# Patient Record
Sex: Male | Born: 1995 | Race: White | Hispanic: No | Marital: Single | Smoking: Current every day smoker
Health system: Southern US, Community
[De-identification: ages and names within clinical notes are randomized; demographics above are authoritative.]

## PROBLEM LIST (undated history)

## (undated) DIAGNOSIS — J329 Chronic sinusitis, unspecified: Secondary | ICD-10-CM

## (undated) DIAGNOSIS — Z8679 Personal history of other diseases of the circulatory system: Secondary | ICD-10-CM

## (undated) DIAGNOSIS — I214 Non-ST elevation (NSTEMI) myocardial infarction: Secondary | ICD-10-CM

## (undated) DIAGNOSIS — J189 Pneumonia, unspecified organism: Secondary | ICD-10-CM

## (undated) DIAGNOSIS — I2690 Septic pulmonary embolism without acute cor pulmonale: Secondary | ICD-10-CM

## (undated) DIAGNOSIS — B1910 Unspecified viral hepatitis B without hepatic coma: Secondary | ICD-10-CM

## (undated) DIAGNOSIS — F419 Anxiety disorder, unspecified: Secondary | ICD-10-CM

## (undated) DIAGNOSIS — I330 Acute and subacute infective endocarditis: Secondary | ICD-10-CM

## (undated) DIAGNOSIS — F1990 Other psychoactive substance use, unspecified, uncomplicated: Secondary | ICD-10-CM

## (undated) DIAGNOSIS — I071 Rheumatic tricuspid insufficiency: Secondary | ICD-10-CM

## (undated) DIAGNOSIS — F1920 Other psychoactive substance dependence, uncomplicated: Secondary | ICD-10-CM

## (undated) HISTORY — DX: Unspecified viral hepatitis B without hepatic coma: B19.10

## (undated) HISTORY — DX: Anxiety disorder, unspecified: F41.9

## (undated) HISTORY — DX: Other psychoactive substance dependence, uncomplicated: F19.20

---
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
## 2013-01-17 ENCOUNTER — Ambulatory Visit (HOSPITAL_COMMUNITY): Payer: Self-pay | Admitting: Psychiatry

## 2013-01-19 ENCOUNTER — Encounter (HOSPITAL_COMMUNITY): Payer: Self-pay

## 2013-04-15 ENCOUNTER — Encounter (HOSPITAL_COMMUNITY): Payer: Self-pay | Admitting: Emergency Medicine

## 2013-04-15 ENCOUNTER — Observation Stay (HOSPITAL_COMMUNITY)
Admission: EM | Admit: 2013-04-15 | Discharge: 2013-04-16 | Payer: 59 | Attending: Family Medicine | Admitting: Family Medicine

## 2013-04-15 DIAGNOSIS — T50901A Poisoning by unspecified drugs, medicaments and biological substances, accidental (unintentional), initial encounter: Secondary | ICD-10-CM | POA: Diagnosis present

## 2013-04-15 DIAGNOSIS — T50911A Poisoning by multiple unspecified drugs, medicaments and biological substances, accidental (unintentional), initial encounter: Secondary | ICD-10-CM

## 2013-04-15 DIAGNOSIS — B1910 Unspecified viral hepatitis B without hepatic coma: Secondary | ICD-10-CM | POA: Insufficient documentation

## 2013-04-15 DIAGNOSIS — F1410 Cocaine abuse, uncomplicated: Secondary | ICD-10-CM | POA: Insufficient documentation

## 2013-04-15 DIAGNOSIS — R092 Respiratory arrest: Secondary | ICD-10-CM | POA: Insufficient documentation

## 2013-04-15 DIAGNOSIS — T405X1A Poisoning by cocaine, accidental (unintentional), initial encounter: Secondary | ICD-10-CM | POA: Insufficient documentation

## 2013-04-15 DIAGNOSIS — N179 Acute kidney failure, unspecified: Secondary | ICD-10-CM | POA: Insufficient documentation

## 2013-04-15 DIAGNOSIS — T43601A Poisoning by unspecified psychostimulants, accidental (unintentional), initial encounter: Secondary | ICD-10-CM | POA: Insufficient documentation

## 2013-04-15 DIAGNOSIS — F1110 Opioid abuse, uncomplicated: Secondary | ICD-10-CM | POA: Insufficient documentation

## 2013-04-15 DIAGNOSIS — T401X4A Poisoning by heroin, undetermined, initial encounter: Principal | ICD-10-CM | POA: Insufficient documentation

## 2013-04-15 DIAGNOSIS — T401X1A Poisoning by heroin, accidental (unintentional), initial encounter: Secondary | ICD-10-CM | POA: Insufficient documentation

## 2013-04-15 DIAGNOSIS — F411 Generalized anxiety disorder: Secondary | ICD-10-CM | POA: Insufficient documentation

## 2013-04-15 LAB — POCT I-STAT, CHEM 8
BUN: 9 mg/dL (ref 6–23)
Calcium, Ion: 1.16 mmol/L (ref 1.12–1.23)
Chloride: 97 meq/L (ref 96–112)
Creatinine, Ser: 1.5 mg/dL — ABNORMAL HIGH (ref 0.50–1.35)
Glucose, Bld: 161 mg/dL — ABNORMAL HIGH (ref 70–99)
HCT: 52 % (ref 39.0–52.0)
Hemoglobin: 17.7 g/dL — ABNORMAL HIGH (ref 13.0–17.0)
Potassium: 4.5 meq/L (ref 3.7–5.3)
Sodium: 137 meq/L (ref 137–147)
TCO2: 26 mmol/L (ref 0–100)

## 2013-04-15 LAB — CBC
HCT: 45.5 % (ref 39.0–52.0)
Hemoglobin: 16.5 g/dL (ref 13.0–17.0)
MCH: 32.5 pg (ref 26.0–34.0)
MCHC: 36.3 g/dL — AB (ref 30.0–36.0)
MCV: 89.6 fL (ref 78.0–100.0)
PLATELETS: 269 10*3/uL (ref 150–400)
RBC: 5.08 MIL/uL (ref 4.22–5.81)
RDW: 12.3 % (ref 11.5–15.5)
WBC: 24.9 10*3/uL — ABNORMAL HIGH (ref 4.0–10.5)

## 2013-04-15 MED ORDER — NALOXONE HCL 0.4 MG/ML IJ SOLN: 0.4000 mg | INTRAMUSCULAR | Status: DC | PRN

## 2013-04-15 MED ORDER — SODIUM CHLORIDE 0.9 % IV SOLN
INTRAVENOUS | Status: DC
Start: 2013-04-15 — End: 2013-04-16
  Administered 2013-04-16 (×2): via INTRAVENOUS

## 2013-04-15 NOTE — ED Notes (Signed)
gpd at the bedside.  His parents are coming back to see him.  He reports that he took 2mg  xanax

## 2013-04-15 NOTE — ED Notes (Signed)
The pt arrived by gems from a hotel room where the pt had overdosed on heroine and cocaine that he quote SHOT UP.  When ems arrived the pt  Had a resp arrest.  He was given narcan iv and the pt  arroused immediately. On arrival here the pt is alert but sleepy. Answers questions.  White material around his mouth ??? ...  The pt reports that he shoots up 2 times a week. He also took a xanax po unknown  Dosage.  He FEELS HIGH.   cbg 313 on the scene

## 2013-04-15 NOTE — ED Provider Notes (Signed)
CSN: [id number]     Arrival date & time 04/15/13  2308 History   First MD Initiated Contact with Patient 04/15/13 2317     Chief Complaint  Patient presents with  . drug overdose    (Consider location/radiation/quality/duration/timing/severity/associated sxs/prior Treatment) HPI Hx per EMS - found unresponsive in hotel room, girlfriend had called 911 after he locked her out.  He was found in resp arrest, given narcan and came to. He admitted to cocaine, heroin injection, xanax and marijuana.  He vomited and now states he still feels high. He declines any SI. He declines any help with drugs. Family bedside.  Past Medical History  Diagnosis Date  . Sinusitis   . Anxiety   . Hepatitis B    No past surgical history on file. History reviewed. No pertinent family history. History  Substance Use Topics  . Smoking status: Current Every Day Smoker -- 0.50 packs/day  . Smokeless tobacco: Never Used  . Alcohol Use: No    Review of Systems  Constitutional: Negative for fever and chills.  Respiratory: Negative for shortness of breath.   Cardiovascular: Negative for chest pain.  Gastrointestinal: Negative for abdominal pain.  Genitourinary: Negative for flank pain.  Musculoskeletal: Negative for back pain.  Skin: Negative for rash.  Neurological: Negative for seizures and weakness.  Psychiatric/Behavioral: Negative for self-injury.  All other systems reviewed and are negative.    Allergies  Penicillins and Sulfa antibiotics  Home Medications   Current Outpatient Rx  Name  Route  Sig  Dispense  Refill  . triamcinolone cream (KENALOG) 0.1 %   Topical   Apply topically 2 (two) times daily.   30 g   0    BP 94/70  Pulse 100  Temp(Src) 99 F (37.2 C) (Oral)  Resp 12  SpO2 99% Physical Exam  Constitutional: He appears well-developed and well-nourished.  HENT:  Head: Normocephalic and atraumatic.  Eyes: EOM are normal. Pupils are equal, round, and reactive to light.  Neck:  Neck supple.  Cardiovascular: Normal rate, regular rhythm and intact distal pulses.   Pulmonary/Chest: Effort normal and breath sounds normal. No respiratory distress.  Abdominal: Soft. There is no tenderness.  Musculoskeletal: Normal range of motion. He exhibits no edema and no tenderness.  Neurological:  Awake, alert, answers questions appropriately, somewhat cooperative  Skin: Skin is warm and dry.    ED Course  Procedures (including critical care time) Labs Review Labs Reviewed  CBC - Abnormal; Notable for the following:    WBC 24.9 (*)    MCHC 36.3 (*)    All other components within normal limits  POCT I-STAT, CHEM 8 - Abnormal; Notable for the following:    Creatinine, Ser 1.50 (*)    Glucose, Bld 161 (*)    Hemoglobin 17.7 (*)    All other components within normal limits  URINE RAPID DRUG SCREEN (HOSP PERFORMED)    EKG Interpretation    Date/Time:  Sunday April 15 2013 23:13:03 EST Ventricular Rate:  112 PR Interval:  149 QRS Duration: 101 QT Interval:  371 QTC Calculation: 506 R Axis:   101 Text Interpretation:  Sinus tachycardia Nonspecific ST abnormality Prolonged QT interval Abnormal ECG No old tracing to compare Confirmed by Nicey Krah  MD, Shamone Winzer ([id number]) on 04/16/2013 12:04:02 AM           IV fluids. Patient remains sleepy, Narcan drip initiated. Medicine consulted - family practice resident evaluated the patient and he is now responsive to verbal stimuli, protecting his  airway. We agree that he no longer needs Narcan drip and can be admitted to step down unit.   MDM  Diagnosis: Drug overdose, polysubstance abuse, ARI  Presented as respiratory arrest reversed with Narcan Evaluated with EKG, labs are reviewed as above. UDS noted. IV fluids provided Admits to SDU   Teressa Lower, MD 04/17/13 [phone]

## 2013-04-16 ENCOUNTER — Encounter (HOSPITAL_COMMUNITY): Payer: Self-pay | Admitting: *Deleted

## 2013-04-16 DIAGNOSIS — T50901A Poisoning by unspecified drugs, medicaments and biological substances, accidental (unintentional), initial encounter: Secondary | ICD-10-CM

## 2013-04-16 LAB — COMPREHENSIVE METABOLIC PANEL
ALBUMIN: 3.7 g/dL (ref 3.5–5.2)
ALT: 62 U/L — ABNORMAL HIGH (ref 0–53)
AST: 67 U/L — ABNORMAL HIGH (ref 0–37)
Alkaline Phosphatase: 56 U/L (ref 39–117)
BILIRUBIN TOTAL: 0.2 mg/dL — AB (ref 0.3–1.2)
BUN: 8 mg/dL (ref 6–23)
CHLORIDE: 101 meq/L (ref 96–112)
CO2: 22 mEq/L (ref 19–32)
CREATININE: 1.15 mg/dL (ref 0.50–1.35)
Calcium: 8 mg/dL — ABNORMAL LOW (ref 8.4–10.5)
GFR calc non Af Amer: >90 mL/min (ref >90)
GLUCOSE: 84 mg/dL (ref 70–99)
Potassium: 3.9 mEq/L (ref 3.7–5.3)
Sodium: 137 mEq/L (ref 137–147)
Total Protein: 6.3 g/dL (ref 6.0–8.3)

## 2013-04-16 LAB — URINALYSIS, ROUTINE W REFLEX MICROSCOPIC
Bilirubin Urine: NEGATIVE
Glucose, UA: 500 mg/dL — AB
Ketones, ur: NEGATIVE mg/dL
Leukocytes, UA: NEGATIVE
NITRITE: NEGATIVE
Protein, ur: NEGATIVE mg/dL
Specific Gravity, Urine: 1.01 (ref 1.005–1.030)
Urobilinogen, UA: 0.2 mg/dL (ref 0.0–1.0)
pH: 6 (ref 5.0–8.0)

## 2013-04-16 LAB — ETHANOL: Alcohol, Ethyl (B): <11 mg/dL (ref 0–11)

## 2013-04-16 LAB — MRSA PCR SCREENING: MRSA by PCR: NEGATIVE

## 2013-04-16 LAB — RAPID URINE DRUG SCREEN, HOSP PERFORMED
AMPHETAMINES: NOT DETECTED
Barbiturates: NOT DETECTED
Benzodiazepines: POSITIVE — AB
COCAINE: POSITIVE — AB
OPIATES: NOT DETECTED
TETRAHYDROCANNABINOL: POSITIVE — AB

## 2013-04-16 LAB — URINE MICROSCOPIC-ADD ON

## 2013-04-16 LAB — BASIC METABOLIC PANEL
BUN: 7 mg/dL (ref 6–23)
CHLORIDE: 108 meq/L (ref 96–112)
CO2: 23 mEq/L (ref 19–32)
Calcium: 8.5 mg/dL (ref 8.4–10.5)
Creatinine, Ser: 1.02 mg/dL (ref 0.50–1.35)
GFR calc Af Amer: >90 mL/min (ref >90)
GFR calc non Af Amer: >90 mL/min (ref >90)
Glucose, Bld: 91 mg/dL (ref 70–99)
Potassium: 4.2 mEq/L (ref 3.7–5.3)
Sodium: 142 mEq/L (ref 137–147)

## 2013-04-16 LAB — CK: CK TOTAL: 288 U/L — AB (ref 7–232)

## 2013-04-16 LAB — ACETAMINOPHEN LEVEL

## 2013-04-16 LAB — SALICYLATE LEVEL: Salicylate Lvl: <2 mg/dL — ABNORMAL LOW (ref 2.8–20.0)

## 2013-04-16 LAB — HIV ANTIBODY (ROUTINE TESTING W REFLEX): HIV: NONREACTIVE

## 2013-04-16 MED ORDER — NALOXONE HCL 0.4 MG/ML IJ SOLN: 0.4000 mg | INTRAMUSCULAR | Status: DC | PRN

## 2013-04-16 MED ORDER — NICOTINE 21 MG/24HR TD PT24
21.0000 mg | MEDICATED_PATCH | Freq: Every day | TRANSDERMAL | Status: DC
  Administered 2013-04-16: 21 mg via TRANSDERMAL
  Filled 2013-04-16: qty 1

## 2013-04-16 MED ORDER — NALOXONE HCL 1 MG/ML IJ SOLN
0.2500 mg/h | INTRAVENOUS | Status: DC
  Administered 2013-04-16: 0.25 mg/h via INTRAVENOUS
  Filled 2013-04-16: qty 4

## 2013-04-16 MED ORDER — FOLIC ACID 1 MG PO TABS
1.0000 mg | ORAL_TABLET | Freq: Every day | ORAL | Status: DC
  Administered 2013-04-16: 1 mg via ORAL
  Filled 2013-04-16: qty 1

## 2013-04-16 MED ORDER — SODIUM CHLORIDE 0.9 % IV BOLUS (SEPSIS)
1000.0000 mL | Freq: Once | INTRAVENOUS | Status: AC
  Administered 2013-04-16: 1000 mL via INTRAVENOUS

## 2013-04-16 MED ORDER — ADULT MULTIVITAMIN W/MINERALS CH
1.0000 | ORAL_TABLET | Freq: Every day | ORAL | Status: DC
  Administered 2013-04-16: 1 via ORAL
  Filled 2013-04-16: qty 1

## 2013-04-16 MED ORDER — HEPARIN SODIUM (PORCINE) 5000 UNIT/ML IJ SOLN
5000.0000 [IU] | Freq: Three times a day (TID) | INTRAMUSCULAR | Status: DC
  Administered 2013-04-16: 5000 [IU] via SUBCUTANEOUS
  Filled 2013-04-16 (×4): qty 1

## 2013-04-16 MED ORDER — THIAMINE HCL 100 MG/ML IJ SOLN
100.0000 mg | Freq: Every day | INTRAMUSCULAR | Status: DC
  Filled 2013-04-16: qty 1

## 2013-04-16 MED ORDER — VITAMIN B-1 100 MG PO TABS
100.0000 mg | ORAL_TABLET | Freq: Every day | ORAL | Status: DC
  Administered 2013-04-16: 100 mg via ORAL
  Filled 2013-04-16: qty 1

## 2013-04-16 NOTE — H&P (Signed)
[facility] Admission History and Physical Service Pager: [phone]  Patient name: Erik [name] Medical record number: [id number] Date of birth: 03-28-1995 Age: 18 y.o. Gender: male  Primary Care Provider: Wendie Agreste, MD Consultants: None  Code Status: Full  Chief Complaint: Respiratory arrest due to drug overdose  Assessment and Plan: Erik [name] is a 18 y.o. male presenting with respiratory arrest due to drug overdose . PMH is significant for hepatitis B and anxiety. Currently the patient is somnolent but responsive to questions and alert and oriented x4. Given his reported respiratory arrest on the same which responded quickly to Narcan I feel the Narcan drip started by the ED is warranted and we will monitor him closely through the night  Drug overdose - Responsive to Narcan at the scene, multiple substances involved including xanax, cocaine, heroin, warm, and oxycodone per his history - Currently somnolent but easily arousable to just questioning and alert oriented x4 - Narcan drip ordered but not started yet, I feel he is safe for continuous pulse ox and cardiac monitoring without Narcan - Found down by EMS for an unknown amount time, will check CK - CIWA for possible alcohol abuse - Also add salicylate, Tylenol, an ethanol level  AKI - Mild, Likely acute kidney injury, however we do not have a baseline creatinine establish her him.   - Patient struggling to urinate currently, bladder scan, i/o cath if needed - Bolus 1 L NS then IV NS at 125 - UA, follow BMP in the am - CK  Leukocytosis - Likely demargination from this acute stress - Will evaluate for infectious source with UA, lung exam within normal limits - Check LFTs with concomitant hepatitis B  Hepatitis B - Last seen by GI in September 2013, they feel it's likely vertical transmission from childbirth - Check LFTs, consider drawing hepatitis labs if elevated - Add HIV   FEN/GI:  NPO for the next 4 hours then regular diet, NS at 125 mL/hr Prophylaxis: Heparin  Disposition: Will admit to step down unit for close observation.  History of Present Illness: Erik [name] is a 18 y.o. male presenting via EMS to the ER after being found in respiratory arrest tone and presumably due to drug overdose. The patient reports that he's not sure what happened is he blacked out. He reports that prior to that he snorted oxycodone/cocaine and then did heroin, after that he blacked out. Per ED notes he locked his girlfriend of their hotel room who called EMS and subsequently found patient unresponsive and in respiratory arrest. At the scene EMS administered Narcan which immediately responded to. Since arriving in the ED he's been arousable consistently. A Narcan drip was initially ordered but then considered not to be necessary due to his easily arousable state.  He states that he uses drugs approximately twice monthly and would like to quit now. He declines any offer of assistance. He denies recent fevers, chills, sweats, chest pain, dyspnea, abdominal pain, dysuria, and headaches.  Review Of Systems: Per HPI, Otherwise 12 point review of systems was performed and was unremarkable.  Patient Active Problem List   Diagnosis Date Noted  . Chronic hepatitis B 12/03/2011   Past Medical History: Past Medical History  Diagnosis Date  . Sinusitis   . Anxiety   . Hepatitis B    Past Surgical History: No past surgical history on file. Social History: History  Substance Use Topics  . Smoking status: Current Every Day Smoker -- 0.50 packs/day  .  Smokeless tobacco: Never Used  . Alcohol Use: No   Additional social history: Please also refer to relevant sections of EMR.  Family History: History reviewed. No pertinent family history. Allergies and Medications: Allergies  Allergen Reactions  . Penicillins   . Sulfa Antibiotics    No current facility-administered medications on file  prior to encounter.   No current outpatient prescriptions on file prior to encounter.    Objective: BP 105/45  Pulse 93  Temp(Src) 99 F (37.2 C) (Oral)  Resp 8  SpO2 100% Exam: Gen: NAD, somnolent, cooperative with exam, answers questions easily HEENT: NCAT, EOMI CV: RRR, good S1/S2, no murmur Resp: CTABL, no wheezes, non-labored Abd: SNTND, BS present, no guarding or organomegaly Ext: No edema, warm Neuro: Alert and oriented x4, strength 5/5 and sensation intact in bilateral lower extremities, moving and using bilateral upper extremities spontaneously, slurred and quiet but understandable speech.   Labs and Imaging: CBC BMET   Recent Labs Lab 04/15/13 2338 04/15/13 2346  WBC 24.9*  --   HGB 16.5 17.7*  HCT 45.5 52.0  PLT 269  --     Recent Labs Lab 04/15/13 2346  NA 137  K 4.5  CL 97  BUN 9  CREATININE 1.50*  GLUCOSE 161*     UDS and UA pending CMP and CK to be collected  EKG 04/15/2013: Sinus tachycardia, computer reads ST elevation diffusely indicating possible pericarditis, however I do not appreciate this.  Timmothy Euler, MD 04/16/2013, 12:44 AM PGY-2, [place] [facility] pager: [phone], text pages welcome

## 2013-04-16 NOTE — Discharge Instructions (Signed)
You were admitted for drug overdose. While in the ambulance and hospital you received Narcan, a medication that reverses opioid/narcotic drugs. We had a Education officer, museum come and see you prior to discharge to offer resources for substance abuse.  We recommend that you stay one more night for observation, however it is within your right to refuse and request to go home. Please schedule a follow up with your PCP.

## 2013-04-16 NOTE — H&P (Signed)
Seen and examined.  Chart reviewed.  Discussed with Dr. Wendi Snipes and agree with his documentation and management.  Briefly 18 yo male with unintentional OD of multiple "recreational" drugs.  Cocaine, benzo, narcotic +?  Today awake and alert.  Expresses regret and is worried that this happened.  Hopefully this will be a wake up call.  BPs are still soft - patient believes he has low blood pressures at baseline.  Father, who I know, informs me that patient will be transported to jail upon hospital discharge.  Given low blood pressures and DC to a non controled environment, I favor observe overnight.  Likely DC in am 2/10.  Social worker to offer drug treatment resources.

## 2013-04-16 NOTE — Progress Notes (Signed)
UR completed 

## 2013-04-16 NOTE — Progress Notes (Addendum)
Discharge instructions given to patient and family member.  Pt verbalized understanding.  Due to previous circumstances, pt discharged from hospital to police custody.  Father present at bedside and left with patient's belongings.   Vista Lawman, RN

## 2013-04-16 NOTE — Discharge Summary (Signed)
[facility] Discharge Summary  Patient name: Erik [name] Medical record number: [id number] Date of birth: 1995-03-17 Age: 18 y.o. Gender: male Date of Admission: 04/15/2013  Date of Discharge: 04/16/2013 Admitting Physician: Zigmund Gottron, MD  Primary Care Provider: Wendie Agreste, MD Consultants: none  Indication for Hospitalization: Respiratory arrest due to drug overdose  Discharge Diagnoses/Problem List:  Drug overdose AKI Hepatitis B Anxiety Substance abuse  Disposition: home  Discharge Condition: improved, stable  Brief Hospital Course:  Erik [name] is a 18 y.o. male that was brought in by EMS for respiratory arrest responsive to narcan. On arrival to hospital patient was somnolent but arousable, alert and oriented. Narcan drip was initially ordered but deemed not needed after evaluation. Initial workup with AKI (Cr 1.5) and UDS positive for cocaine, benzodiazepines, THC, ethanol level <11 mg/dL; patient also admitted to heroine use however UDS was negative for opiates. He was given IVF and monitored overnight without issues. In the afternoon patient felt he was recovered and requested discharge. Medical team advised him that medical opinion was to observe him again overnight, however patient continued to refuse and he was discharged in stable condition.  Issues for Follow Up:  1. Substance abuse: patient refused offers from SW for resources 2. Hepatitis B: LFTs within normal limits, last seen by GI 11/2011.  Significant Procedures: none  Significant Labs and Imaging:   Recent Labs Lab 04/15/13 2338 04/15/13 2346  WBC 24.9*  --   HGB 16.5 17.7*  HCT 45.5 52.0  PLT 269  --     Recent Labs Lab 04/15/13 2346 04/16/13 0223 04/16/13 1142  NA 137 137 142  K 4.5 3.9 4.2  CL 97 101 108  CO2  --  22 23  GLUCOSE 161* 84 91  BUN 9 8 7   CREATININE 1.50* 1.15 1.02  CALCIUM  --  8.0* 8.5  ALKPHOS  --  56  --   AST  --  67*  --    ALT  --  62*  --   ALBUMIN  --  3.7  --    HIV antibobdy: non-reactive CK: 782 Ethanol <95 Salicylate level <6.2 Acetaminophen level <15.0  Results/Tests Pending at Time of Discharge: none  Discharge Medications:    Medication List    Notice   You have not been prescribed any medications.      Discharge Instructions: Please refer to Patient Instructions section of EMR for full details.  Patient was counseled important signs and symptoms that should prompt return to medical care, changes in medications, dietary instructions, activity restrictions, and follow up appointments.   Follow-Up Appointments:     Follow-up Information   Schedule an appointment as soon as possible for a visit with Wendie Agreste, MD. (call for appointment)    Specialties:  Family Medicine, Sports Medicine   Contact information:   [address] [place] [address] [phone]       Tawanna Sat, MD 04/16/2013, 2:43 PM PGY-1, [place]

## 2013-04-16 NOTE — Progress Notes (Signed)
Clinical Social Work Department BRIEF PSYCHOSOCIAL ASSESSMENT 04/16/2013  Patient:  Erik [name], Erik [name]     Account Number:  [id number]     Admit date:  04/15/2013  Clinical Social Worker:  Megan Salon  Date/Time:  04/16/2013 01:50 PM  Referred by:  Physician  Date Referred:  04/16/2013 Referred for  Substance Abuse   Other Referral:   Interview type:  Patient Other interview type:    PSYCHOSOCIAL DATA Living Status:  FAMILY Admitted from facility:   Level of care:   Primary support name:  Verl Dicker Primary support relationship to patient:  PARENT Degree of support available:   Supportive parents    CURRENT CONCERNS Current Concerns  Substance Abuse   Other Concerns:    SOCIAL WORK ASSESSMENT / PLAN CSW received referal from MD to speak to patient about drug use. CSW went into room and introduced self to patient and explained reason for visit. Patient appeared open to speaking with Education officer, museum. Patient reports that he and his girlfriend got into an arguement, he was depressed and decided "to shoot up heroin and before he knew it woke up on the ambulance." Patient stated he does not usually do heroin often, but does weed often. CSW asked patient if he was planning on quitting and patient stated "he is going to cut back on the weed and stop heroin. CSW provided outpatient resources to patient and patient stated he does not want to go rehab "because the last rehab place screwed him up worse." CSW provided empathetic listening and emotional support to patient. CSW explained to patient that counseling would be an option, rather than rehab. Patient appeared open to the option of counseling and said he would be interested in that. CSW provided resources for counseling. Patient than asked social worker if he could leave the hospital yet. CSW explained she will notify the RN that patient and social worker met. Patient reported he is unsure if he has to go to jail, but stated "I am  scared, I've never been to jail." CSW provided support for patient at this time.   Assessment/plan status:  Information/Referral to [employer] Other assessment/ plan:   Information/referral to community resources:   Substance Use resources/counseling    PATIENT'S/FAMILY'S RESPONSE TO PLAN OF CARE: Patient reported he is planning on cutting back and is ready to leave the hospital. CSW signing off at this time. Please re consult if social work needs arise.       Jeanette Caprice, MSW, [place]

## 2013-04-16 NOTE — Progress Notes (Signed)
Spoke with patient regarding discharge home.  Advised continued observation overnight to monitor for potential delayed adverse reaction/effect. Patient declined stating that he is feeling well and is ready to go home.  Patient to be discharged today.

## 2013-04-16 NOTE — ED Notes (Signed)
Pt sleeping unless arroused.  He responds to little stimuli

## 2013-09-19 ENCOUNTER — Encounter (HOSPITAL_COMMUNITY): Payer: Self-pay | Admitting: Emergency Medicine

## 2013-09-19 ENCOUNTER — Emergency Department (INDEPENDENT_AMBULATORY_CARE_PROVIDER_SITE_OTHER): Payer: 59

## 2013-09-19 ENCOUNTER — Emergency Department (INDEPENDENT_AMBULATORY_CARE_PROVIDER_SITE_OTHER)
Admission: EM | Admit: 2013-09-19 | Discharge: 2013-09-19 | Disposition: A | Discharge status: Home / Self Care | Payer: 59 | Source: Home / Self Care | Attending: Family Medicine | Admitting: Family Medicine

## 2013-09-19 DIAGNOSIS — S81809A Unspecified open wound, unspecified lower leg, initial encounter: Secondary | ICD-10-CM

## 2013-09-19 DIAGNOSIS — S81811A Laceration without foreign body, right lower leg, initial encounter: Secondary | ICD-10-CM

## 2013-09-19 DIAGNOSIS — Y9351 Activity, roller skating (inline) and skateboarding: Secondary | ICD-10-CM

## 2013-09-19 DIAGNOSIS — W458XXA Other foreign body or object entering through skin, initial encounter: Secondary | ICD-10-CM

## 2013-09-19 DIAGNOSIS — T148XXA Other injury of unspecified body region, initial encounter: Secondary | ICD-10-CM

## 2013-09-19 MED ORDER — DICLOFENAC SODIUM 50 MG PO TBEC: 50.0000 mg | DELAYED_RELEASE_TABLET | Freq: Two times a day (BID) | ORAL | Status: DC | PRN

## 2013-09-19 MED ORDER — DOXYCYCLINE HYCLATE 100 MG PO CAPS: 100.0000 mg | ORAL_CAPSULE | Freq: Two times a day (BID) | ORAL | Status: DC

## 2013-09-19 NOTE — ED Provider Notes (Signed)
Erik [name] is a 18 y.o. male who presents to Urgent Care today for right leg abrasion laceration and foreign body.  Patient was skateboarding today when he slid onto a piece of lead. A long section splinter became embedded in his right lateral superficial thigh. He removed 3 large pieces of wood and is here today for evaluation. He notes mild pain and tenderness but feels well otherwise. No fevers or chills nausea vomiting or diarrhea. He denies any other injury   Past Medical History  Diagnosis Date  . Sinusitis   . Anxiety   . Hepatitis B    History  Substance Use Topics  . Smoking status: Current Every Day Smoker -- 0.50 packs/day  . Smokeless tobacco: Never Used  . Alcohol Use: No   ROS as above Medications: No current facility-administered medications for this encounter.   Current Outpatient Prescriptions  Medication Sig Dispense Refill  . diclofenac (VOLTAREN) 50 MG EC tablet Take 1 tablet (50 mg total) by mouth 2 (two) times daily as needed.  30 tablet  0  . doxycycline (VIBRAMYCIN) 100 MG capsule Take 1 capsule (100 mg total) by mouth 2 (two) times daily.  14 capsule  0    Exam:  BP 121/73  Pulse 81  Temp(Src) 99.1 F (37.3 C) (Oral)  Resp 20  SpO2 99% Gen: Well NAD  Right leg: Abrasion and laceration of the right lateral leg. There is area of tenderness approximately 2 cm distal into the wound. Small other superficial abrasions in the same area are present..    Attempt at removal of foreign body:  Consent obtained and timeout performed.  Area cleaned with alcohol and 1 mL of lidocaine with epinephrine was injected into the area of tenderness.  A thin mosquito forceps was used to attempt to probe the area. No wood was palpable. The area was again cleaned with alcohol and a 15 blade scalpel was used to cut down to the wound pocket. No foreign body visualized approximately total length of cut was about 1 cm in depth was about 3 mm.  The wound was copiously irrigated  with sterile saline The skin was prepped with Betadine and 2 simple interrupted sutures were used to approximate the wound edges. 4-0 Prolene was used  Patient tolerated procedure well   No results found for this or any previous visit (from the past 24 hour(s)). Dg Femur Right  09/19/2013   CLINICAL DATA:  Multiple splinters removed, wounds, question retained foreign bodies  EXAM: RIGHT FEMUR - 2 VIEW  COMPARISON:  None  FINDINGS: Osseous mineralization normal.  Joint spaces preserved.  BB placed at a wound at the distal RIGHT thigh.  No acute fracture, dislocation or bone destruction.  No definite radiopaque foreign bodies identified.  IMPRESSION: No definite radiopaque foreign bodies.   Electronically Signed   By: Lavonia Dana M.D.   On: 09/19/2013 15:57    Assessment and Plan: 18 y.o. male with laceration. No retained foreign bodies. Empiric doxycycline antibiotic (patient is penicillin and sulfa allergic)  Diclofenac for pain control.  Patient requests Fentyl patch for pain control however he has a history of opiate overdose and abuse will not provide opiates at this visit.  Discussed warning signs or symptoms. Please see discharge instructions. Patient expresses understanding.    Gregor Hams, MD 09/19/13 [phone]

## 2013-09-19 NOTE — Discharge Instructions (Signed)
Thank you for coming in today. You can use diclofenac twice daily for pain as needed Take doxycycline twice daily as antibiotics for one week Return in one week or followup with your primary care doctor to remove stitches Return sooner if worsening or pus.   Abrasion An abrasion is a cut or scrape of the skin. Abrasions do not extend through all layers of the skin and most heal within 10 days. It is important to care for your abrasion properly to prevent infection. CAUSES  Most abrasions are caused by falling on, or gliding across, the ground or other surface. When your skin rubs on something, the outer and inner layer of skin rubs off, causing an abrasion. DIAGNOSIS  Your caregiver will be able to diagnose an abrasion during a physical exam.  TREATMENT  Your treatment depends on how large and deep the abrasion is. Generally, your abrasion will be cleaned with water and a mild soap to remove any dirt or debris. An antibiotic ointment may be put over the abrasion to prevent an infection. A bandage (dressing) may be wrapped around the abrasion to keep it from getting dirty.  You may need a tetanus shot if:  You cannot remember when you had your last tetanus shot.  You have never had a tetanus shot.  The injury broke your skin. If you get a tetanus shot, your arm may swell, get red, and feel warm to the touch. This is common and not a problem. If you need a tetanus shot and you choose not to have one, there is a rare chance of getting tetanus. Sickness from tetanus can be serious.  HOME CARE INSTRUCTIONS   If a dressing was applied, change it at least once a day or as directed by your caregiver. If the bandage sticks, soak it off with warm water.   Wash the area with water and a mild soap to remove all the ointment 2 times a day. Rinse off the soap and pat the area dry with a clean towel.   Reapply any ointment as directed by your caregiver. This will help prevent infection and keep the  bandage from sticking. Use gauze over the wound and under the dressing to help keep the bandage from sticking.   Change your dressing right away if it becomes wet or dirty.   Only take over-the-counter or prescription medicines for pain, discomfort, or fever as directed by your caregiver.   Follow up with your caregiver within 24-48 hours for a wound check, or as directed. If you were not given a wound-check appointment, look closely at your abrasion for redness, swelling, or pus. These are signs of infection. SEEK IMMEDIATE MEDICAL CARE IF:   You have increasing pain in the wound.   You have redness, swelling, or tenderness around the wound.   You have pus coming from the wound.   You have a fever or persistent symptoms for more than 2-3 days.  You have a fever and your symptoms suddenly get worse.  You have a bad smell coming from the wound or dressing.  MAKE SURE YOU:   Understand these instructions.  Will watch your condition.  Will get help right away if you are not doing well or get worse. Document Released: 12/02/2004 Document Revised: 02/09/2012 Document Reviewed: 01/26/2011 [facility] Patient Information 2015 [place], [place]. This information is not intended to replace advice given to you by your health care provider. Make sure you discuss any questions you have with your health care  provider.  Wood Splinters Wood splinters need to be removed because they can cause skin irritation and infection. If they are close to the surface, splinters can usually be removed easily. Deep splinters may be hard to locate and need treatment by a surgeon. SPLINTER REMOVAL Removal of splinters by your caregiver is considered a surgical procedure.   The area is carefully cleaned. You may require a small amount of anaesthesia (medicine injected near the splinter to numb the tissue and lessen pain). After the splinter is removed, the area will be cleaned again. A bandage is applied.  If  your splinter is under a fingernail or toenail, then a small section of the nail may need to be removed. As long as the splinter did not extend to the base of the nail, the nail usually grows back normally.  A splinter that is deeper, more contaminated, or that gets near a structure such as a bone, nerve or blood vessel may need to be removed by a Psychologist, sport and exercise.  You may need special X-rays or scans if the splinter is hard to locate.  Every attempt is made to remove the entire splinter. However, small particles may remain. Tell your caregiver if you feel that a part of the splinter was left behind. HOME CARE INSTRUCTIONS   Keep the injured area high up (elevated).  Use the injured area as little as possible.  Keep the injured area clean and dry. Follow any directions from your caregiver.  Keep any follow-up or wound check appointments. You might need a tetanus shot now if:  You have no idea when you had the last one.  You have never had a tetanus shot before.  The injured area had dirt in it. Even if you have already removed the splinter, call your caregiver to get a tetanus shot if you need one.  If you need a tetanus shot, and you decide not to get one, there is a rare chance of getting tetanus. Sickness from tetanus can be serious. If you did get a tetanus shot, your arm may swell, get red and warm to the touch at the shot site. This is common and not a problem. SEEK MEDICAL CARE IF:   A splinter has been removed, but you are not better in a day or two.  You develop a temperature.  Signs of infection develop such as:  Redness, swelling or pus around the wound.  Red streaks spreading back from your wound towards your body. Document Released: 04/01/2004 Document Revised: 05/17/2011 Document Reviewed: 03/04/2008 [facility] Patient Information 2015 [place], [place]. This information is not intended to replace advice given to you by your health care provider. Make sure you discuss any  questions you have with your health care provider.

## 2013-09-19 NOTE — ED Notes (Signed)
C/o right leg injury due to falling off skateboard States a tree branch got stuck in his leg Patient states he pulled out three pieces of the branch but feels as if there is more in there.

## 2013-11-16 ENCOUNTER — Inpatient Hospital Stay (HOSPITAL_COMMUNITY)
Admission: AD | Admit: 2013-11-16 | Discharge: 2013-11-20 | DRG: 897 | Disposition: A | Discharge status: Home / Self Care | Payer: 59 | Source: Intra-hospital | Attending: Psychiatry | Admitting: Psychiatry

## 2013-11-16 ENCOUNTER — Encounter (HOSPITAL_COMMUNITY): Payer: Self-pay | Admitting: Emergency Medicine

## 2013-11-16 ENCOUNTER — Ambulatory Visit (HOSPITAL_COMMUNITY)
Admission: RE | Admit: 2013-11-16 | Discharge: 2013-11-16 | Disposition: A | Discharge status: Home / Self Care | Payer: 59 | Source: Home / Self Care | Attending: Psychiatry | Admitting: Psychiatry

## 2013-11-16 ENCOUNTER — Emergency Department (HOSPITAL_COMMUNITY)
Admission: EM | Admit: 2013-11-16 | Discharge: 2013-11-16 | Disposition: A | Discharge status: Other Acute Inpatient Hospital | Payer: 59 | Attending: Emergency Medicine | Admitting: Emergency Medicine

## 2013-11-16 DIAGNOSIS — F1210 Cannabis abuse, uncomplicated: Secondary | ICD-10-CM | POA: Diagnosis present

## 2013-11-16 DIAGNOSIS — Z8709 Personal history of other diseases of the respiratory system: Secondary | ICD-10-CM | POA: Diagnosis not present

## 2013-11-16 DIAGNOSIS — F39 Unspecified mood [affective] disorder: Secondary | ICD-10-CM

## 2013-11-16 DIAGNOSIS — F1410 Cocaine abuse, uncomplicated: Secondary | ICD-10-CM | POA: Diagnosis present

## 2013-11-16 DIAGNOSIS — F1520 Other stimulant dependence, uncomplicated: Secondary | ICD-10-CM | POA: Diagnosis not present

## 2013-11-16 DIAGNOSIS — F1420 Cocaine dependence, uncomplicated: Secondary | ICD-10-CM | POA: Insufficient documentation

## 2013-11-16 DIAGNOSIS — F332 Major depressive disorder, recurrent severe without psychotic features: Secondary | ICD-10-CM | POA: Diagnosis present

## 2013-11-16 DIAGNOSIS — Z8619 Personal history of other infectious and parasitic diseases: Secondary | ICD-10-CM | POA: Diagnosis not present

## 2013-11-16 DIAGNOSIS — F1994 Other psychoactive substance use, unspecified with psychoactive substance-induced mood disorder: Secondary | ICD-10-CM | POA: Diagnosis present

## 2013-11-16 DIAGNOSIS — Z598 Other problems related to housing and economic circumstances: Secondary | ICD-10-CM

## 2013-11-16 DIAGNOSIS — Z5987 Material hardship due to limited financial resources, not elsewhere classified: Secondary | ICD-10-CM

## 2013-11-16 DIAGNOSIS — F1310 Sedative, hypnotic or anxiolytic abuse, uncomplicated: Secondary | ICD-10-CM | POA: Diagnosis present

## 2013-11-16 DIAGNOSIS — F1320 Sedative, hypnotic or anxiolytic dependence, uncomplicated: Secondary | ICD-10-CM | POA: Diagnosis present

## 2013-11-16 DIAGNOSIS — Z880 Allergy status to penicillin: Secondary | ICD-10-CM | POA: Diagnosis not present

## 2013-11-16 DIAGNOSIS — Z559 Problems related to education and literacy, unspecified: Secondary | ICD-10-CM | POA: Diagnosis not present

## 2013-11-16 DIAGNOSIS — F17200 Nicotine dependence, unspecified, uncomplicated: Secondary | ICD-10-CM | POA: Insufficient documentation

## 2013-11-16 DIAGNOSIS — F1220 Cannabis dependence, uncomplicated: Secondary | ICD-10-CM | POA: Insufficient documentation

## 2013-11-16 DIAGNOSIS — Z609 Problem related to social environment, unspecified: Secondary | ICD-10-CM | POA: Diagnosis not present

## 2013-11-16 DIAGNOSIS — F411 Generalized anxiety disorder: Secondary | ICD-10-CM | POA: Diagnosis present

## 2013-11-16 DIAGNOSIS — R45851 Suicidal ideations: Secondary | ICD-10-CM | POA: Diagnosis not present

## 2013-11-16 DIAGNOSIS — F1920 Other psychoactive substance dependence, uncomplicated: Secondary | ICD-10-CM | POA: Diagnosis present

## 2013-11-16 DIAGNOSIS — G4700 Insomnia, unspecified: Secondary | ICD-10-CM | POA: Diagnosis present

## 2013-11-16 DIAGNOSIS — Z5989 Other problems related to housing and economic circumstances: Secondary | ICD-10-CM | POA: Diagnosis not present

## 2013-11-16 LAB — CBC WITH DIFFERENTIAL/PLATELET
BASOS PCT: 0 % (ref 0–1)
Basophils Absolute: 0 10*3/uL (ref 0.0–0.1)
Eosinophils Absolute: 0.1 10*3/uL (ref 0.0–0.7)
Eosinophils Relative: 1 % (ref 0–5)
HCT: 52.8 % — ABNORMAL HIGH (ref 39.0–52.0)
HEMOGLOBIN: 18.1 g/dL — AB (ref 13.0–17.0)
LYMPHS PCT: 27 % (ref 12–46)
Lymphs Abs: 1.6 10*3/uL (ref 0.7–4.0)
MCH: 30.8 pg (ref 26.0–34.0)
MCHC: 34.3 g/dL (ref 30.0–36.0)
MCV: 89.9 fL (ref 78.0–100.0)
MONOS PCT: 11 % (ref 3–12)
Monocytes Absolute: 0.7 10*3/uL (ref 0.1–1.0)
NEUTROS PCT: 61 % (ref 43–77)
Neutro Abs: 3.8 10*3/uL (ref 1.7–7.7)
Platelets: 246 10*3/uL (ref 150–400)
RBC: 5.87 MIL/uL — AB (ref 4.22–5.81)
RDW: 12.2 % (ref 11.5–15.5)
WBC: 6.2 10*3/uL (ref 4.0–10.5)

## 2013-11-16 LAB — BASIC METABOLIC PANEL
ANION GAP: 15 (ref 5–15)
BUN: 13 mg/dL (ref 6–23)
CHLORIDE: 100 meq/L (ref 96–112)
CO2: 24 mEq/L (ref 19–32)
Calcium: 10.5 mg/dL (ref 8.4–10.5)
Creatinine, Ser: 0.83 mg/dL (ref 0.50–1.35)
GFR calc non Af Amer: >90 mL/min (ref >90)
Glucose, Bld: 120 mg/dL — ABNORMAL HIGH (ref 70–99)
POTASSIUM: 4.3 meq/L (ref 3.7–5.3)
Sodium: 139 mEq/L (ref 137–147)

## 2013-11-16 LAB — RAPID URINE DRUG SCREEN, HOSP PERFORMED
Amphetamines: POSITIVE — AB
BENZODIAZEPINES: POSITIVE — AB
Barbiturates: NOT DETECTED
Cocaine: POSITIVE — AB
Opiates: NOT DETECTED
Tetrahydrocannabinol: POSITIVE — AB

## 2013-11-16 LAB — ETHANOL

## 2013-11-16 LAB — SALICYLATE LEVEL: Salicylate Lvl: <2 mg/dL — ABNORMAL LOW (ref 2.8–20.0)

## 2013-11-16 LAB — ACETAMINOPHEN LEVEL: Acetaminophen (Tylenol), Serum: <15 ug/mL (ref 10–30)

## 2013-11-16 MED ORDER — ONDANSETRON 4 MG PO TBDP: 4.0000 mg | ORAL_TABLET | Freq: Four times a day (QID) | ORAL | Status: DC | PRN

## 2013-11-16 MED ORDER — LORAZEPAM 1 MG PO TABS
1.0000 mg | ORAL_TABLET | Freq: Three times a day (TID) | ORAL | Status: DC | PRN
  Administered 2013-11-17 – 2013-11-19 (×4): 1 mg via ORAL
  Filled 2013-11-16 (×4): qty 1

## 2013-11-16 MED ORDER — HYDROXYZINE HCL 25 MG PO TABS: 25.0000 mg | ORAL_TABLET | Freq: Four times a day (QID) | ORAL | Status: DC | PRN

## 2013-11-16 MED ORDER — NICOTINE 21 MG/24HR TD PT24
21.0000 mg | MEDICATED_PATCH | Freq: Every day | TRANSDERMAL | Status: DC
  Administered 2013-11-16: 21 mg via TRANSDERMAL
  Filled 2013-11-16: qty 1

## 2013-11-16 MED ORDER — CHLORDIAZEPOXIDE HCL 25 MG PO CAPS
25.0000 mg | ORAL_CAPSULE | Freq: Three times a day (TID) | ORAL | Status: AC
  Administered 2013-11-18 (×2): 25 mg via ORAL
  Filled 2013-11-16 (×2): qty 1

## 2013-11-16 MED ORDER — CHLORDIAZEPOXIDE HCL 25 MG PO CAPS: 25.0000 mg | ORAL_CAPSULE | ORAL | Status: DC

## 2013-11-16 MED ORDER — LOPERAMIDE HCL 2 MG PO CAPS: 2.0000 mg | ORAL_CAPSULE | ORAL | Status: DC | PRN

## 2013-11-16 MED ORDER — CLONIDINE HCL 0.1 MG PO TABS: 0.1000 mg | ORAL_TABLET | Freq: Every day | ORAL | Status: DC

## 2013-11-16 MED ORDER — CLONIDINE HCL 0.1 MG PO TABS
0.1000 mg | ORAL_TABLET | Freq: Four times a day (QID) | ORAL | Status: AC
  Administered 2013-11-17 – 2013-11-18 (×5): 0.1 mg via ORAL
  Filled 2013-11-16 (×10): qty 1

## 2013-11-16 MED ORDER — ALUM & MAG HYDROXIDE-SIMETH 200-200-20 MG/5ML PO SUSP: 30.0000 mL | ORAL | Status: DC | PRN

## 2013-11-16 MED ORDER — NICOTINE 21 MG/24HR TD PT24
21.0000 mg | MEDICATED_PATCH | Freq: Every day | TRANSDERMAL | Status: DC
  Administered 2013-11-17 – 2013-11-19 (×3): 21 mg via TRANSDERMAL
  Filled 2013-11-16 (×7): qty 1

## 2013-11-16 MED ORDER — CHLORDIAZEPOXIDE HCL 25 MG PO CAPS: 25.0000 mg | ORAL_CAPSULE | Freq: Three times a day (TID) | ORAL | Status: DC

## 2013-11-16 MED ORDER — DICYCLOMINE HCL 20 MG PO TABS: 20.0000 mg | ORAL_TABLET | Freq: Four times a day (QID) | ORAL | Status: DC | PRN

## 2013-11-16 MED ORDER — MAGNESIUM HYDROXIDE 400 MG/5ML PO SUSP: 30.0000 mL | Freq: Every day | ORAL | Status: DC | PRN

## 2013-11-16 MED ORDER — ADULT MULTIVITAMIN W/MINERALS CH
1.0000 | ORAL_TABLET | Freq: Every day | ORAL | Status: DC
  Administered 2013-11-16: 1 via ORAL
  Filled 2013-11-16: qty 1

## 2013-11-16 MED ORDER — ACETAMINOPHEN 325 MG PO TABS
650.0000 mg | ORAL_TABLET | Freq: Four times a day (QID) | ORAL | Status: DC | PRN
  Administered 2013-11-17: 650 mg via ORAL
  Filled 2013-11-16: qty 2

## 2013-11-16 MED ORDER — LORAZEPAM 1 MG PO TABS: 1.0000 mg | ORAL_TABLET | Freq: Three times a day (TID) | ORAL | Status: DC | PRN

## 2013-11-16 MED ORDER — ADULT MULTIVITAMIN W/MINERALS CH
1.0000 | ORAL_TABLET | Freq: Every day | ORAL | Status: DC
  Administered 2013-11-17 – 2013-11-18 (×2): 1 via ORAL
  Filled 2013-11-16 (×6): qty 1

## 2013-11-16 MED ORDER — THIAMINE HCL 100 MG/ML IJ SOLN: 100.0000 mg | Freq: Once | INTRAMUSCULAR | Status: DC

## 2013-11-16 MED ORDER — HYDROXYZINE HCL 25 MG PO TABS
25.0000 mg | ORAL_TABLET | Freq: Four times a day (QID) | ORAL | Status: DC | PRN
  Administered 2013-11-17: 25 mg via ORAL
  Filled 2013-11-16 (×2): qty 1

## 2013-11-16 MED ORDER — LOPERAMIDE HCL 2 MG PO CAPS
2.0000 mg | ORAL_CAPSULE | ORAL | Status: DC | PRN
Start: 2013-11-16 — End: 2013-11-16

## 2013-11-16 MED ORDER — NAPROXEN 500 MG PO TABS: 500.0000 mg | ORAL_TABLET | Freq: Two times a day (BID) | ORAL | Status: DC | PRN

## 2013-11-16 MED ORDER — CLONIDINE HCL 0.1 MG PO TABS
0.1000 mg | ORAL_TABLET | Freq: Four times a day (QID) | ORAL | Status: DC
  Administered 2013-11-16 (×2): 0.1 mg via ORAL
  Filled 2013-11-16 (×2): qty 1

## 2013-11-16 MED ORDER — CHLORDIAZEPOXIDE HCL 25 MG PO CAPS: 25.0000 mg | ORAL_CAPSULE | Freq: Every day | ORAL | Status: DC

## 2013-11-16 MED ORDER — CHLORDIAZEPOXIDE HCL 25 MG PO CAPS
25.0000 mg | ORAL_CAPSULE | Freq: Four times a day (QID) | ORAL | Status: DC
  Administered 2013-11-16: 25 mg via ORAL
  Filled 2013-11-16: qty 1

## 2013-11-16 MED ORDER — VITAMIN B-1 100 MG PO TABS: 100.0000 mg | ORAL_TABLET | Freq: Every day | ORAL | Status: DC

## 2013-11-16 MED ORDER — CHLORDIAZEPOXIDE HCL 25 MG PO CAPS
25.0000 mg | ORAL_CAPSULE | Freq: Four times a day (QID) | ORAL | Status: AC | PRN
  Administered 2013-11-18: 25 mg via ORAL
  Filled 2013-11-16: qty 1

## 2013-11-16 MED ORDER — METHOCARBAMOL 500 MG PO TABS
500.0000 mg | ORAL_TABLET | Freq: Three times a day (TID) | ORAL | Status: DC | PRN
  Administered 2013-11-17 – 2013-11-18 (×3): 500 mg via ORAL
  Filled 2013-11-16 (×3): qty 1

## 2013-11-16 MED ORDER — CHLORDIAZEPOXIDE HCL 25 MG PO CAPS: 25.0000 mg | ORAL_CAPSULE | Freq: Four times a day (QID) | ORAL | Status: DC | PRN

## 2013-11-16 MED ORDER — CHLORDIAZEPOXIDE HCL 25 MG PO CAPS
25.0000 mg | ORAL_CAPSULE | Freq: Four times a day (QID) | ORAL | Status: AC
  Administered 2013-11-17 – 2013-11-18 (×4): 25 mg via ORAL
  Filled 2013-11-16 (×4): qty 1

## 2013-11-16 MED ORDER — DICYCLOMINE HCL 20 MG PO TABS
20.0000 mg | ORAL_TABLET | Freq: Four times a day (QID) | ORAL | Status: DC | PRN
  Administered 2013-11-17 – 2013-11-18 (×2): 20 mg via ORAL
  Filled 2013-11-16 (×2): qty 1

## 2013-11-16 MED ORDER — CLONIDINE HCL 0.1 MG PO TABS: 0.1000 mg | ORAL_TABLET | ORAL | Status: DC

## 2013-11-16 MED ORDER — CLONIDINE HCL 0.1 MG PO TABS
0.1000 mg | ORAL_TABLET | ORAL | Status: DC
  Administered 2013-11-19: 0.1 mg via ORAL
  Filled 2013-11-16 (×5): qty 1

## 2013-11-16 MED ORDER — CLONIDINE HCL 0.1 MG PO TABS
0.1000 mg | ORAL_TABLET | Freq: Every day | ORAL | Status: DC
  Filled 2013-11-16: qty 1

## 2013-11-16 MED ORDER — METHOCARBAMOL 500 MG PO TABS: 500.0000 mg | ORAL_TABLET | Freq: Three times a day (TID) | ORAL | Status: DC | PRN

## 2013-11-16 MED ORDER — NAPROXEN 500 MG PO TABS
500.0000 mg | ORAL_TABLET | Freq: Two times a day (BID) | ORAL | Status: DC | PRN
  Administered 2013-11-16: 500 mg via ORAL
  Filled 2013-11-16: qty 1

## 2013-11-16 NOTE — ED Notes (Signed)
Patient is desiring detox and subsequent treatment for polysubstance abuse.  He is denying SI currently and is cooperative.

## 2013-11-16 NOTE — ED Provider Notes (Signed)
CSN: [id number]     Arrival date & time 11/16/13  1412 History  This chart was scribed for Montine Circle, PA, working with Pamella Pert, MD found by Starleen Arms, ED Scribe. This patient was seen in room WTR5/WTR5 and the patient's care was started at 3:05 PM.'   Chief Complaint  Patient presents with  . detox    The history is provided by the patient. No language interpreter was used.    HPI Comments: Erik [name] is a 18 y.o. male who presents to the Emergency Department in need of medical clearance.  Patient was seen at [facility] with the intention of receiving addiction treatment and was told to go to the ED to receive medical clearance.  Patient reports he has been using marijuana, xanax, cocaine, heroin, MDMA intermittently for several years.  He reports his last use was yesterday when he did cocaine.  Patient reports that he has the "shakes" and "foggyheadedness" currently.  Patient has had thoughts of committing suicide and has vague notions of a plan that includes making his friends think that he has moved away to [place] when in reality he has "blown his brains out."  Patient denies alcohol use.  Past Medical History  Diagnosis Date  . Sinusitis   . Anxiety   . Hepatitis B    History reviewed. No pertinent past surgical history. No family history on file. History  Substance Use Topics  . Smoking status: Current Every Day Smoker -- 0.50 packs/day    Types: Cigarettes  . Smokeless tobacco: Never Used  . Alcohol Use: Yes    Review of Systems  Constitutional: Negative for fever and chills.  Respiratory: Negative for shortness of breath.   Cardiovascular: Negative for chest pain.  Gastrointestinal: Negative for nausea, vomiting, diarrhea and constipation.  Genitourinary: Negative for dysuria.  Psychiatric/Behavioral: Positive for suicidal ideas.  All other systems reviewed and are negative.     Allergies  Penicillins and Sulfa antibiotics  Home  Medications   Prior to Admission medications   Not on File   BP 154/96  Pulse 76  Temp(Src) 98.6 F (37 C) (Oral)  Resp 20  SpO2 100% Physical Exam  Nursing note and vitals reviewed. Constitutional: He is oriented to person, place, and time. He appears well-developed and well-nourished. No distress.  HENT:  Head: Normocephalic and atraumatic.  Eyes: Conjunctivae and EOM are normal. Pupils are equal, round, and reactive to light. Right eye exhibits no discharge. Left eye exhibits no discharge. No scleral icterus.  Neck: Normal range of motion. Neck supple. No JVD present. No tracheal deviation present.  Cardiovascular: Normal rate, regular rhythm and normal heart sounds.  Exam reveals no gallop and no friction rub.   No murmur heard. Pulmonary/Chest: Effort normal and breath sounds normal. No respiratory distress. He has no wheezes. He has no rales. He exhibits no tenderness.  Abdominal: Soft. He exhibits no distension and no mass. There is no tenderness. There is no rebound and no guarding.  Musculoskeletal: Normal range of motion. He exhibits no edema and no tenderness.  Neurological: He is alert and oriented to person, place, and time.  Skin: Skin is warm and dry.  Psychiatric: He has a normal mood and affect. His behavior is normal. Judgment and thought content normal.    ED Course  Procedures (including critical care time)  DIAGNOSTIC STUDIES: Oxygen Saturation is 100% on RA, normal by my interpretation.    COORDINATION OF CARE:  3:15 PM Discussed treatment plan with  patient at bedside.  Patient acknowledges and agrees with plan.   Results for orders placed during the hospital encounter of 11/16/13  CBC WITH DIFFERENTIAL      Result Value Ref Range   WBC 6.2  4.0 - 10.5 K/uL   RBC 5.87 (*) 4.22 - 5.81 MIL/uL   Hemoglobin 18.1 (*) 13.0 - 17.0 g/dL   HCT 52.8 (*) 39.0 - 52.0 %   MCV 89.9  78.0 - 100.0 fL   MCH 30.8  26.0 - 34.0 pg   MCHC 34.3  30.0 - 36.0 g/dL   RDW  12.2  11.5 - 15.5 %   Platelets 246  150 - 400 K/uL   Neutrophils Relative % 61  43 - 77 %   Neutro Abs 3.8  1.7 - 7.7 K/uL   Lymphocytes Relative 27  12 - 46 %   Lymphs Abs 1.6  0.7 - 4.0 K/uL   Monocytes Relative 11  3 - 12 %   Monocytes Absolute 0.7  0.1 - 1.0 K/uL   Eosinophils Relative 1  0 - 5 %   Eosinophils Absolute 0.1  0.0 - 0.7 K/uL   Basophils Relative 0  0 - 1 %   Basophils Absolute 0.0  0.0 - 0.1 K/uL  BASIC METABOLIC PANEL      Result Value Ref Range   Sodium 139  137 - 147 mEq/L   Potassium 4.3  3.7 - 5.3 mEq/L   Chloride 100  96 - 112 mEq/L   CO2 24  19 - 32 mEq/L   Glucose, Bld 120 (*) 70 - 99 mg/dL   BUN 13  6 - 23 mg/dL   Creatinine, Ser 0.83  0.50 - 1.35 mg/dL   Calcium 10.5  8.4 - 10.5 mg/dL   GFR calc non Af Amer >90  >90 mL/min   GFR calc Af Amer >90  >90 mL/min   Anion gap 15  5 - 15  ACETAMINOPHEN LEVEL      Result Value Ref Range   Acetaminophen (Tylenol), Serum <15.0  10 - 30 ug/mL  SALICYLATE LEVEL      Result Value Ref Range   Salicylate Lvl <0.9 (*) 2.8 - 20.0 mg/dL  ETHANOL      Result Value Ref Range   Alcohol, Ethyl (B) <11  0 - 11 mg/dL  URINE RAPID DRUG SCREEN (HOSP PERFORMED)      Result Value Ref Range   Opiates NONE DETECTED  NONE DETECTED   Cocaine POSITIVE (*) NONE DETECTED   Benzodiazepines POSITIVE (*) NONE DETECTED   Amphetamines POSITIVE (*) NONE DETECTED   Tetrahydrocannabinol POSITIVE (*) NONE DETECTED   Barbiturates NONE DETECTED  NONE DETECTED   No results found.   Imaging Review No results found.   EKG Interpretation None      MDM   Final diagnoses:  Polysubstance dependence  Benzodiazepine dependence    Patient requesting detox form benzos and other street drugs.  Not in any apparent distress.    TTS consult pending.  Medically clear.  Dispo per TTS.  I personally performed the services described in this documentation, which was scribed in my presence. The recorded information has been reviewed and is  accurate.     Montine Circle, PA-C 11/16/13 [phone]

## 2013-11-16 NOTE — ED Notes (Signed)
PA- student at bedside. Pt cooperative

## 2013-11-16 NOTE — BH Assessment (Signed)
Tele Assessment Note   Erik [name] is an 18 y.o. male who came to [facility] requesting detox from benzodiazepines.  Pt's dad works for [employer] as a VP--head of IT.  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  He wants OP treatment at [facility].  He says he uses between 20-100 mg of xanax per week. 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  Pt says that he also uses multiple other drugs, including heroin, cocaine, Mollys, Sassafras,  Marijuana, "whatever I can get my hands on".  Pt has had treatment two times before--once in 2011 at [facility] and one in January at [facility] in 2015.   Pt admits to depression and passive SI--he says that "if no one cared, that he would disappear and blow his brains out".  He has attempted to kill himself several times by strangling, overdose and cutting, but says he has not attempted recently.  Pt says he was arrested this week at [facility] for possession of a weapon and terrorist charges, but he says the weapon was an airsoft gun that he "won playing a game with a skater down there". He denies thoughts of harming others, HI, A/V hallucinations.  Pt says he has a supportive family of both parents, sister, multiple other family members.  Dr. Parke Poisson recommends IP treatment for detox and depression.  Pt to be transferred to [facility] for medical clearance and placement.  Axis I: Mood Disorder NOS and Substance Abuse Axis II: Deferred Axis III:  Past Medical History  Diagnosis Date  . Sinusitis   . Anxiety   . Hepatitis B    Axis IV: educational problems, other psychosocial or environmental problems and problems related to legal system/crime Axis V: 31-40 impairment in reality testing  Past Medical History:  Past Medical History  Diagnosis Date  . Sinusitis   . Anxiety   . Hepatitis B     No past surgical history on file.  Family History: No family history on file.  Social History:  reports that he  has been smoking.  He has never used smokeless tobacco. He reports that he uses illicit drugs (Marijuana and Cocaine). He reports that he does not drink alcohol.  Additional Social History:  Alcohol / Drug Use Pain Medications: uses multiple substances off and on Prescriptions: uses multiple substances off and on Over the Counter: uses multiple substances off and on History of alcohol / drug use?: Yes Longest period of sobriety (when/how long): 11 1/2 months Negative Consequences of Use: Financial;Legal;Personal relationships;Work / Youth worker Withdrawal Symptoms: Tremors;Agitation Substance #1 Name of Substance 1: xanax 1 - Age of First Use: 46 1 - Amount (size/oz): between 20-100 mg/week 1 - Frequency: daily 1 - Duration: a year and a half 1 - Last Use / Amount: "either yesterday or 2 days ago"  CIWA:   COWS:    PATIENT STRENGTHS: (choose at least two) Ability for insight Average or above average intelligence Capable of independent living Occupational psychologist fund of knowledge Motivation for treatment/growth Supportive family/friends Work skills  Allergies:  Allergies  Allergen Reactions  . Penicillins   . Sulfa Antibiotics     Home Medications:  (Not in a hospital admission)  OB/GYN Status:  No LMP for male patient.  General Assessment Data Location of Assessment: [facility] ED Is this a Tele or Face-to-Face Assessment?: Face-to-Face Is this an Initial Assessment or a Re-assessment for this encounter?:  Initial Assessment Living Arrangements: Parent Can pt return to current living arrangement?: Yes Admission Status: Voluntary Is patient capable of signing voluntary admission?: Yes Transfer from: Home Referral Source: Self/Family/Friend  Medical Screening Exam ([place]) Medical Exam completed: Yes  [place] Living Arrangements: Parent Name of Psychiatrist: none Name of Therapist: pt cannot remember  Education Status Is  patient currently in school?: Yes Current Grade: college Highest grade of school patient has completed: 24 Name of school: [employer] to self with the past 6 months Suicidal Ideation: Yes-Currently Present Suicidal Intent: No Is patient at risk for suicide?: Yes Suicidal Plan?: No Access to Means: Yes Specify Access to Suicidal Means:  (over the counter meds, belts, etc) What has been your use of drugs/alcohol within the last 12 months?: see SA section Previous Attempts/Gestures: Yes How many times?:  ("multiple") Other Self Harm Risks:  (SA) Triggers for Past Attempts: Unpredictable Intentional Self Injurious Behavior: None Family Suicide History: Unknown (pt is adopted ) Recent stressful life event(s): Legal Issues (SA) Persecutory voices/beliefs?: No Depression: Yes Depression Symptoms: Despondent;Insomnia;Tearfulness;Isolating;Fatigue;Guilt;Loss of interest in usual pleasures;Feeling worthless/self pity;Feeling angry/irritable Substance abuse history and/or treatment for substance abuse?: Yes Suicide prevention information given to non-admitted patients: Not applicable  Risk to Others within the past 6 months Homicidal Ideation: No Thoughts of Harm to Others: No Current Homicidal Intent: No Current Homicidal Plan: No Access to Homicidal Means: No History of harm to others?: No Assessment of Violence: None Noted Violent Behavior Description:  (see note) Does patient have access to weapons?: No Criminal Charges Pending?: Yes Describe Pending Criminal Charges:  (see note) Does patient have a court date: Yes Court Date:  (October)  Psychosis Hallucinations: None noted Delusions: None noted  Mental Status Report Appear/Hygiene: Unremarkable Eye Contact: Good Motor Activity: Restlessness Speech: Logical/coherent;Rapid Level of Consciousness: Alert Mood: Anxious;Guilty;Depressed Affect: Anxious;Apprehensive;Depressed;Sad Anxiety Level: Panic Attacks Panic attack  frequency:  (monthly) Most recent panic attack: 2 weeks ago Thought Processes: Coherent;Relevant Judgement: Impaired Orientation: Person;Place;Time;Situation Obsessive Compulsive Thoughts/Behaviors: None  Cognitive Functioning Concentration: Decreased Memory: Recent Impaired;Remote Intact IQ: Above Average Insight: Poor Impulse Control: Poor Appetite: Poor Weight Loss:  ("a lot") Weight Gain: 0 Sleep: Decreased Total Hours of Sleep:  (2) Vegetative Symptoms: None  ADLScreening [facility] Assessment Services) Patient's cognitive ability adequate to safely complete daily activities?: Yes Patient able to express need for assistance with ADLs?: Yes Independently performs ADLs?: Yes (appropriate for developmental age)  Prior Inpatient Therapy Prior Inpatient Therapy: Yes Prior Therapy Dates:  (2011,  Jan 2015) Prior Therapy Facilty/Provider(s):  ([facility]) Reason for Treatment:  (SA)  Prior Outpatient Therapy Prior Outpatient Therapy: Yes Prior Therapy Dates:  (unknown) Prior Therapy Facilty/Provider(s): unknown Reason for Treatment: SA  ADL Screening (condition at time of admission) Patient's cognitive ability adequate to safely complete daily activities?: Yes Is the patient deaf or have difficulty hearing?: No Does the patient have difficulty seeing, even when wearing glasses/contacts?: No Does the patient have difficulty concentrating, remembering, or making decisions?: No Patient able to express need for assistance with ADLs?: Yes Does the patient have difficulty dressing or bathing?: No Independently performs ADLs?: Yes (appropriate for developmental age) Does the patient have difficulty walking or climbing stairs?: No  Home Assistive Devices/Equipment Home Assistive Devices/Equipment: None    Abuse/Neglect Assessment (Assessment to be complete while patient is alone) Physical Abuse: Denies Verbal Abuse: Denies Sexual Abuse: Denies Exploitation of  patient/patient's resources: Denies Self-Neglect: Denies  Additional Information 1:1 In Past 12 Months?: No CIRT Risk: No Elopement Risk: Yes Does patient have medical clearance?: No     Disposition:  Disposition Initial Assessment Completed for this Encounter: Yes Disposition of Patient: Inpatient treatment program  [facility] 11/16/2013 3:12 PM

## 2013-11-16 NOTE — ED Notes (Signed)
Pt has one personal belongings bag

## 2013-11-16 NOTE — ED Notes (Signed)
Pt states that he uses a lot of xanax, cocaine, heroin.  Pt states that he likes to abuse benzos bc opioids make him sick on his stomach.  Pt states that he wants help because the addiction can control and ruin your life.   Pt states that he has fantasy thoughts that he creates in his head.  Like he is more self destructive person and pollute his mind like tell people that he went back to [place] where he used to live.  Pt states then he would run away and blow his brains out.

## 2013-11-16 NOTE — BH Assessment (Signed)
Voluntary Paperwork completed, faxed to [facility].   Lear Ng, [facility] Triage Specialist 11/16/2013 7:47 PM

## 2013-11-17 ENCOUNTER — Encounter (HOSPITAL_COMMUNITY): Payer: Self-pay

## 2013-11-17 DIAGNOSIS — R45851 Suicidal ideations: Secondary | ICD-10-CM

## 2013-11-17 DIAGNOSIS — F1320 Sedative, hypnotic or anxiolytic dependence, uncomplicated: Principal | ICD-10-CM

## 2013-11-17 DIAGNOSIS — F1920 Other psychoactive substance dependence, uncomplicated: Secondary | ICD-10-CM

## 2013-11-17 NOTE — Progress Notes (Signed)
Adult Psychoeducational Group Note  Date:  11/17/2013 Time:  10:09 PM  Group Topic/Focus:  Wrap-Up Group:   The focus of this group is to help patients review their daily goal of treatment and discuss progress on daily workbooks.  Participation Level:  Active  Participation Quality:  Appropriate  Affect:  Appropriate  Cognitive:  Appropriate  Insight: Appropriate  Engagement in Group:  Engaged  Modes of Intervention:  Discussion  Additional Comments: The patient expressed that he been asleep all day.The patient also said that overall his day has been hopeful.  Nash Shearer 11/17/2013, 10:09 PM

## 2013-11-17 NOTE — BHH Suicide Risk Assessment (Signed)
Suicide Risk Assessment  Admission Assessment     Nursing information obtained from:    Demographic factors:    Current Mental Status:    Loss Factors:    Historical Factors:    Risk Reduction Factors:    Total Time spent with patient: 1 hour  CLINICAL FACTORS:   Depression:   Anhedonia Hopelessness Impulsivity Dysthymia Alcohol/Substance Abuse/Dependencies More than one psychiatric diagnosis Previous Psychiatric Diagnoses and Treatments  Psychiatric Specialty Exam:     Blood pressure 97/45, pulse 46, temperature 97 F (36.1 C), temperature source Oral, resp. rate 20, height 5\' 4"  (1.626 m), weight 120 lb (54.432 kg).Body mass index is 20.59 kg/(m^2).  General Appearance: Casual  Eye Contact::  Fair  Speech:  Slow  Volume:  Decreased  Mood:  Anxious and Dysphoric  Affect:  Congruent  Thought Process:  Coherent  Orientation:  Full (Time, Place, and Person)  Thought Content:  Rumination  Suicidal Thoughts:  Yes.  without intent/plan when he was under influence of substances. Not having those toughts now.   Homicidal Thoughts:  No  Memory:  Immediate;   Fair Recent;   Fair  Judgement:  Poor  Insight:  Shallow  Psychomotor Activity:  Decreased  Concentration:  Fair  Recall:  [place]: Fair  Akathisia:  Negative  Handed:  Right  AIMS (if indicated):     Assets:  Desire for Improvement Physical Health Social Support  Sleep:  Number of Hours: 5   Musculoskeletal: Strength & Muscle Tone: within normal limits Gait & Station: normal Patient leans: N/A  COGNITIVE FEATURES THAT CONTRIBUTE TO RISK:  Closed-mindedness Polarized thinking    SUICIDE RISK:   Moderate:  Frequent suicidal ideation with limited intensity, and duration, some specificity in terms of plans, no associated intent, good self-control, limited dysphoria/symptomatology, some risk factors present, and identifiable protective factors, including available and accessible  social support.  PLAN OF CARE:  I certify that inpatient services furnished can reasonably be expected to improve the patient's condition.  Tammatha Cobb 11/17/2013, 9:52 AM

## 2013-11-17 NOTE — Progress Notes (Signed)
D) Pt has been out in the milieu socializing, but not attending the groups. Sleeping through them. States that he is feeling tired and really doesn't want to join in the groups today. Rates his depression at a 3 his hopelessness at a 3 and his anxiety at a 5. Denies SI and HI. A) Encouraged to go the groups and to spend time out in the milieu. Provided with a brief 1:1 R) Pt denies SI

## 2013-11-17 NOTE — H&P (Signed)
Psychiatric Admission Assessment Adult  Patient Identification:  Erik [name] Date of Evaluation:  11/17/2013 Chief Complaint:  "My life spiraled out of control because of my drug use."  History of Present Illness::  Erik [name] is an 18 y.o. male who came to [facility] requesting detox from benzodiazepines. He reported using multiple drugs including heroin, cocaine, molllys, benzos, and marijuana. Patient admits to depression and suicidal thoughts. The patient states "I started using needles. Then my life started spiraling. That made me depressed. Then when I was high I was caught with an air-soft gun on campus. It was not even mine but I was holding it. They labeled me as a terrorist. My drug use is starting to have bad consequences. I have not been thinking clearly." The patient's UDS is noted to be positive for amphetamines, benzodiazepines, cocaine, and marijuana. [name] is minimally cooperative with his assessment today stating "I have not slept in three days. I am very tired." The patient was prompted to open his eyes and interact with Provider. He reports withdrawal symptoms of fatigue, body aches, tremors, and chills. Prior to admission the patient was experiencing passive SI with thoughts to shoot himself.   Elements:  Location:  Adult unit for polysubstance abuse. Quality:  Decreased ability to function, erratic behaviors . Severity:  Severe . Timing:  Over the last week getting worse . Duration:  Acute . Context:  Drug abuse, school stress, functional impairment . Associated Signs/Synptoms: Depression Symptoms:  depressed mood, anhedonia, feelings of worthlessness/guilt, difficulty concentrating, hopelessness, recurrent thoughts of death, suicidal thoughts with specific plan, anxiety, loss of energy/fatigue, disturbed sleep, (Hypo) Manic Symptoms:  Impulsivity, Anxiety Symptoms:  Denies Psychotic Symptoms:  Denies PTSD Symptoms: NA Total Time spent with patient: 1  hour  Psychiatric Specialty Exam: Physical Exam  Review of Systems  Constitutional: Positive for chills, malaise/fatigue and diaphoresis.  HENT: Negative.   Eyes: Negative.   Respiratory: Negative.   Cardiovascular: Negative.   Gastrointestinal: Negative.   Genitourinary: Negative.   Musculoskeletal: Positive for myalgias.  Skin: Negative.   Neurological: Positive for tremors.  Endo/Heme/Allergies: Negative.   Psychiatric/Behavioral: Positive for depression, suicidal ideas and substance abuse. The patient has insomnia.     Blood pressure 97/45, pulse 46, temperature 97 F (36.1 C), temperature source Oral, resp. rate 20, height 5' 4"  (1.626 m), weight 54.432 kg (120 lb).Body mass index is 20.59 kg/(m^2).  General Appearance: Casual  Eye Contact::  Fair  Speech:  Slow  Volume:  Decreased  Mood:  Anxious and Dysphoric  Affect:  Congruent  Thought Process:  Coherent  Orientation:  Full (Time, Place, and Person)  Thought Content:  Rumination  Suicidal Thoughts:  Yes.  without intent/plan, when under the influence of substances.   Homicidal Thoughts:  No  Memory:  Immediate;   Fair Recent;   Fair Remote;   Fair  Judgement:  Poor  Insight:  Shallow  Psychomotor Activity:  Decreased  Concentration:  Fair  Recall:  Tyler: Fair  Akathisia:  Negative  Handed:  Right  AIMS (if indicated):     Assets:  Desire for Improvement Physical Health Social Support  Sleep:  Number of Hours: 5   Musculoskeletal: Strength & Muscle Tone: within normal limits Gait & Station: normal Patient leans: N/A  Past Psychiatric History: Diagnosis:ADHD as a child   Hospitalizations: [facility]  Substance Abuse Care:  Self-Mutilation:Denies  Suicidal Attempts:Attempts to hang self in the past  Violent  Behaviors:Denies   Past Medical History:   Past Medical History  Diagnosis Date  . Sinusitis   . Anxiety   . Hepatitis B     None. Allergies:   Allergies  Allergen Reactions  . Penicillins Anaphylaxis    All "-cillins."   . Sulfa Antibiotics     Unknown.    PTA Medications: No prescriptions prior to admission    Previous Psychotropic Medications:  Medication/Dose  Adderall from ages [age].                Substance Abuse History in the last 12 months:  Yes.   Patient's UDS is positive for amphetamines, benzodiazepines, cocaine, and marijuana.   Consequences of Substance Abuse: Withdrawal Symptoms:   Diaphoresis Tremors chills, body aches   Social History:  reports that he has been smoking Cigarettes.  He has been smoking about 0.50 packs per day. He has never used smokeless tobacco. He reports that he drinks alcohol. He reports that he uses illicit drugs (Marijuana and Cocaine). 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 Legal History: Hobbies/Interests:  Family History:  History reviewed. No pertinent family history.  Results for orders placed during the hospital encounter of 11/16/13 (from the past 72 hour(s))  URINE RAPID DRUG SCREEN (HOSP PERFORMED)     Status: Abnormal   Collection Time    11/16/13  3:26 PM      Result Value Ref Range   Opiates NONE DETECTED  NONE DETECTED   Cocaine POSITIVE (*) NONE DETECTED   Benzodiazepines POSITIVE (*) NONE DETECTED   Amphetamines POSITIVE (*) NONE DETECTED   Tetrahydrocannabinol POSITIVE (*) NONE DETECTED   Barbiturates NONE DETECTED  NONE DETECTED   Comment:            DRUG SCREEN FOR MEDICAL PURPOSES     ONLY.  IF CONFIRMATION IS NEEDED     FOR ANY PURPOSE, NOTIFY LAB     WITHIN  5 DAYS.                LOWEST DETECTABLE LIMITS     FOR URINE DRUG SCREEN     Drug Class       Cutoff (ng/mL)     Amphetamine      1000     Barbiturate      200     Benzodiazepine   354     Tricyclics       656     Opiates          300     Cocaine          300     THC              50  CBC WITH DIFFERENTIAL     Status: Abnormal   Collection Time    11/16/13  3:43 PM      Result Value Ref Range   WBC 6.2  4.0 - 10.5 K/uL   RBC 5.87 (*) 4.22 - 5.81 MIL/uL   Hemoglobin 18.1 (*) 13.0 - 17.0 g/dL   HCT 52.8 (*) 39.0 - 52.0 %   MCV 89.9  78.0 - 100.0 fL   MCH 30.8  26.0 - 34.0 pg   MCHC 34.3  30.0 - 36.0 g/dL   RDW 12.2  11.5 - 15.5 %   Platelets 246  150 - 400 K/uL   Neutrophils Relative % 61  43 - 77 %   Neutro Abs 3.8  1.7 - 7.7 K/uL   Lymphocytes Relative 27  12 - 46 %   Lymphs Abs 1.6  0.7 - 4.0 K/uL   Monocytes Relative 11  3 - 12 %   Monocytes Absolute 0.7  0.1 - 1.0 K/uL   Eosinophils Relative 1  0 - 5 %   Eosinophils Absolute 0.1  0.0 - 0.7 K/uL   Basophils Relative 0  0 - 1 %   Basophils Absolute 0.0  0.0 - 0.1 K/uL  BASIC METABOLIC PANEL     Status: Abnormal   Collection Time    11/16/13  3:43 PM      Result Value Ref Range   Sodium 139  137 - 147 mEq/L   Potassium 4.3  3.7 - 5.3 mEq/L   Chloride 100  96 - 112 mEq/L   CO2 24  19 - 32 mEq/L   Glucose, Bld 120 (*) 70 - 99 mg/dL   BUN 13  6 - 23 mg/dL   Creatinine, Ser 0.83  0.50 - 1.35 mg/dL   Calcium 10.5  8.4 - 10.5 mg/dL   GFR calc non Af Amer >90  >90 mL/min   GFR calc Af Amer >90  >90 mL/min   Comment: (NOTE)     The eGFR has been calculated using the CKD EPI equation.     This calculation has not been validated in all clinical situations.     eGFR's persistently <90 mL/min signify possible Chronic Kidney     Disease.   Anion gap 15  5 - 15  ACETAMINOPHEN LEVEL     Status: None   Collection Time    11/16/13  3:43 PM      Result Value Ref Range   Acetaminophen (Tylenol), Serum <15.0  10 - 30 ug/mL    Comment:            THERAPEUTIC CONCENTRATIONS VARY     SIGNIFICANTLY. A RANGE OF 10-30     ug/mL MAY BE AN EFFECTIVE     CONCENTRATION FOR MANY PATIENTS.     HOWEVER, SOME ARE BEST TREATED     AT CONCENTRATIONS OUTSIDE THIS     RANGE.     ACETAMINOPHEN CONCENTRATIONS     >150 ug/mL AT 4 HOURS AFTER     INGESTION AND >50 ug/mL AT 12     HOURS AFTER INGESTION ARE     OFTEN ASSOCIATED WITH TOXIC     REACTIONS.  SALICYLATE LEVEL     Status: Abnormal   Collection Time    11/16/13  3:43 PM      Result Value Ref Range   Salicylate Lvl <2.9 (*) 2.8 - 20.0 mg/dL  ETHANOL     Status: None   Collection Time    11/16/13  3:43 PM      Result Value Ref Range   Alcohol, Ethyl (B) <11  0 - 11 mg/dL   Comment:            LOWEST DETECTABLE LIMIT FOR     SERUM ALCOHOL IS 11 mg/dL     FOR MEDICAL PURPOSES ONLY   Psychological Evaluations:  Assessment:   DSM5:  AXIS I:  Polysubstance dependence              Benzodiazepine dependence  AXIS II:  Deferred AXIS III:   Past Medical History  Diagnosis Date  . Sinusitis   . Anxiety   . Hepatitis B    AXIS IV:  economic problems, educational problems, other psychosocial or environmental problems and problems related to social environment AXIS V:  41-50 serious symptoms  Treatment Plan/Recommendations:   1. Admit for crisis management and stabilization. Estimated length of stay 5-7 days. 2. Medication management to reduce current symptoms to base line and improve the patient's level of functioning.  3. Develop treatment plan to decrease risk of relapse upon discharge of depressive symptoms and the need for readmission. 5. Group therapy to facilitate development of healthy coping skills to use for depression and anxiety. 6. Health care follow up as needed for medical problems.  7. Discharge plan to include therapy to help patient cope with stressors.  8. Call for Consult with Hospitalist for additional specialty patient services as needed.    Treatment Plan Summary: Daily contact with patient to assess and evaluate symptoms and progress in treatment Medication management Current Medications:  Current Facility-Administered Medications  Medication Dose Route Frequency Provider Last Rate Last Dose  . acetaminophen (TYLENOL) tablet 650 mg  650 mg Oral Q6H PRN Waylan Boga, NP      . alum & mag hydroxide-simeth (MAALOX/MYLANTA) 200-200-20 MG/5ML suspension 30 mL  30 mL Oral Q4H PRN Waylan Boga, NP      . chlordiazePOXIDE (LIBRIUM) capsule 25 mg  25 mg Oral Q6H PRN Waylan Boga, NP      . chlordiazePOXIDE (LIBRIUM) capsule 25 mg  25 mg Oral QID Waylan Boga, NP       Followed by  . [START ON 11/18/2013] chlordiazePOXIDE (LIBRIUM) capsule 25 mg  25 mg Oral TID Waylan Boga, NP       Followed by  . [START ON 11/19/2013] chlordiazePOXIDE (LIBRIUM) capsule 25 mg  25 mg Oral BH-qamhs Waylan Boga, NP       Followed by  . [START ON 11/21/2013] chlordiazePOXIDE (LIBRIUM) capsule 25 mg  25 mg Oral Daily Waylan Boga, NP      . cloNIDine (CATAPRES) tablet 0.1 mg  0.1 mg Oral QID Waylan Boga, NP       Followed by  . [START ON 11/19/2013] cloNIDine (CATAPRES) tablet 0.1 mg  0.1 mg Oral BH-qamhs Waylan Boga, NP       Followed by  . [START ON 11/22/2013] cloNIDine (CATAPRES) tablet 0.1 mg  0.1 mg Oral QAC breakfast Waylan Boga, NP      . dicyclomine (BENTYL) tablet 20 mg  20 mg Oral Q6H PRN Waylan Boga, NP      . hydrOXYzine (ATARAX/VISTARIL) tablet 25 mg  25 mg Oral Q6H PRN Waylan Boga, NP      . loperamide (IMODIUM) capsule 2-4 mg  2-4 mg Oral PRN Waylan Boga, NP      . LORazepam (ATIVAN) tablet 1 mg  1 mg Oral Q8H PRN Waylan Boga, NP   1 mg at 11/17/13 0024  . magnesium hydroxide (MILK OF MAGNESIA) suspension 30 mL  30 mL Oral Daily PRN Waylan Boga, NP      . methocarbamol (ROBAXIN) tablet 500 mg  500 mg Oral Q8H PRN Waylan Boga, NP   500 mg at 11/17/13 0024  . multivitamin with minerals tablet 1 tablet  1 tablet Oral Daily Waylan Boga, NP      .  naproxen (NAPROSYN) tablet 500 mg  500 mg Oral BID PRN Waylan Boga, NP      . nicotine (NICODERM CQ - dosed in mg/24 hours) patch 21 mg  21 mg Transdermal Daily Waylan Boga, NP      . ondansetron (ZOFRAN-ODT) disintegrating tablet 4 mg  4 mg Oral Q6H PRN Waylan Boga, NP      . thiamine (B-1) injection 100 mg  100 mg Intramuscular Once Waylan Boga, NP        Observation Level/Precautions:  15 minute checks  Laboratory:  CBC Chemistry Profile UDS  Psychotherapy:  Individual and Group Therapy  Medications:  Clonidine and Librium detox protocols  Consultations:  As needed  Discharge Concerns: Continued drug use  Estimated LOS: 3-5 days  Other:  Increase collateral information    I certify that inpatient services furnished can reasonably be expected to improve the patient's condition.   Elmarie Shiley NP-C 9/12/201511:01 AM  I have examined the patient and agreed with the findings of H&P and treatment plan. I have also done suicide assessment on this patient.

## 2013-11-17 NOTE — Progress Notes (Signed)
.  Psychoeducational Group Note    Date: 11/17/2013 Time:  0930   Goal Setting Purpose of Group: To be able to set a goal that is measurable and that can be accomplished in one day Participation Level:  Did not attend  Paulino Rily

## 2013-11-17 NOTE — Progress Notes (Signed)
Patient ID: Erik [name], male   DOB: 04-26-1995, 18 y.o.   MRN: [id number] Admission note: D:Patient is a voluntary admission requesting detox from many years of substance abuse (benzo, amphetamines, cocaine, THC), depression and anxiety. Pt report he tired to detox on his own but friends advised him to seek medical help. Pt reports he used everything he can get his hand on. Pt has track marks all over his arms and legs. Pt was arrested on 11/16/2013 for possession of a weapon and terrorist charges. Pt stated the weapon was an airsoft gun he won from a Pension scheme manager. Pt is a sophomore at [employer]. Pt stated his family is very supportive. Pt endorses passive suicidal ideation without a plan. Pt denies HI/AVH.  A: Pt admitted to unit per protocol, skin assessment and belonging search done. No skin issues noted. Consent signed by pt. Pt educated on therapeutic milieu rules. Pt was introduced to milieu by nursing staff. Fall risk safety plan explained to the patient. 15 minutes checks started for safety.  R: Pt was receptive to education. Writer offered support.

## 2013-11-17 NOTE — Progress Notes (Signed)
D.  Pt pleasant on approach, denies complaints at this time.  Reports some minor s/s of withdrawal, no acute distress.  Positive for evening wrap up group, interacting appropriately with peers on the unit.  A.  Support and encouragement offered, medication given as ordered for withdrawal.  R.  Pt remains safe on unit, will continue to monitor.

## 2013-11-17 NOTE — ED Provider Notes (Signed)
Medical screening examination/treatment/procedure(s) were performed by non-physician practitioner and as supervising physician I was immediately available for consultation/collaboration.   EKG Interpretation None        Pamella Pert, MD 11/17/13 [phone]

## 2013-11-17 NOTE — Tx Team (Signed)
Initial Interdisciplinary Treatment Plan   PATIENT STRESSORS: Legal issue Substance abuse   PROBLEM LIST: Problem List/Patient Goals Date to be addressed Date deferred Reason deferred Estimated date of resolution  Substance dependence 11/16/2013     anxiety 11/16/2013     depression 11/16/2013                                          DISCHARGE CRITERIA:  Improved stabilization in mood, thinking, and/or behavior Motivation to continue treatment in a less acute level of care Reduction of life-threatening or endangering symptoms to within safe limits Withdrawal symptoms are absent or subacute and managed without 24-hour nursing intervention  PRELIMINARY DISCHARGE PLAN:   PATIENT/FAMIILY INVOLVEMENT: This treatment plan has been presented to and reviewed with the patient, Cleotis Sparr, and/or family member,.  The patient and family have been given the opportunity to ask questions and make suggestions.  JEHU-APPIAH, Jasmond River K 11/17/2013, 2:33 AM

## 2013-11-17 NOTE — BHH Group Notes (Signed)
[facility] Group Notes: (Clinical Social Work)   11/17/2013      Type of Therapy:  Group Therapy   Participation Level:  Did Not Attend    Selmer Dominion, LCSW 11/17/2013, 12:38 PM

## 2013-11-18 DIAGNOSIS — F1910 Other psychoactive substance abuse, uncomplicated: Secondary | ICD-10-CM

## 2013-11-18 DIAGNOSIS — F332 Major depressive disorder, recurrent severe without psychotic features: Secondary | ICD-10-CM

## 2013-11-18 DIAGNOSIS — F1994 Other psychoactive substance use, unspecified with psychoactive substance-induced mood disorder: Secondary | ICD-10-CM

## 2013-11-18 NOTE — Progress Notes (Signed)
D) Pt has been up and in the milieu in the afternoon. Affect and mood are appropriate. Pt denies SI and HI. Rates his depression and hopelessness both as a 1 and his anxiety as a 1. Has been interactive with his peers. Affect is bright. States that he is feeling a bit sluggish especially in the mornings. A) Given support, reassurance and praise. Encouragement given R) Denies SI and HI

## 2013-11-18 NOTE — BHH Group Notes (Signed)
[facility] Group Notes: (Clinical Social Work)   11/18/2013      Type of Therapy:  Group Therapy   Participation Level:  Did Not Attend    Selmer Dominion, LCSW 11/18/2013, 12:42 PM

## 2013-11-18 NOTE — Progress Notes (Signed)
Patient ID: Erik [name], male   DOB: Apr 10, 1995, 18 y.o.   MRN: [id number]   Type of Therapy:  Psychoeducational Skills- Self Inventory Group  Participation Level:  Did Not Attend

## 2013-11-18 NOTE — BHH Counselor (Signed)
Over yesterday and today have attempted multiple times daily to meet with patient to perform PSA unsuccessfully.  Selmer Dominion, LCSW 11/18/2013, 4:52 PM

## 2013-11-18 NOTE — Plan of Care (Signed)
Problem: Alteration in mood Goal: LTG-Patient reports reduction in suicidal thoughts (Patient reports reduction in suicidal thoughts and is able to verbalize a safety plan for whenever patient is feeling suicidal)  Outcome: Progressing Rating depression, hopelessness, and anxiety at a 1.  Denies suicidal ideation.

## 2013-11-18 NOTE — Progress Notes (Signed)
Patient did attend the evening speaker AA meeting.  

## 2013-11-18 NOTE — Progress Notes (Signed)
[facility] MD Progress Note  11/18/2013 9:41 AM Erik [name]  MRN:  [id number] Subjective:  Erik [name] is minimally cooperative with his assessment today stating he does not wish to be bothered at this time and yelled at the [facility]. The patient did not open his eyes and interact with Provider. Attempted to speak with patient this afternoon went well. Pt was more attentive and states he is feeling better.    He has been compliant with his medication regimen and actively participating on the unit activities and states that he is learning coping skills. She has no reported adverse effects of medications. She denies craving for drugs and continue to have symptoms of depression.Patient does not have any symptoms of mania or hallucinations, delusions or paranoia. Patient agreed with the current medication management and tolerating well. Denies SI/HI, AVH at this time. Currently rates his depression at 1/10, and anxiety 2-3/10.   Diagnosis:   DSM5: Schizophrenia Disorders:   Obsessive-Compulsive Disorders:   Trauma-Stressor Disorders:   Substance/Addictive Disorders:  Cannabis Use Disorder - Moderate 9304.30), Inhalant Use Disorder - Moderate (304.60) and Opioid Disorder - Severe (304.00) Depressive Disorders:  Major Depressive Disorder - Severe (296.23) Total Time spent with patient: 15 minutes  Axis I: Major Depression, Recurrent severe, Substance Abuse and Substance Induced Mood Disorder Axis II: Deferred Axis IV: economic problems, educational problems, housing problems, other psychosocial or environmental problems, problems related to social environment, problems with access to health care services and problems with primary support group Axis V: 31-40 impairment in reality testing  ADL's:  Intact  Sleep: Good  Appetite:  Fair  Suicidal Ideation:  Plan:  Denies Intent:  Denies Means:  Denies Homicidal Ideation:  Plan:  Denies Intent:  Denies Means:  Denies AEB (as evidenced by):  Psychiatric  Specialty Exam: Physical Exam  ROS  Blood pressure 101/54, pulse 51, temperature 97.8 F (36.6 C), temperature source Oral, resp. rate 18, height 5' 4"  (1.626 m), weight 54.432 kg (120 lb).Body mass index is 20.59 kg/(m^2).  General Appearance: Fairly Groomed  Engineer, water::  Good  Speech:  Clear and Coherent and Normal Rate  Volume:  Normal  Mood:  Depressed  Affect:  Depressed and Flat  Thought Process:  Goal Directed and Intact  Orientation:  Full (Time, Place, and Person)  Thought Content:  WDL  Suicidal Thoughts:  No  Homicidal Thoughts:  No  Memory:  Immediate;   Fair Recent;   Fair Remote;   Fair  Judgement:  Fair  Insight:  Fair  Psychomotor Activity:  Normal  Concentration:  Good  Recall:  Good  Fund of Knowledge:Fair  Language: Good  Akathisia:  No  Handed:  Right  AIMS (if indicated):     Assets:  Communication Skills Desire for Improvement Financial Resources/Insurance Leisure Time Resilience Vocational/Educational  Sleep:  Number of Hours: 5.75   Musculoskeletal: Strength & Muscle Tone: within normal limits Gait & Station: normal Patient leans: N/A  Current Medications: Current Facility-Administered Medications  Medication Dose Route Frequency Provider Last Rate Last Dose  . acetaminophen (TYLENOL) tablet 650 mg  650 mg Oral Q6H PRN Waylan Boga, NP   650 mg at 11/17/13 1722  . alum & mag hydroxide-simeth (MAALOX/MYLANTA) 200-200-20 MG/5ML suspension 30 mL  30 mL Oral Q4H PRN Waylan Boga, NP      . chlordiazePOXIDE (LIBRIUM) capsule 25 mg  25 mg Oral Q6H PRN Waylan Boga, NP      . chlordiazePOXIDE (LIBRIUM) capsule 25 mg  25 mg Oral QID  Waylan Boga, NP   25 mg at 11/17/13 2225   Followed by  . chlordiazePOXIDE (LIBRIUM) capsule 25 mg  25 mg Oral TID Waylan Boga, NP       Followed by  . [START ON 11/19/2013] chlordiazePOXIDE (LIBRIUM) capsule 25 mg  25 mg Oral BH-qamhs Waylan Boga, NP       Followed by  . [START ON 11/21/2013] chlordiazePOXIDE  (LIBRIUM) capsule 25 mg  25 mg Oral Daily Waylan Boga, NP      . cloNIDine (CATAPRES) tablet 0.1 mg  0.1 mg Oral QID Waylan Boga, NP   0.1 mg at 11/17/13 2225   Followed by  . [START ON 11/19/2013] cloNIDine (CATAPRES) tablet 0.1 mg  0.1 mg Oral BH-qamhs Waylan Boga, NP       Followed by  . [START ON 11/22/2013] cloNIDine (CATAPRES) tablet 0.1 mg  0.1 mg Oral QAC breakfast Waylan Boga, NP      . dicyclomine (BENTYL) tablet 20 mg  20 mg Oral Q6H PRN Waylan Boga, NP   20 mg at 11/17/13 2225  . hydrOXYzine (ATARAX/VISTARIL) tablet 25 mg  25 mg Oral Q6H PRN Waylan Boga, NP   25 mg at 11/17/13 2254  . loperamide (IMODIUM) capsule 2-4 mg  2-4 mg Oral PRN Waylan Boga, NP      . LORazepam (ATIVAN) tablet 1 mg  1 mg Oral Q8H PRN Waylan Boga, NP   1 mg at 11/17/13 2225  . magnesium hydroxide (MILK OF MAGNESIA) suspension 30 mL  30 mL Oral Daily PRN Waylan Boga, NP      . methocarbamol (ROBAXIN) tablet 500 mg  500 mg Oral Q8H PRN Waylan Boga, NP   500 mg at 11/17/13 2225  . multivitamin with minerals tablet 1 tablet  1 tablet Oral Daily Waylan Boga, NP   1 tablet at 11/17/13 1136  . naproxen (NAPROSYN) tablet 500 mg  500 mg Oral BID PRN Waylan Boga, NP      . nicotine (NICODERM CQ - dosed in mg/24 hours) patch 21 mg  21 mg Transdermal Daily Waylan Boga, NP   21 mg at 11/17/13 1139  . ondansetron (ZOFRAN-ODT) disintegrating tablet 4 mg  4 mg Oral Q6H PRN Waylan Boga, NP      . thiamine (B-1) injection 100 mg  100 mg Intramuscular Once Waylan Boga, NP        Lab Results:  Results for orders placed during the hospital encounter of 11/16/13 (from the past 48 hour(s))  URINE RAPID DRUG SCREEN (HOSP PERFORMED)     Status: Abnormal   Collection Time    11/16/13  3:26 PM      Result Value Ref Range   Opiates NONE DETECTED  NONE DETECTED   Cocaine POSITIVE (*) NONE DETECTED   Benzodiazepines POSITIVE (*) NONE DETECTED   Amphetamines POSITIVE (*) NONE DETECTED   Tetrahydrocannabinol POSITIVE (*)  NONE DETECTED   Barbiturates NONE DETECTED  NONE DETECTED   Comment:            DRUG SCREEN FOR MEDICAL PURPOSES     ONLY.  IF CONFIRMATION IS NEEDED     FOR ANY PURPOSE, NOTIFY LAB     WITHIN 5 DAYS.                LOWEST DETECTABLE LIMITS     FOR URINE DRUG SCREEN     Drug Class       Cutoff (ng/mL)     Amphetamine  1000     Barbiturate      200     Benzodiazepine   403     Tricyclics       474     Opiates          300     Cocaine          300     THC              50  CBC WITH DIFFERENTIAL     Status: Abnormal   Collection Time    11/16/13  3:43 PM      Result Value Ref Range   WBC 6.2  4.0 - 10.5 K/uL   RBC 5.87 (*) 4.22 - 5.81 MIL/uL   Hemoglobin 18.1 (*) 13.0 - 17.0 g/dL   HCT 52.8 (*) 39.0 - 52.0 %   MCV 89.9  78.0 - 100.0 fL   MCH 30.8  26.0 - 34.0 pg   MCHC 34.3  30.0 - 36.0 g/dL   RDW 12.2  11.5 - 15.5 %   Platelets 246  150 - 400 K/uL   Neutrophils Relative % 61  43 - 77 %   Neutro Abs 3.8  1.7 - 7.7 K/uL   Lymphocytes Relative 27  12 - 46 %   Lymphs Abs 1.6  0.7 - 4.0 K/uL   Monocytes Relative 11  3 - 12 %   Monocytes Absolute 0.7  0.1 - 1.0 K/uL   Eosinophils Relative 1  0 - 5 %   Eosinophils Absolute 0.1  0.0 - 0.7 K/uL   Basophils Relative 0  0 - 1 %   Basophils Absolute 0.0  0.0 - 0.1 K/uL  BASIC METABOLIC PANEL     Status: Abnormal   Collection Time    11/16/13  3:43 PM      Result Value Ref Range   Sodium 139  137 - 147 mEq/L   Potassium 4.3  3.7 - 5.3 mEq/L   Chloride 100  96 - 112 mEq/L   CO2 24  19 - 32 mEq/L   Glucose, Bld 120 (*) 70 - 99 mg/dL   BUN 13  6 - 23 mg/dL   Creatinine, Ser 0.83  0.50 - 1.35 mg/dL   Calcium 10.5  8.4 - 10.5 mg/dL   GFR calc non Af Amer >90  >90 mL/min   GFR calc Af Amer >90  >90 mL/min   Comment: (NOTE)     The eGFR has been calculated using the CKD EPI equation.     This calculation has not been validated in all clinical situations.     eGFR's persistently <90 mL/min signify possible Chronic Kidney      Disease.   Anion gap 15  5 - 15  ACETAMINOPHEN LEVEL     Status: None   Collection Time    11/16/13  3:43 PM      Result Value Ref Range   Acetaminophen (Tylenol), Serum <15.0  10 - 30 ug/mL   Comment:            THERAPEUTIC CONCENTRATIONS VARY     SIGNIFICANTLY. A RANGE OF 10-30     ug/mL MAY BE AN EFFECTIVE     CONCENTRATION FOR MANY PATIENTS.     HOWEVER, SOME ARE BEST TREATED     AT CONCENTRATIONS OUTSIDE THIS     RANGE.     ACETAMINOPHEN CONCENTRATIONS     >150 ug/mL AT 4 HOURS AFTER  INGESTION AND >50 ug/mL AT 12     HOURS AFTER INGESTION ARE     OFTEN ASSOCIATED WITH TOXIC     REACTIONS.  SALICYLATE LEVEL     Status: Abnormal   Collection Time    11/16/13  3:43 PM      Result Value Ref Range   Salicylate Lvl <0.9 (*) 2.8 - 20.0 mg/dL  ETHANOL     Status: None   Collection Time    11/16/13  3:43 PM      Result Value Ref Range   Alcohol, Ethyl (B) <11  0 - 11 mg/dL   Comment:            LOWEST DETECTABLE LIMIT FOR     SERUM ALCOHOL IS 11 mg/dL     FOR MEDICAL PURPOSES ONLY    Physical Findings: AIMS: Facial and Oral Movements Muscles of Facial Expression: None, normal Lips and Perioral Area: None, normal Jaw: None, normal Tongue: None, normal,Extremity Movements Upper (arms, wrists, hands, fingers): None, normal Lower (legs, knees, ankles, toes): None, normal, Trunk Movements Neck, shoulders, hips: None, normal, Overall Severity Severity of abnormal movements (highest score from questions above): None, normal Incapacitation due to abnormal movements: None, normal Patient's awareness of abnormal movements (rate only patient's report): No Awareness, Dental Status Current problems with teeth and/or dentures?: No Does patient usually wear dentures?: No  CIWA:  CIWA-Ar Total: 3 COWS:  COWS Total Score: 7  Treatment Plan Summary: Daily contact with patient to assess and evaluate symptoms and progress in treatment Medication management  Plan: 1. Admit for  crisis management and stabilization. Estimated length of stay 5-7 days.  2. Medication management to reduce current symptoms to base line and improve the patient's level of functioning.  3. Develop treatment plan to decrease risk of relapse upon discharge of depressive symptoms and the need for readmission.  5. Group therapy to facilitate development of healthy coping skills to use for depression and anxiety.  6. Health care follow up as needed for medical problems.  7. Discharge plan to include therapy to help patient cope with stressors.  8. Call for Consult with Hospitalist for additional specialty patient services as needed.   Medical Decision Making Problem Points:  Established problem, worsening (2), Review of last therapy session (1) and Review of psycho-social stressors (1) Data Points:  Review or order clinical lab tests (1) Review or order medicine tests (1) Review and summation of old records (2) Review of medication regiment & side effects (2)  I certify that inpatient services furnished can reasonably be expected to improve the patient's condition.   Priscille Loveless S FNP-BC 11/18/2013, 9:41 AM I agreed with findings and treatment plan of this patient

## 2013-11-18 NOTE — Progress Notes (Signed)
D.  Pt pleasant on approach, denies complaints at this time.  Denies SI/HI/hallucinations at this time.  Positive for evening AA group, interacting appropriately with peers on the unit.  No acute withdrawal at this time.  Pt states that he takes Ativan only at HS because he is afraid of possible benzo withdrawal seizures.  A.  Support and encouragement offered, medication given as ordered   R.  Pt remains safe on unit, will continue to monitor.

## 2013-11-19 NOTE — Clinical Social Work Note (Signed)
CSW faxed referral to [facility] per patient request. Awaiting call back for follow up appointment.  Tilden Fossa, MSW, [place] [facility]ract Worker [facility] [phone]

## 2013-11-19 NOTE — Progress Notes (Signed)
[facility] MD Progress Note  11/19/2013 4:15 PM Erik [name]  MRN:  [id number] Subjective:  Erik [name] is able to look back at his journey being asian adopted by a caucasian family. He is insightful as the possible family history that could have led to being adopted (mental illness? Cultural issues?  Addictions?). He admits he has dealt with self esteem issues and  Identity issues, and how all these factors might have placed a role in his addiction. He is wanting to get his life back together. He had been in [facility] for the 30 days program but when he got out he did not pursue follow up. He made maybe one meeting. He now knows that he needed a Recovery structure as well as individual therapy  to help with his struggles.  Diagnosis:   DSM5: Substance/Addictive Disorders:  Alcohol Related Disorder - Severe (303.90), Cannabis Use Disorder - Moderate 9304.30) and Opioid Disorder - Moderate (304.00) Cocaine Use Disorder, Benzodiazepine Use Disorder Depressive Disorders:  Major Depressive Disorder - Mild (296.21) Total Time spent with patient: 30 minutes  Axis I: Substance Induced Mood Disorder  ADL's:  Intact  Sleep: Fair  Appetite:  Fair  Psychiatric Specialty Exam: Physical Exam  Review of Systems  Constitutional: Negative.   HENT: Negative.   Eyes: Negative.   Respiratory: Negative.   Cardiovascular: Negative.   Gastrointestinal: Negative.   Genitourinary: Negative.   Musculoskeletal: Negative.   Skin: Negative.   Neurological: Negative.   Endo/Heme/Allergies: Negative.   Psychiatric/Behavioral: Positive for substance abuse. The patient is nervous/anxious.     Blood pressure 124/63, pulse 65, temperature 99.5 F (37.5 C), temperature source Oral, resp. rate 16, height 5\' 4"  (1.626 m), weight 54.432 kg (120 lb).Body mass index is 20.59 kg/(m^2).  General Appearance: Fairly Groomed  Engineer, water::  Fair  Speech:  Clear and Coherent  Volume:  Normal  Mood:  Anxious and worried  Affect:   anxious  Thought Process:  Coherent and Goal Directed  Orientation:  Full (Time, Place, and Person)  Thought Content:  symptoms worries concerns  Suicidal Thoughts:  No  Homicidal Thoughts:  No  Memory:  Immediate;   Fair Recent;   Fair Remote;   Fair  Judgement:  Fair  Insight:  Present  Psychomotor Activity:  Restlessness  Concentration:  Fair  Recall:  [employer] of Knowledge:NA  Language: Fair  Akathisia:  No  Handed:    AIMS (if indicated):     Assets:  Desire for Improvement Housing Social Support Vocational/Educational  Sleep:  Number of Hours: 5.25   Musculoskeletal: Strength & Muscle Tone: within normal limits Gait & Station: normal Patient leans: N/A  Current Medications: Current Facility-Administered Medications  Medication Dose Route Frequency Provider Last Rate Last Dose  . acetaminophen (TYLENOL) tablet 650 mg  650 mg Oral Q6H PRN Waylan Boga, NP   650 mg at 11/17/13 1722  . alum & mag hydroxide-simeth (MAALOX/MYLANTA) 200-200-20 MG/5ML suspension 30 mL  30 mL Oral Q4H PRN Waylan Boga, NP      . chlordiazePOXIDE (LIBRIUM) capsule 25 mg  25 mg Oral Q6H PRN Waylan Boga, NP   25 mg at 11/18/13 2223  . chlordiazePOXIDE (LIBRIUM) capsule 25 mg  25 mg Oral BH-qamhs Waylan Boga, NP       Followed by  . [START ON 11/21/2013] chlordiazePOXIDE (LIBRIUM) capsule 25 mg  25 mg Oral Daily Waylan Boga, NP      . cloNIDine (CATAPRES) tablet 0.1 mg  0.1 mg Oral BH-qamhs  Waylan Boga, NP       Followed by  . [START ON 11/22/2013] cloNIDine (CATAPRES) tablet 0.1 mg  0.1 mg Oral QAC breakfast Waylan Boga, NP      . dicyclomine (BENTYL) tablet 20 mg  20 mg Oral Q6H PRN Waylan Boga, NP   20 mg at 11/18/13 2223  . hydrOXYzine (ATARAX/VISTARIL) tablet 25 mg  25 mg Oral Q6H PRN Waylan Boga, NP   25 mg at 11/17/13 2254  . loperamide (IMODIUM) capsule 2-4 mg  2-4 mg Oral PRN Waylan Boga, NP      . LORazepam (ATIVAN) tablet 1 mg  1 mg Oral Q8H PRN Waylan Boga, NP   1 mg at  11/18/13 2223  . magnesium hydroxide (MILK OF MAGNESIA) suspension 30 mL  30 mL Oral Daily PRN Waylan Boga, NP      . methocarbamol (ROBAXIN) tablet 500 mg  500 mg Oral Q8H PRN Waylan Boga, NP   500 mg at 11/18/13 2223  . multivitamin with minerals tablet 1 tablet  1 tablet Oral Daily Waylan Boga, NP   1 tablet at 11/18/13 1202  . naproxen (NAPROSYN) tablet 500 mg  500 mg Oral BID PRN Waylan Boga, NP      . nicotine (NICODERM CQ - dosed in mg/24 hours) patch 21 mg  21 mg Transdermal Daily Waylan Boga, NP   21 mg at 11/19/13 1252  . ondansetron (ZOFRAN-ODT) disintegrating tablet 4 mg  4 mg Oral Q6H PRN Waylan Boga, NP      . thiamine (B-1) injection 100 mg  100 mg Intramuscular Once Waylan Boga, NP        Lab Results: No results found for this or any previous visit (from the past 48 hour(s)).  Physical Findings: AIMS: Facial and Oral Movements Muscles of Facial Expression: None, normal Lips and Perioral Area: None, normal Jaw: None, normal Tongue: None, normal,Extremity Movements Upper (arms, wrists, hands, fingers): None, normal Lower (legs, knees, ankles, toes): None, normal, Trunk Movements Neck, shoulders, hips: None, normal, Overall Severity Severity of abnormal movements (highest score from questions above): None, normal Incapacitation due to abnormal movements: None, normal Patient's awareness of abnormal movements (rate only patient's report): No Awareness, Dental Status Current problems with teeth and/or dentures?: No Does patient usually wear dentures?: No  CIWA:  CIWA-Ar Total: 0 COWS:  COWS Total Score: 0  Treatment Plan Summary: Daily contact with patient to assess and evaluate symptoms and progress in treatment Medication management  Plan: Supportive approach/coping skills/relapse prevention           Pursue the detox further           Address the co morbidities  Medical Decision Making Problem Points:  Review of psycho-social stressors (1) Data Points:   Review of medication regiment & side effects (2)  I certify that inpatient services furnished can reasonably be expected to improve the patient's condition.   Erik [name] Crescenzo A 11/19/2013, 4:15 PM

## 2013-11-19 NOTE — Progress Notes (Signed)
The focus of this group is to help patients review their daily goal of treatment and discuss progress on daily workbooks. Pt stated his goal for the day was to plan for his discharge which is occurring tomorrow. Pt reported that he is somewhat scared to leave the hospital, but he is simultaneously ready to stop running from his problems. Pt stated he set up outpatient treatment before admitting inpatient, which he is excited to pursue. Pt acknowledged a need for structure and accountability in his life. Pt was engaged throughout group.
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
## 2013-11-19 NOTE — Progress Notes (Signed)
Nutrition Brief Note  Attempted to meet with pt as pt identified on malnutrition screening tool however pt in room with MD. Will attempt back later in the week as schedule allows.   Carlis Stable MS, [place], LDN [phone] Pager [phone] Weekend/After Hours Pager

## 2013-11-19 NOTE — Progress Notes (Signed)
D: Pt's manual BP was 90/40.  His clonidine was held at this time.  Pt. Reports that he didn't feel that he needed his librium.  He states that he is feeling "Awesome"  And would like to try to go the rest of the day without any medication. Pt. Went outside with peers.  He is present on the unit, interacting.  A: Support/encouragement given. R: Pt. Receptive, remains safe. Denies SI/HI.

## 2013-11-19 NOTE — Clinical Social Work Note (Signed)
CSW attempted to provide SPE to patient's mother Masin Shatto but no answer at this time. CSW will attempt to contact patient's mother at a later time.  Tilden Fossa, MSW, [place] Worker [facility] [phone]

## 2013-11-19 NOTE — BHH Group Notes (Signed)
[place] LCSW Group Therapy 11/19/2013  1:15 pm  Type of Therapy: Group Therapy Participation Level: Active  Participation Quality: Attentive, Sharing and Supportive  Affect: Appropriate  Cognitive: Alert and Oriented  Insight: Developing/Improving and Engaged  Engagement in Therapy: Developing/Improving and Engaged  Modes of Intervention: Clarification, Confrontation, Discussion, Education, Exploration,  Limit-setting, Orientation, Problem-solving, Rapport Building, Art therapist, Socialization and Support  Summary of Progress/Problems: Pt identified obstacles faced currently and processed barriers involved in overcoming these obstacles. Pt identified steps necessary for overcoming these obstacles and explored motivation (internal and external) for facing these difficulties head on. Pt further identified one area of concern in their lives and chose a goal to focus on for today. Patient reports that his primary obstacle is relapsing. He discussed experiencing feelings of fear and hopefulness and reports that he is ready for change.  Tilden Fossa, MSW, [place] Worker [facility] [phone]

## 2013-11-19 NOTE — Progress Notes (Signed)
D   Pt is appropriate and pleasant    His blood pressure was running a little high so pt was encouraged to take his clonidine which he had been refusing    He also refused the librium but then requested the ativan for sleep   He denies any symptoms of withdrawal at present A   Verbal support given   Medications administered and effectiveness monitored   Q15 min checks R   Pt safe at present and was receptive

## 2013-11-19 NOTE — BHH Group Notes (Signed)
[facility] LCSW Aftercare Discharge Planning Group Note  11/19/2013  8:45 AM  Participation Quality: Did Not Attend.  Tilden Fossa, MSW, [place] Worker [facility] [phone]

## 2013-11-20 LAB — HIV ANTIBODY (ROUTINE TESTING W REFLEX): HIV: NONREACTIVE

## 2013-11-20 LAB — HEPATITIS PANEL, ACUTE
HCV AB: NEGATIVE
HEP B S AG: POSITIVE — AB
Hep A IgM: NONREACTIVE
Hep B C IgM: NONREACTIVE

## 2013-11-20 LAB — HEPATITIS B SURF AG CONFIRMATION: HEPATITIS B SURFACE ANTIGEN CONFIRMATION: POSITIVE — AB

## 2013-11-20 LAB — RPR

## 2013-11-20 MED ORDER — LORAZEPAM 1 MG PO TABS: 1.0000 mg | ORAL_TABLET | Freq: Three times a day (TID) | ORAL | Status: DC | PRN

## 2013-11-20 NOTE — Clinical Social Work Note (Signed)
[facility] contacted and CSW informed that MD has not reviewed referral at this time.  CSW awaiting for MD to review referral before upcoming appointment can be scheduled.   Tilden Fossa, MSW, [place] Worker [facility] [phone]

## 2013-11-20 NOTE — BHH Suicide Risk Assessment (Signed)
Suicide Risk Assessment  Discharge Assessment     Demographic Factors:  Male and Adolescent or young adult  Total Time spent with patient: 30 minutes  Psychiatric Specialty Exam:     Blood pressure 107/63, pulse 55, temperature 98.2 F (36.8 C), temperature source Oral, resp. rate 16, height 5\' 4"  (1.626 m), weight 54.432 kg (120 lb).Body mass index is 20.59 kg/(m^2).  General Appearance: Fairly Groomed  Engineer, water::  Fair  Speech:  Clear and Coherent  Volume:  Normal  Mood:  Euthymic  Affect:  Appropriate  Thought Process:  Coherent and Goal Directed  Orientation:  Full (Time, Place, and Person)  Thought Content:  plans as he moves on, relapse prevention plan  Suicidal Thoughts:  No  Homicidal Thoughts:  No  Memory:  Immediate;   Fair Recent;   Fair Remote;   Fair  Judgement:  Fair  Insight:  Present  Psychomotor Activity:  Normal  Concentration:  Fair  Recall:  [employer] of Knowledge:NA  Language: Fair  Akathisia:  No  Handed:    AIMS (if indicated):     Assets:  Desire for Improvement Housing Social Support  Sleep:  Number of Hours: 5.5    Musculoskeletal: Strength & Muscle Tone: within normal limits Gait & Station: normal Patient leans: N/A   Mental Status Per Nursing Assessment::   On Admission:     Current Mental Status by Physician: In full contact with reality. There are no active S/S of withdrawal. No active SI plans or intent. He is willing and motivated to pursue outpatient treatment   Loss Factors: NA  Historical Factors: NA  Risk Reduction Factors:   Sense of responsibility to family, Living with another person, especially a relative and Positive social support  Continued Clinical Symptoms:  Alcohol/Substance Abuse/Dependencies  Cognitive Features That Contribute To Risk: None identified   Suicide Risk:  Minimal: No identifiable suicidal ideation.  Patients presenting with no risk factors but with morbid ruminations; may be  classified as minimal risk based on the severity of the depressive symptoms  Discharge Diagnoses:   AXIS I:  Polysubstance Dependence AXIS II:  No diagnosis AXIS III:   Past Medical History  Diagnosis Date  . Sinusitis   . Anxiety   . Hepatitis B    AXIS IV:  other psychosocial or environmental problems AXIS V:  61-70 mild symptoms  Plan Of Care/Follow-up recommendations:  Activity:  as tolerated Diet:  regular Follow up outpatient ([place]) Is patient on multiple antipsychotic therapies at discharge:  No   Has Patient had three or more failed trials of antipsychotic monotherapy by history:  No  Recommended Plan for Multiple Antipsychotic Therapies: NA    Natalyn Szymanowski A 11/20/2013, 11:37 AM

## 2013-11-20 NOTE — BHH Group Notes (Signed)
[place] LCSW Group Therapy  11/20/2013   1:15 PM   Type of Therapy:  Group Therapy  Participation Level:  Active  Participation Quality:  Attentive, Sharing and Supportive  Affect:  Depressed and Flat  Cognitive:  Alert and Oriented  Insight:  Developing/Improving and Engaged  Engagement in Therapy:  Developing/Improving and Engaged  Modes of Intervention:  Clarification, Confrontation, Discussion, Education, Exploration, Limit-setting, Orientation, Problem-solving, Rapport Building, Art therapist, Socialization and Support  Summary of Progress/Problems: The topic for group therapy was feelings about diagnosis.  Pt actively participated in group discussion on their past and current diagnosis and how they feel towards this.  Pt also identified how society and family members judge them, based on their diagnosis as well as stereotypes and stigmas.  Patient shared that he experiences feelings of hopelessness in that he will live with addiction for the rest of his life, but also hopeful that there is such a strong support network and treatment options for dealing with addiction. Patient reports that he has a stronger motivation to sustain his recovery than he has in the past and a strong support network. CSW's and other group members provided patient with emotional support and encouragement.  Tilden Fossa, MSW, [place] Worker [facility] [phone]

## 2013-11-20 NOTE — Tx Team (Signed)
Interdisciplinary Treatment Plan Update (Adult) Date: 11/20/2013   Time Reviewed: 9:30 AM  Progress in Treatment: Attending groups: CSW continuing to assess. Participating in groups: Yes Taking medication as prescribed: Yes Tolerating medication: Yes Family/Significant other contact made: CSW attempting to make contact with patient's mother. Patient understands diagnosis: Yes Discussing patient identified problems/goals with staff: Yes Medical problems stabilized or resolved: Yes Denies suicidal/homicidal ideation: Yes Issues/concerns per patient self-inventory: Yes Other:  New problem(s) identified: N/A  Discharge Plan or Barriers: Anticipated D/C today 9/15. Patient plans to follow up with [facility] and reports that he will be discharging home with his parents. He denies SI/HI and can contract for safety.  Reason for Continuation of Hospitalization:  Depression Anxiety Medication Stabilization   Comments: N/A  Estimated length of stay: Anticipated D/C today 9/15  For review of initial/current patient goals, please see plan of care.  Attendees: Patient:    Family:    Physician: Dr. Parke Poisson; Dr. Sabra Heck 11/20/2013 9:30 AM  Nursing: Luz Brazen, Grayland Ormond , RN 11/20/2013 9:30 AM  Clinical Social Worker: Tilden Fossa,  [place] 11/20/2013 9:30 AM  Other: Joette Catching, LCSW 11/20/2013 9:30 AM  Other: Lucinda Dell, [place] Liaison 11/20/2013 9:30 AM  Other: Lars Pinks, Case Manager 11/20/2013 9:30 AM  Other: Agustina Caroli, NP 11/20/2013 9:30 AM  Other: Edwyna Shell 11/20/2013 9:30 AM  Other:    Other:    Other:    Other:       Scribe for Treatment Team:  Tilden Fossa, MSW, [employer] [phone]

## 2013-11-20 NOTE — Progress Notes (Signed)
[facility] Adult Case Management Discharge Plan :  Will you be returning to the same living situation after discharge: No, patient will be returning home with his parents. At discharge, do you have transportation home?:Yes,  patient reports access to transportation. Do you have the ability to pay for your medications:Yes,  patient verbalizes his ability to obtain medications.  Release of information consent forms completed and in the chart;  Patient's signature needed at discharge.  Patient to Follow up at: Follow-up Information   Follow up with [facility]. (Please contact [facility] regarding referral sent on 11/20/13 and to schedule follow up appointment for therapy and medication management.)    Contact information:   [address] [place], [place] [address] Phone: [phone] Fax: [phone]      Patient denies SI/HI:   Yes,  denies    Safety Planning and Suicide Prevention discussed:  Yes,  with patient and mother.  Mads Borgmeyer, Casimiro Needle 11/20/2013, 12:09 PM

## 2013-11-20 NOTE — Discharge Summary (Signed)
Physician Discharge Summary Note  Patient:  Erik [name] is an 18 y.o., male MRN:  [id number] DOB:  1995/11/25 Patient phone:  [phone] (home)  Patient address:   [place] [id number],  Total Time spent with patient: Greater than 30 minutes  Date of Admission:  11/16/2013 Date of Discharge: 11/20/13  Reason for Admission:  Drug detox  Discharge Diagnoses: Active Problems:   Polysubstance dependence   Psychiatric Specialty Exam: Physical Exam  Psychiatric: His speech is normal. Judgment normal. His mood appears not anxious. His affect is not angry, not blunt, not labile and not inappropriate. He is not agitated, not aggressive, not hyperactive, not slowed, not withdrawn, not actively hallucinating and not combative. Thought content is not paranoid and not delusional. Cognition and memory are normal. He does not exhibit a depressed mood. He expresses no homicidal and no suicidal ideation. He expresses no suicidal plans and no homicidal plans. He is attentive.    Review of Systems  Constitutional: Negative.   HENT: Negative.   Eyes: Negative.   Respiratory: Negative.   Cardiovascular: Negative.   Gastrointestinal: Negative.   Genitourinary: Negative.   Musculoskeletal: Negative.   Skin: Negative.   Neurological: Negative.   Endo/Heme/Allergies: Negative.   Psychiatric/Behavioral: Positive for substance abuse (Polysubstance dependence). Negative for depression, suicidal ideas, hallucinations and memory loss. The patient is not nervous/anxious and does not have insomnia.     Blood pressure 120/66, pulse 71, temperature 98.2 F (36.8 C), temperature source Oral, resp. rate 16, height 5\' 4"  (1.626 m), weight 54.432 kg (120 lb).Body mass index is 20.59 kg/(m^2).   General Appearance: Fairly Groomed   Engineer, water:: Fair   Speech: Clear and Coherent   Volume: Normal   Mood: Euthymic   Affect: Appropriate   Thought Process: Coherent and Goal Directed    Orientation: Full (Time, Place, and Person)   Thought Content: plans as he moves on, relapse prevention plan   Suicidal Thoughts: No   Homicidal Thoughts: No   Memory: Immediate; Fair  Recent; Fair  Remote; Fair   Judgement: Fair   Insight: Present   Psychomotor Activity: Normal   Concentration: Fair   Recall: Erik [name]:NA   Language: Fair   Akathisia: No   Handed:   AIMS (if indicated):   Assets: Desire for Improvement  Housing  Social Support   Sleep: Number of Hours: 5.5    Past Psychiatric History: Diagnosis: Polysubstance dependence  Hospitalizations: [facility] adult unit  Outpatient Care: [facility]  Substance Abuse Care: [facility], [facility].  Self-Mutilation: NA  Suicidal Attempts: NA  Violent Behaviors:NA   Musculoskeletal: Strength & Muscle Tone: within normal limits Gait & Station: normal Patient leans: N/A  DSM5: Schizophrenia Disorders:  NA Obsessive-Compulsive Disorders:  NA Trauma-Stressor Disorders:  NA Substance/Addictive Disorders:  Polysubstance dependence Depressive Disorders:  Polysubstance dependence  Axis Diagnosis:  AXIS I:  Polysubstance dependence AXIS II:  Deferred AXIS III:   Past Medical History  Diagnosis Date  . Sinusitis   . Anxiety   . Hepatitis B    AXIS IV:  other psychosocial or environmental problems and Polysubstance dependence AXIS V:  64  Level of Care:  OP  Hospital Course: Erik [name] is an 18 y.o. male who came to [facility] requesting detox from benzodiazepines. He reported using multiple drugs including heroin, cocaine, molllys, benzos, and marijuana. Patient admits to depression and suicidal thoughts. The patient states "I started using needles. Then my life started spiraling. That  made me depressed. Then when I was high I was caught with an air-soft gun on campus. It was not even mine but I was holding it. They labeled me as a terrorist. My drug use is starting to have bad consequences.  I have not been thinking clearly."  Otho was admitted to the hospital with his UDS test reports showing positive Benzodiazepine, Amphetamine, cocaine and THC. He did admitted abusing multiple substances including Heroin & Mollys. Jovoni also reported depressed mood as well as suicidal ideations. He was in need of drug detoxification treatments. He received Librium detoxification treatment protocols and a brief Clonidine detox regimen to combat the withdrawal symptoms of both the Benzodiazepine and opioid. He also received Ativan 1 mg on a prn basis for the associated anxiety symptoms. Chaney was enrolled in the group counseling sessions and AA/NA meetings being offered and held on this unit. He learned coping skills. He presented no other serious medical issues that required treatments. He tolerated his treatment regimen without any adverse effects reported.  Deron has completed detox treatment and his mood is stable. This is evidenced by his reports of improved mood and absence of substance withdrawal symptoms. He is also denying any suicidal ideations. He is currently being discharged to continue substance abuse treatment at the [facility]. He is provided with all the necessary information required to make this appointment without problems. Wells is not on any routine medications.   Upon discharge, he adamantly denies any SIHI, AVH, delusional thoughts, paranoia and or withdrawal symptoms. He left [facility] with all personal belongings in no apparent distress. Transportation per patient's arrangement.  Consults:  psychiatry  Significant Diagnostic Studies:  labs: BCBC with diff, CMP, UDS, toxicology tests, U/A  Discharge Vitals:   Blood pressure 120/66, pulse 71, temperature 98.2 F (36.8 C), temperature source Oral, resp. rate 16, height 5\' 4"  (1.626 m), weight 54.432 kg (120 lb). Body mass index is 20.59 kg/(m^2). Lab Results:   Results for orders placed during the hospital encounter of  11/16/13 (from the past 72 hour(s))  HEPATITIS PANEL, ACUTE     Status: Abnormal   Collection Time    11/19/13  7:29 PM      Result Value Ref Range   Hepatitis B Surface Ag POSITIVE (*) NEGATIVE   Comment: Confirmation results to follow.   HCV Ab NEGATIVE  NEGATIVE   Hep A IgM NON REACTIVE  NON REACTIVE   Hep B C IgM NON REACTIVE  NON REACTIVE   Comment: (NOTE)     High levels of Hepatitis B Core IgM antibody are detectable     during the acute stage of Hepatitis B. This antibody is used     to differentiate current from past HBV infection.     Performed at [facility]  HIV ANTIBODY (ROUTINE TESTING)     Status: None   Collection Time    11/19/13  7:29 PM      Result Value Ref Range   HIV 1&2 Ab, 4th Generation NONREACTIVE  NONREACTIVE   Comment: (NOTE)     A NONREACTIVE HIV Ag/Ab result does not exclude HIV infection since     the time frame for seroconversion is variable. If acute HIV infection     is suspected, a HIV-1 RNA Qualitative TMA test is recommended.     HIV-1/2 Antibody Diff         Not indicated.     HIV-1 RNA, Qual TMA  Not indicated.     PLEASE NOTE: This information has been disclosed to you from records     whose confidentiality may be protected by state law. If your state     requires such protection, then the state law prohibits you from making     any further disclosure of the information without the specific written     consent of the person to whom it pertains, or as otherwise permitted     by law. A general authorization for the release of medical or other     information is NOT sufficient for this purpose.     The performance of this assay has not been clinically validated in     patients less than 33 years old.     Performed at [facility]  RPR     Status: None   Collection Time    11/19/13  7:29 PM      Result Value Ref Range   RPR NON REAC  NON REAC   Comment: Performed at [facility]     Status: Abnormal   Collection Time    11/19/13  7:29 PM      Result Value Ref Range   Hepatitis B Surf Ag Confirmation POSITIVE (*) NEGATIVE   Comment: Confirmed by neutralization.     Performed at [facility]     Status: None   Collection Time    11/19/13  8:13 PM      Result Value Ref Range   CT Probe RNA NEGATIVE  NEGATIVE   GC Probe RNA NEGATIVE  NEGATIVE   Comment: (NOTE)                                                                                               **Normal Reference Range: Negative**          Assay performed using the Gen-Probe APTIMA COMBO2 (R) Assay.     Acceptable specimen types for this assay include APTIMA Swabs (Unisex,     endocervical, urethral, or vaginal), first void urine, and ThinPrep     liquid based cytology samples.     Performed at [facility]    Physical Findings: AIMS: Facial and Oral Movements Muscles of Facial Expression: None, normal Lips and Perioral Area: None, normal Jaw: None, normal Tongue: None, normal,Extremity Movements Upper (arms, wrists, hands, fingers): None, normal Lower (legs, knees, ankles, toes): None, normal, Trunk Movements Neck, shoulders, hips: None, normal, Overall Severity Severity of abnormal movements (highest score from questions above): None, normal Incapacitation due to abnormal movements: None, normal Patient's awareness of abnormal movements (rate only patient's report): No Awareness, Dental Status Current problems with teeth and/or dentures?: No Does patient usually wear dentures?: No  CIWA:  CIWA-Ar Total: 1 COWS:  COWS Total Score: 0  Psychiatric Specialty Exam: See Psychiatric Specialty Exam and Suicide Risk Assessment completed by Attending Physician prior to discharge.  Discharge destination:  Home  Is patient on multiple antipsychotic therapies at discharge:  No   Has Patient had three or  more failed trials of antipsychotic monotherapy  by history:  No  Recommended Plan for Multiple Antipsychotic Therapies: NA    Medication List    Notice   You have not been prescribed any medications.     Follow-up Information   Follow up with [facility]. (Please contact [facility] regarding referral sent on 11/20/13 and to schedule follow up appointment for therapy and medication management.)    Contact information:   [address] [place],  [address] Phone: [phone] Fax: [phone]    Follow-up recommendations:  Activity:  As tolerated Diet: As recommended by your primary care doctor. Keep all scheduled follow-up appointments as recommended.  Comments: Take all your medications as prescribed by your mental healthcare provider. Report any adverse effects and or reactions from your medicines to your outpatient provider promptly. Patient is instructed and cautioned to not engage in alcohol and or illegal drug use while on prescription medicines. In the event of worsening symptoms, patient is instructed to call the crisis hotline, 911 and or go to the nearest ED for appropriate evaluation and treatment of symptoms. Follow-up with your primary care provider for your other medical issues, concerns and or health care needs.    Total Discharge Time:  Greater than 30 minutes.  Signed: Encarnacion Slates, PMHNP-BC 11/21/2013, 9:10 AM I personally assessed the patient and formulated the plan Geralyn Flash A. Sabra Heck, M.D.

## 2013-11-20 NOTE — Progress Notes (Signed)
Pt alert and oriented x4. Pt calm and cooperative. However, Pt did not take any meds this morning. Pt did attend treatment groups. Pt has been energetic and excited about discharge. Pt plans to follow up with outpatient services and sounds hopeful. Pt denies any SI/HI/AH/VH. Pt denies having any concerns or questions. Pt has discharged, picked up by friends.

## 2013-11-20 NOTE — BHH Suicide Risk Assessment (Signed)
[place] INPATIENT:  Family/Significant Other Suicide Prevention Education  Suicide Prevention Education:  Education Completed; mother Cedrick Partain [phone],  (name of family member/significant other) has been identified by the patient as the family member/significant other with whom the patient will be residing, and identified as the person(s) who will aid the patient in the event of a mental health crisis (suicidal ideations/suicide attempt).  With written consent from the patient, the family member/significant other has been provided the following suicide prevention education, prior to the and/or following the discharge of the patient.  The suicide prevention education provided includes the following:  Suicide risk factors  Suicide prevention and interventions  National Suicide Hotline telephone number  [facility] assessment telephone number  [facility] Emergency Assistance [place] and/or Residential Mobile Crisis Unit telephone number  Request made of family/significant other to:  Remove weapons (e.g., guns, rifles, knives), all items previously/currently identified as safety concern.    Remove drugs/medications (over-the-counter, prescriptions, illicit drugs), all items previously/currently identified as a safety concern.  The family member/significant other verbalizes understanding of the suicide prevention education information provided.  The family member/significant other agrees to remove the items of safety concern listed above.  Quinten Allerton, Casimiro Needle 11/20/2013, 10:52 AM

## 2013-11-21 LAB — GC/CHLAMYDIA PROBE AMP
CT Probe RNA: NEGATIVE
GC Probe RNA: NEGATIVE

## 2013-11-22 NOTE — Progress Notes (Signed)
Patient Discharge Instructions:  No documentation was faxed for [facility]IPS.  Per the sw documentation was already sent to [facility].  Patsey Berthold, 11/22/2013, 3:21 PM

## 2014-02-27 ENCOUNTER — Observation Stay: Payer: Self-pay | Admitting: Internal Medicine

## 2014-02-27 LAB — URINALYSIS, COMPLETE
BACTERIA: NONE SEEN
BLOOD: NEGATIVE
Bilirubin,UR: NEGATIVE
GLUCOSE, UR: NEGATIVE mg/dL (ref 0–75)
KETONE: NEGATIVE
Leukocyte Esterase: NEGATIVE
NITRITE: NEGATIVE
PH: 6 (ref 4.5–8.0)
Protein: NEGATIVE
RBC,UR: NONE SEEN /HPF (ref 0–5)
SPECIFIC GRAVITY: 1.025 (ref 1.003–1.030)
Squamous Epithelial: <1

## 2014-02-27 LAB — COMPREHENSIVE METABOLIC PANEL
ALBUMIN: 4 g/dL (ref 3.4–5.0)
ALK PHOS: 75 U/L
ALT: 24 U/L
ANION GAP: 7 (ref 7–16)
BILIRUBIN TOTAL: 0.6 mg/dL (ref 0.2–1.0)
BUN: 16 mg/dL (ref 7–18)
CREATININE: 0.94 mg/dL (ref 0.60–1.30)
Calcium, Total: 8.8 mg/dL (ref 8.5–10.1)
Chloride: 106 mmol/L (ref 98–107)
Co2: 26 mmol/L (ref 21–32)
EGFR (Non-African Amer.): >60
Glucose: 86 mg/dL (ref 65–99)
OSMOLALITY: 278 (ref 275–301)
Potassium: 3.6 mmol/L (ref 3.5–5.1)
SGOT(AST): 30 U/L (ref 15–37)
SODIUM: 139 mmol/L (ref 136–145)
Total Protein: 7.8 g/dL (ref 6.4–8.2)

## 2014-02-27 LAB — CBC
HCT: 48.6 % (ref 40.0–52.0)
HGB: 16 g/dL (ref 13.0–18.0)
MCH: 30.3 pg (ref 26.0–34.0)
MCHC: 33 g/dL (ref 32.0–36.0)
MCV: 92 fL (ref 80–100)
Platelet: 244 10*3/uL (ref 150–440)
RBC: 5.28 10*6/uL (ref 4.40–5.90)
RDW: 13.8 % (ref 11.5–14.5)
WBC: 9.6 10*3/uL (ref 3.8–10.6)

## 2014-02-27 LAB — DRUG SCREEN, URINE
Amphetamines, Ur Screen: NEGATIVE (ref ?–1000)
BENZODIAZEPINE, UR SCRN: POSITIVE (ref ?–200)
Barbiturates, Ur Screen: NEGATIVE (ref ?–200)
CANNABINOID 50 NG, UR ~~LOC~~: POSITIVE (ref ?–50)
COCAINE METABOLITE, UR ~~LOC~~: NEGATIVE (ref ?–300)
MDMA (Ecstasy)Ur Screen: NEGATIVE (ref ?–500)
METHADONE, UR SCREEN: NEGATIVE (ref ?–300)
Opiate, Ur Screen: NEGATIVE (ref ?–300)
Phencyclidine (PCP) Ur S: NEGATIVE (ref ?–25)
TRICYCLIC, UR SCREEN: NEGATIVE (ref ?–1000)

## 2014-02-27 LAB — ACETAMINOPHEN LEVEL

## 2014-02-27 LAB — CK-MB
CK-MB: 1.2 ng/mL (ref 0.5–3.6)
CK-MB: 1.4 ng/mL (ref 0.5–3.6)

## 2014-02-27 LAB — TROPONIN I
Troponin-I: <0.02 ng/mL
Troponin-I: <0.02 ng/mL

## 2014-02-27 LAB — ETHANOL: Ethanol: <3 mg/dL

## 2014-02-27 LAB — SALICYLATE LEVEL
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
## 2014-07-05 ENCOUNTER — Encounter (HOSPITAL_COMMUNITY): Payer: Self-pay | Admitting: Physical Medicine and Rehabilitation

## 2014-07-05 ENCOUNTER — Emergency Department (HOSPITAL_COMMUNITY)
Admission: EM | Admit: 2014-07-05 | Discharge: 2014-07-05 | Disposition: A | Discharge status: Home / Self Care | Payer: 59 | Attending: Emergency Medicine | Admitting: Emergency Medicine

## 2014-07-05 DIAGNOSIS — Y9289 Other specified places as the place of occurrence of the external cause: Secondary | ICD-10-CM | POA: Insufficient documentation

## 2014-07-05 DIAGNOSIS — Y939 Activity, unspecified: Secondary | ICD-10-CM | POA: Insufficient documentation

## 2014-07-05 DIAGNOSIS — R4182 Altered mental status, unspecified: Secondary | ICD-10-CM | POA: Diagnosis present

## 2014-07-05 DIAGNOSIS — F1910 Other psychoactive substance abuse, uncomplicated: Secondary | ICD-10-CM | POA: Insufficient documentation

## 2014-07-05 DIAGNOSIS — Z720 Tobacco use: Secondary | ICD-10-CM | POA: Diagnosis not present

## 2014-07-05 DIAGNOSIS — Z880 Allergy status to penicillin: Secondary | ICD-10-CM | POA: Diagnosis not present

## 2014-07-05 DIAGNOSIS — T50901A Poisoning by unspecified drugs, medicaments and biological substances, accidental (unintentional), initial encounter: Secondary | ICD-10-CM | POA: Diagnosis not present

## 2014-07-05 DIAGNOSIS — Z8709 Personal history of other diseases of the respiratory system: Secondary | ICD-10-CM | POA: Insufficient documentation

## 2014-07-05 DIAGNOSIS — W19XXXA Unspecified fall, initial encounter: Secondary | ICD-10-CM | POA: Diagnosis not present

## 2014-07-05 DIAGNOSIS — Z8619 Personal history of other infectious and parasitic diseases: Secondary | ICD-10-CM | POA: Diagnosis not present

## 2014-07-05 LAB — CBC WITH DIFFERENTIAL/PLATELET
BASOS PCT: 0 % (ref 0–1)
Basophils Absolute: 0 10*3/uL (ref 0.0–0.1)
Eosinophils Absolute: 0 10*3/uL (ref 0.0–0.7)
Eosinophils Relative: 0 % (ref 0–5)
HCT: 41.3 % (ref 39.0–52.0)
Hemoglobin: 14.4 g/dL (ref 13.0–17.0)
LYMPHS ABS: 0.5 10*3/uL — AB (ref 0.7–4.0)
Lymphocytes Relative: 3 % — ABNORMAL LOW (ref 12–46)
MCH: 30.9 pg (ref 26.0–34.0)
MCHC: 34.9 g/dL (ref 30.0–36.0)
MCV: 88.6 fL (ref 78.0–100.0)
Monocytes Absolute: 0.2 10*3/uL (ref 0.1–1.0)
Monocytes Relative: 1 % — ABNORMAL LOW (ref 3–12)
NEUTROS ABS: 19.4 10*3/uL — AB (ref 1.7–7.7)
NEUTROS PCT: 96 % — AB (ref 43–77)
PLATELETS: 220 10*3/uL (ref 150–400)
RBC: 4.66 MIL/uL (ref 4.22–5.81)
RDW: 12.4 % (ref 11.5–15.5)
WBC: 20.1 10*3/uL — ABNORMAL HIGH (ref 4.0–10.5)

## 2014-07-05 LAB — I-STAT CHEM 8, ED
BUN: 10 mg/dL (ref 6–23)
CHLORIDE: 98 mmol/L (ref 96–112)
CREATININE: 1 mg/dL (ref 0.50–1.35)
Calcium, Ion: 1.01 mmol/L — ABNORMAL LOW (ref 1.12–1.23)
Glucose, Bld: 178 mg/dL — ABNORMAL HIGH (ref 70–99)
HCT: 46 % (ref 39.0–52.0)
HEMOGLOBIN: 15.6 g/dL (ref 13.0–17.0)
POTASSIUM: 4.3 mmol/L (ref 3.5–5.1)
SODIUM: 134 mmol/L — AB (ref 135–145)
TCO2: 22 mmol/L (ref 0–100)

## 2014-07-05 LAB — I-STAT CG4 LACTIC ACID, ED: Lactic Acid, Venous: 2.8 mmol/L (ref 0.5–2.0)

## 2014-07-05 NOTE — ED Notes (Signed)
Pt sleeping but  Woke up when his name was called.  His sats low 91 from sleeping family at the bedside.

## 2014-07-05 NOTE — Discharge Instructions (Signed)
Drug Abuse and Addiction in Sports °There are many types of drugs that one may become addicted to including illegal drugs (marijuana, cocaine, amphetamines, hallucinogens, and narcotics), prescription drugs (hydrocodone, codeine, and alprazolam), and other chemicals such as alcohol or nicotine. °Two types of addiction exist: physical and emotional. Physical addiction usually occurs after prolonged use of a drug. However, some drugs may only take a couple uses before addiction can occur. Physical addiction is marked by withdrawal symptoms in which the person experiences negative symptoms such as sweat, anxiety, tremors, hallucinations, or cravings in the absence of using the drug. Emotional dependence is the psychological desire for the "high" that the drugs produce when taken. °SYMPTOMS  °· Inattentiveness. °· Negligence. °· Forgetfulness. °· Insomnia. °· Mood swings. °RISK INCREASES WITH:  °· Family history of addiction. °· Personal history of addictive personality. °Studies have shown that risk takers, which many athletes are, have a higher risk of addiction. °PREVENTION °The only adequate prevention of drug abuse is abstinence from drugs. °TREATMENT  °The first step in quitting substance abuse is recognizing the problem and realizing that one has the power to change. Quitting requires a plan and support from others. It is often necessary to seek medical assistance. Caregivers are available to offer counseling, and for certain cases, medicine to diminish the physical symptoms of withdrawal. Many organizations exist such as Alcoholics Anonymous, Narcotics Anonymous, or the National Council on Alcoholism that offer support for individuals who have chosen to quit their habits. °Document Released: 02/22/2005 Document Revised: 07/09/2013 Document Reviewed: 06/06/2008 °ExitCare® Patient Information ©2015 [employer]. This information is not intended to replace advice given to you by your health care provider. Make  sure you discuss any questions you have with your health care provider. ° °
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
## 2014-07-05 NOTE — ED Notes (Signed)
Pt asking repetative questions at the time. He is oriented to self, but does not remember exact events. Admits to using marijuana today. History of drug use and Hep B. Pt denies pain.

## 2014-07-05 NOTE — Progress Notes (Signed)
Pt on RA. SPO2 97%. RN aware. RT will continue to monitor.
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
## 2014-09-27 ENCOUNTER — Encounter (HOSPITAL_COMMUNITY): Payer: Self-pay | Admitting: Emergency Medicine

## 2014-09-27 ENCOUNTER — Emergency Department (HOSPITAL_COMMUNITY)
Admission: EM | Admit: 2014-09-27 | Discharge: 2014-09-27 | Disposition: A | Discharge status: Home / Self Care | Payer: 59 | Attending: Emergency Medicine | Admitting: Emergency Medicine

## 2014-09-27 DIAGNOSIS — Y9389 Activity, other specified: Secondary | ICD-10-CM | POA: Insufficient documentation

## 2014-09-27 DIAGNOSIS — Z8659 Personal history of other mental and behavioral disorders: Secondary | ICD-10-CM | POA: Insufficient documentation

## 2014-09-27 DIAGNOSIS — T2010XA Burn of first degree of head, face, and neck, unspecified site, initial encounter: Secondary | ICD-10-CM | POA: Diagnosis not present

## 2014-09-27 DIAGNOSIS — Y998 Other external cause status: Secondary | ICD-10-CM | POA: Diagnosis not present

## 2014-09-27 DIAGNOSIS — Z880 Allergy status to penicillin: Secondary | ICD-10-CM | POA: Diagnosis not present

## 2014-09-27 DIAGNOSIS — Z720 Tobacco use: Secondary | ICD-10-CM | POA: Insufficient documentation

## 2014-09-27 DIAGNOSIS — Y9289 Other specified places as the place of occurrence of the external cause: Secondary | ICD-10-CM | POA: Insufficient documentation

## 2014-09-27 DIAGNOSIS — X158XXA Contact with other hot household appliances, initial encounter: Secondary | ICD-10-CM | POA: Insufficient documentation

## 2014-09-27 DIAGNOSIS — T2000XA Burn of unspecified degree of head, face, and neck, unspecified site, initial encounter: Secondary | ICD-10-CM | POA: Diagnosis present

## 2014-09-27 DIAGNOSIS — Z8619 Personal history of other infectious and parasitic diseases: Secondary | ICD-10-CM | POA: Diagnosis not present

## 2014-09-27 MED ORDER — CLINDAMYCIN HCL 150 MG PO CAPS: 300.0000 mg | ORAL_CAPSULE | Freq: Three times a day (TID) | ORAL | Status: DC

## 2014-09-27 MED ORDER — HYDROCODONE-ACETAMINOPHEN 5-325 MG PO TABS
1.0000 | ORAL_TABLET | Freq: Once | ORAL | Status: AC
Start: 2014-09-27 — End: 2014-09-27
  Administered 2014-09-27: 1 via ORAL
  Filled 2014-09-27: qty 1

## 2014-09-27 MED ORDER — HYDROCODONE-ACETAMINOPHEN 5-325 MG PO TABS: 1.0000 | ORAL_TABLET | ORAL | Status: DC | PRN

## 2014-09-27 MED ORDER — IBUPROFEN 800 MG PO TABS: 800.0000 mg | ORAL_TABLET | Freq: Three times a day (TID) | ORAL | Status: DC

## 2014-09-27 MED ORDER — IBUPROFEN 800 MG PO TABS
800.0000 mg | ORAL_TABLET | Freq: Once | ORAL | Status: AC
  Administered 2014-09-27: 800 mg via ORAL
  Filled 2014-09-27: qty 1
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
## 2014-09-27 NOTE — Discharge Instructions (Signed)
Burn Care Your skin is a natural barrier to infection. It is the largest organ of your body. Burns damage this natural protection. To help prevent infection, it is very important to follow your caregiver's instructions in the care of your burn. Burns are classified as:  First degree. There is only redness of the skin (erythema). No scarring is expected.  Second degree. There is blistering of the skin. Scarring may occur with deeper burns.  Third degree. All layers of the skin are injured, and scarring is expected. HOME CARE INSTRUCTIONS   Wash your hands well before changing your bandage.  Change your bandage as often as directed by your caregiver.  Remove the old bandage. If the bandage sticks, you may soak it off with cool, clean water.  Cleanse the burn thoroughly but gently with mild soap and water.  Pat the area dry with a clean, dry cloth.  Apply a thin layer of antibacterial cream to the burn.  Apply a clean bandage as instructed by your caregiver.  Keep the bandage as clean and dry as possible.  Elevate the affected area for the first 24 hours, then as instructed by your caregiver.  Only take over-the-counter or prescription medicines for pain, discomfort, or fever as directed by your caregiver. SEEK IMMEDIATE MEDICAL CARE IF:   You develop excessive pain.  You develop redness, tenderness, swelling, or red streaks near the burn.  The burned area develops yellowish-white fluid (pus) or a bad smell.  You have a fever. MAKE SURE YOU:   Understand these instructions.  Will watch your condition.  Will get help right away if you are not doing well or get worse. Document Released: 02/22/2005 Document Revised: 05/17/2011 Document Reviewed: 07/15/2010 ExitCare Patient Information 2015 [employer]. This information is not intended to replace advice given to you by your health care provider. Make sure you discuss any questions you have with your health care  provider.  
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
## 2014-09-30 ENCOUNTER — Emergency Department (HOSPITAL_COMMUNITY): Admission: EM | Admit: 2014-09-30 | Discharge: 2014-09-30 | Discharge status: Absent without leave | Payer: 59

## 2014-09-30 ENCOUNTER — Encounter (HOSPITAL_COMMUNITY): Payer: Self-pay | Admitting: *Deleted

## 2014-09-30 NOTE — ED Notes (Signed)
Pt here for drug test-pt's friend states they live in a recovery house and is required to have a drug test.

## 2014-10-22 NOTE — ED Provider Notes (Signed)
Please see my addendum on the April notes.  Varney Biles, MD 12/27/14 1627

## 2014-10-22 NOTE — ED Provider Notes (Signed)
I recall seeing this patient in April. I remember EMS reports, pt's condition at presentation, speaking with patient's father - who i am sure works for our hospital institution, giving update to the patient as he became more coherent and patient's parents at the time of discharge.  The diagnosis for that encounter is as below:    ICD-9-CM ICD-10-CM   1. Substance abuse 305.90 F19.10   2. Drug overdose, accidental or unintentional, initial encounter 977.9 T50.901A    E858.9       Varney Biles, MD 10/22/14 2352

## 2014-11-08 ENCOUNTER — Encounter (HOSPITAL_COMMUNITY): Payer: Self-pay | Admitting: *Deleted

## 2014-11-08 ENCOUNTER — Inpatient Hospital Stay (HOSPITAL_COMMUNITY)
Admission: EM | Admit: 2014-11-08 | Discharge: 2014-11-16 | DRG: 871 | Disposition: A | Discharge status: Home / Self Care | Payer: 59 | Attending: Internal Medicine | Admitting: Internal Medicine

## 2014-11-08 ENCOUNTER — Emergency Department (HOSPITAL_COMMUNITY): Payer: 59

## 2014-11-08 ENCOUNTER — Inpatient Hospital Stay (HOSPITAL_COMMUNITY): Payer: 59

## 2014-11-08 DIAGNOSIS — F17200 Nicotine dependence, unspecified, uncomplicated: Secondary | ICD-10-CM | POA: Diagnosis present

## 2014-11-08 DIAGNOSIS — M79605 Pain in left leg: Secondary | ICD-10-CM | POA: Diagnosis not present

## 2014-11-08 DIAGNOSIS — R7989 Other specified abnormal findings of blood chemistry: Secondary | ICD-10-CM

## 2014-11-08 DIAGNOSIS — R079 Chest pain, unspecified: Secondary | ICD-10-CM

## 2014-11-08 DIAGNOSIS — B181 Chronic viral hepatitis B without delta-agent: Secondary | ICD-10-CM

## 2014-11-08 DIAGNOSIS — J9601 Acute respiratory failure with hypoxia: Secondary | ICD-10-CM | POA: Diagnosis present

## 2014-11-08 DIAGNOSIS — I319 Disease of pericardium, unspecified: Secondary | ICD-10-CM | POA: Diagnosis present

## 2014-11-08 DIAGNOSIS — M79609 Pain in unspecified limb: Secondary | ICD-10-CM

## 2014-11-08 DIAGNOSIS — N179 Acute kidney failure, unspecified: Secondary | ICD-10-CM

## 2014-11-08 DIAGNOSIS — F17210 Nicotine dependence, cigarettes, uncomplicated: Secondary | ICD-10-CM | POA: Diagnosis present

## 2014-11-08 DIAGNOSIS — D6959 Other secondary thrombocytopenia: Secondary | ICD-10-CM | POA: Diagnosis present

## 2014-11-08 DIAGNOSIS — R6521 Severe sepsis with septic shock: Secondary | ICD-10-CM | POA: Diagnosis not present

## 2014-11-08 DIAGNOSIS — F1990 Other psychoactive substance use, unspecified, uncomplicated: Secondary | ICD-10-CM

## 2014-11-08 DIAGNOSIS — I519 Heart disease, unspecified: Secondary | ICD-10-CM | POA: Diagnosis not present

## 2014-11-08 DIAGNOSIS — E875 Hyperkalemia: Secondary | ICD-10-CM | POA: Diagnosis present

## 2014-11-08 DIAGNOSIS — T401X1A Poisoning by heroin, accidental (unintentional), initial encounter: Secondary | ICD-10-CM | POA: Diagnosis present

## 2014-11-08 DIAGNOSIS — E872 Acidosis: Secondary | ICD-10-CM | POA: Diagnosis present

## 2014-11-08 DIAGNOSIS — E8770 Fluid overload, unspecified: Secondary | ICD-10-CM | POA: Diagnosis not present

## 2014-11-08 DIAGNOSIS — E876 Hypokalemia: Secondary | ICD-10-CM | POA: Diagnosis not present

## 2014-11-08 DIAGNOSIS — G9340 Encephalopathy, unspecified: Secondary | ICD-10-CM | POA: Diagnosis present

## 2014-11-08 DIAGNOSIS — Z452 Encounter for adjustment and management of vascular access device: Secondary | ICD-10-CM | POA: Insufficient documentation

## 2014-11-08 DIAGNOSIS — I509 Heart failure, unspecified: Secondary | ICD-10-CM | POA: Diagnosis not present

## 2014-11-08 DIAGNOSIS — K7200 Acute and subacute hepatic failure without coma: Secondary | ICD-10-CM | POA: Diagnosis present

## 2014-11-08 DIAGNOSIS — I4510 Unspecified right bundle-branch block: Secondary | ICD-10-CM | POA: Diagnosis present

## 2014-11-08 DIAGNOSIS — I5181 Takotsubo syndrome: Secondary | ICD-10-CM | POA: Diagnosis present

## 2014-11-08 DIAGNOSIS — N170 Acute kidney failure with tubular necrosis: Secondary | ICD-10-CM | POA: Diagnosis present

## 2014-11-08 DIAGNOSIS — T796XXD Traumatic ischemia of muscle, subsequent encounter: Secondary | ICD-10-CM | POA: Diagnosis not present

## 2014-11-08 DIAGNOSIS — A419 Sepsis, unspecified organism: Principal | ICD-10-CM | POA: Diagnosis present

## 2014-11-08 DIAGNOSIS — E871 Hypo-osmolality and hyponatremia: Secondary | ICD-10-CM | POA: Diagnosis present

## 2014-11-08 DIAGNOSIS — T796XXA Traumatic ischemia of muscle, initial encounter: Secondary | ICD-10-CM | POA: Diagnosis not present

## 2014-11-08 DIAGNOSIS — R778 Other specified abnormalities of plasma proteins: Secondary | ICD-10-CM

## 2014-11-08 DIAGNOSIS — X58XXXA Exposure to other specified factors, initial encounter: Secondary | ICD-10-CM | POA: Diagnosis present

## 2014-11-08 DIAGNOSIS — T50901A Poisoning by unspecified drugs, medicaments and biological substances, accidental (unintentional), initial encounter: Secondary | ICD-10-CM | POA: Diagnosis not present

## 2014-11-08 DIAGNOSIS — Z8619 Personal history of other infectious and parasitic diseases: Secondary | ICD-10-CM | POA: Diagnosis not present

## 2014-11-08 DIAGNOSIS — D696 Thrombocytopenia, unspecified: Secondary | ICD-10-CM | POA: Diagnosis present

## 2014-11-08 DIAGNOSIS — I5021 Acute systolic (congestive) heart failure: Secondary | ICD-10-CM | POA: Diagnosis not present

## 2014-11-08 LAB — BLOOD GAS, ARTERIAL
ACID-BASE DEFICIT: 4.6 mmol/L — AB (ref 0.0–2.0)
Acid-base deficit: 10.5 mmol/L — ABNORMAL HIGH (ref 0.0–2.0)
BICARBONATE: 15.3 meq/L — AB (ref 20.0–24.0)
Bicarbonate: 19.1 mEq/L — ABNORMAL LOW (ref 20.0–24.0)
DRAWN BY: 257701
FIO2: 0.21
O2 CONTENT: 2 L/min
O2 SAT: 94.2 %
O2 Saturation: 98.3 %
PCO2 ART: 34.6 mmHg — AB (ref 35.0–45.0)
PH ART: 7.265 — AB (ref 7.350–7.450)
PO2 ART: 132 mmHg — AB (ref 80.0–100.0)
PO2 ART: 80.4 mmHg (ref 80.0–100.0)
Patient temperature: 98.6
Patient temperature: 100.4
TCO2: 13.9 mmol/L (ref 0–100)
TCO2: 17 mmol/L (ref 0–100)
pCO2 arterial: 35 mmHg (ref 35.0–45.0)
pH, Arterial: 7.367 (ref 7.350–7.450)

## 2014-11-08 LAB — CBC WITH DIFFERENTIAL/PLATELET
BASOS PCT: 0 % (ref 0–1)
Basophils Absolute: 0 10*3/uL (ref 0.0–0.1)
EOS ABS: 0 10*3/uL (ref 0.0–0.7)
EOS PCT: 0 % (ref 0–5)
HCT: 47 % (ref 39.0–52.0)
Hemoglobin: 16 g/dL (ref 13.0–17.0)
Lymphocytes Relative: 6 % — ABNORMAL LOW (ref 12–46)
Lymphs Abs: 1.9 10*3/uL (ref 0.7–4.0)
MCH: 30.5 pg (ref 26.0–34.0)
MCHC: 34 g/dL (ref 30.0–36.0)
MCV: 89.5 fL (ref 78.0–100.0)
MONO ABS: 2.8 10*3/uL — AB (ref 0.1–1.0)
Monocytes Relative: 9 % (ref 3–12)
NEUTROS ABS: 26.7 10*3/uL — AB (ref 1.7–7.7)
Neutrophils Relative %: 85 % — ABNORMAL HIGH (ref 43–77)
PLATELETS: ADEQUATE 10*3/uL (ref 150–400)
RBC: 5.25 MIL/uL (ref 4.22–5.81)
RDW: 12.2 % (ref 11.5–15.5)
WBC: 31.4 10*3/uL — ABNORMAL HIGH (ref 4.0–10.5)

## 2014-11-08 LAB — TYPE AND SCREEN
ABO/RH(D): A POS
ANTIBODY SCREEN: NEGATIVE

## 2014-11-08 LAB — LACTIC ACID, PLASMA
LACTIC ACID, VENOUS: 4.4 mmol/L — AB (ref 0.5–2.0)
LACTIC ACID, VENOUS: 4.7 mmol/L — AB (ref 0.5–2.0)
LACTIC ACID, VENOUS: 1.8 mmol/L (ref 0.5–2.0)
Lactic Acid, Venous: 1.8 mmol/L (ref 0.5–2.0)

## 2014-11-08 LAB — URINALYSIS, ROUTINE W REFLEX MICROSCOPIC
Bilirubin Urine: NEGATIVE
GLUCOSE, UA: NEGATIVE mg/dL
KETONES UR: NEGATIVE mg/dL
Leukocytes, UA: NEGATIVE
Nitrite: NEGATIVE
PH: 5 (ref 5.0–8.0)
Specific Gravity, Urine: 1.019 (ref 1.005–1.030)
Urobilinogen, UA: 0.2 mg/dL (ref 0.0–1.0)

## 2014-11-08 LAB — BASIC METABOLIC PANEL
ANION GAP: 10 (ref 5–15)
ANION GAP: 10 (ref 5–15)
BUN: 35 mg/dL — AB (ref 6–20)
BUN: 37 mg/dL — ABNORMAL HIGH (ref 6–20)
CALCIUM: 6.9 mg/dL — AB (ref 8.9–10.3)
CO2: 19 mmol/L — AB (ref 22–32)
CO2: 26 mmol/L (ref 22–32)
CREATININE: 2.94 mg/dL — AB (ref 0.61–1.24)
Calcium: 6.7 mg/dL — ABNORMAL LOW (ref 8.9–10.3)
Chloride: 108 mmol/L (ref 101–111)
Chloride: 101 mmol/L (ref 101–111)
Creatinine, Ser: 2.84 mg/dL — ABNORMAL HIGH (ref 0.61–1.24)
GFR calc Af Amer: 35 mL/min — ABNORMAL LOW (ref >60)
GFR calc non Af Amer: 30 mL/min — ABNORMAL LOW (ref >60)
GFR, EST AFRICAN AMERICAN: 33 mL/min — AB (ref >60)
GFR, EST NON AFRICAN AMERICAN: 29 mL/min — AB (ref >60)
GLUCOSE: 296 mg/dL — AB (ref 65–99)
Glucose, Bld: 104 mg/dL — ABNORMAL HIGH (ref 65–99)
POTASSIUM: 4.2 mmol/L (ref 3.5–5.1)
Potassium: 3.3 mmol/L — ABNORMAL LOW (ref 3.5–5.1)
SODIUM: 137 mmol/L (ref 135–145)
Sodium: 137 mmol/L (ref 135–145)

## 2014-11-08 LAB — COMPREHENSIVE METABOLIC PANEL
ALBUMIN: 4.8 g/dL (ref 3.5–5.0)
ALBUMIN: 3.6 g/dL (ref 3.5–5.0)
ALK PHOS: 145 U/L — AB (ref 38–126)
ALT: 141 U/L — AB (ref 17–63)
ALT: 121 U/L — ABNORMAL HIGH (ref 17–63)
ANION GAP: 12 (ref 5–15)
AST: 226 U/L — AB (ref 15–41)
AST: 234 U/L — ABNORMAL HIGH (ref 15–41)
Alkaline Phosphatase: 70 U/L (ref 38–126)
Anion gap: 19 — ABNORMAL HIGH (ref 5–15)
BUN: 25 mg/dL — AB (ref 6–20)
BUN: 30 mg/dL — ABNORMAL HIGH (ref 6–20)
CALCIUM: 8.4 mg/dL — AB (ref 8.9–10.3)
CALCIUM: 6.4 mg/dL — AB (ref 8.9–10.3)
CO2: 20 mmol/L — AB (ref 22–32)
CO2: 19 mmol/L — AB (ref 22–32)
CREATININE: 2.54 mg/dL — AB (ref 0.61–1.24)
Chloride: 101 mmol/L (ref 101–111)
Chloride: 109 mmol/L (ref 101–111)
Creatinine, Ser: 2.5 mg/dL — ABNORMAL HIGH (ref 0.61–1.24)
GFR calc non Af Amer: 34 mL/min — ABNORMAL LOW (ref >60)
GFR calc non Af Amer: 35 mL/min — ABNORMAL LOW (ref >60)
GFR, EST AFRICAN AMERICAN: 39 mL/min — AB (ref >60)
GFR, EST AFRICAN AMERICAN: 41 mL/min — AB (ref >60)
GLUCOSE: 100 mg/dL — AB (ref 65–99)
GLUCOSE: 101 mg/dL — AB (ref 65–99)
Potassium: 6.5 mmol/L (ref 3.5–5.1)
SODIUM: 140 mmol/L (ref 135–145)
SODIUM: 140 mmol/L (ref 135–145)
Total Bilirubin: 0.4 mg/dL (ref 0.3–1.2)
Total Bilirubin: 0.3 mg/dL (ref 0.3–1.2)
Total Protein: 8 g/dL (ref 6.5–8.1)
Total Protein: 6.1 g/dL — ABNORMAL LOW (ref 6.5–8.1)

## 2014-11-08 LAB — CORTISOL: CORTISOL PLASMA: 35.9 ug/dL

## 2014-11-08 LAB — URINE MICROSCOPIC-ADD ON

## 2014-11-08 LAB — CK TOTAL AND CKMB (NOT AT ARMC)
CK TOTAL: 32370 U/L — AB (ref 49–397)
CK, MB: >260 ng/mL — ABNORMAL HIGH (ref 0.5–5.0)

## 2014-11-08 LAB — RAPID URINE DRUG SCREEN, HOSP PERFORMED
AMPHETAMINES: NOT DETECTED
BENZODIAZEPINES: NOT DETECTED
Barbiturates: NOT DETECTED
Cocaine: NOT DETECTED
OPIATES: POSITIVE — AB
TETRAHYDROCANNABINOL: NOT DETECTED

## 2014-11-08 LAB — I-STAT CG4 LACTIC ACID, ED
LACTIC ACID, VENOUS: 2.85 mmol/L — AB (ref 0.5–2.0)
Lactic Acid, Venous: 3.18 mmol/L (ref 0.5–2.0)

## 2014-11-08 LAB — TROPONIN I: TROPONIN I: 2.34 ng/mL — AB (ref <0.031)

## 2014-11-08 LAB — CARBOXYHEMOGLOBIN
CARBOXYHEMOGLOBIN: 0.8 % (ref 0.5–1.5)
METHEMOGLOBIN: 1 % (ref 0.0–1.5)
O2 SAT: 74 %
Total hemoglobin: 13.8 g/dL (ref 13.5–18.0)

## 2014-11-08 LAB — CK: Total CK: 21752 U/L — ABNORMAL HIGH (ref 49–397)

## 2014-11-08 LAB — CBC
HCT: 42.8 % (ref 39.0–52.0)
Hemoglobin: 14.4 g/dL (ref 13.0–17.0)
MCH: 30.1 pg (ref 26.0–34.0)
MCHC: 33.6 g/dL (ref 30.0–36.0)
MCV: 89.5 fL (ref 78.0–100.0)
PLATELETS: 239 10*3/uL (ref 150–400)
RBC: 4.78 MIL/uL (ref 4.22–5.81)
RDW: 12.1 % (ref 11.5–15.5)
WBC: 24.8 10*3/uL — ABNORMAL HIGH (ref 4.0–10.5)

## 2014-11-08 LAB — FIBRINOGEN: Fibrinogen: 162 mg/dL — ABNORMAL LOW (ref 204–475)

## 2014-11-08 LAB — ABO/RH: ABO/RH(D): A POS

## 2014-11-08 LAB — I-STAT TROPONIN, ED: Troponin i, poc: 1.37 ng/mL (ref 0.00–0.08)

## 2014-11-08 LAB — APTT: APTT: 28 s (ref 24–37)

## 2014-11-08 LAB — PROTIME-INR
INR: 1.41 (ref 0.00–1.49)
Prothrombin Time: 17.3 seconds — ABNORMAL HIGH (ref 11.6–15.2)

## 2014-11-08 LAB — MRSA PCR SCREENING: MRSA by PCR: NEGATIVE

## 2014-11-08 MED ORDER — DEXTROSE 10 % IV SOLN
Freq: Once | INTRAVENOUS | Status: AC
  Administered 2014-11-08: 250 mL/h via INTRAVENOUS

## 2014-11-08 MED ORDER — ALBUTEROL SULFATE (2.5 MG/3ML) 0.083% IN NEBU
INHALATION_SOLUTION | RESPIRATORY_TRACT | Status: AC
  Filled 2014-11-08: qty 3

## 2014-11-08 MED ORDER — SODIUM CHLORIDE 0.9 % IV BOLUS (SEPSIS)
1000.0000 mL | INTRAVENOUS | Status: AC
  Administered 2014-11-08 (×2): 1000 mL via INTRAVENOUS

## 2014-11-08 MED ORDER — FENTANYL CITRATE (PF) 100 MCG/2ML IJ SOLN
25.0000 ug | INTRAMUSCULAR | Status: DC | PRN
Start: 2014-11-08 — End: 2014-11-09
  Administered 2014-11-08 – 2014-11-09 (×6): 50 ug via INTRAVENOUS
  Filled 2014-11-08 (×6): qty 2

## 2014-11-08 MED ORDER — PIPERACILLIN-TAZOBACTAM 3.375 G IVPB: 3.3750 g | Freq: Three times a day (TID) | INTRAVENOUS | Status: DC

## 2014-11-08 MED ORDER — LEVOFLOXACIN IN D5W 750 MG/150ML IV SOLN
750.0000 mg | INTRAVENOUS | Status: DC
  Administered 2014-11-08 – 2014-11-10 (×2): 750 mg via INTRAVENOUS
  Filled 2014-11-08 (×2): qty 150

## 2014-11-08 MED ORDER — SODIUM BICARBONATE 8.4 % IV SOLN
50.0000 meq | Freq: Once | INTRAVENOUS | Status: AC
  Administered 2014-11-08: 50 meq via INTRAVENOUS
  Filled 2014-11-08: qty 50

## 2014-11-08 MED ORDER — VANCOMYCIN HCL IN DEXTROSE 1-5 GM/200ML-% IV SOLN
1000.0000 mg | INTRAVENOUS | Status: DC
  Administered 2014-11-09 – 2014-11-10 (×2): 1000 mg via INTRAVENOUS
  Filled 2014-11-08 (×2): qty 200

## 2014-11-08 MED ORDER — VANCOMYCIN HCL IN DEXTROSE 1-5 GM/200ML-% IV SOLN
1000.0000 mg | Freq: Once | INTRAVENOUS | Status: AC
  Administered 2014-11-08: 1000 mg via INTRAVENOUS

## 2014-11-08 MED ORDER — NALOXONE HCL 0.4 MG/ML IJ SOLN
0.4000 mg | Freq: Once | INTRAMUSCULAR | Status: AC
  Administered 2014-11-08: 0.4 mg via INTRAVENOUS
  Filled 2014-11-08: qty 1

## 2014-11-08 MED ORDER — SODIUM CHLORIDE 0.9 % IV SOLN: INTRAVENOUS | Status: DC

## 2014-11-08 MED ORDER — NOREPINEPHRINE BITARTRATE 1 MG/ML IV SOLN
2.0000 ug/min | INTRAVENOUS | Status: DC
  Administered 2014-11-08: 2 ug/min via INTRAVENOUS
  Filled 2014-11-08: qty 4

## 2014-11-08 MED ORDER — SODIUM CHLORIDE 0.9 % IV SOLN
INTRAVENOUS | Status: DC
  Administered 2014-11-08: 10:00:00 via INTRAVENOUS

## 2014-11-08 MED ORDER — INSULIN ASPART 100 UNIT/ML IV SOLN
10.0000 [IU] | Freq: Once | INTRAVENOUS | Status: AC
  Administered 2014-11-08: 10 [IU] via INTRAVENOUS
  Filled 2014-11-08: qty 0.1

## 2014-11-08 MED ORDER — NALOXONE HCL 0.4 MG/ML IJ SOLN
0.4000 mg | Freq: Once | INTRAMUSCULAR | Status: AC
  Administered 2014-11-08: 0.4 mg via INTRAVENOUS

## 2014-11-08 MED ORDER — SODIUM CHLORIDE 0.9 % IV BOLUS (SEPSIS)
1000.0000 mL | INTRAVENOUS | Status: AC
Start: 2014-11-08 — End: 2014-11-08
  Administered 2014-11-08 (×2): 1000 mL via INTRAVENOUS

## 2014-11-08 MED ORDER — ONDANSETRON HCL 4 MG/2ML IJ SOLN
4.0000 mg | Freq: Four times a day (QID) | INTRAMUSCULAR | Status: DC | PRN
  Administered 2014-11-08 – 2014-11-09 (×3): 4 mg via INTRAVENOUS
  Filled 2014-11-08 (×3): qty 2

## 2014-11-08 MED ORDER — SODIUM CHLORIDE 0.9 % IV BOLUS (SEPSIS)
2000.0000 mL | Freq: Once | INTRAVENOUS | Status: AC
  Administered 2014-11-08: 2000 mL via INTRAVENOUS

## 2014-11-08 MED ORDER — NALOXONE HCL 0.4 MG/ML IJ SOLN
INTRAMUSCULAR | Status: AC
  Filled 2014-11-08: qty 1

## 2014-11-08 MED ORDER — NALOXONE HCL 1 MG/ML IJ SOLN
1.0000 mg/h | INTRAVENOUS | Status: DC
  Administered 2014-11-08: 0.5 mg/h via INTRAVENOUS
  Administered 2014-11-08: 1 mg/h via INTRAVENOUS
  Filled 2014-11-08 (×2): qty 4

## 2014-11-08 MED ORDER — PIPERACILLIN-TAZOBACTAM 3.375 G IVPB 30 MIN
3.3750 g | Freq: Once | INTRAVENOUS | Status: AC
  Administered 2014-11-08: 3.375 g via INTRAVENOUS

## 2014-11-08 MED ORDER — VASOPRESSIN 20 UNIT/ML IV SOLN
0.0300 [IU]/min | INTRAVENOUS | Status: DC
  Administered 2014-11-08: 0.03 [IU]/min via INTRAVENOUS
  Filled 2014-11-08: qty 2

## 2014-11-08 MED ORDER — LIDOCAINE 5 % EX PTCH
1.0000 | MEDICATED_PATCH | CUTANEOUS | Status: DC
  Administered 2014-11-08 – 2014-11-14 (×6): 1 via TRANSDERMAL
  Filled 2014-11-08 (×9): qty 1

## 2014-11-08 MED ORDER — SODIUM CHLORIDE 0.9 % IV SOLN: 250.0000 mL | INTRAVENOUS | Status: DC | PRN

## 2014-11-08 MED ORDER — AZTREONAM 1 G IJ SOLR
1.0000 g | Freq: Three times a day (TID) | INTRAMUSCULAR | Status: DC
  Administered 2014-11-08 – 2014-11-09 (×3): 1 g via INTRAVENOUS
  Filled 2014-11-08 (×4): qty 1

## 2014-11-08 MED ORDER — SODIUM BICARBONATE 8.4 % IV SOLN
INTRAVENOUS | Status: DC
  Administered 2014-11-08 – 2014-11-09 (×3): via INTRAVENOUS
  Filled 2014-11-08 (×6): qty 150

## 2014-11-08 MED ORDER — SODIUM POLYSTYRENE SULFONATE 15 GM/60ML PO SUSP
30.0000 g | Freq: Once | ORAL | Status: AC
  Administered 2014-11-08: 30 g via ORAL
  Filled 2014-11-08: qty 120

## 2014-11-08 MED ORDER — NOREPINEPHRINE BITARTRATE 1 MG/ML IV SOLN
5.0000 ug/min | INTRAVENOUS | Status: DC
  Administered 2014-11-08: 20 ug/min via INTRAVENOUS
  Administered 2014-11-08: 30 ug/min via INTRAVENOUS
  Filled 2014-11-08 (×2): qty 4

## 2014-11-08 MED ORDER — HEPARIN SODIUM (PORCINE) 5000 UNIT/ML IJ SOLN
5000.0000 [IU] | Freq: Three times a day (TID) | INTRAMUSCULAR | Status: DC
  Administered 2014-11-08 – 2014-11-16 (×24): 5000 [IU] via SUBCUTANEOUS
  Filled 2014-11-08 (×25): qty 1

## 2014-11-08 MED ORDER — SODIUM CHLORIDE 0.9 % IV SOLN
1.0000 g | Freq: Once | INTRAVENOUS | Status: AC
  Administered 2014-11-08: 1 g via INTRAVENOUS
  Filled 2014-11-08: qty 10

## 2014-11-08 MED ORDER — ALBUTEROL SULFATE (2.5 MG/3ML) 0.083% IN NEBU
10.0000 mg | INHALATION_SOLUTION | Freq: Once | RESPIRATORY_TRACT | Status: AC
Start: 2014-11-08 — End: 2014-11-08
  Administered 2014-11-08: 10 mg via RESPIRATORY_TRACT
  Filled 2014-11-08: qty 12

## 2014-11-08 NOTE — ED Provider Notes (Signed)
Medical screening examination/treatment/procedure(s) were conducted as a shared visit with non-physician practitioner(s) and myself.  I personally evaluated the patient during the encounter.   EKG Interpretation   Date/Time:  Friday November 08 2014 06:54:44 EDT Ventricular Rate:  104 PR Interval:  151 QRS Duration: 108 QT Interval:  337 QTC Calculation: 443 R Axis:   147 Text Interpretation:  Sinus tachycardia RSR' in V1 or V2, probably normal  variant ST elevation suggests acute pericarditis no reciprocal changes No  old tracing to compare Confirmed by [place] ([id number]) on 11/08/2014  6:57:23 AM       See the written copy of this report in the patient's paper medical record.  These results did not interface directly into the electronic medical record and are summarized here.  19 yo M with a chief complaint of narcotic overdose. Patient found to be significantly hypotensive as well as an acute renal failure with elevated potassium as it is 31.4 positive troponin.   On exam patient with some small red nontender nodules to the left hand. Patient with one area of possible necrosis to the right third digit. No noted abdominal tenderness. Patient with decreased dorsiflexion to the left foot. Otherwise, pulse motor and sensation intact.  Concern for possible endocarditis with septic emboli. Started on Vanc and Zosyn. Given 3 L fluid without improvement of BP. Narcan drip without relief.  Reassessment multiple times with mild improvement of blood pressure.  ICU admit.   Evaluated by ICU team blood pressure improved with norepinephrine. Patient had a repeat EKG performed with new flipped T waves in leads V3 as well as possible elevation of lead 1 and aVL. Patient with some chest pain that feeling pain all over. Discussed findings with ICU attending will check troponins. CRITICAL CARE Performed by: Cecilio Asper   Total critical care time: 94min   Critical care time was  exclusive of separately billable procedures and treating other patients.  Critical care was necessary to treat or prevent imminent or life-threatening deterioration.  Critical care was time spent personally by me on the following activities: development of treatment plan with patient and/or surrogate as well as nursing, discussions with consultants, evaluation of patient's response to treatment, examination of patient, obtaining history from patient or surrogate, ordering and performing treatments and interventions, ordering and review of laboratory studies, ordering and review of radiographic studies, pulse oximetry and re-evaluation of patient's condition.   Deno Etienne, DO 11/08/14 1432

## 2014-11-08 NOTE — Progress Notes (Signed)
eLink Physician-Brief Progress Note Patient Name: Daaron Dimarco DOB: [birth year] MRN: [id number]   Date of Service  11/08/2014  HPI/Events of Note  Multiple issues: Rising lactic acid again, yet shock resolved, making good urine; no abdominal pain, complains of left leg pain Troponin elevated  eICU Interventions  Repeat 12 lead, lactic acid now Stat CK Stat LE doppler lidoderm patch to leg, holding sedative with elevated LFTs     Intervention Category Intermediate Interventions: Pain - evaluation and management;Diagnostic test evaluation  MCQUAID, DOUGLAS 11/08/2014, 4:32 PM

## 2014-11-08 NOTE — ED Notes (Signed)
Pt. On monitor. 

## 2014-11-08 NOTE — ED Notes (Signed)
Bed: GY56 Expected date: 11/08/14 Expected time: 5:33 AM Means of arrival: Ambulance Comments: Heroin overdose

## 2014-11-08 NOTE — ED Notes (Signed)
Pt c/o actively dry heaving and spitting up clear sputum.

## 2014-11-08 NOTE — Progress Notes (Signed)
ANTIBIOTIC CONSULT NOTE - INITIAL  Pharmacy Consult for vancomycin / aztreonam / levaquin Indication: rule out sepsis  Allergies  Allergen Reactions  . Penicillins Swelling    Lips swell.    . Sulfa Antibiotics Swelling    Patient Measurements: Height: 5\' 6"  (167.6 cm) Weight: 130 lb (58.968 kg) IBW/kg (Calculated) : 63.8 Adjusted Body Weight:   Vital Signs: Temp: 100.6 F (38.1 C) (09/02 1135) Temp Source: Oral (09/02 0552) BP: 124/60 mmHg (09/02 1135) Pulse Rate: 88 (09/02 1135) Intake/Output from previous day:   Intake/Output from this shift: Total I/O In: -  Out: 100 [Urine:100]  Labs:  Recent Labs  11/08/14 0652 11/08/14 0927  WBC 31.4* 24.8*  HGB 16.0 14.4  PLT PLATELET CLUMPS NOTED ON SMEAR, COUNT APPEARS ADEQUATE 239  CREATININE 2.54* 2.50*   Estimated Creatinine Clearance: 39 mL/min (by C-G formula based on Cr of 2.5). No results for input(s): VANCOTROUGH, VANCOPEAK, VANCORANDOM, GENTTROUGH, GENTPEAK, GENTRANDOM, TOBRATROUGH, TOBRAPEAK, TOBRARND, AMIKACINPEAK, AMIKACINTROU, AMIKACIN in the last 72 hours.   Microbiology: No results found for this or any previous visit (from the past 720 hour(s)).  Medical History: Past Medical History  Diagnosis Date  . 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 EDP starting antibiotics for sepsis. Lactic acid elevated in ED.  New allergies to pencillin and sulfa noted with reaction of lip swelling.  Pt has received one dose of zosyn so far, no reaction yet.  RN to monitor closely.  CCM has adjusted antibiotics.     9/2 >> vancomycin  >> 9/2 >> pip/tazo  >>  9/2 9/2 >> aztreonam >> 9/2 >> levaquin >>  9/2 bloodx2: IP 9/2 urine: IP  Renal: AKI noted, SCr at admission = 2.54, CrCl ~39 ml/min (N48) WBC WNL Afebrile  Goal of Therapy:  Vancomycin trough level 15-20 mcg/ml  Plan:   Vancomycin 1gm IV x 1 then 1gm IV q24h  Follow renal function and adjust dose as indicated  Check  trough as indicated  Stop zosyn. Monitor closely for signs / symptoms of allergic reaction.    Aztreonam 1g IV q8h  Levaquin 750mg  IV q48h  Monitor QTc closely

## 2014-11-08 NOTE — H&P (Signed)
PULMONARY / CRITICAL CARE MEDICINE   Name: Erik [name] MRN: [id number] DOB: 02-15-1996    ADMISSION DATE:  11/08/2014 CONSULTATION DATE:  9/2  REFERRING MD :  Zenia Resides   CHIEF COMPLAINT:  Septic shock and overdose   INITIAL PRESENTATION:  19 year old male found down in apartment. Initially sats 84% room air and rr 4, hypotensive. Placed on narcan gtt. Later admitted to IV injection of heroin and NorFentanyl. Admitted w/ working dx of narcotic overdose, septic shock and MODS, r/o endocarditis.   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 Residing at drug rehab house. Found lying in floor by house mate w/ syringes and drugs at side. EMS called. Narcan given. Initially O2 sats 84% on room air w/ depressed resp rate 4/min. After narcan was more awake but remained hypotensive. Initial LA 3.18. Later admitted to injecting Norfentanyl and heroin. Further eval demonstrated multiple organ system failure including: AKI, and liver failure. Resuscitated w/ 30 ml/kg IVFs,  - Repeat Assessment on CCM arrival.  Performed at:    0900  Vitals     BP 79/35 mmHg  Pulse 104  Temp(Src) 97.6 F (36.4 C) (Oral)  Resp 18  Ht 5\' 6"  (1.676 m)  Wt 58.968 kg (130 lb)  BMI 20.99 kg/m2  SpO2 100%  Heart:     Regular rate and rhythm  Lungs:    CTA  Capillary Refill:   <2 sec  Peripheral Pulse:   Dorsalis pedis pulse  palpable  Skin:     Flushed.  He was then placed on levophed gtt, IVF continued, and central line placed. Admitted to the ICU w/ working dx of narcotic IVD overdose and possible endocarditis w/ resultant sepsis.   PAST MEDICAL HISTORY :   has a past medical history of Hep B w/o coma.  has no past surgical history on file. Prior to Admission medications   Not on File   No Known Allergies  FAMILY HISTORY:  has no family status information on file.  SOCIAL HISTORY:  reports that he has been smoking.  He does  not have any smokeless tobacco history on file. He reports that he drinks alcohol. He reports that he uses illicit drugs (IV). REVIEW OF SYSTEMS:   Generalized weakness. Can't feel Left foot.   SUBJECTIVE:  Intermittent nausea  VITAL SIGNS: Temp:  [97.6 F (36.4 C)] 97.6 F (36.4 C) (09/02 0552) Pulse Rate:  [101-113] 105 (09/02 0900) Resp:  [12-32] 18 (09/02 0900) BP: (54-101)/(14-72) 64/14 mmHg (09/02 0900) SpO2:  [88 %-100 %] 100 % (09/02 0900) Weight:  [58.968 kg (130 lb)] 58.968 kg (130 lb) (09/02 0652) HEMODYNAMICS:   VENTILATOR SETTINGS:   INTAKE / OUTPUT: No intake or output data in the 24 hours ending 11/08/14 0934  PHYSICAL EXAMINATION: General:  19 year old male, acutely ill. Awake, not in distress but c/o nausea and inability to feel left leg Neuro:  Awake, oriented, no focal strength def. But c/o inability to feel left leg.  HEENT:  Bruising left lateral face. Otherwise NCAT Cardiovascular:  rrr Lungs:  Clear  Abdomen:  Soft, non-tender + bowel sounds  Musculoskeletal:  Good tone, equal strength Skin:  Warm, flushed, bounding pulses.   LABS:  CBC  Recent Labs Lab 11/08/14 0652  WBC 31.4*  HGB 16.0  HCT 47.0  PLT PLATELET CLUMPS NOTED ON SMEAR, COUNT APPEARS ADEQUATE   Coag's  No results for input(s): APTT, INR in the last 168 hours. BMET  Recent Labs Lab 11/08/14 0652  NA 140  K 6.5*  CL 101  CO2 20*  BUN 25*  CREATININE 2.54*  GLUCOSE 100*   Electrolytes  Recent Labs Lab 11/08/14 0652  CALCIUM 8.4*   Sepsis Markers  Recent Labs Lab 11/08/14 0735 11/08/14 0929  LATICACIDVEN 3.18* 2.85*   ABG No results for input(s): PHART, PCO2ART, PO2ART in the last 168 hours. Liver Enzymes  Recent Labs Lab 11/08/14 0652  AST 226*  ALT 141*  ALKPHOS 145*  BILITOT 0.4  ALBUMIN 4.8   Cardiac Enzymes No results for input(s): TROPONINI, PROBNP in the last 168 hours. Glucose No results for input(s): GLUCAP in the last 168  hours.  Imaging Dg Chest Port 1 View  11/08/2014   CLINICAL DATA:  Found on floor with drugs.  Hypotensive.  Hypoxic.  EXAM: PORTABLE CHEST - 1 VIEW  COMPARISON:  None.  FINDINGS: A single AP portable view of the chest demonstrates no focal airspace consolidation or alveolar edema. The lungs are grossly clear. There is no large effusion or pneumothorax. Cardiac and mediastinal contours appear unremarkable.  IMPRESSION: No active disease.   Electronically Signed   By: Erik [name] M.D.   On: 11/08/2014 06:53     ASSESSMENT / PLAN:  PULMONARY OETT  A:  Acute hypoxic respiratory failure in setting of narcotic induced respiratory failure  P:   Supplemental Oxygen  Narcan gtt  Pulse ox Aspiration precautions  CARDIOVASCULAR CVL left [place] CVL 9/2>>> A:  Septic shock/MODS P:  Volume resuscitation  cvp goal 8-12  MAP goal > 65 See ID section   RENAL A:   AKI Lactic acidosis -->improving Component of NAG acidosis  Hyperkalemia -->treated in ER P:   Bicarb gtt Repeat K on arrival to ICU Repeat serial chemistry   GASTROINTESTINAL A:   Shock liver in setting of MODS and IVDA P:   Ice chips only LFTs  HEMATOLOGIC A:   Leukocytosis  Probable thrombocytopenia  P:  Trend cbc [place] heparin   INFECTIOUS A:   Septic shock. Presume that this is bacteremia and presumptive endocarditis  P:   BCx2  9/2>>> UC 9/2>>> vanc 9/2>>> Zosyn 9/2>>>  ENDOCRINE A:   No acute  P:   Trend glucose   NEUROLOGIC A:  Acute encephalopathy in setting of narcotic OD.Marland Kitchen Better on narcan infusion  P:   RASS goal: 0 Titrate narcan to effect    FAMILY  - Updates:   - Inter-disciplinary family meet or Palliative Care meeting due by: 9/9    TODAY'S SUMMARY:  Narcotic overdose and septic shock, likely endocarditis w/ MODS. Has responded to EGDT w/ improved MS, decreased tachycardia, and clearing lactic acid. ECHO ordered. On levo/vasopressin gtts. Broad spec abx administered.  Acidosis and hyperkalemia likely to be biggest issue this afternoon.    [name] ACNP-BC [place] Pager # [phone] OR # [phone] if no answer   11/08/2014, 9:34 AM

## 2014-11-08 NOTE — Progress Notes (Signed)
eLink Physician-Brief Progress Note Patient Name: Erik [name] DOB: Sep 12, 1995 MRN: [id number]   Date of Service  11/08/2014  HPI/Events of Note  MRI results show edema in leg muscle. No description of gas or necrosis  Foley out due to MRI   Recent Labs Lab 11/08/14 0652 11/08/14 0927 11/08/14 1440 11/08/14 2120  CREATININE 2.54* 2.50* 2.84* 2.94*      eICU Interventions  2L saline bolus Continue bic at 150cc/h Repeat CK in AM RN to inform family about MRI result Replace foley     Intervention Category Intermediate Interventions: Diagnostic test evaluation  Timm Bonenberger 11/08/2014, 10:38 PM

## 2014-11-08 NOTE — Progress Notes (Signed)
VASCULAR LAB PRELIMINARY  PRELIMINARY  PRELIMINARY  PRELIMINARY  Left lower extremity venous duplex completed.    Preliminary report:  Left:  No evidence of DVT, superficial thrombosis, or Baker's cyst.  Jalyssa Fleisher, RVT 11/08/2014, 6:33 PM

## 2014-11-08 NOTE — Progress Notes (Signed)
eLink Physician-Brief Progress Note Patient Name: Erik [name] DOB: [birth year] MRN: [id number]   Date of Service  11/08/2014  HPI/Events of Note  Persistent leg pain LE doppler Discussed with ortho > ddx includes abscess or muscle infarct, compartment syndrome less likely without swelling  eICU Interventions  CT leg now, without contrast     Intervention Category Minor Interventions: Routine modifications to care plan (e.g. PRN medications for pain, fever)  Lasya Vetter 11/08/2014, 6:23 PM

## 2014-11-08 NOTE — ED Provider Notes (Signed)
CSN: [id number]     Arrival date & time 11/08/14  0549 History   First MD Initiated Contact with Patient 11/08/14 [id number]     Chief Complaint  Patient presents with  . Drug Overdose     (Consider location/radiation/quality/duration/timing/severity/associated sxs/prior Treatment) Patient is a 19 y.o. male presenting with Overdose. The history is provided by the patient, the EMS personnel and medical records. No language interpreter was used.  Drug Overdose     Erik [name] is a 19 y.o. male  with a hx of Hep B, heroin abuse presents to the Emergency Department after being found down at a drug rehab house.  Pt reports he did 0.1g of heroin in 1 hit sometime prior to arrival.  He is unable to tell me when he used.  He reports total body pain, chest pain, SOB.  Pt with hypoxia on arrival to 84%, tachycardia and hypotension.  Pt is in and out of consciousness with intermittent apnea.  Pt also with bruising to the left cheek.  No aggravating or alleviating factors.  Pt denies fever, chills, nausea vomiting.    LEVEL 5 CAVEAT FOR AMS   Past Medical History  Diagnosis Date  . 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 No pertinent past surgical history. No family history on file. Social History  Substance Use Topics  . Smoking status: Current Some Day Smoker  . Smokeless tobacco: None  . Alcohol Use: Yes    Review of Systems  Unable to perform ROS: Mental status change      Allergies  Review of patient's allergies indicates no known allergies.  Home Medications   Prior to Admission medications   Not on File   BP 64/41 mmHg  Pulse 108  Temp(Src) 97.6 F (36.4 C) (Oral)  Resp 17  Ht 5\' 6"  (1.676 m)  Wt 130 lb (58.968 kg)  BMI 20.99 kg/m2  SpO2 100% Physical Exam  Constitutional: He appears well-developed and well-nourished. He appears distressed.  Pt is in and out of consciousness Sleepy, arousable with pain or loud voice  HENT:  Head: Normocephalic.  Mouth/Throat: No  oropharyngeal exudate.  Contusion to the left cheek  Eyes: Conjunctivae are normal. No scleral icterus.  Neck: Normal range of motion. Neck supple.  Cardiovascular: Regular rhythm.  Tachycardia present.   No murmur heard. Pulses:      Radial pulses are 1+ on the right side, and 1+ on the left side.       Dorsalis pedis pulses are 0 on the right side, and 0 on the left side.  Thready radial pulse No audible murmur  Pulmonary/Chest: Effort normal and breath sounds normal. No respiratory distress. He has no wheezes.  Equal chest expansion Diminished breath sounds Intermittent periods of apnea when not being stimulated  Abdominal: Soft. Bowel sounds are normal. He exhibits no distension and no mass. There is no tenderness. There is no rebound and no guarding.  Soft and nontender  Musculoskeletal: Normal range of motion. He exhibits no edema.  No noted joint swelling  Neurological:  Lethargic, arousable to loud noise and painful stimuli with intermittent periods of unconsciousness Moves extremities without ataxia  Skin: Skin is dry. He is not diaphoretic.  Psychiatric: He is not actively hallucinating. He expresses no homicidal and no suicidal ideation. He expresses no suicidal plans and no homicidal plans.  Nursing note and vitals reviewed.   ED Course  Procedures (including critical care time) Labs Review Labs Reviewed  COMPREHENSIVE METABOLIC PANEL -  Abnormal; Notable for the following:    Potassium 6.5 (*)    CO2 20 (*)    Glucose, Bld 100 (*)    BUN 25 (*)    Creatinine, Ser 2.54 (*)    Calcium 8.4 (*)    AST 226 (*)    ALT 141 (*)    Alkaline Phosphatase 145 (*)    GFR calc non Af Amer 34 (*)    GFR calc Af Amer 39 (*)    Anion gap 19 (*)    All other components within normal limits  CBC WITH DIFFERENTIAL/PLATELET - Abnormal; Notable for the following:    WBC 31.4 (*)    Neutrophils Relative % 85 (*)    Lymphocytes Relative 6 (*)    Neutro Abs 26.7 (*)    Monocytes  Absolute 2.8 (*)    All other components within normal limits  I-STAT CG4 LACTIC ACID, ED - Abnormal; Notable for the following:    Lactic Acid, Venous 3.18 (*)    All other components within normal limits  I-STAT TROPOININ, ED - Abnormal; Notable for the following:    Troponin i, poc 1.37 (*)    All other components within normal limits  CULTURE, BLOOD (ROUTINE X 2)  CULTURE, BLOOD (ROUTINE X 2)  URINE CULTURE  CULTURE, BLOOD (ROUTINE X 2)  CULTURE, BLOOD (ROUTINE X 2)  URINE CULTURE  URINALYSIS, ROUTINE W REFLEX MICROSCOPIC (NOT AT [facility])  URINE RAPID DRUG SCREEN, HOSP PERFORMED  HEPATITIS PANEL, ACUTE  CBC  COMPREHENSIVE METABOLIC PANEL  LACTIC ACID, PLASMA  LACTIC ACID, PLASMA  URINALYSIS, ROUTINE W REFLEX MICROSCOPIC (NOT AT [facility])  CORTISOL  TROPONIN I  PROTIME-INR  APTT  FIBRINOGEN  I-STAT CG4 LACTIC ACID, ED  TYPE AND SCREEN    Imaging Review Dg Chest Port 1 View  11/08/2014   CLINICAL DATA:  Found on floor with drugs.  Hypotensive.  Hypoxic.  EXAM: PORTABLE CHEST - 1 VIEW  COMPARISON:  None.  FINDINGS: A single AP portable view of the chest demonstrates no focal airspace consolidation or alveolar edema. The lungs are grossly clear. There is no large effusion or pneumothorax. Cardiac and mediastinal contours appear unremarkable.  IMPRESSION: No active disease.   Electronically Signed   By: Andreas Newport M.D.   On: 11/08/2014 06:53   I have personally reviewed and evaluated these images and lab results as part of my medical decision-making.   EKG Interpretation   Date/Time:  Friday November 08 2014 06:54:44 EDT Ventricular Rate:  104 PR Interval:  151 QRS Duration: 108 QT Interval:  337 QTC Calculation: 443 R Axis:   147 Text Interpretation:  Sinus tachycardia RSR' in V1 or V2, probably normal  variant ST elevation suggests acute pericarditis no reciprocal changes No  old tracing to compare Confirmed by GOLDSTON  MD, SCOTT ([id number]) on 11/08/2014  6:57:23 AM        CRITICAL CARE Performed by: Abigail Butts Total critical care time: 65min Critical care time was exclusive of separately billable procedures and treating other patients. Critical care was necessary to treat or prevent imminent or life-threatening deterioration. Critical care was time spent personally by me on the following activities: development of treatment plan with patient and/or surrogate as well as nursing, discussions with consultants, evaluation of patient's response to treatment, examination of patient, obtaining history from patient or surrogate, ordering and performing treatments and interventions, ordering and review of laboratory studies, ordering and review of radiographic studies, pulse oximetry and re-evaluation of  patient's condition.   MDM   Final diagnoses:  Septic shock  IVDU (intravenous drug user)  Chest pain, unspecified chest pain type  Elevated troponin   Altamese Crumpler presents with heroin overdose.  Tachycardia, hypoxia, hypotension. Patient with chest pain and shortness of breath. Workup pending.  Concern for sepsis and endocarditis versus PE versus acute heroin overdose. We'll assess for pulmonary edema though no rales noted on exam.   7:50 AM  Patient with elevated troponin and lactic acid. Antibiotics ordered.  Vanc and Zosyn for likely septic endocarditis.  Cardiology consulted.    8:20AM Patient with acute renal failure, elevated potassium and white blood cell count of 31.4.   8:22 AM Pt with persistent hypotension.  Pt admits to using heroin mixed with carfentanyl.   Will begin naloxone and levophed drips.  Critical care consulted.  Pt now complaining of left lower leg numbness with subjective decreased sensation from the knee to the toe.  Weakness with dorsiflexion.  Question possible epidural or spinal abscess.    9:08 AM Discussed with critical care and cardiology. PCCM to admit and Cards to consult.    BP 64/41 mmHg  Pulse 108   Temp(Src) 97.6 F (36.4 C) (Oral)  Resp 17  Ht 5\' 6"  (1.676 m)  Wt 130 lb (58.968 kg)  BMI 20.99 kg/m2  SpO2 100%  The patient was discussed with and seen by Dr. Regenia Skeeter and Dr. Tyrone Nine who agrees with the treatment plan.  10:53 AM Repeat lab results with potassium now greater than 7.5. Troponin is elevated to 2.3 for now. Will begin hyperkalemia protocol including albuterol, insulin, dextrose, sodium bicarbonate, calcium.    Jarrett Soho Ruble Buttler, PA-C 11/08/14 [place], DO 11/08/14 1432

## 2014-11-08 NOTE — ED Notes (Signed)
EKG given to EDP,Goldston,.MD., for review. 

## 2014-11-08 NOTE — ED Notes (Signed)
Abnormal labs reported to Kennedy MD and Jarrett Soho PA.   -Potassium> 7.5 -Calcium 6.4 -Troponin 2.34

## 2014-11-08 NOTE — Consult Note (Signed)
CARDIOLOGY CONSULT NOTE   Patient ID: Eytan Carrigan MRN: [id number], DOB/AGE: 10-11-1995   Admit date: 11/08/2014 Date of Consult: 11/08/2014   Primary Physician: Wendie Agreste, MD Primary Cardiologist: new   Pt. Profile  19 year old male with history of hepatitis, heroin abuse who presented to [facility] after found down at a drug rehabilitation house. Admitted for drug overdose +/- septic SBE. Found to have hypotension, AKI, tachycardia. Cardiology consulted for trop 1.37  Problem List  Past Medical History  Diagnosis Date  . 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 No past surgeries       Allergies  No Known Allergies  HPI   Patient is a 19 year old male with history of hepatitis, heroin abuse who presented to [facility] after found down at a drug rehabilitation house. Patient is currently conscious during interview. He reports he did heroine prior to arrival. He has total body pain and chest pain with shortness of breath. He was hypoxic on arrival. Currently his blood pressure is in the 70s and a tachycardic with heart rate of 110. He does not have family history since he was adopted. He is currently being admitted by pulmonary critical care service. Significant laboratory finding on arrival include potassium 6.5, creatinine 2.54, AST 226, ALT 141, anion gap 19, white blood cell count 31.4. Lactic acid 3.18. First troponin 1.37. Blood culture and urine culture currently pending. Initial EKG was interpreted as sinus tachycardia with diffuse ST elevation consistent with pericarditis, the ST elevation and PR depression is not very obvious on my review. 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   Inpatient Medications  . albuterol      . heparin  5,000 Units Subcutaneous 3 times per day  . insulin aspart  10 Units Intravenous Once  . sodium bicarbonate   50 mEq Intravenous Once  . sodium polystyrene  30 g Oral Once    Family History Family History  Problem Relation Age of Onset  . Adopted: Yes     Social History Social History   Social History  . Marital Status: Single    Spouse Name: N/A  . Number of Children: N/A  . Years of Education: N/A   Occupational History  . Not on file.   Social History Main Topics  . Smoking status: Current Some Day Smoker  . Smokeless tobacco: Not on file  . Alcohol Use: Yes  . Drug Use: Yes    Special: IV     Comment: [place]  . Sexual Activity: Not on file   Other Topics Concern  . Not on file   Social History Narrative  . No narrative on file     Review of Systems  General:  No chills, fever Cardiovascular:  No edema, orthopnea, palpitations, paroxysmal nocturnal dyspnea. +pain everywhere, but also has cp Respiratory: No cough +dyspnea Urologic: No hematuria, dysuria Abdominal:   No nausea, vomiting, diarrhea, bright red blood per rectum, melena, or hematemesis Neurologic:  No visual changes +weakness All other systems reviewed and are otherwise negative except as noted above.  Physical Exam  Blood pressure 109/45, pulse 104, temperature 100.2 F (37.9 C), temperature source Oral, resp. rate 27, height 5\' 6"  (1.676 m), weight 130 lb (58.968 kg), SpO2 99 %.  General: NAD, but fairly unstable with hypotension Psych: unable to assess, patient only opens eye to question, getting resuscitation  Neuro: Alert and oriented X 3. Moves all extremities spontaneously. HEENT: Normal  Neck: Supple without bruits or JVD. Lungs:   On [place]. Upper resp rhonchi on anterior exam Heart: tachycardic. no s3, s4, or murmurs. Abdomen: Soft, non-tender, non-distended, BS + x 4.  Extremities: No clubbing, cyanosis or edema. DP/PT/Radials 2+ and equal bilaterally. Extremities warm to touch  Labs   Recent Labs  11/08/14 0927  TROPONINI 2.34*   Lab Results  Component Value Date   WBC 24.8*  11/08/2014   HGB 14.4 11/08/2014   HCT 42.8 11/08/2014   MCV 89.5 11/08/2014   PLT 239 11/08/2014     Recent Labs Lab 11/08/14 0927  NA 140  K >7.5*  CL 109  CO2 19*  BUN 30*  CREATININE 2.50*  CALCIUM 6.4*  PROT 6.1*  BILITOT 0.3  ALKPHOS 70  ALT 121*  AST 234*  GLUCOSE 101*   No results found for: CHOL, HDL, LDLCALC, TRIG No results found for: DDIMER  Radiology/Studies  Dg Chest Port 1 View  11/08/2014   CLINICAL DATA:  Central catheter placement  EXAM: PORTABLE CHEST - 1 VIEW  COMPARISON:  Study obtained earlier in the day  FINDINGS: Central catheter tip is in the superior vena cava just above the cavoatrial junction. No pneumothorax. Lungs clear. Heart is upper normal in size with pulmonary vascularity within normal limits. No adenopathy. No bone lesions.  IMPRESSION: Central catheter tip in superior vena cava just above cavoatrial junction. No pneumothorax. Lungs clear.   Electronically Signed   By: Lowella Grip III M.D.   On: 11/08/2014 09:41   Dg Chest Port 1 View  11/08/2014   CLINICAL DATA:  Found on floor with drugs.  Hypotensive.  Hypoxic.  EXAM: PORTABLE CHEST - 1 VIEW  COMPARISON:  None.  FINDINGS: A single AP portable view of the chest demonstrates no focal airspace consolidation or alveolar edema. The lungs are grossly clear. There is no large effusion or pneumothorax. Cardiac and mediastinal contours appear unremarkable.  IMPRESSION: No active disease.   Electronically Signed   By: Andreas Newport M.D.   On: 11/08/2014 06:53    ECG  Sinus tachycardia with no obvious ST elevation or PR depression was suggested by machine EKG reading  ASSESSMENT AND PLAN  1. Elevated trop in the setting of IV drug abuse, likely sepsis +/- SBE, hypotension, tachycardia  - trend trop x3 for now. EKG from ED interpreted as acute pericarditis, i did not appreciated significant ST elevation or PR depression, tachycardia make interpretation difficult  2. Likely endocarditis:  surprisingly first temp 97.6. He is tachycardic with WBC 31.4. Placing on IV abx.   - low threshold for TEE, however will obtain a TTE for now. He is too unstable for TEE anyway at this time. Resuscitation effort by Pulm critical care  - lactic acid 3.1  3. Heroin overdose: per critical care  4. AKI: receiving IVF.   Signed, Almyra Deforest PA-C 11/08/2014, 11:06 AM  Patient is seen in the ER. He is awake, states "Just let me die". Denies chest pain.  Heart reveals a quiet precordium without murmur gallop rub or click.  His electrocardiogram of 06 54 today shows sinus tachycardia and benign early repolarization but no acute changes of cardiac injury or pericarditis or ischemia.  Subsequent EKG done at 10:11 AM shows normal sinus rhythm and right axis deviation with incomplete right bundle branch block and no ischemic changes.  Initial chemistries show changes of acute renal injury  as well as abnormal liver function studies.  There is a marked elevation of white count suggestive of sepsis.  His elevated troponin level is consistent with his heroin overdose and his hypotension and sepsis.  Doubt primary cardiac event.  Once he is stabilized, he will benefit from a transthoracic echocardiogram.  A  TEE can be done at a later date if necessary.

## 2014-11-08 NOTE — Progress Notes (Signed)
[facility] Progress Note Patient Name: Erik [name] DOB: Jul 20, 1995 MRN: [id number]   Date of Service  11/08/2014  HPI/Events of Note  Case discussed with radiology, they recommend MRI rather than non-con CT leg  eICU Interventions  MRI ordered, without contrast        [name] 11/08/2014, 6:46 PM

## 2014-11-08 NOTE — Progress Notes (Signed)
ANTIBIOTIC CONSULT NOTE - INITIAL  Pharmacy Consult for vancomycin/zosyn Indication: rule out sepsis  No Known Allergies  Patient Measurements: Height: 5\' 6"  (167.6 cm) Weight: 130 lb (58.968 kg) IBW/kg (Calculated) : 63.8 Adjusted Body Weight:   Vital Signs: Temp: 97.6 F (36.4 C) (09/02 0552) Temp Source: Oral (09/02 0552) BP: 72/53 mmHg (09/02 0745) Pulse Rate: 106 (09/02 0745) Intake/Output from previous day:   Intake/Output from this shift:    Labs:  Recent Labs  11/08/14 0652  WBC 31.4*  HGB 16.0  PLT PLATELET CLUMPS NOTED ON SMEAR, COUNT APPEARS ADEQUATE   CrCl cannot be calculated (Patient has no serum creatinine result on file.). No results for input(s): VANCOTROUGH, VANCOPEAK, VANCORANDOM, GENTTROUGH, GENTPEAK, GENTRANDOM, TOBRATROUGH, TOBRAPEAK, TOBRARND, AMIKACINPEAK, AMIKACINTROU, AMIKACIN in the last 72 hours.   Microbiology: No results found for this or any previous visit (from the past 720 hour(s)).  Medical History: Past Medical History  Diagnosis Date  . 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 EDP starting antibiotics for sepsis. Lactic acid elevated in ED.  9/2 >> vancomycin  >> 9/2 >> pip/tazo  >>    9/2 blood:  9/2 urine:   Renal: AKI noted, SCr at admission = 2.54  WBC WNL afebrile  Dose changes/levels:  Goal of Therapy:  Vancomycin trough level 15-20 mcg/ml  Plan:   Vancomycin 1gm IV x 1 then 1gm IV q24h  Follow renal function and adjust dose as indicated  Check trough as indicated  Zosyn 3.375gm IV q8h over 4h infusion.  De-escalate antibiotics as appropriate  Doreene Eland, PharmD, BCPS.   Pager: [phone] 11/08/2014,8:16 AM

## 2014-11-08 NOTE — ED Notes (Signed)
No other lab draw, pt enroute to floor

## 2014-11-08 NOTE — Progress Notes (Signed)
eLink Physician-Brief Progress Note Patient Name: Erik [name] DOB: Mar 14, 1995 MRN: [id number]   Date of Service  11/08/2014  HPI/Events of Note  Lactic acid elevated again Still complaining of left leg pain, no abdominal pain Per RN> sensation intact in legs, pulses intact, calf muscle slightly firm per RN, not hard  Picture worrisome for muscle infarct (rhabdo? Embolic phenomena?) vs compartment syndrome (though currently does not have findings consistent with that)  eICU Interventions  Continue IVF Repeat lactic acid later today  Plan orthopedics consult, may need CT vs MRI of leg  F/U LE doppler     Intervention Category Intermediate Interventions: Diagnostic test evaluation  Abbigayle Toole 11/08/2014, 6:06 PM

## 2014-11-08 NOTE — Progress Notes (Signed)
  Echocardiogram 2D Echocardiogram has been performed.  Dion Parrow 11/08/2014, 1:09 PM

## 2014-11-08 NOTE — Procedures (Signed)
Central Venous Catheter Insertion Procedure Note Myson Levi [id number] May 25, 1995  Procedure: Insertion of Central Venous Catheter Indications: Assessment of intravascular volume, Drug and/or fluid administration and Frequent blood sampling  Procedure Details Consent: Risks of procedure as well as the alternatives and risks of each were explained to the (patient/caregiver).  Consent for procedure obtained. Time Out: Verified patient identification, verified procedure, site/side was marked, verified correct patient position, special equipment/implants available, medications/allergies/relevent history reviewed, required imaging and test results available.  Performed  Maximum sterile technique was used including antiseptics, cap, gloves, gown, hand hygiene, mask and sheet. Skin prep: Chlorhexidine; local anesthetic administered A antimicrobial bonded/coated triple lumen catheter was placed in the left subclavian vein using the Seldinger technique.  Evaluation Blood flow good Complications: No apparent complications Patient did tolerate procedure well. Chest X-ray ordered to verify placement.  CXR: normal.  Robbyn Hodkinson,PETE 11/08/2014, 11:44 AM

## 2014-11-08 NOTE — ED Notes (Signed)
Pt arrives to the ER via EMS after pt was found laying in floor by members of the drug rehab house he was saying in; pt was found with syringes and drugs at his side; pt is suspected to have fallen after ingestion of heroin; pt with bruising to left cheek; pt c/o generalized pain; pt states that he is having a hard time breathing; pt has periods of dec LOC and needs to be aroused; during these periods of dec LOC pt resp rate drops to 4 / min; pt O2 sat initially 88% on room air upon arrival and drops to 84%; pt placed on 4lpm via [place]; pt hypotensive and tachycardic and cool to the touch to extremities

## 2014-11-09 DIAGNOSIS — R7989 Other specified abnormal findings of blood chemistry: Secondary | ICD-10-CM

## 2014-11-09 DIAGNOSIS — I519 Heart disease, unspecified: Secondary | ICD-10-CM

## 2014-11-09 DIAGNOSIS — Z452 Encounter for adjustment and management of vascular access device: Secondary | ICD-10-CM

## 2014-11-09 DIAGNOSIS — R778 Other specified abnormalities of plasma proteins: Secondary | ICD-10-CM | POA: Insufficient documentation

## 2014-11-09 DIAGNOSIS — R079 Chest pain, unspecified: Secondary | ICD-10-CM

## 2014-11-09 DIAGNOSIS — J9601 Acute respiratory failure with hypoxia: Secondary | ICD-10-CM | POA: Insufficient documentation

## 2014-11-09 DIAGNOSIS — F1990 Other psychoactive substance use, unspecified, uncomplicated: Secondary | ICD-10-CM

## 2014-11-09 LAB — CK TOTAL AND CKMB (NOT AT ARMC)
CK TOTAL: 36294 U/L — AB (ref 49–397)
CK, MB: >260 ng/mL — ABNORMAL HIGH (ref 0.5–5.0)
Total CK: 34238 U/L — ABNORMAL HIGH (ref 49–397)

## 2014-11-09 LAB — BASIC METABOLIC PANEL
ANION GAP: 8 (ref 5–15)
Anion gap: 9 (ref 5–15)
Anion gap: 6 (ref 5–15)
BUN: 35 mg/dL — AB (ref 6–20)
BUN: 37 mg/dL — ABNORMAL HIGH (ref 6–20)
BUN: 36 mg/dL — ABNORMAL HIGH (ref 6–20)
CHLORIDE: 99 mmol/L — AB (ref 101–111)
CHLORIDE: 100 mmol/L — AB (ref 101–111)
CHLORIDE: 100 mmol/L — AB (ref 101–111)
CO2: 26 mmol/L (ref 22–32)
CO2: 28 mmol/L (ref 22–32)
CO2: 29 mmol/L (ref 22–32)
Calcium: 6.3 mg/dL — CL (ref 8.9–10.3)
Calcium: 7.1 mg/dL — ABNORMAL LOW (ref 8.9–10.3)
Calcium: 7.7 mg/dL — ABNORMAL LOW (ref 8.9–10.3)
Creatinine, Ser: 2.99 mg/dL — ABNORMAL HIGH (ref 0.61–1.24)
Creatinine, Ser: 3.47 mg/dL — ABNORMAL HIGH (ref 0.61–1.24)
Creatinine, Ser: 3.59 mg/dL — ABNORMAL HIGH (ref 0.61–1.24)
GFR calc Af Amer: 33 mL/min — ABNORMAL LOW (ref >60)
GFR calc non Af Amer: 28 mL/min — ABNORMAL LOW (ref >60)
GFR, EST AFRICAN AMERICAN: 27 mL/min — AB (ref >60)
GFR, EST AFRICAN AMERICAN: 26 mL/min — AB (ref >60)
GFR, EST NON AFRICAN AMERICAN: 24 mL/min — AB (ref >60)
GFR, EST NON AFRICAN AMERICAN: 23 mL/min — AB (ref >60)
GLUCOSE: 131 mg/dL — AB (ref 65–99)
Glucose, Bld: 135 mg/dL — ABNORMAL HIGH (ref 65–99)
Glucose, Bld: 122 mg/dL — ABNORMAL HIGH (ref 65–99)
POTASSIUM: 3.3 mmol/L — AB (ref 3.5–5.1)
POTASSIUM: 3.3 mmol/L — AB (ref 3.5–5.1)
POTASSIUM: 3.5 mmol/L (ref 3.5–5.1)
SODIUM: 136 mmol/L (ref 135–145)
SODIUM: 135 mmol/L (ref 135–145)
Sodium: 134 mmol/L — ABNORMAL LOW (ref 135–145)

## 2014-11-09 LAB — CBC WITH DIFFERENTIAL/PLATELET
BASOS PCT: 0 % (ref 0–1)
Basophils Absolute: 0 10*3/uL (ref 0.0–0.1)
EOS ABS: 0 10*3/uL (ref 0.0–0.7)
EOS PCT: 0 % (ref 0–5)
HCT: 36.4 % — ABNORMAL LOW (ref 39.0–52.0)
HEMOGLOBIN: 12.8 g/dL — AB (ref 13.0–17.0)
Lymphocytes Relative: 15 % (ref 12–46)
Lymphs Abs: 2.1 10*3/uL (ref 0.7–4.0)
MCH: 30.3 pg (ref 26.0–34.0)
MCHC: 35.2 g/dL (ref 30.0–36.0)
MCV: 86.1 fL (ref 78.0–100.0)
MONOS PCT: 8 % (ref 3–12)
Monocytes Absolute: 1.2 10*3/uL — ABNORMAL HIGH (ref 0.1–1.0)
NEUTROS PCT: 77 % (ref 43–77)
Neutro Abs: 11 10*3/uL — ABNORMAL HIGH (ref 1.7–7.7)
PLATELETS: 131 10*3/uL — AB (ref 150–400)
RBC: 4.23 MIL/uL (ref 4.22–5.81)
RDW: 12.1 % (ref 11.5–15.5)
WBC: 14.3 10*3/uL — AB (ref 4.0–10.5)

## 2014-11-09 LAB — BLOOD GAS, ARTERIAL
ACID-BASE EXCESS: 1.8 mmol/L (ref 0.0–2.0)
BICARBONATE: 26.2 meq/L — AB (ref 20.0–24.0)
Drawn by: 235321
O2 CONTENT: 2 L/min
O2 Saturation: 93.3 %
PATIENT TEMPERATURE: 98.8
PCO2 ART: 42.7 mmHg (ref 35.0–45.0)
TCO2: 23.4 mmol/L (ref 0–100)
pH, Arterial: 7.405 (ref 7.350–7.450)
pO2, Arterial: 71.2 mmHg — ABNORMAL LOW (ref 80.0–100.0)

## 2014-11-09 LAB — URINE CULTURE: CULTURE: NO GROWTH

## 2014-11-09 LAB — CBC
HCT: 36.5 % — ABNORMAL LOW (ref 39.0–52.0)
HEMOGLOBIN: 12.5 g/dL — AB (ref 13.0–17.0)
MCH: 29.4 pg (ref 26.0–34.0)
MCHC: 34.2 g/dL (ref 30.0–36.0)
MCV: 85.9 fL (ref 78.0–100.0)
Platelets: 142 10*3/uL — ABNORMAL LOW (ref 150–400)
RBC: 4.25 MIL/uL (ref 4.22–5.81)
RDW: 12 % (ref 11.5–15.5)
WBC: 15.9 10*3/uL — ABNORMAL HIGH (ref 4.0–10.5)

## 2014-11-09 LAB — CK: Total CK: 36795 U/L — ABNORMAL HIGH (ref 49–397)

## 2014-11-09 LAB — MAGNESIUM: Magnesium: 1.1 mg/dL — ABNORMAL LOW (ref 1.7–2.4)

## 2014-11-09 LAB — PHOSPHORUS: PHOSPHORUS: 3.6 mg/dL (ref 2.5–4.6)

## 2014-11-09 MED ORDER — SODIUM CHLORIDE 0.9 % IV SOLN
250.0000 mL | INTRAVENOUS | Status: DC | PRN
  Administered 2014-11-10: 250 mL via INTRAVENOUS

## 2014-11-09 MED ORDER — NICOTINE 7 MG/24HR TD PT24
7.0000 mg | MEDICATED_PATCH | Freq: Every day | TRANSDERMAL | Status: DC
  Administered 2014-11-09 – 2014-11-16 (×8): 7 mg via TRANSDERMAL
  Filled 2014-11-09 (×8): qty 1

## 2014-11-09 MED ORDER — MAGNESIUM SULFATE 4 GM/100ML IV SOLN
4.0000 g | Freq: Once | INTRAVENOUS | Status: AC
  Administered 2014-11-09: 4 g via INTRAVENOUS
  Filled 2014-11-09: qty 100

## 2014-11-09 MED ORDER — POTASSIUM CHLORIDE CRYS ER 20 MEQ PO TBCR
40.0000 meq | EXTENDED_RELEASE_TABLET | Freq: Once | ORAL | Status: AC
  Administered 2014-11-09: 40 meq via ORAL
  Filled 2014-11-09: qty 2

## 2014-11-09 MED ORDER — FENTANYL CITRATE (PF) 100 MCG/2ML IJ SOLN
50.0000 ug | INTRAMUSCULAR | Status: DC | PRN
  Administered 2014-11-09 (×2): 100 ug via INTRAVENOUS
  Administered 2014-11-09 (×5): 75 ug via INTRAVENOUS
  Administered 2014-11-10 – 2014-11-12 (×25): 100 ug via INTRAVENOUS
  Filled 2014-11-09 (×34): qty 2

## 2014-11-09 MED ORDER — MAGNESIUM SULFATE 2 GM/50ML IV SOLN
2.0000 g | Freq: Once | INTRAVENOUS | Status: AC
  Administered 2014-11-09: 2 g via INTRAVENOUS
  Filled 2014-11-09: qty 50

## 2014-11-09 NOTE — Progress Notes (Signed)
[facility] Progress Note Patient Name: Erik [name] DOB: 11/21/1995 MRN: [id number]  [facility] Physician Progress Note and Electrolyte Replacement  Patient Name: Erik [name] DOB: 02-18-1996 MRN: [id number]  Date of Service  11/09/2014   HPI/Events of Note    Recent Labs Lab 11/08/14 0652 11/08/14 0927 11/08/14 1440 11/08/14 2120 11/09/14 0450  NA 140 140 137 137 134*  K 6.5* >7.5* 4.2 3.3* 3.3*  CL 101 109 108 101 99*  CO2 20* 19* 19* 26 26  GLUCOSE 100* 101* 296* 104* 131*  BUN 25* 30* 35* 37* 35*  CREATININE 2.54* 2.50* 2.84* 2.94* 2.99*  CALCIUM 8.4* 6.4* 6.7* 6.9* 6.3*  MG  --   --   --   --  1.1*  PHOS  --   --   --   --  3.6    Estimated Creatinine Clearance: 35.3 mL/min (by C-G formula based on Cr of 2.99).  Intake/Output      09/02 0701 - 09/03 0700   P.O. 300   I.V. (mL/kg) 4141.7 (58)   IV Piggyback 2500   Total Intake(mL/kg) 6941.7 (97.2)   Urine (mL/kg/hr) 1700 (1)   Stool 1 (0)   Total Output 1701   Net +5240.7       Stool Occurrence 2 x    - I/O DETAILED x 24h    Total I/O In: 4100 [I.V.:1800; IV Piggyback:2300] Out: 1115 [Urine:1115] - I/O THIS SHIFT    ASSESSMENT Worsening ATN Rhabdo  Low Mag  Low K  eICU Interventions  2gm mag sulfate    ASSESSMENT: MAJOR ELECTROLYTE      Dr. Brand Males, M.D., [facility].C.P Pulmonary and Critical Care Medicine Staff Physician [facility] Pager: [phone], If no answer or between  15:00h - 7:00h: call [phone]  11/09/2014 7:00 AM       Intervention Category Major Interventions: Electrolyte abnormality - evaluation and management  Sena Hoopingarner 11/09/2014, 7:00 AM

## 2014-11-09 NOTE — Progress Notes (Signed)
eLink Physician-Brief Progress Note Patient Name: Erik [name] DOB: 1996-02-20 MRN: [id number]   Date of Service  11/09/2014  HPI/Events of Note  continuied LE pain - fent 36mcg prn is not enogh  eICU Interventions  Increase fent dose range     Intervention Category Intermediate Interventions: Pain - evaluation and management  Svetlana Bagby 11/09/2014, 5:05 AM

## 2014-11-09 NOTE — Progress Notes (Signed)
Patient Name: Erik [name] Date of Encounter: 11/09/2014  Active Problems:   Septic shock   Length of Stay: 1  SUBJECTIVE  Substantially improved. No pressors, decent urine output. Creatinine markedly abnormal, but essentially unchanged. Echo showed moderately depressed LVEF, but with a pattern of diffuse involvement. Minor increase in troponin, peaking around 2. Peroneal myositis/rhabdomyolysis is the major substrate for his CK release.  CURRENT MEDS . heparin  5,000 Units Subcutaneous 3 times per day  . levofloxacin (LEVAQUIN) IV  750 mg Intravenous Q48H  . lidocaine  1 patch Transdermal Q24H  . magnesium sulfate 1 - 4 g bolus IVPB  2 g Intravenous Once  . magnesium sulfate 1 - 4 g bolus IVPB  4 g Intravenous Once  . nicotine  7 mg Transdermal Daily  . potassium chloride  40 mEq Oral Once  . vancomycin  1,000 mg Intravenous Q24H     OBJECTIVE   Intake/Output Summary (Last 24 hours) at 11/09/14 0809 Last data filed at 11/09/14 0622  Gross per 24 hour  Intake 6941.73 ml  Output   1701 ml  Net 5240.73 ml   Filed Weights   11/08/14 0652 11/08/14 1206 11/09/14 0451  Weight: 130 lb (58.968 kg) 148 lb 2.4 oz (67.2 kg) 157 lb 6.5 oz (71.4 kg)    PHYSICAL EXAM Filed Vitals:   11/09/14 0334 11/09/14 0400 11/09/14 0451 11/09/14 0600  BP:  130/80  138/75  Pulse:  85  76  Temp: 98.8 F (37.1 C)     TempSrc: Oral     Resp:  12  17  Height:      Weight:   157 lb 6.5 oz (71.4 kg)   SpO2:  90%  89%   General: Alert, oriented x3, no distress Head: no evidence of trauma, PERRL, EOMI, no exophtalmos or lid lag, no myxedema, no xanthelasma; normal ears, nose and oropharynx Neck: normal jugular venous pulsations and no hepatojugular reflux; brisk carotid pulses without delay and no carotid bruits Chest: clear to auscultation, no signs of consolidation by percussion or palpation, normal fremitus, symmetrical and full respiratory excursions Cardiovascular: normal position and  quality of the apical impulse, regular rhythm, normal first and second heart sounds, no rubs or gallops, no murmur Abdomen: no tenderness or distention, no masses by palpation, no abnormal pulsatility or arterial bruits, normal bowel sounds, no hepatosplenomegaly Extremities: no clubbing, cyanosis or edema; 2+ radial, ulnar and brachial pulses bilaterally; 2+ right femoral, posterior tibial and dorsalis pedis pulses; 2+ left femoral, posterior tibial and dorsalis pedis pulses; no subclavian or femoral bruits Neurological: grossly nonfocal  LABS  CBC  Recent Labs  11/08/14 0652 11/08/14 0927 11/09/14 0450  WBC 31.4* 24.8* 15.9*  NEUTROABS 26.7*  --   --   HGB 16.0 14.4 12.5*  HCT 47.0 42.8 36.5*  MCV 89.5 89.5 85.9  PLT PLATELET CLUMPS NOTED ON SMEAR, COUNT APPEARS ADEQUATE 239 505*   Basic Metabolic Panel  Recent Labs  11/08/14 2120 11/09/14 0450  NA 137 134*  K 3.3* 3.3*  CL 101 99*  CO2 26 26  GLUCOSE 104* 131*  BUN 37* 35*  CREATININE 2.94* 2.99*  CALCIUM 6.9* 6.3*  MG  --  1.1*  PHOS  --  3.6   Liver Function Tests  Recent Labs  11/08/14 0652 11/08/14 0927  AST 226* 234*  ALT 141* 121*  ALKPHOS 145* 70  BILITOT 0.4 0.3  PROT 8.0 6.1*  ALBUMIN 4.8 3.6   No results for input(s): LIPASE,  AMYLASE in the last 72 hours. Cardiac Enzymes  Recent Labs  11/08/14 0927 11/08/14 1440 11/08/14 1620  CKTOTAL  --  17408* 32370*  CKMB  --   --  >260.0*  TROPONINI 2.34*  --   --     Radiology Studies Imaging results have been reviewed and Mr Tibia Fibula Left Wo Contrast  11/08/2014   CLINICAL DATA:  Acute left leg pain.  EXAM: MRI OF LOWER LEFT EXTREMITY WITHOUT CONTRAST  TECHNIQUE: Multiplanar, multisequence MR imaging of the left lower leg was performed. No intravenous contrast was administered.  COMPARISON:  None.  FINDINGS: There is no marrow signal abnormality. There is no fracture or dislocation. There is no periosteal reaction.  There is no fluid collection  or hematoma. There is nonspecific edema within the left peroneus brevis and peroneus longus muscles. There is a small amount of perifascial fluid. The remainder of the musculature of the left lower leg and the right lower leg are normal.  IMPRESSION: 1. Nonspecific edema within the left peroneus brevis and peroneus longus muscles. This appearance can be seen with myositis secondary to an infectious or inflammatory process versus infarction versus muscle strain. Infarct versus myositis would be difficult to delineate in the absence of intravenous contrast.   Electronically Signed   By: Kathreen Devoid   On: 11/08/2014 21:11   Dg Chest Port 1 View  11/08/2014   CLINICAL DATA:  Acute respiratory failure with hypoxemia - sepsis - rising lactic acid level  EXAM: PORTABLE CHEST - 1 VIEW  COMPARISON:  11/08/2014  FINDINGS: Left subclavian central line tip overlies the level of superior vena cava and appears unchanged. Heart is normal in size. Mild perihilar atelectasis.  IMPRESSION: Minimal perihilar atelectasis.   Electronically Signed   By: Nolon Nations M.D.   On: 11/08/2014 19:25   Dg Chest Port 1 View  11/08/2014   CLINICAL DATA:  Central catheter placement  EXAM: PORTABLE CHEST - 1 VIEW  COMPARISON:  Study obtained earlier in the day  FINDINGS: Central catheter tip is in the superior vena cava just above the cavoatrial junction. No pneumothorax. Lungs clear. Heart is upper normal in size with pulmonary vascularity within normal limits. No adenopathy. No bone lesions.  IMPRESSION: Central catheter tip in superior vena cava just above cavoatrial junction. No pneumothorax. Lungs clear.   Electronically Signed   By: Lowella Grip III M.D.   On: 11/08/2014 09:41   Dg Chest Port 1 View  11/08/2014   CLINICAL DATA:  Found on floor with drugs.  Hypotensive.  Hypoxic.  EXAM: PORTABLE CHEST - 1 VIEW  COMPARISON:  None.  FINDINGS: A single AP portable view of the chest demonstrates no focal airspace consolidation or  alveolar edema. The lungs are grossly clear. There is no large effusion or pneumothorax. Cardiac and mediastinal contours appear unremarkable.  IMPRESSION: No active disease.   Electronically Signed   By: Andreas Newport M.D.   On: 11/08/2014 06:53    TELE Sinus tachycardia  ECG normal  ASSESSMENT AND PLAN  Suspect myocardial dysfunction is a transient event related to hypotension and severe metabolic abnormalities in the setting of heroin overdose and sepsis. The troponin release has a similar mechanism. So far no evidence of bacteremia, no vegetations on transthoracic echo. Expect to see recovery of LV function in several days, one week.  If blood cultures are positive or he has a course of protracted infectious syndrome, TEE is appropriate. Otherwise, would repeat echo in a week  to confirm expected resolution of myocardial stunning.   Sanda Klein, MD, [facility] [phone] office [phone] pager 11/09/2014 8:09 AM

## 2014-11-09 NOTE — ED Provider Notes (Signed)
Medical screening examination/treatment/procedure(s) were conducted as a shared visit with non-physician practitioner(s) and myself.  I personally evaluated the patient during the encounter.   EKG Interpretation   Date/Time:  Friday November 08 2014 06:54:44 EDT Ventricular Rate:  104 PR Interval:  151 QRS Duration: 108 QT Interval:  337 QTC Calculation: 443 R Axis:   147 Text Interpretation:  Sinus tachycardia RSR' in V1 or V2, probably normal  variant ST elevation suggests acute pericarditis no reciprocal changes No  old tracing to compare Confirmed by Carbon ([id number]) on 11/08/2014  6:57:23 AM       I evaluated this patient until 0730 on 9/2. Patient comes in after heroin overdose, given small doses of narcan with good response. BP remains low and responsive to fluids. C/o CP and dyspnea. No murmur but concern for possible endocarditis given drug abuse, dyspnea, CP and low BP. Continue IV fluids, follow labs, CXR and dispo accordingly. If remains hypoxic will need CTA to r/o PE as cause of dyspnea with low BP.  Sherwood Gambler, MD 11/09/14 [phone]

## 2014-11-09 NOTE — H&P (Addendum)
PULMONARY / CRITICAL CARE MEDICINE   Name: Josue Falconi MRN: [id number] DOB: [birth year]    ADMISSION DATE:  11/08/2014 CONSULTATION DATE:  9/2  REFERRING MD :  Zenia Resides   CHIEF COMPLAINT:  Septic shock and overdose   INITIAL PRESENTATION:  19 year old male found down in apartment. Initially sats 84% room air and rr 4, hypotensive. Placed on narcan gtt. Later admitted to IV injection of heroin and NorFentanyl. Admitted w/ working dx of narcotic overdose, septic shock and MODS, r/o endocarditis.   STUDIES:  ECHO 9/2>>>35% EF, no veg mentioned 9/2 doppler leg>>>neg 9/2 MRI leg>>>. Nonspecific edema within the left peroneus brevis and peroneus longus muscles. This appearance can be seen with myositis   SIGNIFICANT EVENTS: 9/2-off pressors, leg pain, MRI done leg  SUBJECTIVE:  Leg pain  VITAL SIGNS: Temp:  [98.3 F (36.8 C)-100.6 F (38.1 C)] 98.8 F (37.1 C) (09/03 0334) Pulse Rate:  [76-113] 76 (09/03 0600) Resp:  [12-32] 17 (09/03 0600) BP: (54-156)/(14-116) 138/75 mmHg (09/03 0600) SpO2:  [89 %-100 %] 89 % (09/03 0600) Weight:  [67.132 kg (148 lb)-71.4 kg (157 lb 6.5 oz)] 71.4 kg (157 lb 6.5 oz) (09/03 0451) HEMODYNAMICS: CVP:  [6 mmHg-16 mmHg] 6 mmHg VENTILATOR SETTINGS:   INTAKE / OUTPUT:  Intake/Output Summary (Last 24 hours) at 11/09/14 0747 Last data filed at 11/09/14 7846  Gross per 24 hour  Intake 6941.73 ml  Output   1701 ml  Net 5240.73 ml    PHYSICAL EXAMINATION: General:  19 year old male, acutely ill. Awake, not in distress, reports pain Neuro:  Awake, oriented, no focal strength but weak leg HEENT:  Bruising left lateral face. Otherwise NCAT Cardiovascular:  rrr s1 s 2 Lungs:  Clear, slight coarse after 5 liters up Abdomen:  Soft, non-tender + bowel sounds  Musculoskeletal:  Good tone, equal strength, not red or warm, no fluctuance Skin:  Warm, flushed, bounding pulses.   LABS:  CBC  Recent Labs Lab 11/08/14 0652 11/08/14 0927 11/09/14 0450   WBC 31.4* 24.8* 15.9*  HGB 16.0 14.4 12.5*  HCT 47.0 42.8 36.5*  PLT PLATELET CLUMPS NOTED ON SMEAR, COUNT APPEARS ADEQUATE 239 142*   Coag's  Recent Labs Lab 11/08/14 0927  APTT 28  INR 1.41   BMET  Recent Labs Lab 11/08/14 1440 11/08/14 2120 11/09/14 0450  NA 137 137 134*  K 4.2 3.3* 3.3*  CL 108 101 99*  CO2 19* 26 26  BUN 35* 37* 35*  CREATININE 2.84* 2.94* 2.99*  GLUCOSE 296* 104* 131*   Electrolytes  Recent Labs Lab 11/08/14 1440 11/08/14 2120 11/09/14 0450  CALCIUM 6.7* 6.9* 6.3*  MG  --   --  1.1*  PHOS  --   --  3.6   Sepsis Markers  Recent Labs Lab 11/08/14 1400 11/08/14 1700 11/08/14 2120  LATICACIDVEN 4.4* 4.7* 1.8   ABG  Recent Labs Lab 11/08/14 1140 11/08/14 1857 11/09/14 0450  PHART 7.265* 7.367 7.405  PCO2ART 35.0 34.6* 42.7  PO2ART 132* 80.4 71.2*   Liver Enzymes  Recent Labs Lab 11/08/14 0652 11/08/14 0927  AST 226* 234*  ALT 141* 121*  ALKPHOS 145* 70  BILITOT 0.4 0.3  ALBUMIN 4.8 3.6   Cardiac Enzymes  Recent Labs Lab 11/08/14 0927  TROPONINI 2.34*   Glucose No results for input(s): GLUCAP in the last 168 hours.  Imaging Mr Tibia Fibula Left Wo Contrast  11/08/2014   CLINICAL DATA:  Acute left leg pain.  EXAM: MRI OF  LOWER LEFT EXTREMITY WITHOUT CONTRAST  TECHNIQUE: Multiplanar, multisequence MR imaging of the left lower leg was performed. No intravenous contrast was administered.  COMPARISON:  None.  FINDINGS: There is no marrow signal abnormality. There is no fracture or dislocation. There is no periosteal reaction.  There is no fluid collection or hematoma. There is nonspecific edema within the left peroneus brevis and peroneus longus muscles. There is a small amount of perifascial fluid. The remainder of the musculature of the left lower leg and the right lower leg are normal.  IMPRESSION: 1. Nonspecific edema within the left peroneus brevis and peroneus longus muscles. This appearance can be seen with  myositis secondary to an infectious or inflammatory process versus infarction versus muscle strain. Infarct versus myositis would be difficult to delineate in the absence of intravenous contrast.   Electronically Signed   By: Kathreen Devoid   On: 11/08/2014 21:11   Dg Chest Port 1 View  11/08/2014   CLINICAL DATA:  Acute respiratory failure with hypoxemia - sepsis - rising lactic acid level  EXAM: PORTABLE CHEST - 1 VIEW  COMPARISON:  11/08/2014  FINDINGS: Left subclavian central line tip overlies the level of superior vena cava and appears unchanged. Heart is normal in size. Mild perihilar atelectasis.  IMPRESSION: Minimal perihilar atelectasis.   Electronically Signed   By: Nolon Nations M.D.   On: 11/08/2014 19:25   Dg Chest Port 1 View  11/08/2014   CLINICAL DATA:  Central catheter placement  EXAM: PORTABLE CHEST - 1 VIEW  COMPARISON:  Study obtained earlier in the day  FINDINGS: Central catheter tip is in the superior vena cava just above the cavoatrial junction. No pneumothorax. Lungs clear. Heart is upper normal in size with pulmonary vascularity within normal limits. No adenopathy. No bone lesions.  IMPRESSION: Central catheter tip in superior vena cava just above cavoatrial junction. No pneumothorax. Lungs clear.   Electronically Signed   By: Lowella Grip III M.D.   On: 11/08/2014 09:41     ASSESSMENT / PLAN:  PULMONARY OETT  A:  Acute hypoxic respiratory failure in setting of narcotic induced respiratory failure At risk edema P:   Supplemental Oxygen needed, add, may need to  Narcan gtt off, would not use anyway with short acting heroin Pulse ox needed, keep sats 92% IS  CARDIOVASCULAR CVL left [place] CVL 9/2>>> A:  Septic shock/MODS - resolved shock state Rhabdo Exam unimpressive for compartment syndrome but at risk P:  Volume resuscitation completed, need to limit to not increase leg edema / pulm as BP now off pressors  cvp were not done, would like to order MAP goal >  60 Keep line for now Frequent pulse checks left leg  RENAL A:   AKI Lactic acidosis -->improving Component of NAG acidosis- resolved hypomag hypoK P:   Bicarb gtt dc, does not change outcomes in rhabdo and K is corrected Repeat chem q8h Keep foley for today May need lasix plus volume - for rhabdo treatment Mag supp k supp  GASTROINTESTINAL A:   Shock liver in setting of MODS and IVDA P:   Ice chips only- advance diet LFTs in am  ppi likely can dc on full diet  HEMATOLOGIC A:   Leukocytosis  Probable thrombocytopenia  P:  Trend cbc [place] heparin   INFECTIOUS A:   Septic shock unclear source - TTE neg veg thus far May be presentation of myositis P:   BCx2  9/2>>> UC 9/2>>> vanc 9/2>>> Aztreonam 9/2>>>9/3 Levofloxacin 9/2>>>  Dc aztreonam, if BC pos then TEE  ENDOCRINE A:   No acute  P:   Trend glucose in am   NEUROLOGIC A:  Acute encephalopathy improved, some nerve injury from muscle imjury likely P:   RASS goal: 0 No role narcan fentanyl   FAMILY  - Updates: pt  - Inter-disciplinary family meet or Palliative Care meeting due by: 9/9  Ccm time 30 min   Lavon Paganini. Titus Mould, MD, [place] Pgr: [facility]

## 2014-11-10 DIAGNOSIS — N179 Acute kidney failure, unspecified: Secondary | ICD-10-CM

## 2014-11-10 DIAGNOSIS — T796XXA Traumatic ischemia of muscle, initial encounter: Secondary | ICD-10-CM

## 2014-11-10 DIAGNOSIS — M79605 Pain in left leg: Secondary | ICD-10-CM | POA: Insufficient documentation

## 2014-11-10 DIAGNOSIS — B181 Chronic viral hepatitis B without delta-agent: Secondary | ICD-10-CM

## 2014-11-10 LAB — COMPREHENSIVE METABOLIC PANEL
ALK PHOS: 50 U/L (ref 38–126)
ALT: 229 U/L — AB (ref 17–63)
AST: 545 U/L — AB (ref 15–41)
Albumin: 3 g/dL — ABNORMAL LOW (ref 3.5–5.0)
Anion gap: 7 (ref 5–15)
BILIRUBIN TOTAL: 0.7 mg/dL (ref 0.3–1.2)
BUN: 36 mg/dL — AB (ref 6–20)
CHLORIDE: 98 mmol/L — AB (ref 101–111)
CO2: 30 mmol/L (ref 22–32)
CREATININE: 3.67 mg/dL — AB (ref 0.61–1.24)
Calcium: 7.8 mg/dL — ABNORMAL LOW (ref 8.9–10.3)
GFR calc Af Amer: 26 mL/min — ABNORMAL LOW (ref >60)
GFR, EST NON AFRICAN AMERICAN: 22 mL/min — AB (ref >60)
Glucose, Bld: 119 mg/dL — ABNORMAL HIGH (ref 65–99)
Potassium: 3.4 mmol/L — ABNORMAL LOW (ref 3.5–5.1)
Sodium: 135 mmol/L (ref 135–145)
Total Protein: 5.2 g/dL — ABNORMAL LOW (ref 6.5–8.1)

## 2014-11-10 NOTE — Progress Notes (Signed)
ANTIBIOTIC CONSULT NOTE - FOLLOW UP  Pharmacy Consult for Vancomycin / Levaquin Indication: Sepsis  Allergies  Allergen Reactions  . Penicillins Swelling    Lips swell.    . Sulfa Antibiotics Swelling    Patient Measurements: Height: 5\' 6"  (167.6 cm) Weight: 148 lb 13 oz (67.5 kg) IBW/kg (Calculated) : 63.8 Adjusted Body Weight:   Vital Signs: Temp: 98.6 F (37 C) (09/04 0612) Temp Source: Oral (09/04 0612) BP: 156/90 mmHg (09/04 0612) Pulse Rate: 79 (09/04 0612) Intake/Output from previous day: 09/03 0701 - 09/04 0700 In: 990 [P.O.:600; I.V.:40; IV Piggyback:350] Out: 2600 [Urine:2600] Intake/Output from this shift: Total I/O In: 600 [P.O.:600] Out: -   Labs:  Recent Labs  11/08/14 0927  11/09/14 0450 11/09/14 0830 11/09/14 1434 11/09/14 2220 11/10/14 0556  WBC 24.8*  --  15.9* 14.3*  --   --   --   HGB 14.4  --  12.5* 12.8*  --   --   --   PLT 239  --  142* 131*  --   --   --   CREATININE 2.50*  < > 2.99*  --  3.47* 3.59* 3.67*  < > = values in this interval not displayed. Estimated Creatinine Clearance: 28.7 mL/min (by C-G formula based on Cr of 3.67). No results for input(s): VANCOTROUGH, VANCOPEAK, VANCORANDOM, GENTTROUGH, GENTPEAK, GENTRANDOM, TOBRATROUGH, TOBRAPEAK, TOBRARND, AMIKACINPEAK, AMIKACINTROU, AMIKACIN in the last 72 hours.   Microbiology: Recent Results (from the past 720 hour(s))  Blood Culture (routine x 2)     Status: None (Preliminary result)   Collection Time: 11/08/14  7:00 AM  Result Value Ref Range Status   Specimen Description BLOOD LEFT HAND  Final   Special Requests IN PEDIATRIC BOTTLE 3CC  Final   Culture   Final    NO GROWTH 2 DAYS Performed at [facility]    Report Status PENDING  Incomplete  Blood Culture (routine x 2)     Status: None (Preliminary result)   Collection Time: 11/08/14  7:15 AM  Result Value Ref Range Status   Specimen Description BLOOD RIGHT HAND  Final   Special Requests IN PEDIATRIC BOTTLE  4CC  Final   Culture   Final    NO GROWTH 2 DAYS Performed at [facility]    Report Status PENDING  Incomplete  Urine culture     Status: None   Collection Time: 11/08/14  9:14 AM  Result Value Ref Range Status   Specimen Description URINE, CATHETERIZED  Final   Special Requests NONE  Final   Culture   Final    NO GROWTH 1 DAY Performed at [facility]    Report Status 11/09/2014 FINAL  Final  MRSA PCR Screening     Status: None   Collection Time: 11/08/14 12:12 PM  Result Value Ref Range Status   MRSA by PCR NEGATIVE NEGATIVE Final    Comment:        The GeneXpert MRSA Assay (FDA approved for NASAL specimens only), is one component of a comprehensive MRSA colonization surveillance program. It is not intended to diagnose MRSA infection nor to guide or monitor treatment for MRSA infections.     Anti-infectives    Start     Dose/Rate Route Frequency Ordered Stop   11/09/14 0600  vancomycin (VANCOCIN) IVPB 1000 mg/200 mL premix  Status:  Discontinued     1,000 mg 200 mL/hr over 60 Minutes Intravenous Every 24 hours 11/08/14 0929 11/10/14 1350   11/08/14  1600  levofloxacin (LEVAQUIN) IVPB 750 mg     750 mg 100 mL/hr over 90 Minutes Intravenous Every 48 hours 11/08/14 1220     11/08/14 1400  piperacillin-tazobactam (ZOSYN) IVPB 3.375 g  Status:  Discontinued     3.375 g 12.5 mL/hr over 240 Minutes Intravenous 3 times per day 11/08/14 0929 11/08/14 1219   11/08/14 1400  aztreonam (AZACTAM) 1 g in dextrose 5 % 50 mL IVPB  Status:  Discontinued     1 g 100 mL/hr over 30 Minutes Intravenous 3 times per day 11/08/14 1220 11/09/14 0806   11/08/14 0815  piperacillin-tazobactam (ZOSYN) IVPB 3.375 g     3.375 g 100 mL/hr over 30 Minutes Intravenous  Once 11/08/14 0809 11/08/14 0923   11/08/14 0815  vancomycin (VANCOCIN) IVPB 1000 mg/200 mL premix     1,000 mg 200 mL/hr over 60 Minutes Intravenous  Once 11/08/14 0809 11/08/14 0958      Assessment: 58 YOM  presents with heroin overdose and rhabdomyolysis and started on IV antibiotics for sepsis. Allergies reported to pencillin and sulfa with reaction of lip swelling and zosyn changed to aztreonam/levaquin.  Anti-infectives 9/2 >> vancomycin  >> 9/2 >> pip/tazo  >>  9/2 stopped due to updated allergy to PCN (lip swelling) 9/2 >> aztreonam >> 9/3 9/2 >> levaquin >>  Vitals/Labs Lactic acid up to 4.7, now WNL WBC high, trending down Tm24h: 99.2 Renal: SCr continues rising though good UOP, CrCl now ~29 ml/mn (N32)  Cultures 9/2 blood: NGTD 9/2 urine: NGF 9/2 MRSA PCR negative  Dose changes/levels: 9/5: 0500 VT = ______ on Vanc 1g q24h  Goal of Therapy:  Vancomycin trough level 15-20 mcg/ml  Eradication of infection  Plan:  Day 3 antibiotics Continue levaquin 750mg  IV q48h  Borderline to adjust dose, f/u up BMET in AM Continue vancomycin 1g IV q24h  Check vanc trough in AM  Discontinued active order, will need to re-enter as appropriate after trough assessed  Ralene Bathe, PharmD, BCPS 11/10/2014, 2:05 PM  Pager: [phone]
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
## 2014-11-11 DIAGNOSIS — R7989 Other specified abnormal findings of blood chemistry: Secondary | ICD-10-CM

## 2014-11-11 LAB — COMPREHENSIVE METABOLIC PANEL
ALK PHOS: 53 U/L (ref 38–126)
ALT: 195 U/L — AB (ref 17–63)
AST: 343 U/L — AB (ref 15–41)
Albumin: 3 g/dL — ABNORMAL LOW (ref 3.5–5.0)
Anion gap: 6 (ref 5–15)
BILIRUBIN TOTAL: 0.9 mg/dL (ref 0.3–1.2)
BUN: 36 mg/dL — AB (ref 6–20)
CALCIUM: 8.1 mg/dL — AB (ref 8.9–10.3)
CHLORIDE: 99 mmol/L — AB (ref 101–111)
CO2: 29 mmol/L (ref 22–32)
CREATININE: 3.74 mg/dL — AB (ref 0.61–1.24)
GFR, EST AFRICAN AMERICAN: 25 mL/min — AB (ref >60)
GFR, EST NON AFRICAN AMERICAN: 22 mL/min — AB (ref >60)
Glucose, Bld: 113 mg/dL — ABNORMAL HIGH (ref 65–99)
Potassium: 3.3 mmol/L — ABNORMAL LOW (ref 3.5–5.1)
Sodium: 134 mmol/L — ABNORMAL LOW (ref 135–145)
TOTAL PROTEIN: 5.7 g/dL — AB (ref 6.5–8.1)

## 2014-11-11 LAB — CBC WITH DIFFERENTIAL/PLATELET
BASOS PCT: 0 % (ref 0–1)
Basophils Absolute: 0 10*3/uL (ref 0.0–0.1)
EOS ABS: 0 10*3/uL (ref 0.0–0.7)
EOS PCT: 0 % (ref 0–5)
HCT: 36.1 % — ABNORMAL LOW (ref 39.0–52.0)
Hemoglobin: 12.8 g/dL — ABNORMAL LOW (ref 13.0–17.0)
LYMPHS ABS: 1.6 10*3/uL (ref 0.7–4.0)
Lymphocytes Relative: 16 % (ref 12–46)
MCH: 29.8 pg (ref 26.0–34.0)
MCHC: 35.5 g/dL (ref 30.0–36.0)
MCV: 84 fL (ref 78.0–100.0)
Monocytes Absolute: 1 10*3/uL (ref 0.1–1.0)
Monocytes Relative: 9 % (ref 3–12)
NEUTROS PCT: 75 % (ref 43–77)
Neutro Abs: 7.8 10*3/uL — ABNORMAL HIGH (ref 1.7–7.7)
PLATELETS: 121 10*3/uL — AB (ref 150–400)
RBC: 4.3 MIL/uL (ref 4.22–5.81)
RDW: 11.6 % (ref 11.5–15.5)
WBC: 10.4 10*3/uL (ref 4.0–10.5)

## 2014-11-11 LAB — VANCOMYCIN, TROUGH: Vancomycin Tr: 14 ug/mL (ref 10.0–20.0)

## 2014-11-11 MED ORDER — DIPHENHYDRAMINE HCL 25 MG PO CAPS
25.0000 mg | ORAL_CAPSULE | Freq: Every evening | ORAL | Status: DC | PRN
Start: 2014-11-11 — End: 2014-11-16
  Administered 2014-11-11 – 2014-11-15 (×7): 25 mg via ORAL
  Filled 2014-11-11 (×7): qty 1

## 2014-11-11 MED ORDER — VANCOMYCIN HCL IN DEXTROSE 1-5 GM/200ML-% IV SOLN
1000.0000 mg | INTRAVENOUS | Status: DC
  Administered 2014-11-11 – 2014-11-12 (×2): 1000 mg via INTRAVENOUS
  Filled 2014-11-11 (×2): qty 200

## 2014-11-11 NOTE — Progress Notes (Signed)
Patient Name: Erik [name] Date of Encounter: 11/11/2014     Active Problems:   Septic shock   Acute respiratory failure with hypoxemia   Pain in the chest   Elevated troponin   Encounter for central line care   IVDU (intravenous drug user)   Traumatic rhabdomyolysis   Acute kidney injury   Viral hepatitis B chronic   Left leg pain    SUBJECTIVE  No chest pain or dyspnea. Cultures negative so far. Telemetry shows NSR  CURRENT MEDS . heparin  5,000 Units Subcutaneous 3 times per day  . levofloxacin (LEVAQUIN) IV  750 mg Intravenous Q48H  . lidocaine  1 patch Transdermal Q24H  . nicotine  7 mg Transdermal Daily  . vancomycin  1,000 mg Intravenous Q24H    OBJECTIVE  Filed Vitals:   11/10/14 0612 11/10/14 1500 11/10/14 2218 11/11/14 0440  BP: 156/90 151/88 172/96 159/79  Pulse: 79 84 80 77  Temp: 98.6 F (37 C) 98.6 F (37 C) 99.1 F (37.3 C) 99.1 F (37.3 C)  TempSrc: Oral Oral Oral Oral  Resp: 18 20 20 20   Height:      Weight: 148 lb 13 oz (67.5 kg)   149 lb 7.6 oz (67.8 kg)  SpO2: 90% 94% 90% 92%    Intake/Output Summary (Last 24 hours) at 11/11/14 0849 Last data filed at 11/11/14 0700  Gross per 24 hour  Intake   2140 ml  Output   1350 ml  Net    790 ml   Filed Weights   11/09/14 0451 11/10/14 0612 11/11/14 0440  Weight: 157 lb 6.5 oz (71.4 kg) 148 lb 13 oz (67.5 kg) 149 lb 7.6 oz (67.8 kg)    PHYSICAL EXAM  General: Pleasant, NAD. Neuro: Alert and oriented X 3. Moves all extremities spontaneously. Psych: Normal affect. HEENT:  Normal  Neck: Supple without bruits or JVD. Lungs:  Resp regular and unlabored, CTA. Heart: RRR no s3, s4, or murmurs. Abdomen: Soft, non-tender, non-distended, BS + x 4.  Extremities: No clubbing, cyanosis or edema. DP/PT/Radials 2+ and equal bilaterally.  Accessory Clinical Findings  CBC  Recent Labs  11/09/14 0830 11/11/14 0431  WBC 14.3* 10.4  NEUTROABS 11.0* 7.8*  HGB 12.8* 12.8*  HCT 36.4* 36.1*    MCV 86.1 84.0  PLT 131* 676*   Basic Metabolic Panel  Recent Labs  11/09/14 0450  11/10/14 0556 11/11/14 0431  NA 134*  < > 135 134*  K 3.3*  < > 3.4* 3.3*  CL 99*  < > 98* 99*  CO2 26  < > 30 29  GLUCOSE 131*  < > 119* 113*  BUN 35*  < > 36* 36*  CREATININE 2.99*  < > 3.67* 3.74*  CALCIUM 6.3*  < > 7.8* 8.1*  MG 1.1*  --   --   --   PHOS 3.6  --   --   --   < > = values in this interval not displayed. Liver Function Tests  Recent Labs  11/10/14 0556 11/11/14 0431  AST 545* 343*  ALT 229* 195*  ALKPHOS 50 53  BILITOT 0.7 0.9  PROT 5.2* 5.7*  ALBUMIN 3.0* 3.0*   No results for input(s): LIPASE, AMYLASE in the last 72 hours. Cardiac Enzymes  Recent Labs  11/08/14 0927  11/08/14 1620 11/09/14 0450 11/09/14 0830  CKTOTAL  --   < > 32370* 34238* 36294*  72094*  CKMB  --   --  >260.0* >260.0* >260.0*  TROPONINI 2.34*  --   --   --   --   < > = values in this interval not displayed. BNP Invalid input(s): POCBNP D-Dimer No results for input(s): DDIMER in the last 72 hours. Hemoglobin A1C No results for input(s): HGBA1C in the last 72 hours. Fasting Lipid Panel No results for input(s): CHOL, HDL, LDLCALC, TRIG, CHOLHDL, LDLDIRECT in the last 72 hours. Thyroid Function Tests No results for input(s): TSH, T4TOTAL, T3FREE, THYROIDAB in the last 72 hours.  Invalid input(s): FREET3  TELE  NSR  ECG    Radiology/Studies  Mr Tibia Fibula Left Wo Contrast  11/08/2014   CLINICAL DATA:  Acute left leg pain.  EXAM: MRI OF LOWER LEFT EXTREMITY WITHOUT CONTRAST  TECHNIQUE: Multiplanar, multisequence MR imaging of the left lower leg was performed. No intravenous contrast was administered.  COMPARISON:  None.  FINDINGS: There is no marrow signal abnormality. There is no fracture or dislocation. There is no periosteal reaction.  There is no fluid collection or hematoma. There is nonspecific edema within the left peroneus brevis and peroneus longus muscles. There is a  small amount of perifascial fluid. The remainder of the musculature of the left lower leg and the right lower leg are normal.  IMPRESSION: 1. Nonspecific edema within the left peroneus brevis and peroneus longus muscles. This appearance can be seen with myositis secondary to an infectious or inflammatory process versus infarction versus muscle strain. Infarct versus myositis would be difficult to delineate in the absence of intravenous contrast.   Electronically Signed   By: Kathreen Devoid   On: 11/08/2014 21:11   Dg Chest Port 1 View  11/08/2014   CLINICAL DATA:  Acute respiratory failure with hypoxemia - sepsis - rising lactic acid level  EXAM: PORTABLE CHEST - 1 VIEW  COMPARISON:  11/08/2014  FINDINGS: Left subclavian central line tip overlies the level of superior vena cava and appears unchanged. Heart is normal in size. Mild perihilar atelectasis.  IMPRESSION: Minimal perihilar atelectasis.   Electronically Signed   By: Nolon Nations M.D.   On: 11/08/2014 19:25   Dg Chest Port 1 View  11/08/2014   CLINICAL DATA:  Central catheter placement  EXAM: PORTABLE CHEST - 1 VIEW  COMPARISON:  Study obtained earlier in the day  FINDINGS: Central catheter tip is in the superior vena cava just above the cavoatrial junction. No pneumothorax. Lungs clear. Heart is upper normal in size with pulmonary vascularity within normal limits. No adenopathy. No bone lesions.  IMPRESSION: Central catheter tip in superior vena cava just above cavoatrial junction. No pneumothorax. Lungs clear.   Electronically Signed   By: Lowella Grip III M.D.   On: 11/08/2014 09:41   Dg Chest Port 1 View  11/08/2014   CLINICAL DATA:  Found on floor with drugs.  Hypotensive.  Hypoxic.  EXAM: PORTABLE CHEST - 1 VIEW  COMPARISON:  None.  FINDINGS: A single AP portable view of the chest demonstrates no focal airspace consolidation or alveolar edema. The lungs are grossly clear. There is no large effusion or pneumothorax. Cardiac and mediastinal  contours appear unremarkable.  IMPRESSION: No active disease.   Electronically Signed   By: Andreas Newport M.D.   On: 11/08/2014 06:53    ASSESSMENT AND PLAN Suspect myocardial dysfunction is a transient event related to hypotension and severe metabolic abnormalities in the setting of heroin overdose and sepsis. The troponin release has a similar mechanism. So far no evidence of bacteremia, no vegetations on transthoracic  echo. Expect to see recovery of LV function in several days, one week.  If blood cultures are positive or he has a course of protracted infectious syndrome, TEE is appropriate. Otherwise, would repeat echo in a week to confirm expected resolution of myocardial stunning.   Signed, Darlin Coco MD

## 2014-11-11 NOTE — Progress Notes (Signed)
ANTIBIOTIC CONSULT NOTE - FOLLOW UP  Pharmacy Consult for Vancomycin / Levaquin Indication: Sepsis  Allergies  Allergen Reactions  . Penicillins Swelling    Lips swell.    . Sulfa Antibiotics Swelling    Patient Measurements: Height: 5\' 6"  (167.6 cm) Weight: 149 lb 7.6 oz (67.8 kg) IBW/kg (Calculated) : 63.8 Adjusted Body Weight:   Vital Signs: Temp: 99.1 F (37.3 C) (09/05 0440) Temp Source: Oral (09/05 0440) BP: 159/79 mmHg (09/05 0440) Pulse Rate: 77 (09/05 0440) Intake/Output from previous day: 09/04 0701 - 09/05 0700 In: 2140 [P.O.:840; I.V.:1150; IV Piggyback:150] Out: 1350 [Urine:1350] Intake/Output from this shift:    Labs:  Recent Labs  11/09/14 0450 11/09/14 0830  11/09/14 2220 11/10/14 0556 11/11/14 0431  WBC 15.9* 14.3*  --   --   --  10.4  HGB 12.5* 12.8*  --   --   --  12.8*  PLT 142* 131*  --   --   --  121*  CREATININE 2.99*  --   < > 3.59* 3.67* 3.74*  < > = values in this interval not displayed. Estimated Creatinine Clearance: 28.2 mL/min (by C-G formula based on Cr of 3.74).  Recent Labs  11/11/14 0431  [facility] 14     Microbiology: Recent Results (from the past 720 hour(s))  Blood Culture (routine x 2)     Status: None (Preliminary result)   Collection Time: 11/08/14  7:00 AM  Result Value Ref Range Status   Specimen Description BLOOD LEFT HAND  Final   Special Requests IN PEDIATRIC BOTTLE 3CC  Final   Culture   Final    NO GROWTH 2 DAYS Performed at [facility]    Report Status PENDING  Incomplete  Blood Culture (routine x 2)     Status: None (Preliminary result)   Collection Time: 11/08/14  7:15 AM  Result Value Ref Range Status   Specimen Description BLOOD RIGHT HAND  Final   Special Requests IN PEDIATRIC BOTTLE 4CC  Final   Culture   Final    NO GROWTH 2 DAYS Performed at [facility]    Report Status PENDING  Incomplete  Urine culture     Status: None   Collection Time: 11/08/14  9:14 AM  Result  Value Ref Range Status   Specimen Description URINE, CATHETERIZED  Final   Special Requests NONE  Final   Culture   Final    NO GROWTH 1 DAY Performed at [facility]    Report Status 11/09/2014 FINAL  Final  MRSA PCR Screening     Status: None   Collection Time: 11/08/14 12:12 PM  Result Value Ref Range Status   MRSA by PCR NEGATIVE NEGATIVE Final    Comment:        The GeneXpert MRSA Assay (FDA approved for NASAL specimens only), is one component of a comprehensive MRSA colonization surveillance program. It is not intended to diagnose MRSA infection nor to guide or monitor treatment for MRSA infections.     Anti-infectives    Start     Dose/Rate Route Frequency Ordered Stop   11/09/14 0600  vancomycin (VANCOCIN) IVPB 1000 mg/200 mL premix  Status:  Discontinued     1,000 mg 200 mL/hr over 60 Minutes Intravenous Every 24 hours 11/08/14 0929 11/10/14 1350   11/08/14 1600  levofloxacin (LEVAQUIN) IVPB 750 mg     750 mg 100 mL/hr over 90 Minutes Intravenous Every 48 hours 11/08/14 1220  11/08/14 1400  piperacillin-tazobactam (ZOSYN) IVPB 3.375 g  Status:  Discontinued     3.375 g 12.5 mL/hr over 240 Minutes Intravenous 3 times per day 11/08/14 0929 11/08/14 1219   11/08/14 1400  aztreonam (AZACTAM) 1 g in dextrose 5 % 50 mL IVPB  Status:  Discontinued     1 g 100 mL/hr over 30 Minutes Intravenous 3 times per day 11/08/14 1220 11/09/14 0806   11/08/14 0815  piperacillin-tazobactam (ZOSYN) IVPB 3.375 g     3.375 g 100 mL/hr over 30 Minutes Intravenous  Once 11/08/14 0809 11/08/14 0923   11/08/14 0815  vancomycin (VANCOCIN) IVPB 1000 mg/200 mL premix     1,000 mg 200 mL/hr over 60 Minutes Intravenous  Once 11/08/14 0809 11/08/14 0958      Assessment: 72 YOM presents with heroin overdose and rhabdomyolysis and started on IV antibiotics for sepsis. Allergies reported to pencillin and sulfa with reaction of lip swelling and zosyn changed to  aztreonam/levaquin.  Anti-infectives 9/2 >> vancomycin  >> 9/2 >> pip/tazo  >>  9/2 stopped due to updated allergy to PCN (lip swelling) 9/2 >> aztreonam >> 9/3 9/2 >> levaquin >>  Vitals/Labs Lactic acid up to 4.7, now WNL WBC WNL Tm24h: Afebrile Renal: SCr continues rising though good UOP, CrCl now ~28 ml/mn (N32)  Cultures 9/2 blood: NGTD 9/2 urine: NGF 9/2 MRSA PCR negative  Dose changes/levels: 9/5: 0500 VT = 14 on Vanc 1g q24h  Trough is just slightly low, will continue same dose of vancomycin for now as accumulation will occur and pt still with AKI  Goal of Therapy:  Vancomycin trough level 15-20 mcg/ml  Eradication of infection  Plan:  Day 4 antibiotics Continue levaquin 750mg  IV q48h Continue vancomycin 1g IV q24h Follow renal function closely to adjust Consider rechecking vancomycin trough again with changes in renal function  Ralene Bathe, PharmD, BCPS 11/11/2014, 7:47 AM  Pager: [phone]
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
## 2014-11-11 NOTE — Evaluation (Signed)
Occupational Therapy Evaluation Patient Details Name: Erik [name] MRN: [id number] DOB: May 12, 1995 Today's Date: 11/11/2014    History of Present Illness 19 y.o. male found down in his apartment, admitted to Heroin use. Sats 84% on RA on admission.  Narcan administered.  Dx of septic shock, LLE edema (myositis?), rhabdo, respiratory failure, AKI.    Clinical Impression   Pt admitted with shock. Pt currently with functional limitations due to the deficits listed below (see OT Problem List). Pt will benefit from skilled OT to increase their safety and independence with ADL and functional mobility for ADL to facilitate discharge to venue listed below.     Follow Up Recommendations  No OT follow up    Equipment Recommendations  None recommended by OT       Precautions / Restrictions Precautions Precautions: Fall Precaution Comments: LLE numb      Mobility Bed Mobility Overal bed mobility: Modified Independent             General bed mobility comments: HOB up 30*, used rail  Transfers Overall transfer level: Needs assistance Equipment used: Rolling walker (2 wheeled)   Sit to Stand: Min guard         General transfer comment: verbal cues for hand placement, min/guard for balance    Balance     Sitting balance-Leahy Scale: Good       Standing balance-Leahy Scale: Poor                              ADL Overall ADL's : Needs assistance/impaired     Grooming: Sitting;Set up   Upper Body Bathing: Set up;Sitting   Lower Body Bathing: Minimal assistance;Sit to/from stand   Upper Body Dressing : Set up;Sitting   Lower Body Dressing: Moderate assistance;Sit to/from Health and safety inspector Details (indicate cue type and reason): unable to don L sock                           Pertinent Vitals/Pain Pain Score: 5  Pain Location: LLE Pain Descriptors / Indicators: Sore Pain Intervention(s): Monitored during session;Repositioned      Hand Dominance     Extremity/Trunk Assessment Upper Extremity Assessment LUE Deficits / Details: can elevate shoulder approx 90*, limited by pain, elbow and hand strength WNL, reports numbness L shoulder   Lower Extremity Assessment LLE Deficits / Details: ankle AROM decr 75% limited by pain, knee extension 3/5, decr sensation to light touch L medial foot and lateral thigh LLE Sensation: decreased light touch   Cervical / Trunk Assessment Cervical / Trunk Assessment: Normal   Communication Communication Communication: No difficulties   Cognition Arousal/Alertness: Awake/alert Behavior During Therapy: WFL for tasks assessed/performed Overall Cognitive Status: Within Functional Limits for tasks assessed                                Home Living Family/patient expects to be discharged to:: Private residence Living Arrangements: Parent Available Help at Discharge: Family;Available PRN/intermittently (parents work) Type of Home: House Home Access: Stairs to enter [employer] of Steps: 3   Home Layout: Two level;Able to live on main level with bedroom/bathroom     Bathroom Shower/Tub: Tub/shower unit   Bathroom Toilet: Standard     Home Equipment: None          Prior Functioning/Environment Level of  Independence: Independent        Comments: works at a bar    OT Diagnosis: Generalized weakness;Acute pain   OT Problem List: Decreased strength;Decreased activity tolerance;Decreased safety awareness   OT Treatment/Interventions: Self-care/ADL training;DME and/or AE instruction;Patient/family education    OT Goals(Current goals can be found in the care plan section) Acute Rehab OT Goals Patient Stated Goal: to get strength back in leg OT Goal Formulation: With patient Time For Goal Achievement: 11/25/14  OT Frequency: Min 2X/week    End of Session Equipment Utilized During Treatment: Surveyor, mining Communication: Mobility  status  Activity Tolerance: Patient tolerated treatment well Patient left: in chair;with call bell/phone within reach   Time: 1325-1350 OT Time Calculation (min): 25 min Charges:  OT General Charges $OT Visit: 1 Procedure OT Evaluation $Initial OT Evaluation Tier I: 1 Procedure OT Treatments $Self Care/Home Management : 8-22 mins G-Codes:    Payton Mccallum D 11-28-14, 2:34 PM
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
## 2014-11-11 NOTE — Evaluation (Addendum)
Physical Therapy Evaluation Patient Details Name: Erik [name] MRN: [id number] DOB: [birth year] Today's Date: 11/11/2014   History of Present Illness  19 y.o. male found down in his apartment, admitted to Heroin use. Sats 84% on RA on admission.  Narcan administered.  Dx of septic shock, LLE edema (myositis?), rhabdo, respiratory failure, AKI.   Clinical Impression  Pt admitted with above diagnosis. Pt currently with functional limitations due to the deficits listed below (see PT Problem List). Pt ambulated 22' with RW, distance limited by LLE pain. Pt has decreased sensation to light touch in L foot and thigh as well as limited L ankle strength/ROM. Instructed pt in independent exercises. Encouraged pt to ambulate in halls with assist from nursing or family. Pt will benefit from skilled PT to increase their independence and safety with mobility to allow discharge to the venue listed below.       Follow Up Recommendations Supervision for mobility/OOB;Outpatient PT    Equipment Recommendations  Rolling walker with 5" wheels    Recommendations for Other Services OT consult     Precautions / Restrictions Precautions Precautions: Fall Precaution Comments: LLE numb Restrictions Weight Bearing Restrictions: No      Mobility  Bed Mobility Overal bed mobility: Modified Independent             General bed mobility comments: HOB up 30*, used rail  Transfers Overall transfer level: Needs assistance Equipment used: Rolling walker (2 wheeled) Transfers: Sit to/from Stand Sit to Stand: Min guard         General transfer comment: verbal cues for hand placement, min/guard for balance  Ambulation/Gait Ambulation/Gait assistance: Min guard Ambulation Distance (Feet): 22 Feet Assistive device: Rolling walker (2 wheeled) Gait Pattern/deviations: Step-to pattern;Decreased step length - left;Decreased stance time - right;Antalgic;Decreased weight shift to left   Gait velocity  interpretation: Below normal speed for age/gender General Gait Details: pain with WB LLE, heavy use of BUEs, distance limited by pain  Stairs            Wheelchair Mobility    Modified Rankin (Stroke Patients Only)       Balance Overall balance assessment: Needs assistance   Sitting balance-Leahy Scale: Good     Standing balance support: Bilateral upper extremity supported Standing balance-Leahy Scale: Poor Standing balance comment: requires BUE support due to pain with WB LLE                             Pertinent Vitals/Pain Pain Assessment: 0-10 Pain Score: 7  Pain Location: LLE with activity Pain Descriptors / Indicators: Sore;Tightness Pain Intervention(s): Limited activity within patient's tolerance;Monitored during session;Premedicated before session;Repositioned    Home Living                        Prior Function                 Hand Dominance        Extremity/Trunk Assessment   Upper Extremity Assessment: LUE deficits/detail       LUE Deficits / Details: can elevate shoulder approx 90*, limited by pain, elbow and hand strength WNL, reports numbness L shoulder   Lower Extremity Assessment: LLE deficits/detail   LLE Deficits / Details: ankle AROM decr 75% limited by pain, knee extension 3/5, decr sensation to light touch L medial foot and lateral thigh  Cervical / Trunk Assessment: Normal  Communication      Cognition  Arousal/Alertness: Awake/alert Behavior During Therapy: WFL for tasks assessed/performed Overall Cognitive Status: Within Functional Limits for tasks assessed                      General Comments      Exercises General Exercises - Lower Extremity Ankle Circles/Pumps: AAROM;Left;10 reps;Supine (with self assist from sheet) Long Arc Quad: AROM;Left;10 reps;Seated Heel Slides: AROM;Left;5 reps;Supine Hip ABduction/ADduction: AROM;Left;5 reps;Supine      Assessment/Plan    PT  Assessment Patient needs continued PT services  PT Diagnosis Difficulty walking;Acute pain   PT Problem List Decreased strength;Decreased range of motion;Decreased activity tolerance;Decreased balance;Pain;Decreased knowledge of use of DME;Decreased mobility  PT Treatment Interventions DME instruction;Gait training;Stair training;Therapeutic activities;Patient/family education;Therapeutic exercise   PT Goals (Current goals can be found in the Care Plan section) Acute Rehab PT Goals Patient Stated Goal: to get strength back in leg PT Goal Formulation: With patient Time For Goal Achievement: 11/25/14 Potential to Achieve Goals: Good    Frequency Min 3X/week   Barriers to discharge        Co-evaluation               End of Session Equipment Utilized During Treatment: Gait belt Activity Tolerance: Patient limited by pain Patient left: in chair;with call bell/phone within reach Nurse Communication: Mobility status         Time: 0820-0846 PT Time Calculation (min) (ACUTE ONLY): 26 min   Charges:   PT Evaluation $Initial PT Evaluation Tier I: 1 Procedure PT Treatments $Gait Training: 8-22 mins   PT G Codes:        Philomena Doheny 11/11/2014, 9:23 AM [phone]

## 2014-11-12 LAB — RENAL FUNCTION PANEL
Albumin: 2.9 g/dL — ABNORMAL LOW (ref 3.5–5.0)
Anion gap: 8 (ref 5–15)
BUN: 40 mg/dL — AB (ref 6–20)
CHLORIDE: 95 mmol/L — AB (ref 101–111)
CO2: 26 mmol/L (ref 22–32)
CREATININE: 4.55 mg/dL — AB (ref 0.61–1.24)
Calcium: 7.8 mg/dL — ABNORMAL LOW (ref 8.9–10.3)
GFR calc non Af Amer: 17 mL/min — ABNORMAL LOW (ref >60)
GFR, EST AFRICAN AMERICAN: 20 mL/min — AB (ref >60)
Glucose, Bld: 111 mg/dL — ABNORMAL HIGH (ref 65–99)
POTASSIUM: 3.3 mmol/L — AB (ref 3.5–5.1)
Phosphorus: 3.8 mg/dL (ref 2.5–4.6)
Sodium: 129 mmol/L — ABNORMAL LOW (ref 135–145)

## 2014-11-12 LAB — URINALYSIS, ROUTINE W REFLEX MICROSCOPIC
Bilirubin Urine: NEGATIVE
GLUCOSE, UA: NEGATIVE mg/dL
Ketones, ur: NEGATIVE mg/dL
LEUKOCYTES UA: NEGATIVE
NITRITE: NEGATIVE
PROTEIN: NEGATIVE mg/dL
Specific Gravity, Urine: 1.006 (ref 1.005–1.030)
Urobilinogen, UA: 0.2 mg/dL (ref 0.0–1.0)
pH: 7.5 (ref 5.0–8.0)

## 2014-11-12 LAB — URINE MICROSCOPIC-ADD ON

## 2014-11-12 LAB — CK: Total CK: 5147 U/L — ABNORMAL HIGH (ref 49–397)

## 2014-11-12 MED ORDER — FUROSEMIDE 10 MG/ML IJ SOLN
80.0000 mg | Freq: Three times a day (TID) | INTRAMUSCULAR | Status: DC
  Administered 2014-11-12 – 2014-11-13 (×3): 80 mg via INTRAVENOUS
  Filled 2014-11-12 (×4): qty 8

## 2014-11-12 MED ORDER — FUROSEMIDE 10 MG/ML IJ SOLN
40.0000 mg | Freq: Every day | INTRAMUSCULAR | Status: DC
  Administered 2014-11-12: 40 mg via INTRAVENOUS
  Filled 2014-11-12: qty 4

## 2014-11-12 MED ORDER — POTASSIUM CHLORIDE CRYS ER 20 MEQ PO TBCR
30.0000 meq | EXTENDED_RELEASE_TABLET | Freq: Once | ORAL | Status: AC
  Administered 2014-11-12: 30 meq via ORAL
  Filled 2014-11-12 (×2): qty 1

## 2014-11-12 MED ORDER — HYDROMORPHONE HCL 1 MG/ML IJ SOLN
1.0000 mg | Freq: Two times a day (BID) | INTRAMUSCULAR | Status: DC | PRN
  Administered 2014-11-14 – 2014-11-16 (×4): 1 mg via INTRAVENOUS
  Filled 2014-11-12 (×4): qty 1

## 2014-11-12 MED ORDER — HYDROMORPHONE HCL 1 MG/ML IJ SOLN: 1.0000 mg | INTRAMUSCULAR | Status: DC | PRN

## 2014-11-12 MED ORDER — LEVOFLOXACIN IN D5W 500 MG/100ML IV SOLN
500.0000 mg | INTRAVENOUS | Status: DC
  Administered 2014-11-12: 500 mg via INTRAVENOUS
  Filled 2014-11-12: qty 100

## 2014-11-12 MED ORDER — OXYCODONE-ACETAMINOPHEN 5-325 MG PO TABS
1.0000 | ORAL_TABLET | ORAL | Status: DC | PRN
  Administered 2014-11-12 – 2014-11-13 (×8): 2 via ORAL
  Filled 2014-11-12 (×8): qty 2

## 2014-11-12 MED ORDER — POTASSIUM CHLORIDE CRYS ER 20 MEQ PO TBCR
40.0000 meq | EXTENDED_RELEASE_TABLET | Freq: Once | ORAL | Status: AC
  Administered 2014-11-12: 40 meq via ORAL
  Filled 2014-11-12: qty 2

## 2014-11-12 MED ORDER — SODIUM CHLORIDE 0.9 % IV SOLN
INTRAVENOUS | Status: DC
  Administered 2014-11-12: 09:00:00 via INTRAVENOUS

## 2014-11-12 MED ORDER — SODIUM CHLORIDE 0.9 % IV SOLN
INTRAVENOUS | Status: DC
  Administered 2014-11-12 – 2014-11-15 (×3): via INTRAVENOUS

## 2014-11-12 NOTE — Progress Notes (Signed)
  [facility] Progress Note   Subjective: UOP down to 500 cc  Filed Vitals:   11/11/14 2109 11/12/14 0100 11/12/14 0521 11/12/14 1419  BP: 172/79 142/73 144/81 150/68  Pulse: 63 64 62 61  Temp: 99.9 F (37.7 C) 99 F (37.2 C) 99 F (37.2 C) 98 F (36.7 C)  TempSrc: Oral Oral Oral Oral  Resp: 18 18 18 18   Height:      Weight:   72.031 kg (158 lb 12.8 oz)   SpO2: 97% 98% 99% 98%   Exam: Alert, no distress +JVD Chest is clear bilat no rales or wheezes RRR no MRG Abd soft, slightly tense, nontender, +bs, no ascites GU normal male LLE 2+ edema to thigh RLE 1+ edema Mild nonpitting UE edema bilat Neuro is alert , nf O x 3  UA > 300 prot, 11-20 rbc, 0-2 wbc, large Hb, turbid, +bact, amber CXR 9/2 > minimal atx ECHO LV 35-40%   Assessment: 1 Acute kidney injury - nonoliguric ATN from rhabdo/ shock; CK down to 5000,creat up 4.5 2 IVDA 3 Shock, resolved 4 Acute rhabdomyolysis, from pressure 5 Vol excess up 13kg   Plan - starting IV lasix 80 every 8 hrs, and NS at 40/ hr, try to increase urine volume and get some excess volume off    Kelly Splinter MD  pager [phone]    cell [phone]  11/12/2014, 4:53 PM     Recent Labs Lab 11/09/14 0450  11/10/14 0556 11/11/14 0431 11/12/14 0440  NA 134*  < > 135 134* 129*  K 3.3*  < > 3.4* 3.3* 3.3*  CL 99*  < > 98* 99* 95*  CO2 26  < > 30 29 26   GLUCOSE 131*  < > 119* 113* 111*  BUN 35*  < > 36* 36* 40*  CREATININE 2.99*  < > 3.67* 3.74* 4.55*  CALCIUM 6.3*  < > 7.8* 8.1* 7.8*  PHOS 3.6  --   --   --  3.8  < > = values in this interval not displayed.  Recent Labs Lab 11/08/14 0927 11/10/14 0556 11/11/14 0431 11/12/14 0440  AST 234* 545* 343*  --   ALT 121* 229* 195*  --   ALKPHOS 70 50 53  --   BILITOT 0.3 0.7 0.9  --   PROT 6.1* 5.2* 5.7*  --   ALBUMIN 3.6 3.0* 3.0* 2.9*    Recent Labs Lab 11/08/14 0652  11/09/14 0450 11/09/14 0830 11/11/14 0431  WBC 31.4*  < > 15.9* 14.3* 10.4   NEUTROABS 26.7*  --   --  11.0* 7.8*  HGB 16.0  < > 12.5* 12.8* 12.8*  HCT 47.0  < > 36.5* 36.4* 36.1*  MCV 89.5  < > 85.9 86.1 84.0  PLT PLATELET CLUMPS NOTED ON SMEAR, COUNT APPEARS ADEQUATE  < > 142* 131* 121*  < > = values in this interval not displayed. . heparin  5,000 Units Subcutaneous 3 times per day  . levofloxacin (LEVAQUIN) IV  500 mg Intravenous Q48H  . lidocaine  1 patch Transdermal Q24H  . nicotine  7 mg Transdermal Daily     diphenhydrAMINE, HYDROmorphone (DILAUDID) injection, ondansetron (ZOFRAN) IV, oxyCODONE-acetaminophen

## 2014-11-12 NOTE — Progress Notes (Addendum)
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   Discussed with Dr. Sallyanne Kuster, will obtain echo on Thursday and see on Friday, expect improvement in EF. Currently patient is struggling with acute kidney injury with nonoliguric ATN from rhabdo and shock. Shock resolved. Cardiology will followup from a distance.   Hilbert Corrigan PA Pager: [phone]
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
## 2014-11-12 NOTE — Progress Notes (Signed)
ANTIBIOTIC CONSULT NOTE - FOLLOW UP  Pharmacy Consult for Vancomycin / Levaquin Indication: Sepsis  Allergies  Allergen Reactions  . Penicillins Swelling    Lips swell.    . Sulfa Antibiotics Swelling    Patient Measurements: Height: 5\' 6"  (167.6 cm) Weight: 158 lb 12.8 oz (72.031 kg) IBW/kg (Calculated) : 63.8 Adjusted Body Weight:   Vital Signs: Temp: 99 F (37.2 C) (09/06 0521) Temp Source: Oral (09/06 0521) BP: 144/81 mmHg (09/06 0521) Pulse Rate: 62 (09/06 0521) Intake/Output from previous day: 09/05 0701 - 09/06 0700 In: 9629 [P.O.:720; I.V.:250; IV Piggyback:200] Out: 850 [Urine:850] Intake/Output from this shift:    Labs:  Recent Labs  11/10/14 0556 11/11/14 0431 11/12/14 0440  WBC  --  10.4  --   HGB  --  12.8*  --   PLT  --  121*  --   CREATININE 3.67* 3.74* 4.55*   Estimated Creatinine Clearance: 23.2 mL/min (by C-G formula based on Cr of 4.55).  Recent Labs  11/11/14 0431  [facility]     Microbiology: Recent Results (from the past 720 hour(s))  Blood Culture (routine x 2)     Status: None (Preliminary result)   Collection Time: 11/08/14  7:00 AM  Result Value Ref Range Status   Specimen Description BLOOD LEFT HAND  Final   Special Requests IN PEDIATRIC BOTTLE 3CC  Final   Culture   Final    NO GROWTH 3 DAYS Performed at [facility]    Report Status PENDING  Incomplete  Blood Culture (routine x 2)     Status: None (Preliminary result)   Collection Time: 11/08/14  7:15 AM  Result Value Ref Range Status   Specimen Description BLOOD RIGHT HAND  Final   Special Requests IN PEDIATRIC BOTTLE 4CC  Final   Culture   Final    NO GROWTH 3 DAYS Performed at [facility]    Report Status PENDING  Incomplete  Urine culture     Status: None   Collection Time: 11/08/14  9:14 AM  Result Value Ref Range Status   Specimen Description URINE, CATHETERIZED  Final   Special Requests NONE  Final   Culture   Final    NO GROWTH 1  DAY Performed at [facility]    Report Status 11/09/2014 FINAL  Final  MRSA PCR Screening     Status: None   Collection Time: 11/08/14 12:12 PM  Result Value Ref Range Status   MRSA by PCR NEGATIVE NEGATIVE Final    Comment:        The GeneXpert MRSA Assay (FDA approved for NASAL specimens only), is one component of a comprehensive MRSA colonization surveillance program. It is not intended to diagnose MRSA infection nor to guide or monitor treatment for MRSA infections.     Anti-infectives    Start     Dose/Rate Route Frequency Ordered Stop   11/11/14 0800  vancomycin (VANCOCIN) IVPB 1000 mg/200 mL premix  Status:  Discontinued     1,000 mg 200 mL/hr over 60 Minutes Intravenous Every 24 hours 11/11/14 0750 11/12/14 1328   11/09/14 0600  vancomycin (VANCOCIN) IVPB 1000 mg/200 mL premix  Status:  Discontinued     1,000 mg 200 mL/hr over 60 Minutes Intravenous Every 24 hours 11/08/14 0929 11/10/14 1350   11/08/14 1600  levofloxacin (LEVAQUIN) IVPB 750 mg  Status:  Discontinued     750 mg 100 mL/hr over 90 Minutes Intravenous Every 48 hours 11/08/14 1220  11/12/14 1328   11/08/14 1400  piperacillin-tazobactam (ZOSYN) IVPB 3.375 g  Status:  Discontinued     3.375 g 12.5 mL/hr over 240 Minutes Intravenous 3 times per day 11/08/14 0929 11/08/14 1219   11/08/14 1400  aztreonam (AZACTAM) 1 g in dextrose 5 % 50 mL IVPB  Status:  Discontinued     1 g 100 mL/hr over 30 Minutes Intravenous 3 times per day 11/08/14 1220 11/09/14 0806   11/08/14 0815  piperacillin-tazobactam (ZOSYN) IVPB 3.375 g     3.375 g 100 mL/hr over 30 Minutes Intravenous  Once 11/08/14 0809 11/08/14 0923   11/08/14 0815  vancomycin (VANCOCIN) IVPB 1000 mg/200 mL premix     1,000 mg 200 mL/hr over 60 Minutes Intravenous  Once 11/08/14 0809 11/08/14 0958      Assessment: 44 YOM presents with heroin overdose and rhabdomyolysis and started on IV antibiotics for sepsis. Allergies reported to pencillin and  sulfa with reaction of lip swelling and zosyn changed to aztreonam/levaquin.  Anti-infectives 9/2 >> vancomycin  >> 9/2 >> pip/tazo  >>  9/2 stopped due to updated allergy to PCN (lip swelling) 9/2 >> aztreonam >> 9/3 9/2 >> levaquin >>  Vitals/Labs Lactic acid up to 4.7, now WNL WBC WNL Tm24h: Afebrile Renal: SCr continues rising and declining UOP  Cultures 9/2 blood: NGTD 9/2 urine: NGF 9/2 MRSA PCR negative  Dose changes/levels: 9/5: 0500 VT = 14 on Vanc 1g q24h - no change 9/6: LVQ 750mg  q48h --> 500mg  IV q48h for declining renal fxn 9/7: 0800 VT = ___ on Vanc 1g q24h  Goal of Therapy:  Vancomycin trough level 15-20 mcg/ml  Eradication of infection  Plan:  Day 5 antibiotics Check vancomycin trough 9/7 due to continued decline in renal fxn.  Reduce to Levaquin 500mg  IV q48h  F/u renal fxn, cultures, clinical course  Ralene Bathe, PharmD, BCPS 11/12/2014, 1:44 PM  Pager: [phone]

## 2014-11-13 DIAGNOSIS — D696 Thrombocytopenia, unspecified: Secondary | ICD-10-CM

## 2014-11-13 DIAGNOSIS — N179 Acute kidney failure, unspecified: Secondary | ICD-10-CM

## 2014-11-13 DIAGNOSIS — T796XXD Traumatic ischemia of muscle, subsequent encounter: Secondary | ICD-10-CM

## 2014-11-13 LAB — HIV ANTIBODY (ROUTINE TESTING W REFLEX): HIV Screen 4th Generation wRfx: NONREACTIVE

## 2014-11-13 LAB — CBC
HCT: 33.2 % — ABNORMAL LOW (ref 39.0–52.0)
Hemoglobin: 12.2 g/dL — ABNORMAL LOW (ref 13.0–17.0)
MCH: 30.7 pg (ref 26.0–34.0)
MCHC: 36.7 g/dL — AB (ref 30.0–36.0)
MCV: 83.4 fL (ref 78.0–100.0)
PLATELETS: 139 10*3/uL — AB (ref 150–400)
RBC: 3.98 MIL/uL — ABNORMAL LOW (ref 4.22–5.81)
RDW: 11.5 % (ref 11.5–15.5)
WBC: 12.2 10*3/uL — ABNORMAL HIGH (ref 4.0–10.5)

## 2014-11-13 LAB — CULTURE, BLOOD (ROUTINE X 2)
CULTURE: NO GROWTH
CULTURE: NO GROWTH

## 2014-11-13 LAB — CK TOTAL AND CKMB (NOT AT ARMC)
CK TOTAL: 2667 U/L — AB (ref 49–397)
CK, MB: 5.5 ng/mL — ABNORMAL HIGH (ref 0.5–5.0)
Relative Index: 0.2 (ref 0.0–2.5)

## 2014-11-13 LAB — BASIC METABOLIC PANEL
ANION GAP: 10 (ref 5–15)
ANION GAP: 10 (ref 5–15)
BUN: 47 mg/dL — ABNORMAL HIGH (ref 6–20)
BUN: 50 mg/dL — ABNORMAL HIGH (ref 6–20)
CHLORIDE: 95 mmol/L — AB (ref 101–111)
CO2: 25 mmol/L (ref 22–32)
CO2: 29 mmol/L (ref 22–32)
Calcium: 8.3 mg/dL — ABNORMAL LOW (ref 8.9–10.3)
Calcium: 8.5 mg/dL — ABNORMAL LOW (ref 8.9–10.3)
Chloride: 93 mmol/L — ABNORMAL LOW (ref 101–111)
Creatinine, Ser: 5.06 mg/dL — ABNORMAL HIGH (ref 0.61–1.24)
Creatinine, Ser: 5.24 mg/dL — ABNORMAL HIGH (ref 0.61–1.24)
GFR calc Af Amer: 17 mL/min — ABNORMAL LOW (ref >60)
GFR calc Af Amer: 17 mL/min — ABNORMAL LOW (ref >60)
GFR, EST NON AFRICAN AMERICAN: 15 mL/min — AB (ref >60)
GFR, EST NON AFRICAN AMERICAN: 14 mL/min — AB (ref >60)
GLUCOSE: 120 mg/dL — AB (ref 65–99)
GLUCOSE: 114 mg/dL — AB (ref 65–99)
POTASSIUM: 3.7 mmol/L (ref 3.5–5.1)
POTASSIUM: 4.3 mmol/L (ref 3.5–5.1)
Sodium: 130 mmol/L — ABNORMAL LOW (ref 135–145)
Sodium: 132 mmol/L — ABNORMAL LOW (ref 135–145)

## 2014-11-13 LAB — VANCOMYCIN, TROUGH: Vancomycin Tr: 32 ug/mL (ref 10.0–20.0)

## 2014-11-13 LAB — MAGNESIUM: MAGNESIUM: 2.3 mg/dL (ref 1.7–2.4)

## 2014-11-13 MED ORDER — OXYCODONE-ACETAMINOPHEN 5-325 MG PO TABS
1.0000 | ORAL_TABLET | Freq: Four times a day (QID) | ORAL | Status: DC | PRN
  Administered 2014-11-13: 1 via ORAL
  Administered 2014-11-14 – 2014-11-16 (×10): 2 via ORAL
  Filled 2014-11-13 (×11): qty 2

## 2014-11-13 NOTE — Progress Notes (Signed)
Pt reporting intermittent sharp CP rated 6 out of 10 and occurs more often with exertion. EKG completed and shows NSR. Will page cardiology who is following. Will continue to monitor.

## 2014-11-13 NOTE — Progress Notes (Signed)
Addendum  BMP this pm shows creat 5.24. Discussed with Dr. Jonnie Finner and DC Lasix for now.  Vernell Leep, MD, FACP, FHM. [facility] Pager [phone]  If 7PM-7AM, please contact night-coverage [email or web address] Password [email or web address] 11/13/2014, 6:16 PM

## 2014-11-13 NOTE — Progress Notes (Signed)
Paged Dr. Jonnie Finner with the the results of last BMET- the pts last creatinine had gone up to 5.09 and increase from last draw and also to clarify whether or not to give the next dose of scheduled Lasix 80mg  IV q8h. MD returned the page was made aware of lab values and gave the verbal to hold next dose of Lasix. Will continue to monitor the pt.

## 2014-11-13 NOTE — Progress Notes (Signed)
ANTIBIOTIC CONSULT NOTE - FOLLOW UP  Pharmacy Consult for Vancomycin, Levofloxacin Indication: rule out sepsis  Allergies  Allergen Reactions  . Penicillins Swelling    Lips swell.    . Sulfa Antibiotics Swelling    Patient Measurements: Height: 5\' 6"  (167.6 cm) Weight: 160 lb 9.6 oz (72.848 kg) IBW/kg (Calculated) : 63.8  Vital Signs: Temp: 98.3 F (36.8 C) (09/07 1251) Temp Source: Oral (09/07 1251) BP: 155/92 mmHg (09/07 1251) Pulse Rate: 68 (09/07 1251) Intake/Output from previous day: 09/06 0701 - 09/07 0700 In: 1323.3 [P.O.:720; I.V.:503.3; IV Piggyback:100] Out: 2550 [Urine:2550] Intake/Output from this shift: Total I/O In: 480 [P.O.:480] Out: 500 [Urine:500]  Labs:  Recent Labs  11/11/14 0431 11/12/14 0440 11/13/14 0830  WBC 10.4  --  12.2*  HGB 12.8*  --  12.2*  PLT 121*  --  139*  CREATININE 3.74* 4.55* 5.06*   Estimated Creatinine Clearance: 20.8 mL/min (by C-G formula based on Cr of 5.06).  Recent Labs  11/11/14 0431 11/13/14 0830  VANCOTROUGH 14 32*     Microbiology: Recent Results (from the past 720 hour(s))  Blood Culture (routine x 2)     Status: None (Preliminary result)   Collection Time: 11/08/14  7:00 AM  Result Value Ref Range Status   Specimen Description BLOOD LEFT HAND  Final   Special Requests IN PEDIATRIC BOTTLE 3CC  Final   Culture   Final    NO GROWTH 4 DAYS Performed at [facility]    Report Status PENDING  Incomplete  Blood Culture (routine x 2)     Status: None (Preliminary result)   Collection Time: 11/08/14  7:15 AM  Result Value Ref Range Status   Specimen Description BLOOD RIGHT HAND  Final   Special Requests IN PEDIATRIC BOTTLE 4CC  Final   Culture   Final    NO GROWTH 4 DAYS Performed at [facility]    Report Status PENDING  Incomplete  Urine culture     Status: None   Collection Time: 11/08/14  9:14 AM  Result Value Ref Range Status   Specimen Description URINE, CATHETERIZED  Final   Special Requests NONE  Final   Culture   Final    NO GROWTH 1 DAY Performed at [facility]    Report Status 11/09/2014 FINAL  Final  MRSA PCR Screening     Status: None   Collection Time: 11/08/14 12:12 PM  Result Value Ref Range Status   MRSA by PCR NEGATIVE NEGATIVE Final    Comment:        The GeneXpert MRSA Assay (FDA approved for NASAL specimens only), is one component of a comprehensive MRSA colonization surveillance program. It is not intended to diagnose MRSA infection nor to guide or monitor treatment for MRSA infections.     Anti-infectives    Start     Dose/Rate Route Frequency Ordered Stop   11/12/14 1600  levofloxacin (LEVAQUIN) IVPB 500 mg     500 mg 100 mL/hr over 60 Minutes Intravenous Every 48 hours 11/12/14 1349     11/11/14 0800  vancomycin (VANCOCIN) IVPB 1000 mg/200 mL premix  Status:  Discontinued     1,000 mg 200 mL/hr over 60 Minutes Intravenous Every 24 hours 11/11/14 0750 11/12/14 1328   11/09/14 0600  vancomycin (VANCOCIN) IVPB 1000 mg/200 mL premix  Status:  Discontinued     1,000 mg 200 mL/hr over 60 Minutes Intravenous Every 24 hours 11/08/14 0929 11/10/14 1350   11/08/14  1600  levofloxacin (LEVAQUIN) IVPB 750 mg  Status:  Discontinued     750 mg 100 mL/hr over 90 Minutes Intravenous Every 48 hours 11/08/14 1220 11/12/14 1328   11/08/14 1400  piperacillin-tazobactam (ZOSYN) IVPB 3.375 g  Status:  Discontinued     3.375 g 12.5 mL/hr over 240 Minutes Intravenous 3 times per day 11/08/14 0929 11/08/14 1219   11/08/14 1400  aztreonam (AZACTAM) 1 g in dextrose 5 % 50 mL IVPB  Status:  Discontinued     1 g 100 mL/hr over 30 Minutes Intravenous 3 times per day 11/08/14 1220 11/09/14 0806   11/08/14 0815  piperacillin-tazobactam (ZOSYN) IVPB 3.375 g     3.375 g 100 mL/hr over 30 Minutes Intravenous  Once 11/08/14 0809 11/08/14 0923   11/08/14 0815  vancomycin (VANCOCIN) IVPB 1000 mg/200 mL premix     1,000 mg 200 mL/hr over 60 Minutes  Intravenous  Once 11/08/14 0809 11/08/14 5465      Assessment: Pt admitted following narcotic overdose with rhabdomylitis, AKI and possible sepsis (concern with IVDU).  Patient started on levofloxacin and Vancomycin.  Renal function continues to decline (SCr 5.06 on 9/7).  Patient afebrile in last 24h, HR 60-68, but WBC rose to 12.2.  Anti-Infectives 9/2 >> pip/tazo >> 9/2 (stopped due to updated PCN allergy) 9/2 >> aztreonam >> 9/3 9/2 >> levofloxacin >> dose reduced on 9/6 9/2 >> vancomycin >> hold dose to recheck levels  Levels 9/5 >> VT = 14 on Vanc 1g q24h 9/7 >> VT = 32 on Vanc 1g q24h 9/8 >> VR = _______  Cultures 9/2 blood: ngtd 9/2 urine: ngtd  Goal of Therapy:  Vancomycin trough level 15-20 mcg/ml  Eradication of infection  Plan: Day 6 Antibiotics --Hold Vancomycin dose on 9/7 due to high trough level --Check Random Vanc level on 9/8 @0500 , reassess dose --F/U Renal function, cultures, clinical course --Continue Levofloxacin 500 mg q48h  Viann Fish 11/13/2014,1:12 PM

## 2014-11-13 NOTE — Progress Notes (Signed)
CRITICAL VALUE ALERT  Critical value received:  Vancomycin leveo  Date of notification:  11/13/14  Time of notification:  8485  Critical value read back:Yes.    Nurse who received alert:  Janett Billow RN  MD notified (1st page):    Time of first page:    MD notified (2nd page):  Time of second page:  Responding MD:    Time MD responded:
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
## 2014-11-14 ENCOUNTER — Inpatient Hospital Stay (HOSPITAL_COMMUNITY): Payer: 59

## 2014-11-14 ENCOUNTER — Other Ambulatory Visit (HOSPITAL_COMMUNITY): Payer: 59

## 2014-11-14 DIAGNOSIS — I509 Heart failure, unspecified: Secondary | ICD-10-CM

## 2014-11-14 DIAGNOSIS — I5021 Acute systolic (congestive) heart failure: Secondary | ICD-10-CM

## 2014-11-14 LAB — RENAL FUNCTION PANEL
ANION GAP: 10 (ref 5–15)
Albumin: 2.8 g/dL — ABNORMAL LOW (ref 3.5–5.0)
BUN: 52 mg/dL — AB (ref 6–20)
CHLORIDE: 97 mmol/L — AB (ref 101–111)
CO2: 26 mmol/L (ref 22–32)
Calcium: 8.3 mg/dL — ABNORMAL LOW (ref 8.9–10.3)
Creatinine, Ser: 5.12 mg/dL — ABNORMAL HIGH (ref 0.61–1.24)
GFR calc Af Amer: 17 mL/min — ABNORMAL LOW (ref >60)
GFR, EST NON AFRICAN AMERICAN: 15 mL/min — AB (ref >60)
GLUCOSE: 104 mg/dL — AB (ref 65–99)
PHOSPHORUS: 5.8 mg/dL — AB (ref 2.5–4.6)
POTASSIUM: 3.8 mmol/L (ref 3.5–5.1)
Sodium: 133 mmol/L — ABNORMAL LOW (ref 135–145)

## 2014-11-14 LAB — HEPATIC FUNCTION PANEL
ALK PHOS: 45 U/L (ref 38–126)
ALT: 101 U/L — AB (ref 17–63)
AST: 87 U/L — AB (ref 15–41)
Albumin: 2.8 g/dL — ABNORMAL LOW (ref 3.5–5.0)
BILIRUBIN INDIRECT: 0.8 mg/dL (ref 0.3–0.9)
Bilirubin, Direct: 0.1 mg/dL (ref 0.1–0.5)
TOTAL PROTEIN: 5.6 g/dL — AB (ref 6.5–8.1)
Total Bilirubin: 0.9 mg/dL (ref 0.3–1.2)

## 2014-11-14 LAB — CBC
HEMATOCRIT: 33.4 % — AB (ref 39.0–52.0)
HEMOGLOBIN: 12 g/dL — AB (ref 13.0–17.0)
MCH: 29.9 pg (ref 26.0–34.0)
MCHC: 35.9 g/dL (ref 30.0–36.0)
MCV: 83.1 fL (ref 78.0–100.0)
Platelets: 153 10*3/uL (ref 150–400)
RBC: 4.02 MIL/uL — ABNORMAL LOW (ref 4.22–5.81)
RDW: 11.6 % (ref 11.5–15.5)
WBC: 11.3 10*3/uL — ABNORMAL HIGH (ref 4.0–10.5)

## 2014-11-14 LAB — CK: Total CK: 1244 U/L — ABNORMAL HIGH (ref 49–397)

## 2014-11-14 NOTE — Progress Notes (Signed)
  Echocardiogram 2D Echocardiogram limited has been performed.  Tresa Res 11/14/2014, 9:43 AM

## 2014-11-14 NOTE — Progress Notes (Signed)
OT Cancellation Note  Patient Details Name: Erik [name] MRN: [id number] DOB: [birth year]   Cancelled Treatment:    Reason Eval/Treat Not Completed: Fatigue/lethargy limiting ability to participate Pt refused OT this day due to fatigue.  Betsy Pries 11/14/2014, 11:51 AM
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
## 2014-11-14 NOTE — Progress Notes (Signed)
PT Cancellation Note  Patient Details Name: Erik [name] MRN: [id number] DOB: 1996/01/27   Cancelled Treatment:    Reason Eval/Treat Not Completed: Patient declined, no reason specified "I don't feel like walking right now." Will check back another day. Thanks.    Weston Anna, MPT Pager: [phone]
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
## 2014-11-14 NOTE — Progress Notes (Signed)
  [facility] Progress Note   Subjective: UOP better, creat down today 5.1  Filed Vitals:   11/13/14 2059 11/14/14 0500 11/14/14 0606 11/14/14 1327  BP: 151/95  158/91 152/90  Pulse: 70  68 66  Temp: 98.4 F (36.9 C)  98.8 F (37.1 C) 97.9 F (36.6 C)  TempSrc: Oral  Oral Oral  Resp: 16  16 18   Height:      Weight:  72.5 kg (159 lb 13.3 oz)    SpO2: 98%  96% 98%   Exam: Alert, no distress +JVD Chest is clear bilat no rales or wheezes RRR no MRG Abd soft, slightly tense, nontender, +bs, no ascites GU normal male LLE 2+ edema to thigh RLE 1+ edema Mild nonpitting UE edema bilat Neuro is alert , nf O x 3  UA > 300 prot, 11-20 rbc, 0-2 wbc, large Hb, turbid, +bact, amber CXR 9/2 > minimal atx ECHO LV 35-40%   Assessment: 1 Acute kidney injury - nonoliguric ATN from rhabdo/ shock. STarting to recover, creat down today first time. Continue no diuretics, let diurese spontaneously.  2 IVDA 3 Shock, resolved 4 Acute rhabdomyolysis, from pressure 5 Vol excess up [place] - check creat am    Kelly Splinter MD  pager [phone]    cell [phone]  11/14/2014, 2:47 PM     Recent Labs Lab 11/09/14 0450  11/12/14 0440 11/13/14 0830 11/13/14 1600 11/14/14 0446  NA 134*  < > 129* 130* 132* 133*  K 3.3*  < > 3.3* 3.7 4.3 3.8  CL 99*  < > 95* 95* 93* 97*  CO2 26  < > 26 25 29 26   GLUCOSE 131*  < > 111* 120* 114* 104*  BUN 35*  < > 40* 47* 50* 52*  CREATININE 2.99*  < > 4.55* 5.06* 5.24* 5.12*  CALCIUM 6.3*  < > 7.8* 8.3* 8.5* 8.3*  PHOS 3.6  --  3.8  --   --  5.8*  < > = values in this interval not displayed.  Recent Labs Lab 11/10/14 0556 11/11/14 0431 11/12/14 0440 11/14/14 0446  AST 545* 343*  --  87*  ALT 229* 195*  --  101*  ALKPHOS 50 53  --  45  BILITOT 0.7 0.9  --  0.9  PROT 5.2* 5.7*  --  5.6*  ALBUMIN 3.0* 3.0* 2.9* 2.8*  2.8*    Recent Labs Lab 11/08/14 0652  11/09/14 0830 11/11/14 0431 11/13/14 0830 11/14/14 0446  WBC  31.4*  < > 14.3* 10.4 12.2* 11.3*  NEUTROABS 26.7*  --  11.0* 7.8*  --   --   HGB 16.0  < > 12.8* 12.8* 12.2* 12.0*  HCT 47.0  < > 36.4* 36.1* 33.2* 33.4*  MCV 89.5  < > 86.1 84.0 83.4 83.1  PLT PLATELET CLUMPS NOTED ON SMEAR, COUNT APPEARS ADEQUATE  < > 131* 121* 139* 153  < > = values in this interval not displayed. . heparin  5,000 Units Subcutaneous 3 times per day  . lidocaine  1 patch Transdermal Q24H  . nicotine  7 mg Transdermal Daily   . sodium chloride 40 mL/hr at 11/14/14 1235   diphenhydrAMINE, HYDROmorphone (DILAUDID) injection, ondansetron (ZOFRAN) IV, oxyCODONE-acetaminophen

## 2014-11-14 NOTE — Progress Notes (Signed)
Echo shows complete normalization of LV systolic function and still no signs suspicious for endocarditis. Transient LV dysfunction due to hypotension/shock, resolved. No further cardiac workup is recommended.  Sanda Klein, MD, [facility] [phone] office [phone] pager

## 2014-11-14 NOTE — Progress Notes (Signed)
  [facility] Progress Note   Subjective: UOP good  Filed Vitals:   11/13/14 2059 11/14/14 0500 11/14/14 0606 11/14/14 1327  BP: 151/95  158/91 152/90  Pulse: 70  68 66  Temp: 98.4 F (36.9 C)  98.8 F (37.1 C) 97.9 F (36.6 C)  TempSrc: Oral  Oral Oral  Resp: 16  16 18   Height:      Weight:  72.5 kg (159 lb 13.3 oz)    SpO2: 98%  96% 98%   Exam: Alert, no distress +JVD Chest is clear bilat no rales or wheezes RRR no MRG Abd soft, slightly tense, nontender, +bs, no ascites GU normal male LLE 2+ edema to thigh RLE 1+ edema Mild nonpitting UE edema bilat Neuro is alert , nf O x 3  UA > 300 prot, 11-20 rbc, 0-2 wbc, large Hb, turbid, +bact, amber CXR 9/2 > minimal atx ECHO LV 35-40%   Assessment: 1 Acute kidney injury - nonoliguric ATN from rhabdo/ shock; creat up to 5.0 today, less of a climb, may be leveling off 2 IVDA 3 Shock, resolved 4 Acute rhabdomyolysis, from pressure 5 Vol excess up 15kg   Plan - stop lasix, check labs tonight and in am    Kelly Splinter MD  pager [phone]    cell [phone]  11/14/2014, 2:46 PM     Recent Labs Lab 11/09/14 0450  11/12/14 0440 11/13/14 0830 11/13/14 1600 11/14/14 0446  NA 134*  < > 129* 130* 132* 133*  K 3.3*  < > 3.3* 3.7 4.3 3.8  CL 99*  < > 95* 95* 93* 97*  CO2 26  < > 26 25 29 26   GLUCOSE 131*  < > 111* 120* 114* 104*  BUN 35*  < > 40* 47* 50* 52*  CREATININE 2.99*  < > 4.55* 5.06* 5.24* 5.12*  CALCIUM 6.3*  < > 7.8* 8.3* 8.5* 8.3*  PHOS 3.6  --  3.8  --   --  5.8*  < > = values in this interval not displayed.  Recent Labs Lab 11/10/14 0556 11/11/14 0431 11/12/14 0440 11/14/14 0446  AST 545* 343*  --  87*  ALT 229* 195*  --  101*  ALKPHOS 50 53  --  45  BILITOT 0.7 0.9  --  0.9  PROT 5.2* 5.7*  --  5.6*  ALBUMIN 3.0* 3.0* 2.9* 2.8*  2.8*    Recent Labs Lab 11/08/14 0652  11/09/14 0830 11/11/14 0431 11/13/14 0830 11/14/14 0446  WBC 31.4*  < > 14.3* 10.4 12.2* 11.3*   NEUTROABS 26.7*  --  11.0* 7.8*  --   --   HGB 16.0  < > 12.8* 12.8* 12.2* 12.0*  HCT 47.0  < > 36.4* 36.1* 33.2* 33.4*  MCV 89.5  < > 86.1 84.0 83.4 83.1  PLT PLATELET CLUMPS NOTED ON SMEAR, COUNT APPEARS ADEQUATE  < > 131* 121* 139* 153  < > = values in this interval not displayed. . heparin  5,000 Units Subcutaneous 3 times per day  . lidocaine  1 patch Transdermal Q24H  . nicotine  7 mg Transdermal Daily   . sodium chloride 40 mL/hr at 11/14/14 1235   diphenhydrAMINE, HYDROmorphone (DILAUDID) injection, ondansetron (ZOFRAN) IV, oxyCODONE-acetaminophen

## 2014-11-14 NOTE — Progress Notes (Signed)
    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   Discussed with Dr. Sallyanne Kuster, will obtain echo on Thursday and see on Friday, expect improvement in EF. Currently patient is struggling with acute kidney injury with nonoliguric ATN from rhabdo and shock. Shock resolved. Cardiology will followup from a distance.   ECHO has been obtained. Awaiting formal read.     Angelena Form PA-C  [facility]

## 2014-11-15 LAB — RENAL FUNCTION PANEL
ALBUMIN: 2.7 g/dL — AB (ref 3.5–5.0)
ANION GAP: 12 (ref 5–15)
BUN: 53 mg/dL — ABNORMAL HIGH (ref 6–20)
CALCIUM: 8.5 mg/dL — AB (ref 8.9–10.3)
CO2: 24 mmol/L (ref 22–32)
Chloride: 102 mmol/L (ref 101–111)
Creatinine, Ser: 4.41 mg/dL — ABNORMAL HIGH (ref 0.61–1.24)
GFR calc non Af Amer: 18 mL/min — ABNORMAL LOW (ref >60)
GFR, EST AFRICAN AMERICAN: 21 mL/min — AB (ref >60)
Glucose, Bld: 106 mg/dL — ABNORMAL HIGH (ref 65–99)
PHOSPHORUS: 6.3 mg/dL — AB (ref 2.5–4.6)
POTASSIUM: 4.1 mmol/L (ref 3.5–5.1)
SODIUM: 138 mmol/L (ref 135–145)

## 2014-11-15 NOTE — Progress Notes (Signed)
  [facility] Progress Note   Subjective: UOP better, creat down to 4.4 today, in better spirits  Filed Vitals:   11/14/14 1327 11/14/14 2110 11/15/14 0435 11/15/14 1300  BP: 152/90 138/83 148/93 155/99  Pulse: 66 64 58 64  Temp: 97.9 F (36.6 C) 98.7 F (37.1 C) 98.9 F (37.2 C) 98.7 F (37.1 C)  TempSrc: Oral Oral Oral Oral  Resp: 18 16 20 18   Height:      Weight:   71.079 kg (156 lb 11.2 oz)   SpO2: 98% 98% 100% 100%   Exam: Alert, no distress +JVD Chest is clear bilat no rales or wheezes RRR no MRG Abd soft, slightly tense, nontender, +bs, no ascites GU normal male Neuro is alert , nf O x 3  UA > 300 prot, 11-20 rbc, 0-2 wbc, large Hb, turbid, +bact, amber CXR 9/2 > minimal atx ECHO LV 35-40%   Assessment/Plan: 1 Acute kidney injury - nonoliguric ATN from rhabdo/ shock. Continuing to improve, have d/w primary MD need for close f/u after discharge w PCP. No other suggestions, avoid nsaid's for 1-2 months. Will sign off.  2 IVDA 3 Shock, resolved 4 Acute rhabdomyolysis, from pressure 5 Vol excess up 15kg     Kelly Splinter MD  pager [phone]    cell [phone]  11/15/2014, 7:17 PM     Recent Labs Lab 11/12/14 0440  11/13/14 1600 11/14/14 0446 11/15/14 0429  NA 129*  < > 132* 133* 138  K 3.3*  < > 4.3 3.8 4.1  CL 95*  < > 93* 97* 102  CO2 26  < > 29 26 24   GLUCOSE 111*  < > 114* 104* 106*  BUN 40*  < > 50* 52* 53*  CREATININE 4.55*  < > 5.24* 5.12* 4.41*  CALCIUM 7.8*  < > 8.5* 8.3* 8.5*  PHOS 3.8  --   --  5.8* 6.3*  < > = values in this interval not displayed.  Recent Labs Lab 11/10/14 0556 11/11/14 0431 11/12/14 0440 11/14/14 0446 11/15/14 0429  AST 545* 343*  --  87*  --   ALT 229* 195*  --  101*  --   ALKPHOS 50 53  --  45  --   BILITOT 0.7 0.9  --  0.9  --   PROT 5.2* 5.7*  --  5.6*  --   ALBUMIN 3.0* 3.0* 2.9* 2.8*  2.8* 2.7*    Recent Labs Lab 11/09/14 0830 11/11/14 0431 11/13/14 0830 11/14/14 0446  WBC 14.3*  10.4 12.2* 11.3*  NEUTROABS 11.0* 7.8*  --   --   HGB 12.8* 12.8* 12.2* 12.0*  HCT 36.4* 36.1* 33.2* 33.4*  MCV 86.1 84.0 83.4 83.1  PLT 131* 121* 139* 153   . heparin  5,000 Units Subcutaneous 3 times per day  . lidocaine  1 patch Transdermal Q24H  . nicotine  7 mg Transdermal Daily   . sodium chloride 40 mL/hr at 11/15/14 1015   diphenhydrAMINE, HYDROmorphone (DILAUDID) injection, ondansetron (ZOFRAN) IV, oxyCODONE-acetaminophen

## 2014-11-15 NOTE — Progress Notes (Signed)
Physical Therapy Treatment Patient Details Name: Erik [name] MRN: [id number] DOB: Feb 07, 1996 Today's Date: 11/15/2014    History of Present Illness 19 y.o. male found down in his apartment, admitted to Heroin use. Sats 84% on RA on admission.  Narcan administered.  Dx of septic shock, LLE edema (myositis?), rhabdo, respiratory failure, AKI.     PT Comments    Pt looking better and less swollen.  Pt stated he has been amb in hallway with family but still needs the walker.  Stated he tried to amb without and he felt too unsteady.  Assisted OOB to amb a great distance with RW.  Tolerated well.  Mild c/o L thigh pain but "its getting better".   Will update LPT as pt has reached maximal mobility level and getting self around his room without assist.   Next session address stairs then poss sign off.  Follow Up Recommendations  No PT follow up     Equipment Recommendations       Recommendations for Other Services       Precautions / Restrictions Precautions Precautions: Fall Restrictions Weight Bearing Restrictions: No    Mobility  Bed Mobility Overal bed mobility: Independent Bed Mobility: Supine to Sit;Sit to Supine     Supine to sit: Independent Sit to supine: Independent      Transfers Overall transfer level: Modified independent Equipment used: Rolling walker (2 wheeled) Transfers: Sit to/from Stand Sit to Stand: Modified independent (Device/Increase time)            Ambulation/Gait Ambulation/Gait assistance: Supervision Ambulation Distance (Feet): 750 Feet Assistive device: Rolling walker (2 wheeled)       General Gait Details: pt stated he tried amb without walker and he felt to unsteady.  can amb in his room without but holds to bed/doorway to steady self.  Pt stated he is also amb with family in hallway. Will update LPT as pt has met maximal mobility level for this setting.    Stairs            Wheelchair Mobility    Modified Rankin (Stroke  Patients Only)       Balance                                    Cognition Arousal/Alertness: Awake/alert Behavior During Therapy: WFL for tasks assessed/performed Overall Cognitive Status: Within Functional Limits for tasks assessed                      Exercises      General Comments        Pertinent Vitals/Pain Pain Assessment: 0-10 Pain Score: 3  Pain Location: L thigh "tender"   "tight" Pain Descriptors / Indicators: Tender;Tightness Pain Intervention(s): Monitored during session;Repositioned    Home Living                      Prior Function            PT Goals (current goals can now be found in the care plan section) Progress towards PT goals: Progressing toward goals    Frequency  Min 3X/week    PT Plan      Co-evaluation             End of Session Equipment Utilized During Treatment: Gait belt Activity Tolerance: Patient tolerated treatment well Patient left: in bed;with call bell/phone within reach;with family/visitor present  Time: 1355-1410 PT Time Calculation (min) (ACUTE ONLY): 15 min  Charges:  $Gait Training: 8-22 mins                    G Codes:      Erik [name]  PTA [facility]  Acute  Rehab Pager      [phone]

## 2014-11-15 NOTE — Progress Notes (Signed)
Occupational Therapy Treatment and Discharge Patient Details Name: Erik [name] MRN: [id number] DOB: [birth year] Today's Date: 11/15/2014    History of present illness 19 y.o. male found down in his apartment, admitted to Heroin use. Sats 84% on RA on admission.  Narcan administered.  Dx of septic shock, LLE edema (myositis?), rhabdo, respiratory failure, AKI.    OT comments  This 19 yo male admitted with above presents to acute OT at a S level overall with being very close to a Mod I level and should reach this without further OT intervention. Acute OT will sign off.  Follow Up Recommendations  No OT follow up    Equipment Recommendations  None recommended by OT       Precautions / Restrictions Precautions Precautions: Fall Restrictions Weight Bearing Restrictions: No       Mobility Bed Mobility Overal bed mobility: Independent Bed Mobility: Supine to Sit;Sit to Supine     Supine to sit: Independent Sit to supine: Independent      Transfers Overall transfer level: Needs assistance Equipment used: Rolling walker (2 wheeled) Transfers: Sit to/from Stand Sit to Stand: Modified independent (Device/Increase time)                  ADL Overall ADL's : Needs assistance/impaired                                       General ADL Comments: overall pt is at a S/Mod I level due to weakness in his Bil LES (left more so than right). We problem solved an easier way for him to get his left sock on, we talked about it being safer if he sits on seat that he has in his walk in shower at home. He feels maybe his back to about 50% of normal for mobillity and was asking how much longer he would need to use the RW (he agreed to walk around the unit with me and the last 20 feet I had him walk without RW and he saw how much more pronounced his weakness is in his RLE without it (his limp greatly increased as he completed the 20 feet). He asked about being able to play sports  (specifically tennis) and I told him that there is always next session that he is just learning to walk all over again.                 Cognition   Behavior During Therapy: WFL for tasks assessed/performed Overall Cognitive Status: Within Functional Limits for tasks assessed                                    Pertinent Vitals/ Pain       Pain Assessment: 0-10 Pain Score: 2  Pain Location: Bil LE tightness (left worse than right) Pain Descriptors / Indicators: Tightness Pain Intervention(s): Monitored during session         Frequency Min 2X/week     Progress Toward Goals  OT Goals(current goals can now be found in the care plan section)  Progress towards OT goals:  (Pt very close to being Mod I )     Plan Discharge plan remains appropriate       End of Session Equipment Utilized During Treatment: Rolling walker   Activity Tolerance Patient tolerated treatment well  Patient Left in bed;with call bell/phone within reach;with nursing/sitter in room   Nurse Communication          Time: 4827-0786 OT Time Calculation (min): 24 min  Charges: OT General Charges $OT Visit: 1 Procedure OT Treatments $Self Care/Home Management : 23-37 mins  Almon Register  [phone]  11/15/2014, 2:56 PM
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
## 2014-11-16 DIAGNOSIS — J9601 Acute respiratory failure with hypoxia: Secondary | ICD-10-CM

## 2014-11-16 LAB — COMPREHENSIVE METABOLIC PANEL
ALT: 70 U/L — ABNORMAL HIGH (ref 17–63)
AST: 45 U/L — AB (ref 15–41)
Albumin: 2.8 g/dL — ABNORMAL LOW (ref 3.5–5.0)
Alkaline Phosphatase: 41 U/L (ref 38–126)
Anion gap: 7 (ref 5–15)
BILIRUBIN TOTAL: 0.6 mg/dL (ref 0.3–1.2)
BUN: 43 mg/dL — AB (ref 6–20)
CHLORIDE: 104 mmol/L (ref 101–111)
CO2: 29 mmol/L (ref 22–32)
CREATININE: 3.4 mg/dL — AB (ref 0.61–1.24)
Calcium: 8.6 mg/dL — ABNORMAL LOW (ref 8.9–10.3)
GFR, EST AFRICAN AMERICAN: 28 mL/min — AB (ref >60)
GFR, EST NON AFRICAN AMERICAN: 24 mL/min — AB (ref >60)
Glucose, Bld: 105 mg/dL — ABNORMAL HIGH (ref 65–99)
POTASSIUM: 4.6 mmol/L (ref 3.5–5.1)
Sodium: 140 mmol/L (ref 135–145)
TOTAL PROTEIN: 5.7 g/dL — AB (ref 6.5–8.1)

## 2014-11-16 LAB — CK: CK TOTAL: 398 U/L — AB (ref 49–397)

## 2014-11-16 MED ORDER — NICOTINE 7 MG/24HR TD PT24: 7.0000 mg | MEDICATED_PATCH | Freq: Every day | TRANSDERMAL | Status: DC

## 2014-11-16 MED ORDER — OXYCODONE-ACETAMINOPHEN 5-325 MG PO TABS: 1.0000 | ORAL_TABLET | Freq: Four times a day (QID) | ORAL | Status: DC | PRN
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
## 2014-11-16 NOTE — Care Management Note (Signed)
Case Management Note  Patient Details  Name: Derril Franek MRN: [id number] Date of Birth: 1995/11/14  Subjective/Objective:          Respiratory failure          Action/Plan: Home  Expected Discharge Date:  11/16/2014               Expected Discharge Plan:  Home/Self Care  In-House Referral:     Discharge planning Services  CM Consult  Post Acute Care Choice:    Choice offered to:     DME Arranged:  [facility] rolling DME Agency:  [place].   Status of Service:  Completed, signed off  Medicare Important Message Given:    Date Medicare IM Given:    Medicare IM give by:    Date Additional Medicare IM Given:    Additional Medicare Important Message give by:     If discussed at [place] of Stay Meetings, dates discussed:    Additional Comments: Contacted [facility] for RW for home.  Erenest Rasher, RN 11/16/2014, 2:13 PM

## 2014-11-18 ENCOUNTER — Encounter (HOSPITAL_COMMUNITY): Payer: Self-pay | Admitting: *Deleted

## 2014-11-19 ENCOUNTER — Encounter: Payer: Self-pay | Admitting: Internal Medicine

## 2014-11-19 ENCOUNTER — Other Ambulatory Visit (INDEPENDENT_AMBULATORY_CARE_PROVIDER_SITE_OTHER): Payer: 59

## 2014-11-19 ENCOUNTER — Ambulatory Visit (INDEPENDENT_AMBULATORY_CARE_PROVIDER_SITE_OTHER): Payer: 59 | Admitting: Internal Medicine

## 2014-11-19 VITALS — BP 128/70 | HR 59 | Temp 98.5°F | Ht 66.0 in | Wt 142.5 lb

## 2014-11-19 DIAGNOSIS — M79605 Pain in left leg: Secondary | ICD-10-CM

## 2014-11-19 DIAGNOSIS — T796XXD Traumatic ischemia of muscle, subsequent encounter: Secondary | ICD-10-CM | POA: Diagnosis not present

## 2014-11-19 DIAGNOSIS — B181 Chronic viral hepatitis B without delta-agent: Secondary | ICD-10-CM

## 2014-11-19 DIAGNOSIS — F1990 Other psychoactive substance use, unspecified, uncomplicated: Secondary | ICD-10-CM | POA: Diagnosis not present

## 2014-11-19 LAB — BASIC METABOLIC PANEL
BUN: 20 mg/dL (ref 6–23)
CO2: 27 mEq/L (ref 19–32)
Calcium: 9.4 mg/dL (ref 8.4–10.5)
Chloride: 105 mEq/L (ref 96–112)
Creatinine, Ser: 1.32 mg/dL (ref 0.40–1.50)
GFR: 72.71 mL/min (ref >60.00)
GLUCOSE: 76 mg/dL (ref 70–99)
POTASSIUM: 4.7 meq/L (ref 3.5–5.1)
SODIUM: 141 meq/L (ref 135–145)

## 2014-11-19 LAB — CBC WITH DIFFERENTIAL/PLATELET
BASOS ABS: 0 10*3/uL (ref 0.0–0.1)
Basophils Relative: 0.4 % (ref 0.0–3.0)
EOS PCT: 1.5 % (ref 0.0–5.0)
Eosinophils Absolute: 0.2 10*3/uL (ref 0.0–0.7)
HCT: 37.9 % — ABNORMAL LOW (ref 39.0–52.0)
HEMOGLOBIN: 12.8 g/dL — AB (ref 13.0–17.0)
LYMPHS PCT: 18.2 % (ref 12.0–46.0)
Lymphs Abs: 2.4 10*3/uL (ref 0.7–4.0)
MCHC: 33.8 g/dL (ref 30.0–36.0)
MCV: 87.4 fl (ref 78.0–100.0)
Monocytes Absolute: 0.9 10*3/uL (ref 0.1–1.0)
Monocytes Relative: 6.5 % (ref 3.0–12.0)
Neutro Abs: 9.7 10*3/uL — ABNORMAL HIGH (ref 1.4–7.7)
Neutrophils Relative %: 73.4 % (ref 43.0–77.0)
Platelets: 362 10*3/uL (ref 150.0–400.0)
RBC: 4.33 Mil/uL (ref 4.22–5.81)
RDW: 12.1 % (ref 11.5–15.5)
WBC: 13.2 10*3/uL — ABNORMAL HIGH (ref 4.0–10.5)

## 2014-11-19 LAB — HEPATIC FUNCTION PANEL
ALBUMIN: 3.9 g/dL (ref 3.5–5.2)
ALK PHOS: 54 U/L (ref 39–117)
ALT: 64 U/L — AB (ref 0–53)
AST: 37 U/L (ref 0–37)
Bilirubin, Direct: 0.1 mg/dL (ref 0.0–0.3)
Total Bilirubin: 0.3 mg/dL (ref 0.2–1.2)
Total Protein: 7.5 g/dL (ref 6.0–8.3)

## 2014-11-19 NOTE — Assessment & Plan Note (Signed)
Hospitalized September 2016 with acute respiratory failure and acute renal failure related to rhabdomyolysis secondary to trauma in the setting of drug overdose. Patient has been auto diuresing in the past 72 hours since discharge home. Recheck creatinine today and continue hydration. We'll monitor weekly until renal function normalized

## 2014-11-19 NOTE — Progress Notes (Signed)
Subjective:    Patient ID: Erik [name], male    DOB: [birth year], 19 y.o.   MRN: [id number]  HPI  Patient here for hospital follow up - [facility] with acute respiratory failure and acute renal failure related to IV drug overdose. Renal function improved and mobilization improved prior to discharge. Reports since home has been auto diuresing with frequent urination. Continued left leg pain in posterior thigh and anterior shin related to prior injury, but swelling gradually improved over past 72 hours.  Past Medical History  Diagnosis Date  . Sinusitis   . Anxiety   . Hepatitis B   . Drug abuse and dependence     admit with OD 11/2014: rhabdo and AKI, ARF -    Family History  Problem Relation Age of Onset  . Adopted: Yes   Social History  Substance Use Topics  . Smoking status: Current Some Day Smoker  . Smokeless tobacco: None  . Alcohol Use: Yes    Review of Systems  Constitutional: Negative for fever, activity change, appetite change, fatigue and unexpected weight change.  Respiratory: Negative for cough, chest tightness, shortness of breath and wheezing.   Cardiovascular: Positive for leg swelling (LLE, slowly improving). Negative for chest pain and palpitations.  Gastrointestinal: Negative for nausea, vomiting, abdominal pain and constipation.  Endocrine: Positive for polyuria (x 72 h).  Musculoskeletal: Positive for arthralgias and gait problem (pain in L thigh and anterior shin from prior injury).  Neurological: Negative for dizziness, weakness and headaches.  Psychiatric/Behavioral: Negative for dysphoric mood. The patient is not nervous/anxious.   All other systems reviewed and are negative.      Objective:    Physical Exam  Constitutional: He is oriented to person, place, and time. He appears well-developed and well-nourished. No distress.  Cardiovascular: Normal rate, regular rhythm and normal heart sounds.   No murmur  heard. Pulmonary/Chest: Effort normal and breath sounds normal. No respiratory distress.  Musculoskeletal: Normal range of motion. He exhibits edema and tenderness.  Soft tissue swelling of left hamstring. Also left anterior compartment with swelling but no evidence of compartment syndrome. Full range of motion ankle and feet including toes. Normal cap refill and good sensation distally left leg  Neurological: He is alert and oriented to person, place, and time. Coordination (due to left lower extremity pain causing atalgic gait) abnormal.  Skin: No rash noted. No erythema. No pallor.  Psychiatric: He has a normal mood and affect. His behavior is normal. Judgment and thought content normal.    BP 128/70 mmHg  Pulse 59  Temp(Src) 98.5 F (36.9 C) (Oral)  Ht 5\' 6"  (1.676 m)  Wt 142 lb 8 oz (64.638 kg)  BMI 23.01 kg/m2  SpO2 98% Wt Readings from Last 3 Encounters:  11/19/14 142 lb 8 oz (64.638 kg)  11/16/14 156 lb 12 oz (71.1 kg)  11/08/14 148 lb (67.132 kg)    Lab Results  Component Value Date   WBC 11.3* 11/14/2014   HGB 12.0* 11/14/2014   HCT 33.4* 11/14/2014   PLT 153 11/14/2014   GLUCOSE 105* 11/16/2014   ALT 70* 11/16/2014   AST 45* 11/16/2014   NA 140 11/16/2014   K 4.6 11/16/2014   CL 104 11/16/2014   CREATININE 3.40* 11/16/2014   BUN 43* 11/16/2014   CO2 29 11/16/2014   INR 1.41 11/08/2014    Mr Tibia Fibula Left Wo Contrast  11/08/2014   CLINICAL DATA:  Acute left leg pain.  EXAM: MRI OF LOWER LEFT EXTREMITY WITHOUT CONTRAST  TECHNIQUE: Multiplanar, multisequence MR imaging of the left lower leg was performed. No intravenous contrast was administered.  COMPARISON:  None.  FINDINGS: There is no marrow signal abnormality. There is no fracture or dislocation. There is no periosteal reaction.  There is no fluid collection or hematoma. There is nonspecific edema within the left peroneus brevis and peroneus longus muscles. There is a small amount of perifascial fluid. The  remainder of the musculature of the left lower leg and the right lower leg are normal.  IMPRESSION: 1. Nonspecific edema within the left peroneus brevis and peroneus longus muscles. This appearance can be seen with myositis secondary to an infectious or inflammatory process versus infarction versus muscle strain. Infarct versus myositis would be difficult to delineate in the absence of intravenous contrast.   Electronically Signed   By: Kathreen Devoid   On: 11/08/2014 21:11   Dg Chest Port 1 View  11/08/2014   CLINICAL DATA:  Acute respiratory failure with hypoxemia - sepsis - rising lactic acid level  EXAM: PORTABLE CHEST - 1 VIEW  COMPARISON:  11/08/2014  FINDINGS: Left subclavian central line tip overlies the level of superior vena cava and appears unchanged. Heart is normal in size. Mild perihilar atelectasis.  IMPRESSION: Minimal perihilar atelectasis.   Electronically Signed   By: Nolon Nations M.D.   On: 11/08/2014 19:25   Dg Chest Port 1 View  11/08/2014   CLINICAL DATA:  Central catheter placement  EXAM: PORTABLE CHEST - 1 VIEW  COMPARISON:  Study obtained earlier in the day  FINDINGS: Central catheter tip is in the superior vena cava just above the cavoatrial junction. No pneumothorax. Lungs clear. Heart is upper normal in size with pulmonary vascularity within normal limits. No adenopathy. No bone lesions.  IMPRESSION: Central catheter tip in superior vena cava just above cavoatrial junction. No pneumothorax. Lungs clear.   Electronically Signed   By: Lowella Grip III M.D.   On: 11/08/2014 09:41   Dg Chest Port 1 View  11/08/2014   CLINICAL DATA:  Found on floor with drugs.  Hypotensive.  Hypoxic.  EXAM: PORTABLE CHEST - 1 VIEW  COMPARISON:  None.  FINDINGS: A single AP portable view of the chest demonstrates no focal airspace consolidation or alveolar edema. The lungs are grossly clear. There is no large effusion or pneumothorax. Cardiac and mediastinal contours appear unremarkable.   IMPRESSION: No active disease.   Electronically Signed   By: Andreas Newport M.D.   On: 11/08/2014 06:53       Assessment & Plan:   Problem List Items Addressed This Visit    IVDU (intravenous drug user)    Patient aware and engage with ongoing rehabilitation needs. Patient will reach out if other medical help desired for this problem      Left leg pain    Significant rhabdomyolysis from damage related to left hamstrings value acute injury in the setting of drug overdose 11/07/2014. Tightness of left hamstring is slowly improving as is left anterior shin. Encouraged continued mobilization, hydration and activity as tolerated. Tylenol as needed for pain okay      Relevant Orders   Basic metabolic panel   CBC with Differential/Platelet   Hepatic function panel   Traumatic rhabdomyolysis - Primary    Hospitalized September 2016 with acute respiratory failure and acute renal failure related to rhabdomyolysis secondary to trauma in the setting of drug overdose. Patient has been auto diuresing in the  past 72 hours since discharge home. Recheck creatinine today and continue hydration. We'll monitor weekly until renal function normalized      Relevant Orders   Basic metabolic panel   CBC with Differential/Platelet   Hepatic function panel   Viral hepatitis B chronic    Chronic disease, asymptomatic until acute liver injury in the setting of traumatic rhabdomyolysis. Recheck LFTs today. See prior records for baseline value related to chronic viral disease      Relevant Orders   Basic metabolic panel   CBC with Differential/Platelet   Hepatic function panel     Work note provided today to return to full activity without restriction on Sunday, September 25. If patient encounters problems with this return date, or feels ready to return prior to this time, he will contact this office to readjust date as needed.  Time spent with pt today 40 minutes, greater than 50% time spent counseling  patient on hospitalization for acute renal injury from rhabdomyolysis, trauma related to IV drug overdose and hospitalization with same and medication review. Also review of prior hospital records including discharge summary   Gwendolyn Grant, MD

## 2014-11-19 NOTE — Assessment & Plan Note (Signed)
Significant rhabdomyolysis from damage related to left hamstrings value acute injury in the setting of drug overdose 11/07/2014. Tightness of left hamstring is slowly improving as is left anterior shin. Encouraged continued mobilization, hydration and activity as tolerated. Tylenol as needed for pain okay

## 2014-11-19 NOTE — Progress Notes (Signed)
Pre visit review using our clinic review tool, if applicable. No additional management support is needed unless otherwise documented below in the visit note. 

## 2014-11-19 NOTE — Patient Instructions (Signed)
It was good to see you today.  We have reviewed your prior records including labs and tests today  Test(s) ordered today. Your results will be released to [facility] (or called to you) after review, usually within 72hours after test completion. If any changes need to be made, you will be notified at that same time.  Medications reviewed and updated, no changes recommended at this time. Only Tylenol as needed at this time  We will plan to recheck your labs once every week until normalized, or until new baseline has been proven   Please schedule followup in 6-12 months, call sooner if problems.

## 2014-11-19 NOTE — Assessment & Plan Note (Signed)
Chronic disease, asymptomatic until acute liver injury in the setting of traumatic rhabdomyolysis. Recheck LFTs today. See prior records for baseline value related to chronic viral disease

## 2014-11-19 NOTE — Assessment & Plan Note (Signed)
Patient aware and engage with ongoing rehabilitation needs. Patient will reach out if other medical help desired for this problem

## 2014-11-20 LAB — HEPATITIS PANEL, ACUTE

## 2015-02-22 ENCOUNTER — Emergency Department (HOSPITAL_COMMUNITY): Payer: 59

## 2015-02-22 ENCOUNTER — Inpatient Hospital Stay (HOSPITAL_COMMUNITY)
Admission: EM | Admit: 2015-02-22 | Discharge: 2015-02-26 | DRG: 917 | Disposition: A | Discharge status: Home / Self Care | Payer: 59 | Attending: Internal Medicine | Admitting: Internal Medicine

## 2015-02-22 ENCOUNTER — Inpatient Hospital Stay (HOSPITAL_COMMUNITY): Payer: 59

## 2015-02-22 ENCOUNTER — Encounter (HOSPITAL_COMMUNITY)
Admission: EM | Disposition: A | Discharge status: Home / Self Care | Payer: 59 | Source: Home / Self Care | Attending: Internal Medicine

## 2015-02-22 ENCOUNTER — Encounter (HOSPITAL_COMMUNITY)
Admission: EM | Disposition: A | Discharge status: Home / Self Care | Payer: Self-pay | Source: Home / Self Care | Attending: Internal Medicine

## 2015-02-22 ENCOUNTER — Encounter (HOSPITAL_COMMUNITY): Payer: Self-pay | Admitting: Emergency Medicine

## 2015-02-22 DIAGNOSIS — I4510 Unspecified right bundle-branch block: Secondary | ICD-10-CM | POA: Diagnosis present

## 2015-02-22 DIAGNOSIS — M79605 Pain in left leg: Secondary | ICD-10-CM | POA: Diagnosis not present

## 2015-02-22 DIAGNOSIS — R0600 Dyspnea, unspecified: Secondary | ICD-10-CM

## 2015-02-22 DIAGNOSIS — I38 Endocarditis, valve unspecified: Secondary | ICD-10-CM | POA: Diagnosis present

## 2015-02-22 DIAGNOSIS — N189 Chronic kidney disease, unspecified: Secondary | ICD-10-CM | POA: Diagnosis present

## 2015-02-22 DIAGNOSIS — R29898 Other symptoms and signs involving the musculoskeletal system: Secondary | ICD-10-CM

## 2015-02-22 DIAGNOSIS — F1310 Sedative, hypnotic or anxiolytic abuse, uncomplicated: Secondary | ICD-10-CM | POA: Diagnosis present

## 2015-02-22 DIAGNOSIS — D72829 Elevated white blood cell count, unspecified: Secondary | ICD-10-CM

## 2015-02-22 DIAGNOSIS — F1990 Other psychoactive substance use, unspecified, uncomplicated: Secondary | ICD-10-CM

## 2015-02-22 DIAGNOSIS — R339 Retention of urine, unspecified: Secondary | ICD-10-CM | POA: Diagnosis present

## 2015-02-22 DIAGNOSIS — F1910 Other psychoactive substance abuse, uncomplicated: Secondary | ICD-10-CM | POA: Diagnosis not present

## 2015-02-22 DIAGNOSIS — R7989 Other specified abnormal findings of blood chemistry: Secondary | ICD-10-CM | POA: Diagnosis present

## 2015-02-22 DIAGNOSIS — N179 Acute kidney failure, unspecified: Secondary | ICD-10-CM | POA: Insufficient documentation

## 2015-02-22 DIAGNOSIS — M25551 Pain in right hip: Secondary | ICD-10-CM | POA: Diagnosis present

## 2015-02-22 DIAGNOSIS — I255 Ischemic cardiomyopathy: Secondary | ICD-10-CM

## 2015-02-22 DIAGNOSIS — G061 Intraspinal abscess and granuloma: Secondary | ICD-10-CM

## 2015-02-22 DIAGNOSIS — N184 Chronic kidney disease, stage 4 (severe): Secondary | ICD-10-CM

## 2015-02-22 DIAGNOSIS — F17200 Nicotine dependence, unspecified, uncomplicated: Secondary | ICD-10-CM | POA: Diagnosis present

## 2015-02-22 DIAGNOSIS — T405X1A Poisoning by cocaine, accidental (unintentional), initial encounter: Principal | ICD-10-CM | POA: Diagnosis present

## 2015-02-22 DIAGNOSIS — F1410 Cocaine abuse, uncomplicated: Secondary | ICD-10-CM | POA: Diagnosis not present

## 2015-02-22 DIAGNOSIS — I214 Non-ST elevation (NSTEMI) myocardial infarction: Secondary | ICD-10-CM | POA: Diagnosis not present

## 2015-02-22 DIAGNOSIS — I201 Angina pectoris with documented spasm: Secondary | ICD-10-CM | POA: Diagnosis present

## 2015-02-22 DIAGNOSIS — A419 Sepsis, unspecified organism: Secondary | ICD-10-CM

## 2015-02-22 DIAGNOSIS — M6282 Rhabdomyolysis: Secondary | ICD-10-CM

## 2015-02-22 DIAGNOSIS — E875 Hyperkalemia: Secondary | ICD-10-CM | POA: Diagnosis not present

## 2015-02-22 DIAGNOSIS — R778 Other specified abnormalities of plasma proteins: Secondary | ICD-10-CM | POA: Diagnosis present

## 2015-02-22 DIAGNOSIS — L03115 Cellulitis of right lower limb: Secondary | ICD-10-CM | POA: Diagnosis present

## 2015-02-22 DIAGNOSIS — R52 Pain, unspecified: Secondary | ICD-10-CM

## 2015-02-22 DIAGNOSIS — M79604 Pain in right leg: Secondary | ICD-10-CM | POA: Diagnosis present

## 2015-02-22 DIAGNOSIS — I340 Nonrheumatic mitral (valve) insufficiency: Secondary | ICD-10-CM | POA: Diagnosis not present

## 2015-02-22 HISTORY — PX: CARDIAC CATHETERIZATION: SHX172

## 2015-02-22 LAB — SEDIMENTATION RATE: SED RATE: 1 mm/h (ref 0–16)

## 2015-02-22 LAB — CBC WITH DIFFERENTIAL/PLATELET
BASOS ABS: 0 10*3/uL (ref 0.0–0.1)
BASOS PCT: 0 %
EOS ABS: 0 10*3/uL (ref 0.0–0.7)
Eosinophils Relative: 0 %
HCT: 47.8 % (ref 39.0–52.0)
Hemoglobin: 16.1 g/dL (ref 13.0–17.0)
Lymphocytes Relative: 9 %
Lymphs Abs: 1.8 10*3/uL (ref 0.7–4.0)
MCH: 30.2 pg (ref 26.0–34.0)
MCHC: 33.7 g/dL (ref 30.0–36.0)
MCV: 89.7 fL (ref 78.0–100.0)
MONO ABS: 0.9 10*3/uL (ref 0.1–1.0)
MONOS PCT: 4 %
Neutro Abs: 18.1 10*3/uL — ABNORMAL HIGH (ref 1.7–7.7)
Neutrophils Relative %: 87 %
Platelets: 240 10*3/uL (ref 150–400)
RBC: 5.33 MIL/uL (ref 4.22–5.81)
RDW: 12.4 % (ref 11.5–15.5)
WBC: 20.9 10*3/uL — ABNORMAL HIGH (ref 4.0–10.5)

## 2015-02-22 LAB — BASIC METABOLIC PANEL
ANION GAP: 10 (ref 5–15)
Anion gap: 17 — ABNORMAL HIGH (ref 5–15)
BUN: 59 mg/dL — ABNORMAL HIGH (ref 6–20)
BUN: 55 mg/dL — ABNORMAL HIGH (ref 6–20)
CALCIUM: 7.9 mg/dL — AB (ref 8.9–10.3)
CALCIUM: 8.1 mg/dL — AB (ref 8.9–10.3)
CO2: 18 mmol/L — ABNORMAL LOW (ref 22–32)
CO2: 20 mmol/L — ABNORMAL LOW (ref 22–32)
Chloride: 101 mmol/L (ref 101–111)
Chloride: 105 mmol/L (ref 101–111)
Creatinine, Ser: 2.7 mg/dL — ABNORMAL HIGH (ref 0.61–1.24)
Creatinine, Ser: 2.45 mg/dL — ABNORMAL HIGH (ref 0.61–1.24)
GFR calc non Af Amer: 32 mL/min — ABNORMAL LOW (ref >60)
GFR, EST AFRICAN AMERICAN: 37 mL/min — AB (ref >60)
GFR, EST AFRICAN AMERICAN: 42 mL/min — AB (ref >60)
GFR, EST NON AFRICAN AMERICAN: 36 mL/min — AB (ref >60)
GLUCOSE: 77 mg/dL (ref 65–99)
Glucose, Bld: 142 mg/dL — ABNORMAL HIGH (ref 65–99)
POTASSIUM: 4.7 mmol/L (ref 3.5–5.1)
Potassium: 6.1 mmol/L (ref 3.5–5.1)
SODIUM: 136 mmol/L (ref 135–145)
SODIUM: 135 mmol/L (ref 135–145)

## 2015-02-22 LAB — RAPID URINE DRUG SCREEN, HOSP PERFORMED
Amphetamines: NOT DETECTED
BENZODIAZEPINES: POSITIVE — AB
Barbiturates: NOT DETECTED
COCAINE: POSITIVE — AB
OPIATES: POSITIVE — AB
Tetrahydrocannabinol: POSITIVE — AB

## 2015-02-22 LAB — URINALYSIS, ROUTINE W REFLEX MICROSCOPIC
Bilirubin Urine: NEGATIVE
GLUCOSE, UA: 250 mg/dL — AB
KETONES UR: 15 mg/dL — AB
LEUKOCYTES UA: NEGATIVE
Nitrite: NEGATIVE
PH: 5 (ref 5.0–8.0)
PROTEIN: 30 mg/dL — AB
Specific Gravity, Urine: 1.017 (ref 1.005–1.030)

## 2015-02-22 LAB — TROPONIN I
Troponin I: 46.69 ng/mL (ref <0.031)
Troponin I: >65 ng/mL
Troponin I: 61.88 ng/mL

## 2015-02-22 LAB — URINE MICROSCOPIC-ADD ON

## 2015-02-22 LAB — MRSA PCR SCREENING: MRSA BY PCR: POSITIVE — AB

## 2015-02-22 LAB — PROCALCITONIN: Procalcitonin: 42.12 ng/mL

## 2015-02-22 LAB — C-REACTIVE PROTEIN: CRP: 7.7 mg/dL — AB (ref <1.0)

## 2015-02-22 LAB — LACTIC ACID, PLASMA: Lactic Acid, Venous: 1.5 mmol/L (ref 0.5–2.0)

## 2015-02-22 LAB — CK: Total CK: 37697 U/L — ABNORMAL HIGH (ref 49–397)

## 2015-02-22 SURGERY — LEFT HEART CATH AND CORONARY ANGIOGRAPHY
Anesthesia: LOCAL

## 2015-02-22 MED ORDER — VERAPAMIL HCL 2.5 MG/ML IV SOLN
INTRAVENOUS | Status: AC
  Filled 2015-02-22: qty 2

## 2015-02-22 MED ORDER — LIDOCAINE HCL (PF) 1 % IJ SOLN
INTRAMUSCULAR | Status: AC
  Filled 2015-02-22: qty 30

## 2015-02-22 MED ORDER — IOHEXOL 350 MG/ML SOLN
INTRAVENOUS | Status: DC | PRN
  Administered 2015-02-22: 80 mL via INTRA_ARTERIAL

## 2015-02-22 MED ORDER — NICOTINE 21 MG/24HR TD PT24: 21.0000 mg | MEDICATED_PATCH | Freq: Every day | TRANSDERMAL | Status: DC

## 2015-02-22 MED ORDER — SODIUM CHLORIDE 0.9 % IV SOLN
INTRAVENOUS | Status: DC
  Administered 2015-02-22: 08:00:00 via INTRAVENOUS

## 2015-02-22 MED ORDER — CHLORHEXIDINE GLUCONATE CLOTH 2 % EX PADS
6.0000 | MEDICATED_PAD | Freq: Every day | CUTANEOUS | Status: DC
  Administered 2015-02-23 – 2015-02-25 (×3): 6 via TOPICAL

## 2015-02-22 MED ORDER — FENTANYL CITRATE (PF) 100 MCG/2ML IJ SOLN
INTRAMUSCULAR | Status: DC | PRN
  Administered 2015-02-22: 50 ug via INTRAVENOUS

## 2015-02-22 MED ORDER — INFLUENZA VAC SPLIT QUAD 0.5 ML IM SUSY
0.5000 mL | PREFILLED_SYRINGE | INTRAMUSCULAR | Status: AC
  Administered 2015-02-23: 0.5 mL via INTRAMUSCULAR
  Filled 2015-02-22: qty 0.5

## 2015-02-22 MED ORDER — MIDAZOLAM HCL 2 MG/2ML IJ SOLN
INTRAMUSCULAR | Status: DC | PRN
  Administered 2015-02-22: 2 mg via INTRAVENOUS
  Administered 2015-02-22: 1 mg via INTRAVENOUS

## 2015-02-22 MED ORDER — VITAMIN B-1 100 MG PO TABS
100.0000 mg | ORAL_TABLET | Freq: Every day | ORAL | Status: DC
  Filled 2015-02-22: qty 1

## 2015-02-22 MED ORDER — SODIUM CHLORIDE 0.9 % IV SOLN
1250.0000 mg | Freq: Once | INTRAVENOUS | Status: AC
  Administered 2015-02-22: 1250 mg via INTRAVENOUS
  Filled 2015-02-22: qty 1250

## 2015-02-22 MED ORDER — DEXTROSE 50 % IV SOLN
2.0000 | Freq: Once | INTRAVENOUS | Status: AC
  Administered 2015-02-22: 100 mL via INTRAVENOUS
  Filled 2015-02-22: qty 100

## 2015-02-22 MED ORDER — SODIUM CHLORIDE 0.9 % IV SOLN: 250.0000 mL | INTRAVENOUS | Status: DC | PRN

## 2015-02-22 MED ORDER — HEPARIN SODIUM (PORCINE) 5000 UNIT/ML IJ SOLN
5000.0000 [IU] | Freq: Three times a day (TID) | INTRAMUSCULAR | Status: DC
  Administered 2015-02-22: 5000 [IU] via SUBCUTANEOUS
  Filled 2015-02-22: qty 1

## 2015-02-22 MED ORDER — SODIUM CHLORIDE 0.9 % IJ SOLN
3.0000 mL | Freq: Two times a day (BID) | INTRAMUSCULAR | Status: DC
Start: 2015-02-22 — End: 2015-02-26
  Administered 2015-02-22 – 2015-02-25 (×5): 3 mL via INTRAVENOUS

## 2015-02-22 MED ORDER — VANCOMYCIN HCL 500 MG IV SOLR: 500.0000 mg | Freq: Two times a day (BID) | INTRAVENOUS | Status: DC

## 2015-02-22 MED ORDER — FOLIC ACID 1 MG PO TABS
1.0000 mg | ORAL_TABLET | Freq: Every day | ORAL | Status: DC
Start: 2015-02-22 — End: 2015-02-22
  Filled 2015-02-22: qty 1

## 2015-02-22 MED ORDER — HEPARIN BOLUS VIA INFUSION
2000.0000 [IU] | Freq: Once | INTRAVENOUS | Status: AC
  Administered 2015-02-22: 2000 [IU] via INTRAVENOUS
  Filled 2015-02-22: qty 2000

## 2015-02-22 MED ORDER — ADULT MULTIVITAMIN W/MINERALS CH
1.0000 | ORAL_TABLET | Freq: Every day | ORAL | Status: DC
  Administered 2015-02-22 – 2015-02-26 (×5): 1 via ORAL
  Filled 2015-02-22 (×5): qty 1

## 2015-02-22 MED ORDER — MIDAZOLAM HCL 2 MG/2ML IJ SOLN
INTRAMUSCULAR | Status: AC
  Filled 2015-02-22: qty 2

## 2015-02-22 MED ORDER — HYDROMORPHONE HCL 1 MG/ML IJ SOLN
1.0000 mg | INTRAMUSCULAR | Status: DC | PRN
  Administered 2015-02-22 (×2): 2 mg via INTRAVENOUS
  Administered 2015-02-22 (×2): 1 mg via INTRAVENOUS
  Administered 2015-02-23 (×3): 2 mg via INTRAVENOUS
  Administered 2015-02-23 (×2): 1 mg via INTRAVENOUS
  Administered 2015-02-23: 2 mg via INTRAVENOUS
  Administered 2015-02-24: 1 mg via INTRAVENOUS
  Administered 2015-02-24 – 2015-02-25 (×5): 2 mg via INTRAVENOUS
  Administered 2015-02-25 (×2): 1 mg via INTRAVENOUS
  Administered 2015-02-26: 2 mg via INTRAVENOUS
  Filled 2015-02-22: qty 1
  Filled 2015-02-22: qty 2
  Filled 2015-02-22 (×2): qty 1
  Filled 2015-02-22 (×14): qty 2
  Filled 2015-02-22: qty 1

## 2015-02-22 MED ORDER — SODIUM CHLORIDE 0.9 % IV SOLN
INTRAVENOUS | Status: DC | PRN
  Administered 2015-02-22: 10 mL/h via INTRAVENOUS

## 2015-02-22 MED ORDER — LORAZEPAM 2 MG/ML IJ SOLN
1.0000 mg | Freq: Once | INTRAMUSCULAR | Status: AC
  Administered 2015-02-22: 1 mg via INTRAVENOUS
  Filled 2015-02-22: qty 1

## 2015-02-22 MED ORDER — SODIUM CHLORIDE 0.9 % IV BOLUS (SEPSIS)
2000.0000 mL | Freq: Once | INTRAVENOUS | Status: AC
  Administered 2015-02-22: 2000 mL via INTRAVENOUS

## 2015-02-22 MED ORDER — CARVEDILOL 3.125 MG PO TABS
3.1250 mg | ORAL_TABLET | Freq: Two times a day (BID) | ORAL | Status: DC
  Filled 2015-02-22: qty 1

## 2015-02-22 MED ORDER — FENTANYL CITRATE (PF) 100 MCG/2ML IJ SOLN
50.0000 ug | Freq: Once | INTRAMUSCULAR | Status: AC
  Administered 2015-02-22: 50 ug via INTRAVENOUS
  Filled 2015-02-22: qty 2

## 2015-02-22 MED ORDER — ONDANSETRON HCL 4 MG/2ML IJ SOLN
4.0000 mg | Freq: Four times a day (QID) | INTRAMUSCULAR | Status: DC | PRN
  Administered 2015-02-23: 4 mg via INTRAVENOUS
  Filled 2015-02-22: qty 2

## 2015-02-22 MED ORDER — SODIUM CHLORIDE 0.9 % IV SOLN
INTRAVENOUS | Status: DC
  Administered 2015-02-22: 19:00:00 via INTRAVENOUS

## 2015-02-22 MED ORDER — DEXTROSE 5 % IV SOLN: 2.0000 g | Freq: Once | INTRAVENOUS | Status: DC

## 2015-02-22 MED ORDER — ASPIRIN EC 325 MG PO TBEC
325.0000 mg | DELAYED_RELEASE_TABLET | Freq: Every day | ORAL | Status: DC
  Administered 2015-02-22: 325 mg via ORAL
  Filled 2015-02-22: qty 1

## 2015-02-22 MED ORDER — NITROGLYCERIN 1 MG/10 ML FOR IR/CATH LAB
INTRA_ARTERIAL | Status: AC
  Filled 2015-02-22: qty 10

## 2015-02-22 MED ORDER — INSULIN ASPART 100 UNIT/ML IV SOLN
10.0000 [IU] | Freq: Once | INTRAVENOUS | Status: AC
  Administered 2015-02-22: 10 [IU] via INTRAVENOUS
  Filled 2015-02-22: qty 1

## 2015-02-22 MED ORDER — SODIUM CHLORIDE 0.9 % IJ SOLN: 3.0000 mL | INTRAMUSCULAR | Status: DC | PRN

## 2015-02-22 MED ORDER — ASPIRIN EC 81 MG PO TBEC: 81.0000 mg | DELAYED_RELEASE_TABLET | Freq: Every day | ORAL | Status: DC

## 2015-02-22 MED ORDER — SODIUM CHLORIDE 0.9 % IV SOLN
INTRAVENOUS | Status: DC
  Administered 2015-02-22: 1000 mL via INTRAVENOUS
  Administered 2015-02-23: 02:00:00 via INTRAVENOUS
  Administered 2015-02-23: 150 mL via INTRAVENOUS

## 2015-02-22 MED ORDER — DIAZEPAM 5 MG PO TABS: 5.0000 mg | ORAL_TABLET | Freq: Four times a day (QID) | ORAL | Status: DC | PRN

## 2015-02-22 MED ORDER — HEPARIN (PORCINE) IN NACL 2-0.9 UNIT/ML-% IJ SOLN
INTRAMUSCULAR | Status: AC
  Filled 2015-02-22: qty 1500

## 2015-02-22 MED ORDER — FENTANYL CITRATE (PF) 100 MCG/2ML IJ SOLN
INTRAMUSCULAR | Status: AC
  Filled 2015-02-22: qty 2

## 2015-02-22 MED ORDER — DOCUSATE SODIUM 100 MG PO CAPS
100.0000 mg | ORAL_CAPSULE | Freq: Two times a day (BID) | ORAL | Status: DC
  Administered 2015-02-22 (×2): 100 mg via ORAL
  Filled 2015-02-22 (×2): qty 1

## 2015-02-22 MED ORDER — OXYCODONE HCL 5 MG PO TABS
10.0000 mg | ORAL_TABLET | ORAL | Status: DC | PRN
  Administered 2015-02-22 – 2015-02-26 (×7): 10 mg via ORAL
  Filled 2015-02-22 (×7): qty 2

## 2015-02-22 MED ORDER — FOLIC ACID 1 MG PO TABS
1.0000 mg | ORAL_TABLET | Freq: Every day | ORAL | Status: DC
  Administered 2015-02-22 – 2015-02-26 (×5): 1 mg via ORAL
  Filled 2015-02-22 (×5): qty 1

## 2015-02-22 MED ORDER — LIDOCAINE HCL (PF) 1 % IJ SOLN
INTRAMUSCULAR | Status: DC | PRN
  Administered 2015-02-22: 18:00:00

## 2015-02-22 MED ORDER — VITAMIN B-1 100 MG PO TABS
100.0000 mg | ORAL_TABLET | Freq: Every day | ORAL | Status: DC
  Administered 2015-02-22 – 2015-02-26 (×5): 100 mg via ORAL
  Filled 2015-02-22 (×5): qty 1

## 2015-02-22 MED ORDER — DEXTROSE 5 % IV SOLN
1.0000 g | Freq: Three times a day (TID) | INTRAVENOUS | Status: DC
  Administered 2015-02-22 – 2015-02-25 (×8): 1 g via INTRAVENOUS
  Filled 2015-02-22 (×12): qty 1

## 2015-02-22 MED ORDER — ASPIRIN EC 81 MG PO TBEC
81.0000 mg | DELAYED_RELEASE_TABLET | Freq: Every day | ORAL | Status: DC
  Administered 2015-02-23 – 2015-02-26 (×4): 81 mg via ORAL
  Filled 2015-02-22 (×4): qty 1

## 2015-02-22 MED ORDER — GADOBENATE DIMEGLUMINE 529 MG/ML IV SOLN
14.0000 mL | Freq: Once | INTRAVENOUS | Status: AC | PRN
  Administered 2015-02-22: 14 mL via INTRAVENOUS

## 2015-02-22 MED ORDER — VANCOMYCIN HCL 500 MG IV SOLR
500.0000 mg | Freq: Two times a day (BID) | INTRAVENOUS | Status: DC
  Administered 2015-02-22 – 2015-02-26 (×8): 500 mg via INTRAVENOUS
  Filled 2015-02-22 (×11): qty 500

## 2015-02-22 MED ORDER — ACETAMINOPHEN 325 MG PO TABS: 650.0000 mg | ORAL_TABLET | ORAL | Status: DC | PRN

## 2015-02-22 MED ORDER — HYDROMORPHONE HCL 1 MG/ML IJ SOLN
1.0000 mg | Freq: Once | INTRAMUSCULAR | Status: AC
  Administered 2015-02-22: 1 mg via INTRAVENOUS
  Filled 2015-02-22: qty 1

## 2015-02-22 MED ORDER — SODIUM CHLORIDE 0.9 % IV SOLN: INTRAVENOUS | Status: DC

## 2015-02-22 MED ORDER — MUPIROCIN 2 % EX OINT
1.0000 "application " | TOPICAL_OINTMENT | Freq: Two times a day (BID) | CUTANEOUS | Status: DC
  Administered 2015-02-22 – 2015-02-25 (×7): 1 via NASAL
  Filled 2015-02-22 (×3): qty 22

## 2015-02-22 MED ORDER — SODIUM CHLORIDE 0.9 % IJ SOLN
3.0000 mL | Freq: Two times a day (BID) | INTRAMUSCULAR | Status: DC
  Administered 2015-02-22 – 2015-02-24 (×4): 3 mL via INTRAVENOUS

## 2015-02-22 MED ORDER — ALUM & MAG HYDROXIDE-SIMETH 200-200-20 MG/5ML PO SUSP: 30.0000 mL | Freq: Four times a day (QID) | ORAL | Status: DC | PRN

## 2015-02-22 MED ORDER — HEPARIN (PORCINE) IN NACL 100-0.45 UNIT/ML-% IJ SOLN
900.0000 [IU]/h | INTRAMUSCULAR | Status: DC
  Administered 2015-02-22: 900 [IU]/h via INTRAVENOUS
  Filled 2015-02-22: qty 250

## 2015-02-22 MED ORDER — SODIUM CHLORIDE 0.9 % IV SOLN
2.0000 g | Freq: Once | INTRAVENOUS | Status: AC
  Administered 2015-02-22: 2 g via INTRAVENOUS
  Filled 2015-02-22: qty 20

## 2015-02-22 SURGICAL SUPPLY — 12 items
BAG SNAP BAND KOVER 36X36 (MISCELLANEOUS) ×2 IMPLANT
CATH INFINITI 5 FR JL3.5 (CATHETERS) ×2 IMPLANT
CATH INFINITI 5FR MULTPACK ANG (CATHETERS) ×2 IMPLANT
GLIDESHEATH SLEND A-KIT 6F 22G (SHEATH) ×2 IMPLANT
GLIDESHEATH SLEND SS 6F .021 (SHEATH) ×2 IMPLANT
KIT HEART LEFT (KITS) ×2 IMPLANT
PACK CARDIAC CATHETERIZATION (CUSTOM PROCEDURE TRAY) ×2 IMPLANT
SHEATH PINNACLE 6F 10CM (SHEATH) ×2 IMPLANT
TRANSDUCER W/STOPCOCK (MISCELLANEOUS) ×2 IMPLANT
TUBING CIL FLEX 10 FLL-RA (TUBING) ×2 IMPLANT
WIRE EMERALD 3MM-J .035X150CM (WIRE) ×2 IMPLANT
WIRE SAFE-T 1.5MM-J .035X260CM (WIRE) ×2 IMPLANT

## 2015-02-22 NOTE — ED Provider Notes (Signed)
CSN: [id number]     Arrival date & time 02/22/15  0406 History   First MD Initiated Contact with Patient 02/22/15 [phone]     Chief Complaint  Patient presents with  . Leg Pain  . Hip Pain     (Consider location/radiation/quality/duration/timing/severity/associated sxs/prior Treatment) HPI Comments: 19 y.o M with hx of IVDA comes in with cc of leg pain and weakness. Pt reports that he went to bed feeling well, but woke up about 3 am with pain in his legs along with some numbness. He also couldn't walk. He denies any new back pain. Pt admits to active drug use, last use was 2 days ago. Uses heroine. Denies any other drug use. Pt denies associated urinary incontinence, urinary retention, bowel incontinence, saddle anesthesia. Pt has no chest pain, back pain, abd pain. He denies any new rash. No fevers, chills, night sweats. Pt was unable to get into the bed with his own strength per RN. Records indicate admission for septic shock with rhabdo, heart failure, renal failure. ECHO didn't reveal endocarditis.   ROS 10 Systems reviewed and are negative for acute change except as noted in the HPI.       Patient is a 19 y.o. male presenting with leg pain and hip pain. The history is provided by the patient and medical records.  Leg Pain Hip Pain    Past Medical History  Diagnosis Date  . Sinusitis   . Anxiety   . Hepatitis B   . Drug abuse and dependence ([place])     admit with OD 11/2014: rhabdo and AKI, ARF -    Past Surgical History  Procedure Laterality Date  . No past surgeries     Family History  Problem Relation Age of Onset  . Adopted: Yes   Social History  Substance Use Topics  . Smoking status: Current Some Day Smoker  . Smokeless tobacco: None  . Alcohol Use: Yes    Review of Systems    Allergies  Penicillins; Penicillins; Sulfa antibiotics; and Sulfa antibiotics  Home Medications   Prior to Admission medications   Not on File   BP 114/76 mmHg  Pulse 72   Temp(Src) 98.5 F (36.9 C) (Oral)  Resp 17  SpO2 100% Physical Exam  Constitutional: He is oriented to person, place, and time. He appears well-developed.  HENT:  Head: Atraumatic.  Neck: Neck supple.  Cardiovascular: Normal rate.   No murmur heard. Pulmonary/Chest: Effort normal. No respiratory distress. He has no wheezes.  Abdominal: Soft. He exhibits no distension.  Musculoskeletal:  Pt has NO focal tenderness over the spine No step offs, no erythema. Pt has hyperreflexia over the patella patellar bilaterally. Able to discriminate between sharp and dull. 4+/5 Lower extremity strength Intact rectal tone  Pt's knee exam is normal bilaterally. We are able to internal and externally rotate the hip, but there is some tenderness with rotation of the R hip.  Neurological: He is alert and oriented to person, place, and time.  Skin: Skin is warm. Rash noted.  Left foot has an erythematous patch. Extremities show diffuse rash from iv use. Pt has erythematous maculopapular rash to the torso   Nursing note and vitals reviewed.   ED Course  Procedures (including critical care time) Labs Review Labs Reviewed  CBC WITH DIFFERENTIAL/PLATELET - Abnormal; Notable for the following:    WBC 20.9 (*)    Neutro Abs 18.1 (*)    All other components within normal limits  BASIC METABOLIC PANEL -  Abnormal; Notable for the following:    Potassium 6.1 (*)    CO2 18 (*)    Glucose, Bld 142 (*)    BUN 59 (*)    Creatinine, Ser 2.70 (*)    Calcium 7.9 (*)    GFR calc non Af Amer 32 (*)    GFR calc Af Amer 37 (*)    Anion gap 17 (*)    All other components within normal limits  TROPONIN I - Abnormal; Notable for the following:    Troponin I 46.69 (*)    All other components within normal limits  CULTURE, BLOOD (ROUTINE X 2)  CULTURE, BLOOD (ROUTINE X 2)  CULTURE, BLOOD (SINGLE)  SEDIMENTATION RATE  C-REACTIVE PROTEIN  CK  URINALYSIS, ROUTINE W REFLEX MICROSCOPIC (NOT AT [facility])  URINE  RAPID DRUG SCREEN, HOSP PERFORMED    Imaging Review No results found. I have personally reviewed and evaluated these images and lab results as part of my medical decision-making.   EKG Interpretation None      MDM   Final diagnoses:  Polysubstance abuse  IV drug abuse  Non-traumatic rhabdomyolysis  NSTEMI (non-ST elevated myocardial infarction) ([place])  Endocarditis    Pt comes in with cc of lower extremity weakness and pain + numbness - primarily over the area distal to the knee bilaterally. He is unable to bear weight.  Exam - mild hyperreflexia over the patellar tendon. No focal back pain. R hip is slightly tender. Pt has a rash to the foot.  Clinical concerns for the following: -Epidural abscess, Osteomyeltis, diskitis - all of which leading to some cord compression causing the lower extremity weakness. - Rt hip sptic arthritis - although exam not consistent with severe tenderness expected with passive ROM.  - Myositis - Endocarditis  Labs ordered, and post void residual ordered and is > 200 cc  I called Dr. Claudine Mouton at [facility] - i think we will have to r/o cord issues and so he will be accepting the patient. No focal findings - but pt couldn't get up for me, and it is imperative that we r/o cord issues and not wait too long  CRITICAL CARE Performed by: Varney Biles   Total critical care time: 33 minutes  Critical care time was exclusive of separately billable procedures and treating other patients.  Critical care was necessary to treat or prevent imminent or life-threatening deterioration.  Critical care was time spent personally by me on the following activities: development of treatment plan with patient and/or surrogate as well as nursing, discussions with consultants, evaluation of patient's response to treatment, examination of patient, obtaining history from patient or surrogate, ordering and performing treatments and interventions, ordering and review of  laboratory studies, ordering and review of radiographic studies, pulse oximetry and re-evaluation of patient's condition.   Varney Biles, MD 02/22/15 [phone]

## 2015-02-22 NOTE — ED Notes (Signed)
Spoke with Pharmacy , Pharmacy states due to patient Allergies to Penicillins nurse needs to hold Maxipime until decision made of safety to give.

## 2015-02-22 NOTE — ED Notes (Signed)
Patient still off the floor for scan. 

## 2015-02-22 NOTE — H&P (Signed)
[facility] History and Physical                                                                                    Erik [name], is a 19 y.o. male  MRN: [id number]   DOB - 11/21/1995  Admit Date - 02/22/2015  Outpatient Primary MD for the patient is Gwendolyn Grant, MD  Referring Physician:  Dr. Claudine Mouton  Chief Complaint:   Chief Complaint  Patient presents with  . Leg Pain  . Hip Pain     HPI  Erik [name]  is a 19 y.o. male, with an admission in 11/2014 for narcotic induced respiratory failure, presents to [facility] ER with severe pain in both feet.  The patient states that he awoke at 3:00 am this morning with severe foot pain, unable to walk with bilateral leg weakness and an inability to urinate. He last used IV heroin 2 days ago. The patient reports he does not drink alcohol but occasionally smokes tobacco and marijuana. Yesterday he was up and walking around in his usual state of health with no difficulty. He has not had any incontinence of bowel.  In the emergency department this morning his troponin is 46.69 and he has significant ST depression in V4, V5, V6. As well as II, III and a new RBBB.  His potassium is 6.1, WBC is 20.  Many labs are still pending including lactic acid, UDS, ESR, CRP.  Patient repeatedly requests pain medication.  Review of Systems  HENT: Positive for congestion.   Eyes: Negative.   Respiratory: Positive for cough and sputum production.   Cardiovascular: Positive for chest pain.  Gastrointestinal: Positive for abdominal pain.  Genitourinary: Positive for dysuria.  Musculoskeletal: Positive for myalgias.  Skin: Positive for rash.  Neurological: Positive for focal weakness and weakness.  Endo/Heme/Allergies: Negative.   Psychiatric/Behavioral: Negative.      Past Medical History  Past Medical History  Diagnosis Date  . Sinusitis   . Anxiety   . Hepatitis B   . Drug abuse and dependence ([place])     admit with OD 11/2014: rhabdo and AKI, ARF -      Past Surgical History  Procedure Laterality Date  . No past surgeries        Social History Social History  Substance Use Topics  . Smoking status: Current Some Day Smoker  . Smokeless tobacco: Not on file  . Alcohol Use: Yes   lives with roommates. Occasionally uses tobacco and marijuana. Injects heroine IV.  States that he goes through times where he uses it frequently and other times where he doesn't use it all.  Family History Family History  Problem Relation Age of Onset  . Adopted: Yes   tells me his father is a Engineer, maintenance for [employer].  Prior to Admission medications   Not on File    Allergies  Allergen Reactions  . Penicillins Anaphylaxis    All "-cillins."   . Penicillins Swelling    Lips swell.    . Sulfa Antibiotics     Unknown.   . Sulfa Antibiotics Swelling    Physical Exam  Vitals  Blood pressure 117/59, pulse  87, temperature 98.5 F (36.9 C), temperature source Oral, resp. rate 14, SpO2 91 %.   General: Thin, Wd young male lying in bed appears in pain.  Psych:  Not Suicidal or Homicidal, Awake Alert, Oriented X 3.  Neuro:   ALL C.Nerves Intact, Strength 5/5 all 4 extremities and symetric, Sensation intact all 4 extremities.  Unable to urinate  ENT:  Ears and Eyes appear Normal, Conjunctivae clear, PER. Moist oral mucosa, erythema in the oropharynx.  Neck:  Supple, No lymphadenopathy appreciated  Respiratory:  Symmetrical chest wall movement, Good air movement bilaterally, CTAB. Patient unable to sit up or to the side to allow me to auscultate his back.  Cardiac:  RRR, No Murmurs, no LE edema noted, no JVD.    Abdomen:  Positive bowel sounds, Soft, Non tender, Non distended,  No masses appreciated  Skin:  Small papules scattered across his chest no other obvious rashes, bruises or lesions  Extremities:  Able to move all 4. 5/5 strength in each,  no effusions.  Area of erythema and swelling noted on his right instep. The same is  noted on his left medial foot arch to a lesser extent.  Data Review  Wt Readings from Last 3 Encounters:  11/19/14 64.638 kg (142 lb 8 oz)  11/16/14 71.1 kg (156 lb 12 oz)  11/08/14 67.132 kg (148 lb)    CBC  Recent Labs Lab 02/22/15 0509  WBC 20.9*  HGB 16.1  HCT 47.8  PLT 240  MCV 89.7  MCH 30.2  MCHC 33.7  RDW 12.4  LYMPHSABS 1.8  MONOABS 0.9  EOSABS 0.0  BASOSABS 0.0    Chemistries   Recent Labs Lab 02/22/15 0509  NA 136  K 6.1*  CL 101  CO2 18*  GLUCOSE 142*  BUN 59*  CREATININE 2.70*  CALCIUM 7.9*     Cardiac Enzymes  Recent Labs Lab 02/22/15 0509  TROPONINI 46.69*    Urinalysis    Component Value Date/Time   COLORURINE YELLOW 02/22/2015 0719   COLORURINE Yellow 02/27/2014 0351   APPEARANCEUR CLOUDY* 02/22/2015 0719   APPEARANCEUR Clear 02/27/2014 0351   LABSPEC 1.017 02/22/2015 0719   LABSPEC 1.025 02/27/2014 0351   PHURINE 5.0 02/22/2015 0719   PHURINE 6.0 02/27/2014 0351   GLUCOSEU 250* 02/22/2015 0719   GLUCOSEU Negative 02/27/2014 0351   HGBUR LARGE* 02/22/2015 0719   HGBUR Negative 02/27/2014 0351   BILIRUBINUR NEGATIVE 02/22/2015 0719   BILIRUBINUR Negative 02/27/2014 0351   KETONESUR 15* 02/22/2015 0719   KETONESUR Negative 02/27/2014 0351   PROTEINUR 30* 02/22/2015 0719   PROTEINUR Negative 02/27/2014 0351   UROBILINOGEN 0.2 11/12/2014 2124   NITRITE NEGATIVE 02/22/2015 0719   NITRITE Negative 02/27/2014 0351   LEUKOCYTESUR NEGATIVE 02/22/2015 0719   LEUKOCYTESUR Negative 02/27/2014 0351    Imaging results:   No results found.  My personal review of EKG:  Deep ST depressions in V4 - 6, II, III, AVF, ST elevation in V1, V2. new incomplete RBBB.   Assessment & Plan  Principal Problem:   Lower extremity pain, bilateral Active Problems:   IVDU (intravenous drug user)   Elevated troponin   Acute kidney injury ([place])   Hyperkalemia   Leukocytosis   Bilateral lower extremity pain and weakness with an inability  to urinate. Need to rule out cord compression.  ESR, CRP, Blood cultures are pending.  Foley placed. Will obtain stat MR lumbar spine and bilateral xrays of the feet.  Dr. Kathyrn Sheriff of neurosurgery, has been  contacted and will review the MR once it is completed.  Patient placed on PRN Dilaudid and Oxy IR for pain.  Elevated Troponin with significant ST changes in most leads Concern for pericarditis or endocarditis.  Troponin is 46.  Cardiology has been consulted.  Patient likely does need a Cath at some point per cards.  Will check a stat 2D echo, cycle troponin, ASA 325, check lipids.  No beta blocker will be given at this point as his UDS has not yet resulted and we are concerned for possible cocaine use.  Leukocytosis Need to rule out epidural abscess, pneumonia, may have abscesses in his feet.  Blood, urine, sputum cultures have been collected.  Lactic acid and procalcitonin are pending. CXR and bilateral foot xrays are pending.  Vanc and Cefepime have been started.  Pending work up results, Infectious disease will likely be consulted.  Acute Kidney Injury with acute urinary retention. Likely secondary to poor perfusion and drug use.  U/A is negative for infection.  Foley placed.  Placed on IVF at 125 and hour. Will consider renal U/S pending MR lumbar spine results.  Hyperkalemia Secondary to Acute Kidney Injury.  Patient received insulin, D50 and calcium gluconate in the ER.  Repeat Bmet pending.    Refractory IVDA Social work consulted.  CIWA protocol ordered.  Needs rehab and counseling.    Consultants Called:   Cardiology, Dr. Meda Coffee,  Neurosurgery, Dr. Kathyrn Sheriff   Family Communication:     Spoke on the phone with patient's father to make him aware of his son's admission.  Code Status:    full  Condition:    Very guarded.  Potential Disposition:   To be determined.  Time spent in minutes : [place],  [place] on 02/22/2015 at 8:19 AM Between  7am to 7pm - Pager - [phone] After 7pm go to [email or web address] - password [email or web address] And look for the night coverage person covering me after hours

## 2015-02-22 NOTE — Progress Notes (Signed)
ANTIBIOTIC CONSULT NOTE - INITIAL  Pharmacy Consult for vancomycin + aztreonam Indication: r/o endocarditis, abscess  Allergies  Allergen Reactions  . Penicillins Anaphylaxis    All "-cillins."   . Penicillins Swelling    Lips swell.    . Sulfa Antibiotics     Unknown.   . Sulfa Antibiotics Swelling    Patient Measurements:   Adjusted Body Weight:   Vital Signs: Temp: 98.5 F (36.9 C) (12/17 0623) Temp Source: Oral (12/17 0623) BP: 116/60 mmHg (12/17 0900) Pulse Rate: 76 (12/17 0900) Intake/Output from previous day:   Intake/Output from this shift: Total I/O In: -  Out: 300 [Urine:300]  Labs:  Recent Labs  02/22/15 0509  WBC 20.9*  HGB 16.1  PLT 240  CREATININE 2.70*   CrCl cannot be calculated (Unknown ideal weight.). No results for input(s): VANCOTROUGH, VANCOPEAK, VANCORANDOM, GENTTROUGH, GENTPEAK, GENTRANDOM, TOBRATROUGH, TOBRAPEAK, TOBRARND, AMIKACINPEAK, AMIKACINTROU, AMIKACIN in the last 72 hours.   Microbiology: No results found for this or any previous visit (from the past 720 hour(s)).  Medical History: Past Medical History  Diagnosis Date  . Sinusitis   . Anxiety   . Hepatitis B   . Drug abuse and dependence ([place])     admit with OD 11/2014: rhabdo and AKI, ARF -     Medications:  Anti-infectives    Start     Dose/Rate Route Frequency Ordered Stop   02/22/15 2000  vancomycin (VANCOCIN) 500 mg in sodium chloride 0.9 % 100 mL IVPB     500 mg 100 mL/hr over 60 Minutes Intravenous Every 12 hours 02/22/15 1055     02/22/15 1900  vancomycin (VANCOCIN) 500 mg in sodium chloride 0.9 % 100 mL IVPB  Status:  Discontinued     500 mg 100 mL/hr over 60 Minutes Intravenous Every 12 hours 02/22/15 0745 02/22/15 0749   02/22/15 1030  aztreonam (AZACTAM) 1 g in dextrose 5 % 50 mL IVPB     1 g 100 mL/hr over 30 Minutes Intravenous Every 8 hours 02/22/15 1010     02/22/15 0700  vancomycin (VANCOCIN) 1,250 mg in sodium chloride 0.9 % 250 mL IVPB     1,250  mg 166.7 mL/hr over 90 Minutes Intravenous  Once 02/22/15 0656 02/22/15 0911   02/22/15 0700  ceFEPIme (MAXIPIME) 2 g in dextrose 5 % 50 mL IVPB  Status:  Discontinued     2 g 100 mL/hr over 30 Minutes Intravenous  Once 02/22/15 0656 02/22/15 0955     Assessment: 68 yom presented to the ED with leg pain and weakness and history of IVDA. Initially started on vancomycin and cefepime but changed to aztreonam d/t penicillin allergy. Pt is afebrile and WBC is elevated at 20.9. Scr is elevated at 2.7.   Vanc 12/17>> Aztreonam 12/17>>  Goal of Therapy:  Vancomycin trough level 15-20 mcg/ml  Plan:  - Aztreonam 1gm IV Q8H - Vancomycin 1250mg  IV x 1 then 500mg  IV Q12H - F/u renal fxn, C&S, clinical status and trough at [place], Rande Lawman 02/22/2015,10:56 AM

## 2015-02-22 NOTE — Progress Notes (Signed)
Orthopedic Tech Progress Note Patient Details:  Erik [name] 05/27/1995 [id number] Delivered Velcro knee immobilizer to pt.'s nurse. Ortho Devices Type of Ortho Device: Knee Immobilizer Ortho Device/Splint Location: RLE Ortho Device/Splint Interventions: Other (comment)   Darrol Poke 02/22/2015, 9:22 PM

## 2015-02-22 NOTE — CV Procedure (Signed)
Patient is refusing echocardiogram at this time.   Erik [name] [facility] 02/22/2015   8:00 AM

## 2015-02-22 NOTE — ED Notes (Signed)
Pt has the urge to void but unable to---- bladder scan reading greater than 200 ml.

## 2015-02-22 NOTE — ED Notes (Signed)
Patient still off the floor for testing.  

## 2015-02-22 NOTE — Progress Notes (Signed)
Late day addendum:  Patient's CK is 37000+.  Will add Rhabdomyolysis to problem list and increase IVF to 150 ml / hour.  Check CK in am.     Imogene Burn, PA-C [facility] Pager: [phone]

## 2015-02-22 NOTE — ED Provider Notes (Signed)
CSN: [id number]     Arrival date & time 02/22/15  0406 History   First MD Initiated Contact with Patient 02/22/15 [phone]     Chief Complaint  Patient presents with  . Leg Pain  . Hip Pain     (Consider location/radiation/quality/duration/timing/severity/associated sxs/prior Treatment) HPI  Mr. Erik [name] is a 19yo male, PMH of IVDA, here after transfer from [facility] for MRI for possible cord compression for LE weakness.  Patient states he has pain in his legs from his knees to his feet.  He can move his ankle and knee joints but not his hip joints.  This began yesterday.  He denies any trauma.  He states this has happed before and he was in the hospital for 10 days for treatment.  He denies any chest pain or SOB.  There are no further complaints.  10 Systems reviewed and are negative for acute change except as noted in the HPI.    Past Medical History  Diagnosis Date  . Sinusitis   . Anxiety   . Hepatitis B   . Drug abuse and dependence ([place])     admit with OD 11/2014: rhabdo and AKI, ARF -    Past Surgical History  Procedure Laterality Date  . No past surgeries     Family History  Problem Relation Age of Onset  . Adopted: Yes   Social History  Substance Use Topics  . Smoking status: Current Some Day Smoker  . Smokeless tobacco: None  . Alcohol Use: Yes    Review of Systems    Allergies  Penicillins; Penicillins; Sulfa antibiotics; and Sulfa antibiotics  Home Medications   Prior to Admission medications   Not on File   BP 114/76 mmHg  Pulse 72  Temp(Src) 98.5 F (36.9 C) (Oral)  Resp 17  SpO2 100% Physical Exam  Constitutional: He is oriented to person, place, and time. Vital signs are normal. He appears well-developed and well-nourished.  Non-toxic appearance. He does not appear ill. No distress.  HENT:  Head: Normocephalic and atraumatic.  Nose: Nose normal.  Mouth/Throat: Oropharynx is clear and moist. No oropharyngeal exudate.  Eyes: Conjunctivae and EOM are  normal. Pupils are equal, round, and reactive to light. No scleral icterus.  Neck: Normal range of motion. Neck supple. No tracheal deviation, no edema, no erythema and normal range of motion present. No thyroid mass and no thyromegaly present.  Cardiovascular: Normal rate, regular rhythm, S1 normal, S2 normal, normal heart sounds, intact distal pulses and normal pulses.  Exam reveals no gallop and no friction rub.   No murmur heard. Pulmonary/Chest: Effort normal and breath sounds normal. No respiratory distress. He has no wheezes. He has no rhonchi. He has no rales.  Abdominal: Soft. Normal appearance and bowel sounds are normal. He exhibits no distension, no ascites and no mass. There is no hepatosplenomegaly. There is no tenderness. There is no rebound, no guarding and no CVA tenderness.  Musculoskeletal: He exhibits no edema or tenderness.  Normal ROM at B ankle and knee joints.  No strength at bilateral hip joints.  No tenderness to palpation noted.  He has normal pulses and sensation distally.  Lymphadenopathy:    He has no cervical adenopathy.  Neurological: He is alert and oriented to person, place, and time. He has normal strength. No cranial nerve deficit or sensory deficit. He exhibits normal muscle tone.  Skin: Skin is warm, dry and intact. No petechiae and no rash noted. He is not diaphoretic. No erythema.  No pallor.  Nursing note and vitals reviewed.   ED Course  Procedures (including critical care time) Labs Review Labs Reviewed  CBC WITH DIFFERENTIAL/PLATELET - Abnormal; Notable for the following:    WBC 20.9 (*)    Neutro Abs 18.1 (*)    All other components within normal limits  BASIC METABOLIC PANEL - Abnormal; Notable for the following:    Potassium 6.1 (*)    CO2 18 (*)    Glucose, Bld 142 (*)    BUN 59 (*)    Creatinine, Ser 2.70 (*)    Calcium 7.9 (*)    GFR calc non Af Amer 32 (*)    GFR calc Af Amer 37 (*)    Anion gap 17 (*)    All other components within  normal limits  TROPONIN I - Abnormal; Notable for the following:    Troponin I 46.69 (*)    All other components within normal limits  CULTURE, BLOOD (ROUTINE X 2)  CULTURE, BLOOD (ROUTINE X 2)  CULTURE, BLOOD (SINGLE)  SEDIMENTATION RATE  C-REACTIVE PROTEIN  CK  URINALYSIS, ROUTINE W REFLEX MICROSCOPIC (NOT AT [facility])    Imaging Review No results found. I have personally reviewed and evaluated these images and lab results as part of my medical decision-making.   EKG Interpretation None      MDM   Final diagnoses:  Polysubstance abuse  IV drug abuse  Non-traumatic rhabdomyolysis  NSTEMI (non-ST elevated myocardial infarction) ([place])  Endocarditis    Patient presents to the ED for BLE weakness.  He was originally sent over for possible cord compression or mass due to IVDA.  Labs have now returned and I think rhabdo and possible endocarditis is more likely.  In addition, he has full strength distally and only has proximal muscle weakness.  Troponin is 46, CK can not be measured by the lab because it is so high per report.  Patient given 2L of IVF in the ED.  He complains of dec urination.  BS reveals 300cc in the bladder, foley placed.  Blood cultures drawn times 3.  Patient given vanc and cefepime for possible endocaridits.  I spoke with cardiology who recs for echo to be done and they do not believe he needs a cath.  They will consult and have medicine admit.  I spoke with Dr. Arnoldo Morale who agrees with plan and requests for step down bed.  CRITICAL CARE Performed by: Everlene Balls   Total critical care time: 50 minutes - NSTEMI, rhabdomyolysis, endocarditis  Critical care time was exclusive of separately billable procedures and treating other patients.  Critical care was necessary to treat or prevent imminent or life-threatening deterioration.  Critical care was time spent personally by me on the following activities: development of treatment plan with patient and/or surrogate as  well as nursing, discussions with consultants, evaluation of patient's response to treatment, examination of patient, obtaining history from patient or surrogate, ordering and performing treatments and interventions, ordering and review of laboratory studies, ordering and review of radiographic studies, pulse oximetry and re-evaluation of patient's condition.    Everlene Balls, MD 02/22/15 [phone]

## 2015-02-22 NOTE — ED Notes (Signed)
[facility] here to transport pt to [facility]. 

## 2015-02-22 NOTE — Progress Notes (Signed)
Dr's Meda Coffee and Claiborne Billings in to see; EKG completed 2 D Echo done. Heparin dced. Consent signed for heart catherization. Patient  taken to the cath lab and parents instructed to the waiting area but told to return if any questions while waiting. Was medicated x 2 for pain while here on unit. C/o pain to legs with  turning.

## 2015-02-22 NOTE — Progress Notes (Signed)
ANTICOAGULATION CONSULT NOTE - Initial Consult  Pharmacy Consult for heparin Indication: chest pain/ACS  Allergies  Allergen Reactions  . Penicillins Anaphylaxis    All "-cillins."   . Penicillins Swelling    Lips swell.    . Sulfa Antibiotics     Unknown.   . Sulfa Antibiotics Swelling    Patient Measurements:   Heparin Dosing Weight:   Vital Signs: Temp: 97.5 F (36.4 C) (12/17 1315) Temp Source: Oral (12/17 1315) BP: 126/73 mmHg (12/17 1315) Pulse Rate: 76 (12/17 1315)  Labs:  Recent Labs  02/22/15 0509 02/22/15 1202 02/22/15 1207  HGB 16.1  --   --   HCT 47.8  --   --   PLT 240  --   --   CREATININE 2.70* 2.45*  --   TROPONINI 46.69*  --  >65.00*    CrCl cannot be calculated (Unknown ideal weight.).   Medical History: Past Medical History  Diagnosis Date  . Sinusitis   . Anxiety   . Hepatitis B   . Drug abuse and dependence ([place])     admit with OD 11/2014: rhabdo and AKI, ARF -     Medications:  Infusions:  . sodium chloride 125 mL/hr at 02/22/15 0751  . heparin      Assessment: 54 yom with a history of IVDA now with elevated troponin of >65 to start IV heparin. H/H + platelets are WNL. He was not on anticoagulation PTA but he just received a dose of SQ heparin for VTE prophylaxis.   Goal of Therapy:  Heparin level 0.3-0.7 units/ml Monitor platelets by anticoagulation protocol: Yes   Plan:  - Heparin bolus 2000 units IV x 1 (lower bolus d/t recent SQ administration of heparin) - Heparin gtt 900 units/hr - Check a 6 hour heparin level - Daily heparin level and CBC  Modine Oppenheimer, Rande Lawman 02/22/2015,2:13 PM

## 2015-02-22 NOTE — ED Notes (Signed)
Patient brought in by friend with complaints of bilateral hip pain radiating down into legs. Denies fall, injury.

## 2015-02-22 NOTE — Progress Notes (Signed)
Dr. Meda Coffee on unit and results of trop greater than 65 reported.

## 2015-02-22 NOTE — Consult Note (Addendum)
CARDIOLOGY CONSULT NOTE   Patient ID: Erik [name] MRN: [id number], DOB/AGE: [birth year]   Admit date: 02/22/2015 Date of Consult: 02/22/2015 Reason for Consult: Elevated Troponin   Primary Physician: Gwendolyn Grant, MD Primary Cardiologist: New - Seen by Dr. Mare Ferrari in 11/2014 - Never seen in office   HPI: Erik [name] is a 19 y.o. male with PMH of Hepatitis and IVDA who presented to [facility] ED on 02/22/2015 for bilateral hip pain radiating down both legs. It was thought he might have possible cord compression due to his lower extremity weakness so he was transferred to [facility] for an MRI to further evaluate his symptoms.   He reports having bilateral hip pain that has been constant since onset earlier this morning and radiates down both legs. He is able to move his lower extremities. He is moaning in pain throughout the encounter. Reports his last drug use was two days ago.  He denies any recent chest pain or palpitations. Says "I do not know why Cardiology comes to see me. I am not having a heart attack. I am in pain and I don't want to talk to you". When trying to explain why cardiology is going to see him he states "I am not trying to be rude, I just do not want to talk right now".  Since in the ED, his troponin is found to be elevated to 46.69. Sed rate normal at 1. CRP is pending. K+ 6.1. Creatinine 2.70 (trending down from 3.40 on 11/07/2014). WBC elevated to 20.9.   Was recently hospitalized from 11/08/2014 - 11/16/2014 for acute hypoxic respiratory failure, septic shock, acute kidney injury, and traumatic rhabdomyolysis secondary to a heroin overdose and trauma sustained during the overdose. Troponin value was 2.34 at that time. His initial echo on 11/08/2014 showed an EF of 35-40% with diffuse hypokinesis. A repeat study was obtained 11/14/2014 and showed his LV function had returned to normal with an EF of 55-60% with normal wall motion. No evidence of endocarditis was  noted at that time.  The patient is adopted so no medical family history is known.  Problem List Past Medical History  Diagnosis Date  . Sinusitis   . Anxiety   . Hepatitis B   . Drug abuse and dependence ([place])     admit with OD 11/2014: rhabdo and AKI, ARF -     Past Surgical History  Procedure Laterality Date  . No past surgeries      Allergies  Allergies  Allergen Reactions  . Penicillins Anaphylaxis    All "-cillins."   . Penicillins Swelling    Lips swell.    . Sulfa Antibiotics     Unknown.   . Sulfa Antibiotics Swelling   Inpatient Medications  . aspirin EC  325 mg Oral Daily  . aztreonam  1 g Intravenous Q8H  . docusate sodium  100 mg Oral BID  . folic acid  1 mg Oral Daily  . heparin  5,000 Units Subcutaneous 3 times per day  . multivitamin with minerals  1 tablet Oral Daily  . sodium chloride  3 mL Intravenous Q12H  . thiamine  100 mg Oral Daily  . vancomycin  500 mg Intravenous Q12H   . sodium chloride 125 mL/hr at 02/22/15 0751   Family History Family History  Problem Relation Age of Onset  . Adopted: Yes     Social History Social History   Social History  . Marital Status: Single  Spouse Name: N/A  . Number of Children: 0  . Years of Education: N/A   Occupational History  . Not on file.   Social History Main Topics  . Smoking status: Current Some Day Smoker  . Smokeless tobacco: Not on file  . Alcohol Use: Yes  . Drug Use: Yes    Special: Marijuana, Cocaine, IV     Comment: marijuana, heroin  . Sexual Activity: Not on file     Comment: heroine   Other Topics Concern  . Not on file   Social History Narrative   ** Merged History Encounter **         Review of Systems General:  No chills, fever, night sweats or weight changes.  Cardiovascular:  No chest pain, dyspnea on exertion, edema, orthopnea, palpitations, paroxysmal nocturnal dyspnea.  Dermatological: No rash, lesions/masses Respiratory: No cough, dyspnea Urologic:  No hematuria, dysuria Abdominal:   No nausea, vomiting, diarrhea, bright red blood per rectum, melena, or hematemesis Neurologic:  No visual changes, wkns, changes in mental status. MSK: Positive for bilateral hip weakness. No weakness in lower distal extremities. All other systems reviewed and are otherwise negative except as noted above.  Physical Exam: Blood pressure 114/76, pulse 72, temperature 98.5 F (36.9 C), temperature source Oral, resp. rate 17, SpO2 100 %.  General: Young male verbally moaning throughout examination.  Psych: Normal affect. Neuro: Alert and oriented X 3. Moves lower distal extremities spontaneously. HEENT: Normal  Neck: Supple without bruits or JVD. Lungs:  Resp regular and unlabored, CTA without wheezing or rales. Heart: RRR no s3, s4, or murmurs. Abdomen: Soft, non-tender, non-distended, BS + x 4.  Extremities: No clubbing, cyanosis or edema. DP/PT/Radials 2+ and equal bilaterally. Erythematous rash along left foot. Tattoos noted.  Labs  Recent Labs  02/22/15 0509  TROPONINI 46.69*   Lab Results  Component Value Date   WBC 20.9* 02/22/2015   HGB 16.1 02/22/2015   HCT 47.8 02/22/2015   MCV 89.7 02/22/2015   PLT 240 02/22/2015    Recent Labs Lab 02/22/15 0509  NA 136  K 6.1*  CL 101  CO2 18*  BUN 59*  CREATININE 2.70*  CALCIUM 7.9*  GLUCOSE 142*    Radiology/Studies No results found.  ECG: Sinus Rhythm, rate in 80's. ST depression in inferior leads (new since previous tracing)   ECHOCARDIOGRAM: 11/08/2014 Study Conclusions:  - Left ventricle: The cavity size was normal. Wall thickness was normal. Systolic function was moderately reduced. The estimated ejection fraction was in the range of 35% to 40%. Diffuse hypokinesis. - Right ventricle: The cavity size was normal. Systolic function was mildly reduced.  ECHOCARDIOGRAM: 11/14/2014 Study Conclusions:  - Left ventricle: The cavity size was normal. Wall thickness  was normal. Systolic function was normal. The estimated ejection fraction was in the range of 55% to 60%. Wall motion was normal; there were no regional wall motion abnormalities. Left ventricular diastolic function parameters were normal.  Impressions: - Since the prior echo of 11/08/14, left ventricular systolic function has returned to normal.  ASSESSMENT AND PLAN  1. Elevated Troponin - occurring in the setting of IVDA. - recently admitted from 11/08/2014 - 11/16/2014 for acute hypoxic respiratory failure, septic shock, acute kidney injury, and traumatic rhabdomyolysis secondary to a heroin overdose and trauma sustained during the overdose. Troponin peaked at 2.34 that admission. Initial EF was 35-40% but improved to 55-60% prior to discharge. - Initial troponin today is 46.69. Cyclic values to be obtained. EKG does show new  ST depression in inferior leads. Obtain repeat EKG. - new echocardiogram is pending. He adamantly refused the test this morning due to his acute pain.  - Per Dr. Meda Coffee, will likely require a catheterization with his significantly elevated troponin values and new EKG changes. His creatinine remains elevated at 2.70 today so he would need to be appropriately hydrated prior to the procedcure. He is refusing an echocardiogram so I am not sure he would be willing to have a catheterization. MD to further discuss with the patient.  2. Possible Endocarditis - past echo in 11/2014 showed no evidence of endocarditis - Has been started on Cefepime and Vancomycin for possible endocarditis. - refused echo this AM. Will need to attempt to obtain again later today.  3. AKI - peaked at 5.24 on 11/13/2014 last admission - 2.70 on 02/22/2015  4. IVDA - according to the patient, he last used Heroin two days ago.  5. Bilateral Hip Pain  - undergoing workup by admitting team  Signed, Erma Heritage, PA-C 02/22/2015, 7:26 AM Pager: [phone]  The patient was  seen, examined and discussed with Bernerd Pho, PA-C and I agree with the above.    20 year old male with h/o admission in 11/2014 for acute hypoxic respiratory failure, septic shock, acute kidney injury, and traumatic rhabdomyolysis secondary to a heroin overdose and trauma sustained during the overdose, max troponin 2.34, LVEF 35-40% (possible stress induced cardiomyopathy) --> improved to 55-60%. Admitted today intoxicated, drug screen positive for benzo, opiates, cocaine, THC, he presented to [facility] ER with bilateral hip pain, weakness, leukocytosis, elevated CRP, he was found to have significantly elevated troponin 46 --> 65, His ECG shows ST elevations in V1, aVR, new diffuse ST depressions in the anterolateral leads, suspicious of left main disease or triple vessel disease, previously normal ECG in 11/2014. This is a critical finding, however the concern is possible endocarditis with embolization that can worsen with cath. We will obtain a stat echocardiogram. The patient is not agreeable to interventions, he refused echocardiogram this morning, he is asking for pain meds for his hip and feet pain and denies CP or SOB. This was discussed with him and his father who agrees with plan and is very supportive.  He has acute on chronic kidney failure Crea 2.7 previously 1.3 (from 5.1). GFR 38. We will start hydration. CPK 37.697 ? Rhabdomyolysis, ? Myopericarditis.  The case is being discussed with Dr Claiborne Billings - the interventionalist on call.  We will start heparin drip, retry to obtain stat echocardiogram to evaluate for wall motion abnormalities, LVEF and a possible endocarditis. The patient is agreeable to it now, He is being treated empirically with aztreonam and vancomycin for endocarditis.  We will continue asa 81 mg po daily, careful carvedilol 3.125 mg po BID as he is septic.    Erik [name] 02/22/2015  Addendum: stat bedside echocardiogram was performed bedside, LVEF estimated at 50-55%  with no regional wall motion abnormalities. No pericardial effusion, pericardium appears thickened, and bright. Valves are well visualized with no obvious endocarditis.  The case was discussed with Dr Claiborne Billings in length, talked to the patient again, the most approach seems to be to attempt a left cardiac cath with use of minimum contrast. There is a high chance that this is a case of cocaine induced vasospasm. The patient is inclined to the procedure but wants to talk to another physician. I will have Dr Claiborne Billings talk to him.  Erik [name] 02/22/2015

## 2015-02-22 NOTE — ED Notes (Signed)
Patient still off the unit for testing 

## 2015-02-22 NOTE — Progress Notes (Signed)
  Echocardiogram 2D Echocardiogram has been performed.  Erik [name] 02/22/2015, 4:16 PM

## 2015-02-22 NOTE — ED Notes (Signed)
[facility] was called of pt's transfer to [facility].

## 2015-02-22 NOTE — ED Notes (Signed)
PATIENT STILL OFF THE FLOOR FOR MRI

## 2015-02-22 NOTE — Progress Notes (Addendum)
Received patient from ED, placed on CCM, CHG bath completed. VSS.

## 2015-02-22 NOTE — ED Notes (Signed)
Patient given water per admitting MD request.

## 2015-02-23 DIAGNOSIS — E875 Hyperkalemia: Secondary | ICD-10-CM

## 2015-02-23 DIAGNOSIS — D219 Benign neoplasm of connective and other soft tissue, unspecified: Secondary | ICD-10-CM

## 2015-02-23 DIAGNOSIS — F1910 Other psychoactive substance abuse, uncomplicated: Secondary | ICD-10-CM | POA: Insufficient documentation

## 2015-02-23 DIAGNOSIS — T796XXD Traumatic ischemia of muscle, subsequent encounter: Secondary | ICD-10-CM

## 2015-02-23 DIAGNOSIS — F1410 Cocaine abuse, uncomplicated: Secondary | ICD-10-CM | POA: Insufficient documentation

## 2015-02-23 DIAGNOSIS — R7989 Other specified abnormal findings of blood chemistry: Secondary | ICD-10-CM

## 2015-02-23 LAB — BASIC METABOLIC PANEL
Anion gap: 5 (ref 5–15)
BUN: 36 mg/dL — ABNORMAL HIGH (ref 6–20)
CALCIUM: 8 mg/dL — AB (ref 8.9–10.3)
CO2: 21 mmol/L — AB (ref 22–32)
CREATININE: 2.04 mg/dL — AB (ref 0.61–1.24)
Chloride: 110 mmol/L (ref 101–111)
GFR, EST AFRICAN AMERICAN: 52 mL/min — AB (ref >60)
GFR, EST NON AFRICAN AMERICAN: 45 mL/min — AB (ref >60)
Glucose, Bld: 117 mg/dL — ABNORMAL HIGH (ref 65–99)
Potassium: 4.5 mmol/L (ref 3.5–5.1)
SODIUM: 136 mmol/L (ref 135–145)

## 2015-02-23 LAB — CBC
HCT: 39.2 % (ref 39.0–52.0)
Hemoglobin: 13.3 g/dL (ref 13.0–17.0)
MCH: 30 pg (ref 26.0–34.0)
MCHC: 33.9 g/dL (ref 30.0–36.0)
MCV: 88.5 fL (ref 78.0–100.0)
PLATELETS: 186 10*3/uL (ref 150–400)
RBC: 4.43 MIL/uL (ref 4.22–5.81)
RDW: 12.5 % (ref 11.5–15.5)
WBC: 10.5 10*3/uL (ref 4.0–10.5)

## 2015-02-23 LAB — LIPID PANEL
CHOL/HDL RATIO: 4.3 ratio
Cholesterol: 138 mg/dL (ref 0–200)
HDL: 32 mg/dL — AB (ref >40)
LDL CALC: 71 mg/dL (ref 0–99)
TRIGLYCERIDES: 173 mg/dL — AB (ref <150)
VLDL: 35 mg/dL (ref 0–40)

## 2015-02-23 LAB — CK: CK TOTAL: 22790 U/L — AB (ref 49–397)

## 2015-02-23 MED ORDER — HEPARIN SODIUM (PORCINE) 5000 UNIT/ML IJ SOLN
5000.0000 [IU] | Freq: Three times a day (TID) | INTRAMUSCULAR | Status: DC
  Administered 2015-02-23 – 2015-02-26 (×8): 5000 [IU] via SUBCUTANEOUS
  Filled 2015-02-23 (×8): qty 1

## 2015-02-23 MED ORDER — LORAZEPAM 2 MG/ML IJ SOLN
1.0000 mg | Freq: Four times a day (QID) | INTRAMUSCULAR | Status: DC | PRN
  Administered 2015-02-25: 1 mg via INTRAVENOUS
  Filled 2015-02-23 (×2): qty 1

## 2015-02-23 MED ORDER — SODIUM CHLORIDE 0.9 % IV SOLN: INTRAVENOUS | Status: DC

## 2015-02-23 MED ORDER — SENNOSIDES-DOCUSATE SODIUM 8.6-50 MG PO TABS
2.0000 | ORAL_TABLET | Freq: Every day | ORAL | Status: DC
  Administered 2015-02-23 – 2015-02-25 (×3): 2 via ORAL
  Filled 2015-02-23 (×3): qty 2

## 2015-02-23 MED ORDER — TRAZODONE HCL 50 MG PO TABS
25.0000 mg | ORAL_TABLET | Freq: Once | ORAL | Status: AC
  Administered 2015-02-23: 25 mg via ORAL
  Filled 2015-02-23: qty 1

## 2015-02-23 MED ORDER — SODIUM CHLORIDE 0.9 % IV SOLN
INTRAVENOUS | Status: DC
  Administered 2015-02-23: 13:00:00 via INTRAVENOUS
  Administered 2015-02-24: 75 mL/h via INTRAVENOUS

## 2015-02-23 MED ORDER — CARVEDILOL 3.125 MG PO TABS
3.1250 mg | ORAL_TABLET | Freq: Two times a day (BID) | ORAL | Status: DC
  Administered 2015-02-23 – 2015-02-26 (×3): 3.125 mg via ORAL
  Filled 2015-02-23 (×5): qty 1

## 2015-02-23 MED ORDER — NICOTINE 21 MG/24HR TD PT24
21.0000 mg | MEDICATED_PATCH | Freq: Every day | TRANSDERMAL | Status: DC
  Administered 2015-02-23 – 2015-02-25 (×4): 21 mg via TRANSDERMAL
  Filled 2015-02-23 (×4): qty 1

## 2015-02-23 MED ORDER — TRAZODONE HCL 50 MG PO TABS
50.0000 mg | ORAL_TABLET | Freq: Every evening | ORAL | Status: DC | PRN
  Administered 2015-02-23 – 2015-02-25 (×3): 50 mg via ORAL
  Filled 2015-02-23 (×3): qty 1

## 2015-02-23 NOTE — Progress Notes (Addendum)
Patient Name: Erik [name] Date of Encounter: 02/23/2015  Principal Problem:   Lower extremity pain, bilateral Active Problems:   IVDU (intravenous drug user)   Hyperkalemia   Elevated troponin   Leukocytosis   Acute renal failure due to rhabdomyolysis [facility])   Rhabdomyolysis   NSTEMI (non-ST elevated myocardial infarction) ([place])   Length of Stay: 1  SUBJECTIVE  The patient is somnolent, denies chest pain or SOB, he continues to have LE and hip pain.  CURRENT MEDS . aspirin EC  81 mg Oral Daily  . aztreonam  1 g Intravenous Q8H  . carvedilol  3.125 mg Oral BID WC  . Chlorhexidine Gluconate Cloth  6 each Topical Q0600  . folic acid  1 mg Oral Daily  . multivitamin with minerals  1 tablet Oral Daily  . mupirocin ointment  1 application Nasal BID  . nicotine  21 mg Transdermal Daily  . senna-docusate  2 tablet Oral QHS  . sodium chloride  3 mL Intravenous Q12H  . sodium chloride  3 mL Intravenous Q12H  . thiamine  100 mg Oral Daily  . vancomycin  500 mg Intravenous Q12H    OBJECTIVE  Filed Vitals:   02/23/15 0130 02/23/15 0200 02/23/15 0400 02/23/15 0813  BP: 127/76 129/80 131/79 130/81  Pulse: 80 76 81 74  Temp:   99.1 F (37.3 C) 99.1 F (37.3 C)  TempSrc:   Oral Oral  Resp: 12 10 12 11   Height:      Weight:      SpO2: 96% 97% 96% 97%    Intake/Output Summary (Last 24 hours) at 02/23/15 1125 Last data filed at 02/23/15 1015  Gross per 24 hour  Intake 3301.67 ml  Output   3940 ml  Net -638.33 ml   Filed Weights   02/22/15 2330  Weight: 139 lb 1.8 oz (63.1 kg)    PHYSICAL EXAM  General: Pleasant, NAD. Neuro: Alert and oriented X 3. Moves all extremities spontaneously. Psych: Normal affect. HEENT:  Normal  Neck: Supple without bruits or JVD. Lungs:  Resp regular and unlabored, CTA. Heart: RRR no s3, s4, or murmurs. Abdomen: Soft, non-tender, non-distended, BS + x 4.  Extremities: No clubbing, cyanosis or edema. DP/PT/Radials 2+ and equal  bilaterally.  Accessory Clinical Findings  CBC  Recent Labs  02/22/15 0509 02/23/15 0406  WBC 20.9* 10.5  NEUTROABS 18.1*  --   HGB 16.1 13.3  HCT 47.8 39.2  MCV 89.7 88.5  PLT 240 99991111   Basic Metabolic Panel  Recent Labs  02/22/15 1202 02/23/15 0406  NA 135 136  K 4.7 4.5  CL 105 110  CO2 20* 21*  GLUCOSE 77 117*  BUN 55* 36*  CREATININE 2.45* 2.04*  CALCIUM 8.1* 8.0*    Recent Labs  02/22/15 1207 02/22/15 1925 02/22/15 2145 02/23/15 0406  CKTOTAL 60454*  --   --  22790*  TROPONINI >65.00* >65.00* 61.88*  --     Recent Labs  02/23/15 0406  CHOL 138  HDL 32*  LDLCALC 71  TRIG 173*  CHOLHDL 4.3   Radiology/Studies  Dg Chest 2 View  02/22/2015  CLINICAL DATA:  Shortness of Breath EXAM: CHEST - 2 VIEW COMPARISON:  None. FINDINGS: The heart size and mediastinal contours are within normal limits. Both lungs are clear. The visualized skeletal structures are unremarkable. IMPRESSION: No active disease. Electronically Signed   By: Inez Catalina M.D.   On: 02/22/2015 08:54   Mr Thoracic Spine W  Wo Contrast  02/22/2015  CLINICAL DATA:  19 year old with recent hospitalization for narcotic induced respiratory failure. Patient awoke this morning with severe foot pain, bilateral leg weakness and inability to urinate. History of hepatitis IMPRESSION: 1. Negative MRI of the thoracolumbar spine. No evidence of disc herniation, spinal stenosis, discitis or osteomyelitis. The thoracic cord and conus medullaris appear normal. 2. Concern of patchy airspace opacities at both lung bases, worrisome for early aspiration in this clinical context. Chest radiographic follow up recommended. 3. T2 hyperintensity and enhancement in the right gluteus musculature, incompletely visualized. In this clinical context, this could indicate rhabdomyolysis. Correlate clinically. Follow up CK levels recommended.   Dg Foot Complete Left  02/22/2015  CLINICAL DATA:  Bilateral foot pain .  IMPRESSION: No acute abnormality noted. Electronically   Dg Foot Complete Right  02/22/2015  CLINICAL DATA:  Bilateral foot pain  IMPRESSION: No acute abnormality noted. Electronically Signed   TELE:   ECG: SR, Q wave in V1-3, minimal STE in V1-2, improved STD in V4-6     ASSESSMENT AND PLAN  1. Elevated Troponin > 65, with CPK 38.000, possible myocytis - occurring in the setting of IVDA. - recently admitted from 11/08/2014 - 11/16/2014 for acute hypoxic respiratory failure, septic shock, acute kidney injury, and traumatic rhabdomyolysis secondary to a heroin overdose and trauma sustained during the overdose. Troponin peaked at 2.34 that admission. Initial EF was 35-40% but improved to 55-60% prior to discharge.  - cath normal yesterday, most probably cocaine induced vasospasm, LVEF preserved this time, Today new Q wave in the anterior lead, we will repeat echo in 4-6 weeks.  2. Possible Endocarditis - past echo in 11/2014 and yesterday showed no evidence of endocarditis - Has been started on Aztreonam and Vancomycin for possible endocarditis. - leukocytosis improved this am - his LE erythema is becoming more swollen and warm, we wil folow, there are no splnter hemorrhages or petechiae - we will schedule TEE for tomorrow  3. AKI - peaked at 5.24 on 11/13/2014 last admission - 2.70 --> 2.0 today  4. IVDA - according to the patient, he last used Heroin two days ago, cocaine the night of admission, drug screen also positice for benzodiazepines and marijuana.  5. Bilateral Hip Pain, feet pain - negative X rays  6. Rhabdomyalysis with CPK 38.000--> 23.000 today, we will hydrate and follow  Signed, Dorothy Spark MD, [facility] 02/23/2015

## 2015-02-23 NOTE — Progress Notes (Signed)
PATIENT DETAILS Name: Erik [name] Age: 19 y.o. Sex: male Date of Birth: [birth year] Admit Date: 02/22/2015 Admitting Physician Ripudeep Krystal Eaton, MD LX:[id number] Asa Lente, MD  Subjective: Feels slightly better than yesterday-pain now mostly located on the plantar aspect of bilateral feet. Able to move/lift both feet and legs off the bed. Denies any chest pain.  Assessment/Plan: Principal Problem: NSTEMI: Secondary to cocaine-induced vasospasm. Underwent LHC on 12/17 which showed normal coronary arteries. Continue aspirin, Coreg. Cardiology following.  Active Problems: Lower extremity pain/weakness: Suspect secondary to rhabdomyolysis, could have developed a neuropathy from drug use. MRI thoracic or lumbar spine negative for any acute abnormalities. Continue to hydrate, supportive care.  Rhabdomyolysis: Suspect that this is nontraumatic and nonexertional-likely from cocaine/heroin use. Continue with hydration, trend CK.   Acute urinary retention: Suspect from pain, no major abnormality seen on MRI spine. Continue Foley for catheter for 1 more day, voiding trial tomorrow   Leukocytosis:  MRI thoracic or lumbar spine negative for discitis/abscess. Blood cultures negative 1 day. Leukocytosis has significantly improved-could have been reactive to rhabdomyolysis. Continue empiric antibiotics for 1 more day, if blood cultures continued to be negative, suspect we could discontinue all antibiotics.  ARF:  likely secondary to rhabdomyolysis/acute urinary retention. Creatinine downtrending with IV fluids. Follow.  Hyperkalemia: Resolved, likely secondary to rhabdomyolysis/renal failure   Polysubstance abuse/IVDA:  Counseled-however felt embarrassed and thought I was condescending. Refuses psychiatric evaluation. Will consult social work for further counseling.  Disposition: Remain inpatient-remain in SDU for 24 hours  Antimicrobial agents  See below  Anti-infectives     Start     Dose/Rate Route Frequency Ordered Stop   02/22/15 2000  vancomycin (VANCOCIN) 500 mg in sodium chloride 0.9 % 100 mL IVPB     500 mg 100 mL/hr over 60 Minutes Intravenous Every 12 hours 02/22/15 1055     02/22/15 1900  vancomycin (VANCOCIN) 500 mg in sodium chloride 0.9 % 100 mL IVPB  Status:  Discontinued     500 mg 100 mL/hr over 60 Minutes Intravenous Every 12 hours 02/22/15 0745 02/22/15 0749   02/22/15 1030  aztreonam (AZACTAM) 1 g in dextrose 5 % 50 mL IVPB     1 g 100 mL/hr over 30 Minutes Intravenous Every 8 hours 02/22/15 1010     02/22/15 0700  vancomycin (VANCOCIN) 1,250 mg in sodium chloride 0.9 % 250 mL IVPB     1,250 mg 166.7 mL/hr over 90 Minutes Intravenous  Once 02/22/15 0656 02/22/15 0911   02/22/15 0700  ceFEPIme (MAXIPIME) 2 g in dextrose 5 % 50 mL IVPB  Status:  Discontinued     2 g 100 mL/hr over 30 Minutes Intravenous  Once 02/22/15 0656 02/22/15 0955      DVT Prophylaxis: Prophylactic Heparin  Code Status: Full code   Family Communication None at bedside  Procedures: 12/17>>LHC  CONSULTS:  cardiology  Time spent 30 minutes-Greater than 50% of this time was spent in counseling, explanation of diagnosis, planning of further management, and coordination of care.  MEDICATIONS: Scheduled Meds: . aspirin EC  81 mg Oral Daily  . aztreonam  1 g Intravenous Q8H  . carvedilol  3.125 mg Oral BID WC  . Chlorhexidine Gluconate Cloth  6 each Topical Q0600  . folic acid  1 mg Oral Daily  . multivitamin with minerals  1 tablet Oral Daily  . mupirocin ointment  1 application Nasal BID  .  nicotine  21 mg Transdermal Daily  . senna-docusate  2 tablet Oral QHS  . sodium chloride  3 mL Intravenous Q12H  . sodium chloride  3 mL Intravenous Q12H  . thiamine  100 mg Oral Daily  . vancomycin  500 mg Intravenous Q12H   Continuous Infusions: . sodium chloride 150 mL/hr at 02/23/15 0800   PRN Meds:.sodium chloride, acetaminophen, alum & mag  hydroxide-simeth, HYDROmorphone (DILAUDID) injection, LORazepam, ondansetron (ZOFRAN) IV, oxyCODONE, sodium chloride, traZODone    PHYSICAL EXAM: Vital signs in last 24 hours: Filed Vitals:   02/23/15 0130 02/23/15 0200 02/23/15 0400 02/23/15 0813  BP: 127/76 129/80 131/79 130/81  Pulse: 80 76 81 74  Temp:   99.1 F (37.3 C) 99.1 F (37.3 C)  TempSrc:   Oral Oral  Resp: 12 10 12 11   Height:      Weight:      SpO2: 96% 97% 96% 97%    Weight change:  Filed Weights   02/22/15 2330  Weight: 63.1 kg (139 lb 1.8 oz)   Body mass index is 22.46 kg/(m^2).   Gen Exam: Awake and alert with clear speech.   Neck: Supple, No JVD.   Chest: B/L Clear.   CVS: S1 S2 Regular, no murmurs.  Abdomen: soft, BS +, non tender, non distended.  Extremities: no edema, lower extremities warm to touch.Mild tender to palpation in plantar surface of both his feet Neurologic: Lower ext 4/5 bilaterally-sensation is intact   Skin: No Rash.   Wounds: N/A.   Intake/Output from previous day:  Intake/Output Summary (Last 24 hours) at 02/23/15 1125 Last data filed at 02/23/15 1015  Gross per 24 hour  Intake 3301.67 ml  Output   3940 ml  Net -638.33 ml     LAB RESULTS: CBC  Recent Labs Lab 02/22/15 0509 02/23/15 0406  WBC 20.9* 10.5  HGB 16.1 13.3  HCT 47.8 39.2  PLT 240 186  MCV 89.7 88.5  MCH 30.2 30.0  MCHC 33.7 33.9  RDW 12.4 12.5  LYMPHSABS 1.8  --   MONOABS 0.9  --   EOSABS 0.0  --   BASOSABS 0.0  --     Chemistries   Recent Labs Lab 02/22/15 0509 02/22/15 1202 02/23/15 0406  NA 136 135 136  K 6.1* 4.7 4.5  CL 101 105 110  CO2 18* 20* 21*  GLUCOSE 142* 77 117*  BUN 59* 55* 36*  CREATININE 2.70* 2.45* 2.04*  CALCIUM 7.9* 8.1* 8.0*    CBG: No results for input(s): GLUCAP in the last 168 hours.  GFR Estimated Creatinine Clearance: 51.1 mL/min (by C-G formula based on Cr of 2.04).  Coagulation profile No results for input(s): INR, PROTIME in the last 168  hours.  Cardiac Enzymes  Recent Labs Lab 02/22/15 1207 02/22/15 1925 02/22/15 2145  TROPONINI >65.00* >65.00* 61.88*    Invalid input(s): POCBNP No results for input(s): DDIMER in the last 72 hours. No results for input(s): HGBA1C in the last 72 hours.  Recent Labs  02/23/15 0406  CHOL 138  HDL 32*  LDLCALC 71  TRIG 173*  CHOLHDL 4.3   No results for input(s): TSH, T4TOTAL, T3FREE, THYROIDAB in the last 72 hours.  Invalid input(s): FREET3 No results for input(s): VITAMINB12, FOLATE, FERRITIN, TIBC, IRON, RETICCTPCT in the last 72 hours. No results for input(s): LIPASE, AMYLASE in the last 72 hours.  Urine Studies No results for input(s): UHGB, CRYS in the last 72 hours.  Invalid input(s): UACOL, UAPR, USPG,  UPH, UTP, UGL, UKET, UBIL, UNIT, UROB, ULEU, UEPI, UWBC, URBC, UBAC, CAST, UCOM, BILUA  MICROBIOLOGY: Recent Results (from the past 240 hour(s))  Blood culture (routine x 2)     Status: None (Preliminary result)   Collection Time: 02/22/15  5:10 AM  Result Value Ref Range Status   Specimen Description BLOOD RIGHT FOREARM  Final   Special Requests BOTTLES DRAWN AEROBIC AND ANAEROBIC 5ML  Final   Culture   Final    NO GROWTH 1 DAY Performed at [facility]    Report Status PENDING  Incomplete  Blood culture (routine x 2)     Status: None (Preliminary result)   Collection Time: 02/22/15  5:20 AM  Result Value Ref Range Status   Specimen Description BLOOD LEFT HAND  Final   Special Requests IN PEDIATRIC BOTTLE 2ML  Final   Culture   Final    NO GROWTH 1 DAY Performed at [facility]    Report Status PENDING  Incomplete  Culture, blood (single)     Status: None (Preliminary result)   Collection Time: 02/22/15  7:46 AM  Result Value Ref Range Status   Specimen Description BLOOD RIGHT HAND  Final   Special Requests BOTTLES DRAWN AEROBIC AND ANAEROBIC 5CC  Final   Culture NO GROWTH 1 DAY  Final   Report Status PENDING  Incomplete  MRSA PCR  Screening     Status: Abnormal   Collection Time: 02/22/15  5:05 PM  Result Value Ref Range Status   MRSA by PCR POSITIVE (A) NEGATIVE Final    Comment:        The GeneXpert MRSA Assay (FDA approved for NASAL specimens only), is one component of a comprehensive MRSA colonization surveillance program. It is not intended to diagnose MRSA infection nor to guide or monitor treatment for MRSA infections. RESULT CALLED TO, READ BACK BY AND VERIFIED WITH: J.PERKINS,RN 02/22/15 @2010  BY V.WILKINS     RADIOLOGY STUDIES/RESULTS: Dg Chest 2 View  02/22/2015  CLINICAL DATA:  Shortness of Breath EXAM: CHEST - 2 VIEW COMPARISON:  None. FINDINGS: The heart size and mediastinal contours are within normal limits. Both lungs are clear. The visualized skeletal structures are unremarkable. IMPRESSION: No active disease. Electronically Signed   By: Inez Catalina M.D.   On: 02/22/2015 08:54   Mr Thoracic Spine W Wo Contrast  02/22/2015  CLINICAL DATA:  19 year old with recent hospitalization for narcotic induced respiratory failure. Patient awoke this morning with severe foot pain, bilateral leg weakness and inability to urinate. History of hepatitis B. EXAM: MRI THORACIC AND LUMBAR SPINE WITHOUT AND WITH CONTRAST TECHNIQUE: Multiplanar and multiecho pulse sequences of the thoracic and lumbar spine were obtained without and with intravenous contrast. CONTRAST:  45mL MULTIHANCE GADOBENATE DIMEGLUMINE 529 MG/ML IV SOLN COMPARISON:  CT of the chest, abdomen and pelvis 02/27/2014. Chest radiographs today. FINDINGS: MR THORACIC SPINE FINDINGS Alignment:  Normal. Bones: No acute or suspicious osseous findings. There is no abnormal osseous or endplate enhancement. Cord: The thoracic cord appears normal in signal and caliber. No abnormal intradural enhancement. Paraspinal and other soft tissues: No significant paraspinal abnormalities. There is concern of patchy airspace disease at both lung bases, worse on the left. No  significant pleural effusion. Disc levels: All of the thoracic discs appear normal. There is no evidence of disc herniation, spinal stenosis or nerve root encroachment. There is no evidence of discitis. MR LUMBAR SPINE FINDINGS Segmentation: Conventional anatomy assumed, with the last open disc space designated  L5-S1. Alignment:  Normal. Bones: No worrisome osseous lesion, acute fracture or pars defect. No evidence of discitis or osteomyelitis. The lumbar pedicles are somewhat short on a congenital basis. The sacroiliac joints appear normal. Conus medullaris: Extends to the L1 level and appears normal. There is no abnormal intradural enhancement. Paraspinal and other soft tissues: No significant paraspinal findings. On the lower axial images, there is asymmetric T2 hyperintensity throughout the right gluteus musculature. There is also asymmetric enhancement in this area on the post-contrast images. No focal fluid collection identified. Disc levels: All of the lumbar discs are well hydrated. There is no disc herniation, spinal stenosis or nerve root encroachment. No evidence of discitis or osteomyelitis. IMPRESSION: 1. Negative MRI of the thoracolumbar spine. No evidence of disc herniation, spinal stenosis, discitis or osteomyelitis. The thoracic cord and conus medullaris appear normal. 2. Concern of patchy airspace opacities at both lung bases, worrisome for early aspiration in this clinical context. Chest radiographic follow up recommended. 3. T2 hyperintensity and enhancement in the right gluteus musculature, incompletely visualized. In this clinical context, this could indicate rhabdomyolysis. Correlate clinically. Follow up CK levels recommended. Electronically Signed   By: Richardean Sale M.D.   On: 02/22/2015 11:01   Mr Lumbar Spine W Wo Contrast  02/22/2015  CLINICAL DATA:  19 year old with recent hospitalization for narcotic induced respiratory failure. Patient awoke this morning with severe foot pain,  bilateral leg weakness and inability to urinate. History of hepatitis B. EXAM: MRI THORACIC AND LUMBAR SPINE WITHOUT AND WITH CONTRAST TECHNIQUE: Multiplanar and multiecho pulse sequences of the thoracic and lumbar spine were obtained without and with intravenous contrast. CONTRAST:  51mL MULTIHANCE GADOBENATE DIMEGLUMINE 529 MG/ML IV SOLN COMPARISON:  CT of the chest, abdomen and pelvis 02/27/2014. Chest radiographs today. FINDINGS: MR THORACIC SPINE FINDINGS Alignment:  Normal. Bones: No acute or suspicious osseous findings. There is no abnormal osseous or endplate enhancement. Cord: The thoracic cord appears normal in signal and caliber. No abnormal intradural enhancement. Paraspinal and other soft tissues: No significant paraspinal abnormalities. There is concern of patchy airspace disease at both lung bases, worse on the left. No significant pleural effusion. Disc levels: All of the thoracic discs appear normal. There is no evidence of disc herniation, spinal stenosis or nerve root encroachment. There is no evidence of discitis. MR LUMBAR SPINE FINDINGS Segmentation: Conventional anatomy assumed, with the last open disc space designated L5-S1. Alignment:  Normal. Bones: No worrisome osseous lesion, acute fracture or pars defect. No evidence of discitis or osteomyelitis. The lumbar pedicles are somewhat short on a congenital basis. The sacroiliac joints appear normal. Conus medullaris: Extends to the L1 level and appears normal. There is no abnormal intradural enhancement. Paraspinal and other soft tissues: No significant paraspinal findings. On the lower axial images, there is asymmetric T2 hyperintensity throughout the right gluteus musculature. There is also asymmetric enhancement in this area on the post-contrast images. No focal fluid collection identified. Disc levels: All of the lumbar discs are well hydrated. There is no disc herniation, spinal stenosis or nerve root encroachment. No evidence of discitis  or osteomyelitis. IMPRESSION: 1. Negative MRI of the thoracolumbar spine. No evidence of disc herniation, spinal stenosis, discitis or osteomyelitis. The thoracic cord and conus medullaris appear normal. 2. Concern of patchy airspace opacities at both lung bases, worrisome for early aspiration in this clinical context. Chest radiographic follow up recommended. 3. T2 hyperintensity and enhancement in the right gluteus musculature, incompletely visualized. In this clinical context, this could indicate  rhabdomyolysis. Correlate clinically. Follow up CK levels recommended. Electronically Signed   By: Richardean Sale M.D.   On: 02/22/2015 11:01   Dg Foot Complete Left  02/22/2015  CLINICAL DATA:  Bilateral foot pain EXAM: LEFT FOOT - COMPLETE 3+ VIEW COMPARISON:  None. FINDINGS: There is no evidence of fracture or dislocation. There is no evidence of arthropathy or other focal bone abnormality. Soft tissues are unremarkable. IMPRESSION: No acute abnormality noted. Electronically Signed   By: Inez Catalina M.D.   On: 02/22/2015 08:55   Dg Foot Complete Right  02/22/2015  CLINICAL DATA:  Bilateral foot pain EXAM: RIGHT FOOT COMPLETE - 3+ VIEW COMPARISON:  None. FINDINGS: There is no evidence of fracture or dislocation. There is no evidence of arthropathy or other focal bone abnormality. Soft tissues are unremarkable. IMPRESSION: No acute abnormality noted. Electronically Signed   By: Inez Catalina M.D.   On: 02/22/2015 08:55    Oren Binet, MD  [facility] Pager:[phone]  If 7PM-7AM, please contact night-coverage [email or web address] Password [email or web address] 02/23/2015, 11:25 AM   LOS: 1 day

## 2015-02-24 ENCOUNTER — Encounter (HOSPITAL_COMMUNITY)
Admission: EM | Disposition: A | Discharge status: Home / Self Care | Payer: Self-pay | Source: Home / Self Care | Attending: Internal Medicine

## 2015-02-24 ENCOUNTER — Encounter (HOSPITAL_COMMUNITY): Payer: Self-pay | Admitting: Cardiovascular Disease

## 2015-02-24 ENCOUNTER — Inpatient Hospital Stay (HOSPITAL_COMMUNITY): Payer: 59

## 2015-02-24 DIAGNOSIS — M79605 Pain in left leg: Secondary | ICD-10-CM

## 2015-02-24 DIAGNOSIS — M79604 Pain in right leg: Secondary | ICD-10-CM

## 2015-02-24 DIAGNOSIS — I340 Nonrheumatic mitral (valve) insufficiency: Secondary | ICD-10-CM

## 2015-02-24 HISTORY — PX: TEE WITHOUT CARDIOVERSION: SHX5443

## 2015-02-24 LAB — PROCALCITONIN: Procalcitonin: 7.53 ng/mL

## 2015-02-24 LAB — CBC
HEMATOCRIT: 41.4 % (ref 39.0–52.0)
HEMOGLOBIN: 13.9 g/dL (ref 13.0–17.0)
MCH: 30 pg (ref 26.0–34.0)
MCHC: 33.6 g/dL (ref 30.0–36.0)
MCV: 89.2 fL (ref 78.0–100.0)
Platelets: 204 10*3/uL (ref 150–400)
RBC: 4.64 MIL/uL (ref 4.22–5.81)
RDW: 12.2 % (ref 11.5–15.5)
WBC: 7.8 10*3/uL (ref 4.0–10.5)

## 2015-02-24 LAB — COMPREHENSIVE METABOLIC PANEL
ALK PHOS: 56 U/L (ref 38–126)
ALT: 246 U/L — AB (ref 17–63)
ANION GAP: 7 (ref 5–15)
AST: 415 U/L — ABNORMAL HIGH (ref 15–41)
Albumin: 2.8 g/dL — ABNORMAL LOW (ref 3.5–5.0)
BILIRUBIN TOTAL: 0.8 mg/dL (ref 0.3–1.2)
BUN: 13 mg/dL (ref 6–20)
CALCIUM: 8.8 mg/dL — AB (ref 8.9–10.3)
CO2: 26 mmol/L (ref 22–32)
CREATININE: 1.4 mg/dL — AB (ref 0.61–1.24)
Chloride: 107 mmol/L (ref 101–111)
Glucose, Bld: 113 mg/dL — ABNORMAL HIGH (ref 65–99)
Potassium: 4.1 mmol/L (ref 3.5–5.1)
SODIUM: 140 mmol/L (ref 135–145)
TOTAL PROTEIN: 5.7 g/dL — AB (ref 6.5–8.1)

## 2015-02-24 LAB — POCT ACTIVATED CLOTTING TIME: Activated Clotting Time: 152 seconds

## 2015-02-24 LAB — URINE CULTURE: CULTURE: NO GROWTH

## 2015-02-24 LAB — CK: CK TOTAL: 9490 U/L — AB (ref 49–397)

## 2015-02-24 SURGERY — ECHOCARDIOGRAM, TRANSESOPHAGEAL
Anesthesia: Moderate Sedation

## 2015-02-24 MED ORDER — MIDAZOLAM HCL 5 MG/ML IJ SOLN
INTRAMUSCULAR | Status: AC
  Filled 2015-02-24: qty 2

## 2015-02-24 MED ORDER — DIPHENHYDRAMINE HCL 50 MG/ML IJ SOLN
INTRAMUSCULAR | Status: DC | PRN
  Administered 2015-02-24: 50 mg via INTRAVENOUS

## 2015-02-24 MED ORDER — FENTANYL CITRATE (PF) 100 MCG/2ML IJ SOLN
INTRAMUSCULAR | Status: DC | PRN
  Administered 2015-02-24: 50 ug via INTRAVENOUS
  Administered 2015-02-24: 25 ug via INTRAVENOUS
  Administered 2015-02-24 (×3): 50 ug via INTRAVENOUS
  Administered 2015-02-24: 25 ug via INTRAVENOUS

## 2015-02-24 MED ORDER — FENTANYL CITRATE (PF) 100 MCG/2ML IJ SOLN
INTRAMUSCULAR | Status: AC
  Filled 2015-02-24: qty 4

## 2015-02-24 MED ORDER — LIDOCAINE VISCOUS 2 % MT SOLN
OROMUCOSAL | Status: DC | PRN
  Administered 2015-02-24: 1 via OROMUCOSAL

## 2015-02-24 MED ORDER — DIPHENHYDRAMINE HCL 50 MG/ML IJ SOLN
INTRAMUSCULAR | Status: AC
  Filled 2015-02-24: qty 1

## 2015-02-24 MED ORDER — FENTANYL CITRATE (PF) 100 MCG/2ML IJ SOLN
INTRAMUSCULAR | Status: AC
  Filled 2015-02-24: qty 2

## 2015-02-24 MED ORDER — LIDOCAINE VISCOUS 2 % MT SOLN
OROMUCOSAL | Status: AC
  Filled 2015-02-24: qty 15

## 2015-02-24 MED ORDER — MIDAZOLAM HCL 10 MG/2ML IJ SOLN
INTRAMUSCULAR | Status: DC | PRN
  Administered 2015-02-24 (×2): 2 mg via INTRAVENOUS
  Administered 2015-02-24: 1 mg via INTRAVENOUS
  Administered 2015-02-24 (×2): 2 mg via INTRAVENOUS
  Administered 2015-02-24: 3 mg via INTRAVENOUS

## 2015-02-24 MED ORDER — MIDAZOLAM HCL 5 MG/ML IJ SOLN
INTRAMUSCULAR | Status: AC
  Filled 2015-02-24: qty 1

## 2015-02-24 MED FILL — Verapamil HCl IV Soln 2.5 MG/ML: INTRAVENOUS | Qty: 2 | Status: AC

## 2015-02-24 NOTE — Progress Notes (Signed)
PATIENT DETAILS Name: Erik [name] Age: 19 y.o. Sex: male Date of Birth: 20-Nov-1995 Admit Date: 02/22/2015 Admitting Physician Ripudeep Krystal Eaton, MD LX:[id number] Asa Lente, MD  Subjective: Left leg pain better-right leg pain improving, erythematous area in right plantar surface appears stable  Assessment/Plan: Principal Problem: NSTEMI: Secondary to cocaine-induced vasospasm. Underwent LHC on 12/17 which showed normal coronary arteries. Continue aspirin, Coreg. Cardiology following.  Active Problems: Lower extremity pain/weakness: Suspect secondary to rhabdomyolysis/cellulitis, could have developed a neuropathy from drug use. MRI thoracic or lumbar spine negative for any acute abnormalities. Continue to hydrate, supportive care.  Rhabdomyolysis: Suspect that this is nontraumatic and nonexertional-likely from cocaine/heroin use. Continue with hydration, CK  downtrending.   Acute urinary retention: Suspect from pain, no major abnormality seen on MRI spine.  Foley catheter inserted on admission, will attempt voiding trial today.  Leukocytosis:  MRI thoracic or lumbar spine negative for discitis/abscess. Blood cultures negative 1 day. Leukocytosis has significantly improved-could have been reactive to rhabdomyolysis or from cellulitis of right foot. Continue Vancomycin, will d/c Azactam as blood cultures negative.  Cellulitis Right Foot plantar surface/Left foot mid dorsum: right foot much improved, left foot stable-still with erythema in the right plantar surface-but stable-see above regarding Abx  ARF:  likely secondary to rhabdomyolysis/acute urinary retention. Creatinine downtrending with IV fluids. Follow.  Hyperkalemia: Resolved, likely secondary to rhabdomyolysis/renal failure   Polysubstance abuse/IVDA:  Counseled-however felt embarrassed and thought I was condescending. Refuses psychiatric evaluation. Will consult social work for further  counseling.  Disposition: Remain inpatient-transfer to tele  Antimicrobial agents  See below  Anti-infectives    Start     Dose/Rate Route Frequency Ordered Stop   02/22/15 2000  vancomycin (VANCOCIN) 500 mg in sodium chloride 0.9 % 100 mL IVPB     500 mg 100 mL/hr over 60 Minutes Intravenous Every 12 hours 02/22/15 1055     02/22/15 1900  vancomycin (VANCOCIN) 500 mg in sodium chloride 0.9 % 100 mL IVPB  Status:  Discontinued     500 mg 100 mL/hr over 60 Minutes Intravenous Every 12 hours 02/22/15 0745 02/22/15 0749   02/22/15 1030  aztreonam (AZACTAM) 1 g in dextrose 5 % 50 mL IVPB     1 g 100 mL/hr over 30 Minutes Intravenous Every 8 hours 02/22/15 1010     02/22/15 0700  vancomycin (VANCOCIN) 1,250 mg in sodium chloride 0.9 % 250 mL IVPB     1,250 mg 166.7 mL/hr over 90 Minutes Intravenous  Once 02/22/15 0656 02/22/15 0911   02/22/15 0700  ceFEPIme (MAXIPIME) 2 g in dextrose 5 % 50 mL IVPB  Status:  Discontinued     2 g 100 mL/hr over 30 Minutes Intravenous  Once 02/22/15 0656 02/22/15 0955      DVT Prophylaxis: Prophylactic Heparin  Code Status: Full code   Family Communication None at bedside  Procedures: 12/17>>LHC  CONSULTS:  cardiology  Time spent 30 minutes-Greater than 50% of this time was spent in counseling, explanation of diagnosis, planning of further management, and coordination of care.  MEDICATIONS: Scheduled Meds: . aspirin EC  81 mg Oral Daily  . aztreonam  1 g Intravenous Q8H  . carvedilol  3.125 mg Oral BID WC  . Chlorhexidine Gluconate Cloth  6 each Topical Q0600  . folic acid  1 mg Oral Daily  . heparin subcutaneous  5,000 Units Subcutaneous 3 times per day  . multivitamin with  minerals  1 tablet Oral Daily  . mupirocin ointment  1 application Nasal BID  . nicotine  21 mg Transdermal Daily  . senna-docusate  2 tablet Oral QHS  . sodium chloride  3 mL Intravenous Q12H  . sodium chloride  3 mL Intravenous Q12H  . thiamine  100 mg Oral  Daily  . vancomycin  500 mg Intravenous Q12H   Continuous Infusions: . sodium chloride Stopped (02/23/15 1200)  . sodium chloride 75 mL/hr at 02/23/15 1233   PRN Meds:.sodium chloride, acetaminophen, alum & mag hydroxide-simeth, HYDROmorphone (DILAUDID) injection, LORazepam, ondansetron (ZOFRAN) IV, oxyCODONE, sodium chloride, traZODone    PHYSICAL EXAM: Vital signs in last 24 hours: Filed Vitals:   02/23/15 2247 02/24/15 0000 02/24/15 0500 02/24/15 0730  BP:  125/78 124/72 121/67  Pulse:  61 60 56  Temp:  98.6 F (37 C) 100 F (37.8 C) 98.1 F (36.7 C)  TempSrc:  Oral Oral Oral  Resp:  16 14 15   Height:      Weight:      SpO2: 99% 97% 97% 96%    Weight change:  Filed Weights   02/22/15 2330  Weight: 63.1 kg (139 lb 1.8 oz)   Body mass index is 22.46 kg/(m^2).   Gen Exam: Awake and alert with clear speech.   Neck: Supple, No JVD.   Chest: B/L Clear.   CVS: S1 S2 Regular, no murmurs.  Abdomen: soft, BS +, non tender, non distended.  Extremities: no edema, lower extremities warm to touch.Mild tender to palpation in plantar surface of both his feet-erythematous area in mid plantar surface of the right foot-still tender. Neurologic: LLE 5/5-RLE 4/5-limited due to pain Skin: No Rash.   Wounds: N/A.   Intake/Output from previous day:  Intake/Output Summary (Last 24 hours) at 02/24/15 0755 Last data filed at 02/24/15 0600  Gross per 24 hour  Intake 3228.75 ml  Output   3775 ml  Net -546.25 ml     LAB RESULTS: CBC  Recent Labs Lab 02/22/15 0509 02/23/15 0406 02/24/15 0451  WBC 20.9* 10.5 7.8  HGB 16.1 13.3 13.9  HCT 47.8 39.2 41.4  PLT 240 186 204  MCV 89.7 88.5 89.2  MCH 30.2 30.0 30.0  MCHC 33.7 33.9 33.6  RDW 12.4 12.5 12.2  LYMPHSABS 1.8  --   --   MONOABS 0.9  --   --   EOSABS 0.0  --   --   BASOSABS 0.0  --   --     Chemistries   Recent Labs Lab 02/22/15 0509 02/22/15 1202 02/23/15 0406 02/24/15 0451  NA 136 135 136 140  K 6.1* 4.7  4.5 4.1  CL 101 105 110 107  CO2 18* 20* 21* 26  GLUCOSE 142* 77 117* 113*  BUN 59* 55* 36* 13  CREATININE 2.70* 2.45* 2.04* 1.40*  CALCIUM 7.9* 8.1* 8.0* 8.8*    CBG: No results for input(s): GLUCAP in the last 168 hours.  GFR Estimated Creatinine Clearance: 74.5 mL/min (by C-G formula based on Cr of 1.4).  Coagulation profile No results for input(s): INR, PROTIME in the last 168 hours.  Cardiac Enzymes  Recent Labs Lab 02/22/15 1207 02/22/15 1925 02/22/15 2145  TROPONINI >65.00* >65.00* 61.88*    Invalid input(s): POCBNP No results for input(s): DDIMER in the last 72 hours. No results for input(s): HGBA1C in the last 72 hours.  Recent Labs  02/23/15 0406  CHOL 138  HDL 32*  LDLCALC 71  TRIG 173*  CHOLHDL 4.3   No results for input(s): TSH, T4TOTAL, T3FREE, THYROIDAB in the last 72 hours.  Invalid input(s): FREET3 No results for input(s): VITAMINB12, FOLATE, FERRITIN, TIBC, IRON, RETICCTPCT in the last 72 hours. No results for input(s): LIPASE, AMYLASE in the last 72 hours.  Urine Studies No results for input(s): UHGB, CRYS in the last 72 hours.  Invalid input(s): UACOL, UAPR, USPG, UPH, UTP, UGL, UKET, UBIL, UNIT, UROB, ULEU, UEPI, UWBC, URBC, UBAC, CAST, UCOM, BILUA  MICROBIOLOGY: Recent Results (from the past 240 hour(s))  Blood culture (routine x 2)     Status: None (Preliminary result)   Collection Time: 02/22/15  5:10 AM  Result Value Ref Range Status   Specimen Description BLOOD RIGHT FOREARM  Final   Special Requests BOTTLES DRAWN AEROBIC AND ANAEROBIC 5ML  Final   Culture   Final    NO GROWTH 1 DAY Performed at [facility]    Report Status PENDING  Incomplete  Blood culture (routine x 2)     Status: None (Preliminary result)   Collection Time: 02/22/15  5:20 AM  Result Value Ref Range Status   Specimen Description BLOOD LEFT HAND  Final   Special Requests IN PEDIATRIC BOTTLE 2ML  Final   Culture   Final    NO GROWTH 1  DAY Performed at [facility]    Report Status PENDING  Incomplete  Culture, blood (single)     Status: None (Preliminary result)   Collection Time: 02/22/15  7:46 AM  Result Value Ref Range Status   Specimen Description BLOOD RIGHT HAND  Final   Special Requests BOTTLES DRAWN AEROBIC AND ANAEROBIC 5CC  Final   Culture NO GROWTH 1 DAY  Final   Report Status PENDING  Incomplete  MRSA PCR Screening     Status: Abnormal   Collection Time: 02/22/15  5:05 PM  Result Value Ref Range Status   MRSA by PCR POSITIVE (A) NEGATIVE Final    Comment:        The GeneXpert MRSA Assay (FDA approved for NASAL specimens only), is one component of a comprehensive MRSA colonization surveillance program. It is not intended to diagnose MRSA infection nor to guide or monitor treatment for MRSA infections. RESULT CALLED TO, READ BACK BY AND VERIFIED WITH: J.PERKINS,RN 02/22/15 @2010  BY V.WILKINS     RADIOLOGY STUDIES/RESULTS: Dg Chest 2 View  02/22/2015  CLINICAL DATA:  Shortness of Breath EXAM: CHEST - 2 VIEW COMPARISON:  None. FINDINGS: The heart size and mediastinal contours are within normal limits. Both lungs are clear. The visualized skeletal structures are unremarkable. IMPRESSION: No active disease. Electronically Signed   By: Inez Catalina M.D.   On: 02/22/2015 08:54   Mr Thoracic Spine W Wo Contrast  02/22/2015  CLINICAL DATA:  19 year old with recent hospitalization for narcotic induced respiratory failure. Patient awoke this morning with severe foot pain, bilateral leg weakness and inability to urinate. History of hepatitis B. EXAM: MRI THORACIC AND LUMBAR SPINE WITHOUT AND WITH CONTRAST TECHNIQUE: Multiplanar and multiecho pulse sequences of the thoracic and lumbar spine were obtained without and with intravenous contrast. CONTRAST:  63mL MULTIHANCE GADOBENATE DIMEGLUMINE 529 MG/ML IV SOLN COMPARISON:  CT of the chest, abdomen and pelvis 02/27/2014. Chest radiographs today. FINDINGS:  MR THORACIC SPINE FINDINGS Alignment:  Normal. Bones: No acute or suspicious osseous findings. There is no abnormal osseous or endplate enhancement. Cord: The thoracic cord appears normal in signal and caliber. No abnormal intradural enhancement. Paraspinal and other soft  tissues: No significant paraspinal abnormalities. There is concern of patchy airspace disease at both lung bases, worse on the left. No significant pleural effusion. Disc levels: All of the thoracic discs appear normal. There is no evidence of disc herniation, spinal stenosis or nerve root encroachment. There is no evidence of discitis. MR LUMBAR SPINE FINDINGS Segmentation: Conventional anatomy assumed, with the last open disc space designated L5-S1. Alignment:  Normal. Bones: No worrisome osseous lesion, acute fracture or pars defect. No evidence of discitis or osteomyelitis. The lumbar pedicles are somewhat short on a congenital basis. The sacroiliac joints appear normal. Conus medullaris: Extends to the L1 level and appears normal. There is no abnormal intradural enhancement. Paraspinal and other soft tissues: No significant paraspinal findings. On the lower axial images, there is asymmetric T2 hyperintensity throughout the right gluteus musculature. There is also asymmetric enhancement in this area on the post-contrast images. No focal fluid collection identified. Disc levels: All of the lumbar discs are well hydrated. There is no disc herniation, spinal stenosis or nerve root encroachment. No evidence of discitis or osteomyelitis. IMPRESSION: 1. Negative MRI of the thoracolumbar spine. No evidence of disc herniation, spinal stenosis, discitis or osteomyelitis. The thoracic cord and conus medullaris appear normal. 2. Concern of patchy airspace opacities at both lung bases, worrisome for early aspiration in this clinical context. Chest radiographic follow up recommended. 3. T2 hyperintensity and enhancement in the right gluteus musculature,  incompletely visualized. In this clinical context, this could indicate rhabdomyolysis. Correlate clinically. Follow up CK levels recommended. Electronically Signed   By: Richardean Sale M.D.   On: 02/22/2015 11:01   Mr Lumbar Spine W Wo Contrast  02/22/2015  CLINICAL DATA:  19 year old with recent hospitalization for narcotic induced respiratory failure. Patient awoke this morning with severe foot pain, bilateral leg weakness and inability to urinate. History of hepatitis B. EXAM: MRI THORACIC AND LUMBAR SPINE WITHOUT AND WITH CONTRAST TECHNIQUE: Multiplanar and multiecho pulse sequences of the thoracic and lumbar spine were obtained without and with intravenous contrast. CONTRAST:  86mL MULTIHANCE GADOBENATE DIMEGLUMINE 529 MG/ML IV SOLN COMPARISON:  CT of the chest, abdomen and pelvis 02/27/2014. Chest radiographs today. FINDINGS: MR THORACIC SPINE FINDINGS Alignment:  Normal. Bones: No acute or suspicious osseous findings. There is no abnormal osseous or endplate enhancement. Cord: The thoracic cord appears normal in signal and caliber. No abnormal intradural enhancement. Paraspinal and other soft tissues: No significant paraspinal abnormalities. There is concern of patchy airspace disease at both lung bases, worse on the left. No significant pleural effusion. Disc levels: All of the thoracic discs appear normal. There is no evidence of disc herniation, spinal stenosis or nerve root encroachment. There is no evidence of discitis. MR LUMBAR SPINE FINDINGS Segmentation: Conventional anatomy assumed, with the last open disc space designated L5-S1. Alignment:  Normal. Bones: No worrisome osseous lesion, acute fracture or pars defect. No evidence of discitis or osteomyelitis. The lumbar pedicles are somewhat short on a congenital basis. The sacroiliac joints appear normal. Conus medullaris: Extends to the L1 level and appears normal. There is no abnormal intradural enhancement. Paraspinal and other soft tissues:  No significant paraspinal findings. On the lower axial images, there is asymmetric T2 hyperintensity throughout the right gluteus musculature. There is also asymmetric enhancement in this area on the post-contrast images. No focal fluid collection identified. Disc levels: All of the lumbar discs are well hydrated. There is no disc herniation, spinal stenosis or nerve root encroachment. No evidence of discitis or osteomyelitis. IMPRESSION:  1. Negative MRI of the thoracolumbar spine. No evidence of disc herniation, spinal stenosis, discitis or osteomyelitis. The thoracic cord and conus medullaris appear normal. 2. Concern of patchy airspace opacities at both lung bases, worrisome for early aspiration in this clinical context. Chest radiographic follow up recommended. 3. T2 hyperintensity and enhancement in the right gluteus musculature, incompletely visualized. In this clinical context, this could indicate rhabdomyolysis. Correlate clinically. Follow up CK levels recommended. Electronically Signed   By: Richardean Sale M.D.   On: 02/22/2015 11:01   Dg Foot Complete Left  02/22/2015  CLINICAL DATA:  Bilateral foot pain EXAM: LEFT FOOT - COMPLETE 3+ VIEW COMPARISON:  None. FINDINGS: There is no evidence of fracture or dislocation. There is no evidence of arthropathy or other focal bone abnormality. Soft tissues are unremarkable. IMPRESSION: No acute abnormality noted. Electronically Signed   By: Inez Catalina M.D.   On: 02/22/2015 08:55   Dg Foot Complete Right  02/22/2015  CLINICAL DATA:  Bilateral foot pain EXAM: RIGHT FOOT COMPLETE - 3+ VIEW COMPARISON:  None. FINDINGS: There is no evidence of fracture or dislocation. There is no evidence of arthropathy or other focal bone abnormality. Soft tissues are unremarkable. IMPRESSION: No acute abnormality noted. Electronically Signed   By: Inez Catalina M.D.   On: 02/22/2015 08:55    Oren Binet, MD  [facility] Pager:[phone]  If 7PM-7AM, please  contact night-coverage [email or web address] Password [email or web address] 02/24/2015, 7:55 AM   LOS: 2 days

## 2015-02-24 NOTE — Progress Notes (Signed)
Patient Name: Erik [name] Date of Encounter: 02/24/2015     Principal Problem:   Lower extremity pain, bilateral Active Problems:   IVDU (intravenous drug user)   Hyperkalemia   Elevated troponin   Leukocytosis   Acute renal failure due to rhabdomyolysis [facility])   Rhabdomyolysis   NSTEMI (non-ST elevated myocardial infarction) ([place])   IV drug abuse   Cocaine abuse    SUBJECTIVE    CURRENT MEDS . aspirin EC  81 mg Oral Daily  . aztreonam  1 g Intravenous Q8H  . carvedilol  3.125 mg Oral BID WC  . Chlorhexidine Gluconate Cloth  6 each Topical Q0600  . folic acid  1 mg Oral Daily  . heparin subcutaneous  5,000 Units Subcutaneous 3 times per day  . multivitamin with minerals  1 tablet Oral Daily  . mupirocin ointment  1 application Nasal BID  . nicotine  21 mg Transdermal Daily  . senna-docusate  2 tablet Oral QHS  . sodium chloride  3 mL Intravenous Q12H  . sodium chloride  3 mL Intravenous Q12H  . thiamine  100 mg Oral Daily  . vancomycin  500 mg Intravenous Q12H    OBJECTIVE  Filed Vitals:   02/23/15 2247 02/24/15 0000 02/24/15 0500 02/24/15 0730  BP:  125/78 124/72 121/67  Pulse:  61 60 56  Temp:  98.6 F (37 C) 100 F (37.8 C) 98.1 F (36.7 C)  TempSrc:  Oral Oral Oral  Resp:  16 14 15   Height:      Weight:      SpO2: 99% 97% 97% 96%    Intake/Output Summary (Last 24 hours) at 02/24/15 1037 Last data filed at 02/24/15 0800  Gross per 24 hour  Intake 2608.75 ml  Output   3775 ml  Net -1166.25 ml   Filed Weights   02/22/15 2330  Weight: 139 lb 1.8 oz (63.1 kg)    PHYSICAL EXAM  General: Pleasant, NAD. Neuro: Alert and oriented X 3. Moves all extremities spontaneously. Psych: Normal affect. HEENT:  Normal  Neck: Supple without bruits or JVD. Lungs:  Resp regular and unlabored, CTA. Heart: RRR no s3, s4, or murmurs. Abdomen: Soft, non-tender, non-distended, BS + x 4.  Extremities: No clubbing, cyanosis or edema. DP/PT/Radials 2+ and  equal bilaterally.  Accessory Clinical Findings  CBC  Recent Labs  02/22/15 0509 02/23/15 0406 02/24/15 0451  WBC 20.9* 10.5 7.8  NEUTROABS 18.1*  --   --   HGB 16.1 13.3 13.9  HCT 47.8 39.2 41.4  MCV 89.7 88.5 89.2  PLT 240 186 0000000   Basic Metabolic Panel  Recent Labs  02/23/15 0406 02/24/15 0451  NA 136 140  K 4.5 4.1  CL 110 107  CO2 21* 26  GLUCOSE 117* 113*  BUN 36* 13  CREATININE 2.04* 1.40*  CALCIUM 8.0* 8.8*   Liver Function Tests  Recent Labs  02/24/15 0451  AST 415*  ALT 246*  ALKPHOS 56  BILITOT 0.8  PROT 5.7*  ALBUMIN 2.8*   No results for input(s): LIPASE, AMYLASE in the last 72 hours. Cardiac Enzymes  Recent Labs  02/22/15 1207 02/22/15 1925 02/22/15 2145 02/23/15 0406 02/24/15 0451  CKTOTAL 16109*  --   --  [id number]* 9490*  TROPONINI >65.00* >65.00* 61.88*  --   --    BNP Invalid input(s): POCBNP D-Dimer No results for input(s): DDIMER in the last 72 hours. Hemoglobin A1C No results for input(s): HGBA1C in the last 72 hours.  Fasting Lipid Panel  Recent Labs  02/23/15 0406  CHOL 138  HDL 32*  LDLCALC 71  TRIG 173*  CHOLHDL 4.3   Thyroid Function Tests No results for input(s): TSH, T4TOTAL, T3FREE, THYROIDAB in the last 72 hours.  Invalid input(s): FREET3  TELE    ECG    Radiology/Studies  Dg Chest 2 View  02/22/2015  CLINICAL DATA:  Shortness of Breath EXAM: CHEST - 2 VIEW COMPARISON:  None. FINDINGS: The heart size and mediastinal contours are within normal limits. Both lungs are clear. The visualized skeletal structures are unremarkable. IMPRESSION: No active disease. Electronically Signed   By: Inez Catalina M.D.   On: 02/22/2015 08:54   Mr Thoracic Spine W Wo Contrast  02/22/2015  CLINICAL DATA:  19 year old with recent hospitalization for narcotic induced respiratory failure. Patient awoke this morning with severe foot pain, bilateral leg weakness and inability to urinate. History of hepatitis B. EXAM: MRI  THORACIC AND LUMBAR SPINE WITHOUT AND WITH CONTRAST TECHNIQUE: Multiplanar and multiecho pulse sequences of the thoracic and lumbar spine were obtained without and with intravenous contrast. CONTRAST:  95mL MULTIHANCE GADOBENATE DIMEGLUMINE 529 MG/ML IV SOLN COMPARISON:  CT of the chest, abdomen and pelvis 02/27/2014. Chest radiographs today. FINDINGS: MR THORACIC SPINE FINDINGS Alignment:  Normal. Bones: No acute or suspicious osseous findings. There is no abnormal osseous or endplate enhancement. Cord: The thoracic cord appears normal in signal and caliber. No abnormal intradural enhancement. Paraspinal and other soft tissues: No significant paraspinal abnormalities. There is concern of patchy airspace disease at both lung bases, worse on the left. No significant pleural effusion. Disc levels: All of the thoracic discs appear normal. There is no evidence of disc herniation, spinal stenosis or nerve root encroachment. There is no evidence of discitis. MR LUMBAR SPINE FINDINGS Segmentation: Conventional anatomy assumed, with the last open disc space designated L5-S1. Alignment:  Normal. Bones: No worrisome osseous lesion, acute fracture or pars defect. No evidence of discitis or osteomyelitis. The lumbar pedicles are somewhat short on a congenital basis. The sacroiliac joints appear normal. Conus medullaris: Extends to the L1 level and appears normal. There is no abnormal intradural enhancement. Paraspinal and other soft tissues: No significant paraspinal findings. On the lower axial images, there is asymmetric T2 hyperintensity throughout the right gluteus musculature. There is also asymmetric enhancement in this area on the post-contrast images. No focal fluid collection identified. Disc levels: All of the lumbar discs are well hydrated. There is no disc herniation, spinal stenosis or nerve root encroachment. No evidence of discitis or osteomyelitis. IMPRESSION: 1. Negative MRI of the thoracolumbar spine. No  evidence of disc herniation, spinal stenosis, discitis or osteomyelitis. The thoracic cord and conus medullaris appear normal. 2. Concern of patchy airspace opacities at both lung bases, worrisome for early aspiration in this clinical context. Chest radiographic follow up recommended. 3. T2 hyperintensity and enhancement in the right gluteus musculature, incompletely visualized. In this clinical context, this could indicate rhabdomyolysis. Correlate clinically. Follow up CK levels recommended. Electronically Signed   By: Richardean Sale M.D.   On: 02/22/2015 11:01   Mr Lumbar Spine W Wo Contrast  02/22/2015  CLINICAL DATA:  19 year old with recent hospitalization for narcotic induced respiratory failure. Patient awoke this morning with severe foot pain, bilateral leg weakness and inability to urinate. History of hepatitis B. EXAM: MRI THORACIC AND LUMBAR SPINE WITHOUT AND WITH CONTRAST TECHNIQUE: Multiplanar and multiecho pulse sequences of the thoracic and lumbar spine were obtained without and with intravenous  contrast. CONTRAST:  69mL MULTIHANCE GADOBENATE DIMEGLUMINE 529 MG/ML IV SOLN COMPARISON:  CT of the chest, abdomen and pelvis 02/27/2014. Chest radiographs today. FINDINGS: MR THORACIC SPINE FINDINGS Alignment:  Normal. Bones: No acute or suspicious osseous findings. There is no abnormal osseous or endplate enhancement. Cord: The thoracic cord appears normal in signal and caliber. No abnormal intradural enhancement. Paraspinal and other soft tissues: No significant paraspinal abnormalities. There is concern of patchy airspace disease at both lung bases, worse on the left. No significant pleural effusion. Disc levels: All of the thoracic discs appear normal. There is no evidence of disc herniation, spinal stenosis or nerve root encroachment. There is no evidence of discitis. MR LUMBAR SPINE FINDINGS Segmentation: Conventional anatomy assumed, with the last open disc space designated L5-S1. Alignment:   Normal. Bones: No worrisome osseous lesion, acute fracture or pars defect. No evidence of discitis or osteomyelitis. The lumbar pedicles are somewhat short on a congenital basis. The sacroiliac joints appear normal. Conus medullaris: Extends to the L1 level and appears normal. There is no abnormal intradural enhancement. Paraspinal and other soft tissues: No significant paraspinal findings. On the lower axial images, there is asymmetric T2 hyperintensity throughout the right gluteus musculature. There is also asymmetric enhancement in this area on the post-contrast images. No focal fluid collection identified. Disc levels: All of the lumbar discs are well hydrated. There is no disc herniation, spinal stenosis or nerve root encroachment. No evidence of discitis or osteomyelitis. IMPRESSION: 1. Negative MRI of the thoracolumbar spine. No evidence of disc herniation, spinal stenosis, discitis or osteomyelitis. The thoracic cord and conus medullaris appear normal. 2. Concern of patchy airspace opacities at both lung bases, worrisome for early aspiration in this clinical context. Chest radiographic follow up recommended. 3. T2 hyperintensity and enhancement in the right gluteus musculature, incompletely visualized. In this clinical context, this could indicate rhabdomyolysis. Correlate clinically. Follow up CK levels recommended. Electronically Signed   By: Richardean Sale M.D.   On: 02/22/2015 11:01   Dg Foot Complete Left  02/22/2015  CLINICAL DATA:  Bilateral foot pain EXAM: LEFT FOOT - COMPLETE 3+ VIEW COMPARISON:  None. FINDINGS: There is no evidence of fracture or dislocation. There is no evidence of arthropathy or other focal bone abnormality. Soft tissues are unremarkable. IMPRESSION: No acute abnormality noted. Electronically Signed   By: Inez Catalina M.D.   On: 02/22/2015 08:55   Dg Foot Complete Right  02/22/2015  CLINICAL DATA:  Bilateral foot pain EXAM: RIGHT FOOT COMPLETE - 3+ VIEW COMPARISON:   None. FINDINGS: There is no evidence of fracture or dislocation. There is no evidence of arthropathy or other focal bone abnormality. Soft tissues are unremarkable. IMPRESSION: No acute abnormality noted. Electronically Signed   By: Inez Catalina M.D.   On: 02/22/2015 08:55    ASSESSMENT AND PLAN    Signed, Warren Danes MD

## 2015-02-24 NOTE — Progress Notes (Signed)
Patient arrived in the room at 18:40 pm via wheelchair accompanied by NT and family members.

## 2015-02-24 NOTE — Progress Notes (Signed)
Echocardiogram Echocardiogram Transesophageal has been performed.  Tresa Res 02/24/2015, 4:01 PM

## 2015-02-24 NOTE — Evaluation (Signed)
Physical Therapy Evaluation Patient Details Name: Erik [name] MRN: [id number] DOB: 05/10/1995 Today's Date: 02/24/2015   History of Present Illness  MERCY [name] is a 19 y.o. male with history of IV drug use who presented with severe foot pain, bilateral hip pain, lower extremity weakness initially to [facility] ED, transfer to [facility] for MRI to rule out cord compression. Patient admitted to IV heroin use 2 days ago, NED, troponin 46, ST depressions in the lateral leads.  Clinical Impression  Patient presents with decreased mobility due to deficits listed in PT problem list.  Mainly weak and limited tolerance due to foot pain.  Will benefit from skilled PT in the acute setting to allow return home with family assist and potentially follow up cardiac rehab, if pt qualifies, vs. Outpatient PT .     Follow Up Recommendations Other (comment);Outpatient PT (question if qualifies for cardiac rehab)    Equipment Recommendations  None recommended by PT    Recommendations for Other Services       Precautions / Restrictions Precautions Precautions: Fall      Mobility  Bed Mobility Overal bed mobility: Needs Assistance Bed Mobility: Supine to Sit     Supine to sit: Min assist;HOB elevated     General bed mobility comments: to lift trunk and cues for technique using rail  Transfers Overall transfer level: Needs assistance Equipment used: Rolling walker (2 wheeled) Transfers: Sit to/from [facility] Sit to Stand: Mod assist Stand pivot transfers: Min assist       General transfer comment: assist to stabilize walker due to pt pulling up despite cues for pushing up from bed, assist for lifting hips from surface of bed; assist with walker to stand and take pivotal steps to chair  Ambulation/Gait             General Gait Details: deferred due to pain, swelling R foot  Stairs            Wheelchair Mobility    Modified Rankin (Stroke Patients  Only)       Balance Overall balance assessment: Needs assistance         Standing balance support: Bilateral upper extremity supported Standing balance-Leahy Scale: Poor Standing balance comment: assist with walker for balance due to foot pain                             Pertinent Vitals/Pain Pain Assessment: Faces Faces Pain Scale: Hurts even more Pain Location: L foot with weight bearing Pain Descriptors / Indicators: Aching Pain Intervention(s): Limited activity within patient's tolerance;Monitored during session;Repositioned    Home Living Family/patient expects to be discharged to:: Private residence Living Arrangements: Non-relatives/Friends (lives wtih roomate, but likely to go home with parents initially) Available Help at Discharge: Family;Available PRN/intermittently   Home Access: Stairs to enter   Entrance Stairs-Number of Steps: 3 Home Layout: Two level;Able to live on main level with bedroom/bathroom Home Equipment: Gilford Rile - 2 wheels Additional Comments: father in the room and reports patient likely to come stay with them a little while prior to returning to his one level home with 2 steps at side and 10 at front entrance    Prior Function Level of Independence: Independent               Hand Dominance        Extremity/Trunk Assessment   Upper Extremity Assessment: Generalized weakness  Lower Extremity Assessment: Generalized weakness;LLE deficits/detail;RLE deficits/detail RLE Deficits / Details: swelling in right foot over dorsum and errythema on plantar surface, limited ankle DF, other AROM WFL, strength grossly 4-/5 LLE Deficits / Details: AROM ankle DF less than neutral with c/o pain in calf in standing, strength at least 4-/5 throughout     Communication   Communication: No difficulties  Cognition Arousal/Alertness: Awake/alert Behavior During Therapy: WFL for tasks assessed/performed Overall Cognitive Status:  Within Functional Limits for tasks assessed                      General Comments      Exercises General Exercises - Upper Extremity Shoulder Flexion: AROM;Both;5 reps;Seated General Exercises - Lower Extremity Ankle Circles/Pumps: AROM;Both;5 reps;Supine;Seated Straight Leg Raises: AROM;Both;5 reps;Seated Other Exercises Other Exercises: educated on towel stretch to R ankle into DF hold x 20 sec      Assessment/Plan    PT Assessment Patient needs continued PT services  PT Diagnosis Acute pain;Difficulty walking;Generalized weakness   PT Problem List Decreased strength;Decreased range of motion;Pain;Impaired sensation;Decreased activity tolerance;Decreased knowledge of use of DME;Decreased balance;Decreased mobility  PT Treatment Interventions DME instruction;Balance training;Stair training;Gait training;Functional mobility training;Patient/family education;Therapeutic activities;Therapeutic exercise   PT Goals (Current goals can be found in the Care Plan section) Acute Rehab PT Goals Patient Stated Goal: To return to independent PT Goal Formulation: With patient/family Time For Goal Achievement: 03/03/15 Potential to Achieve Goals: Good    Frequency Min 3X/week   Barriers to discharge        Co-evaluation               End of Session Equipment Utilized During Treatment: Gait belt Activity Tolerance: Patient limited by pain Patient left: in chair;with call bell/phone within reach;with family/visitor present Nurse Communication: Mobility status         Time: 0945-1010 PT Time Calculation (min) (ACUTE ONLY): 25 min   Charges:   PT Evaluation $Initial PT Evaluation Tier I: 1 Procedure PT Treatments $Therapeutic Activity: 8-22 mins   PT G CodesReginia Naas 2015-03-02, 10:22 AM  Magda Kiel, [place] 03/02/2015

## 2015-02-24 NOTE — H&P (View-Only) (Signed)
Patient Name: Erik [name] Date of Encounter: 02/24/2015     Principal Problem:   Lower extremity pain, bilateral Active Problems:   IVDU (intravenous drug user)   Hyperkalemia   Elevated troponin   Leukocytosis   Acute renal failure due to rhabdomyolysis [facility])   Rhabdomyolysis   NSTEMI (non-ST elevated myocardial infarction) ([employer])   IV drug abuse   Cocaine abuse    SUBJECTIVE  The patient is feeling better. No chest pain. Temp 100 earlier today. NPO for TEE today. Reason for TEE discussed with patient and his father in room today.  CURRENT MEDS . aspirin EC  81 mg Oral Daily  . aztreonam  1 g Intravenous Q8H  . carvedilol  3.125 mg Oral BID WC  . Chlorhexidine Gluconate Cloth  6 each Topical Q0600  . folic acid  1 mg Oral Daily  . heparin subcutaneous  5,000 Units Subcutaneous 3 times per day  . multivitamin with minerals  1 tablet Oral Daily  . mupirocin ointment  1 application Nasal BID  . nicotine  21 mg Transdermal Daily  . senna-docusate  2 tablet Oral QHS  . sodium chloride  3 mL Intravenous Q12H  . sodium chloride  3 mL Intravenous Q12H  . thiamine  100 mg Oral Daily  . vancomycin  500 mg Intravenous Q12H    OBJECTIVE  Filed Vitals:   02/23/15 2247 02/24/15 0000 02/24/15 0500 02/24/15 0730  BP:  125/78 124/72 121/67  Pulse:  61 60 56  Temp:  98.6 F (37 C) 100 F (37.8 C) 98.1 F (36.7 C)  TempSrc:  Oral Oral Oral  Resp:  16 14 15   Height:      Weight:      SpO2: 99% 97% 97% 96%    Intake/Output Summary (Last 24 hours) at 02/24/15 1050 Last data filed at 02/24/15 0800  Gross per 24 hour  Intake 2608.75 ml  Output   3775 ml  Net -1166.25 ml   Filed Weights   02/22/15 2330  Weight: 139 lb 1.8 oz (63.1 kg)    PHYSICAL EXAM  General: Pleasant, NAD. Neuro: Alert and oriented X 3. Moves all extremities spontaneously. Psych: Normal affect. HEENT:  Normal  Neck: Supple without bruits or JVD. Lungs:  Resp regular and unlabored,  CTA. Heart: RRR no s3, s4, or murmurs. Abdomen: Soft, non-tender, non-distended, BS + x 4.    Accessory Clinical Findings  CBC  Recent Labs  02/22/15 0509 02/23/15 0406 02/24/15 0451  WBC 20.9* 10.5 7.8  NEUTROABS 18.1*  --   --   HGB 16.1 13.3 13.9  HCT 47.8 39.2 41.4  MCV 89.7 88.5 89.2  PLT 240 186 0000000   Basic Metabolic Panel  Recent Labs  02/23/15 0406 02/24/15 0451  NA 136 140  K 4.5 4.1  CL 110 107  CO2 21* 26  GLUCOSE 117* 113*  BUN 36* 13  CREATININE 2.04* 1.40*  CALCIUM 8.0* 8.8*   Liver Function Tests  Recent Labs  02/24/15 0451  AST 415*  ALT 246*  ALKPHOS 56  BILITOT 0.8  PROT 5.7*  ALBUMIN 2.8*   No results for input(s): LIPASE, AMYLASE in the last 72 hours. Cardiac Enzymes  Recent Labs  02/22/15 1207 02/22/15 1925 02/22/15 2145 02/23/15 0406 02/24/15 0451  CKTOTAL 29562*  --   --  [id number]* 9490*  TROPONINI >65.00* >65.00* 61.88*  --   --    BNP Invalid input(s): POCBNP D-Dimer No results for input(s): DDIMER  in the last 72 hours. Hemoglobin A1C No results for input(s): HGBA1C in the last 72 hours. Fasting Lipid Panel  Recent Labs  02/23/15 0406  CHOL 138  HDL 32*  LDLCALC 71  TRIG 173*  CHOLHDL 4.3   Thyroid Function Tests No results for input(s): TSH, T4TOTAL, T3FREE, THYROIDAB in the last 72 hours.  Invalid input(s): FREET3  TELE  NSR  ECG    Radiology/Studies  Dg Chest 2 View  02/22/2015  CLINICAL DATA:  Shortness of Breath EXAM: CHEST - 2 VIEW COMPARISON:  None. FINDINGS: The heart size and mediastinal contours are within normal limits. Both lungs are clear. The visualized skeletal structures are unremarkable. IMPRESSION: No active disease. Electronically Signed   By: Inez Catalina M.D.   On: 02/22/2015 08:54   Mr Thoracic Spine W Wo Contrast  02/22/2015  CLINICAL DATA:  19 year old with recent hospitalization for narcotic induced respiratory failure. Patient awoke this morning with severe foot pain,  bilateral leg weakness and inability to urinate. History of hepatitis B. EXAM: MRI THORACIC AND LUMBAR SPINE WITHOUT AND WITH CONTRAST TECHNIQUE: Multiplanar and multiecho pulse sequences of the thoracic and lumbar spine were obtained without and with intravenous contrast. CONTRAST:  65mL MULTIHANCE GADOBENATE DIMEGLUMINE 529 MG/ML IV SOLN COMPARISON:  CT of the chest, abdomen and pelvis 02/27/2014. Chest radiographs today. FINDINGS: MR THORACIC SPINE FINDINGS Alignment:  Normal. Bones: No acute or suspicious osseous findings. There is no abnormal osseous or endplate enhancement. Cord: The thoracic cord appears normal in signal and caliber. No abnormal intradural enhancement. Paraspinal and other soft tissues: No significant paraspinal abnormalities. There is concern of patchy airspace disease at both lung bases, worse on the left. No significant pleural effusion. Disc levels: All of the thoracic discs appear normal. There is no evidence of disc herniation, spinal stenosis or nerve root encroachment. There is no evidence of discitis. MR LUMBAR SPINE FINDINGS Segmentation: Conventional anatomy assumed, with the last open disc space designated L5-S1. Alignment:  Normal. Bones: No worrisome osseous lesion, acute fracture or pars defect. No evidence of discitis or osteomyelitis. The lumbar pedicles are somewhat short on a congenital basis. The sacroiliac joints appear normal. Conus medullaris: Extends to the L1 level and appears normal. There is no abnormal intradural enhancement. Paraspinal and other soft tissues: No significant paraspinal findings. On the lower axial images, there is asymmetric T2 hyperintensity throughout the right gluteus musculature. There is also asymmetric enhancement in this area on the post-contrast images. No focal fluid collection identified. Disc levels: All of the lumbar discs are well hydrated. There is no disc herniation, spinal stenosis or nerve root encroachment. No evidence of discitis  or osteomyelitis. IMPRESSION: 1. Negative MRI of the thoracolumbar spine. No evidence of disc herniation, spinal stenosis, discitis or osteomyelitis. The thoracic cord and conus medullaris appear normal. 2. Concern of patchy airspace opacities at both lung bases, worrisome for early aspiration in this clinical context. Chest radiographic follow up recommended. 3. T2 hyperintensity and enhancement in the right gluteus musculature, incompletely visualized. In this clinical context, this could indicate rhabdomyolysis. Correlate clinically. Follow up CK levels recommended. Electronically Signed   By: Richardean Sale M.D.   On: 02/22/2015 11:01   Mr Lumbar Spine W Wo Contrast  02/22/2015  CLINICAL DATA:  19 year old with recent hospitalization for narcotic induced respiratory failure. Patient awoke this morning with severe foot pain, bilateral leg weakness and inability to urinate. History of hepatitis B. EXAM: MRI THORACIC AND LUMBAR SPINE WITHOUT AND WITH CONTRAST TECHNIQUE:  Multiplanar and multiecho pulse sequences of the thoracic and lumbar spine were obtained without and with intravenous contrast. CONTRAST:  64mL MULTIHANCE GADOBENATE DIMEGLUMINE 529 MG/ML IV SOLN COMPARISON:  CT of the chest, abdomen and pelvis 02/27/2014. Chest radiographs today. FINDINGS: MR THORACIC SPINE FINDINGS Alignment:  Normal. Bones: No acute or suspicious osseous findings. There is no abnormal osseous or endplate enhancement. Cord: The thoracic cord appears normal in signal and caliber. No abnormal intradural enhancement. Paraspinal and other soft tissues: No significant paraspinal abnormalities. There is concern of patchy airspace disease at both lung bases, worse on the left. No significant pleural effusion. Disc levels: All of the thoracic discs appear normal. There is no evidence of disc herniation, spinal stenosis or nerve root encroachment. There is no evidence of discitis. MR LUMBAR SPINE FINDINGS Segmentation: Conventional  anatomy assumed, with the last open disc space designated L5-S1. Alignment:  Normal. Bones: No worrisome osseous lesion, acute fracture or pars defect. No evidence of discitis or osteomyelitis. The lumbar pedicles are somewhat short on a congenital basis. The sacroiliac joints appear normal. Conus medullaris: Extends to the L1 level and appears normal. There is no abnormal intradural enhancement. Paraspinal and other soft tissues: No significant paraspinal findings. On the lower axial images, there is asymmetric T2 hyperintensity throughout the right gluteus musculature. There is also asymmetric enhancement in this area on the post-contrast images. No focal fluid collection identified. Disc levels: All of the lumbar discs are well hydrated. There is no disc herniation, spinal stenosis or nerve root encroachment. No evidence of discitis or osteomyelitis. IMPRESSION: 1. Negative MRI of the thoracolumbar spine. No evidence of disc herniation, spinal stenosis, discitis or osteomyelitis. The thoracic cord and conus medullaris appear normal. 2. Concern of patchy airspace opacities at both lung bases, worrisome for early aspiration in this clinical context. Chest radiographic follow up recommended. 3. T2 hyperintensity and enhancement in the right gluteus musculature, incompletely visualized. In this clinical context, this could indicate rhabdomyolysis. Correlate clinically. Follow up CK levels recommended. Electronically Signed   By: Richardean Sale M.D.   On: 02/22/2015 11:01   Dg Foot Complete Left  02/22/2015  CLINICAL DATA:  Bilateral foot pain EXAM: LEFT FOOT - COMPLETE 3+ VIEW COMPARISON:  None. FINDINGS: There is no evidence of fracture or dislocation. There is no evidence of arthropathy or other focal bone abnormality. Soft tissues are unremarkable. IMPRESSION: No acute abnormality noted. Electronically Signed   By: Inez Catalina M.D.   On: 02/22/2015 08:55   Dg Foot Complete Right  02/22/2015  CLINICAL  DATA:  Bilateral foot pain EXAM: RIGHT FOOT COMPLETE - 3+ VIEW COMPARISON:  None. FINDINGS: There is no evidence of fracture or dislocation. There is no evidence of arthropathy or other focal bone abnormality. Soft tissues are unremarkable. IMPRESSION: No acute abnormality noted. Electronically Signed   By: Inez Catalina M.D.   On: 02/22/2015 08:55    ASSESSMENT AND PLAN 1. Elevated Troponin > 65, with CPK 38.000, possible myocytis - occurring in the setting of IVDA. - recently admitted from 11/08/2014 - 11/16/2014 for acute hypoxic respiratory failure, septic shock, acute kidney injury, and traumatic rhabdomyolysis secondary to a heroin overdose and trauma sustained during the overdose. Troponin peaked at 2.34 that admission. Initial EF was 35-40% but improved to 55-60% prior to discharge.  - cath normal ., most probably cocaine induced vasospasm, LVEF preserved this time, Today new Q wave in the anterior lead, we will repeat echo in 4-6 weeks.  2. Possible Endocarditis -  past echo in 11/2014 and this admission showed no evidence of endocarditis - Has been started on Aztreonam and Vancomycin for possible endocarditis. - leukocytosis improved this am -For TEE this afternoon. To rule out valvular lesions of endocarditis.  3. AKI - peaked at 5.24 on 11/13/2014 last admission - 2.70 --> 1.4 today  4. IVDA - according to the patient, he last used Heroin two days PTA, cocaine the night of admission, drug screen also positice for benzodiazepines and marijuana.  5. Bilateral Hip Pain, feet pain - negative X rays  6. Rhabdomyalysis improved with IV hydration.  Plan: TEE today. Risks and benefits discussed with patient and father.  Agree to proceed.   Signed, Warren Danes MD

## 2015-02-24 NOTE — Interval H&P Note (Signed)
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 After consideration of risks, benefits and other options for treatment, the patient has consented to  Procedure(s): TRANSESOPHAGEAL ECHOCARDIOGRAM (TEE) (N/A) as a surgical intervention .  The patient's history has been reviewed, patient examined, no change in status, stable for surgery.  I have reviewed the patient's chart and labs.  Questions were answered to the patient's satisfaction.     Sharol Harness, MD 02/24/2015  3:04 PM

## 2015-02-24 NOTE — CV Procedure (Signed)
Brief TEE Note  LVEF >55% No thrombus or vegetation. No valvular regurgitation or stenosis.  Rolla Servidio C. Oval Linsey, MD, [facility]  02/24/2015  3:37 PM

## 2015-02-24 NOTE — Progress Notes (Signed)
Patient Name: Erik [name] Date of Encounter: 02/24/2015     Principal Problem:   Lower extremity pain, bilateral Active Problems:   IVDU (intravenous drug user)   Hyperkalemia   Elevated troponin   Leukocytosis   Acute renal failure due to rhabdomyolysis [facility])   Rhabdomyolysis   NSTEMI (non-ST elevated myocardial infarction) ([place])   IV drug abuse   Cocaine abuse    SUBJECTIVE  The patient is feeling better. No chest pain. Temp 100 earlier today. NPO for TEE today. Reason for TEE discussed with patient and his father in room today.  CURRENT MEDS . aspirin EC  81 mg Oral Daily  . aztreonam  1 g Intravenous Q8H  . carvedilol  3.125 mg Oral BID WC  . Chlorhexidine Gluconate Cloth  6 each Topical Q0600  . folic acid  1 mg Oral Daily  . heparin subcutaneous  5,000 Units Subcutaneous 3 times per day  . multivitamin with minerals  1 tablet Oral Daily  . mupirocin ointment  1 application Nasal BID  . nicotine  21 mg Transdermal Daily  . senna-docusate  2 tablet Oral QHS  . sodium chloride  3 mL Intravenous Q12H  . sodium chloride  3 mL Intravenous Q12H  . thiamine  100 mg Oral Daily  . vancomycin  500 mg Intravenous Q12H    OBJECTIVE  Filed Vitals:   02/23/15 2247 02/24/15 0000 02/24/15 0500 02/24/15 0730  BP:  125/78 124/72 121/67  Pulse:  61 60 56  Temp:  98.6 F (37 C) 100 F (37.8 C) 98.1 F (36.7 C)  TempSrc:  Oral Oral Oral  Resp:  16 14 15   Height:      Weight:      SpO2: 99% 97% 97% 96%    Intake/Output Summary (Last 24 hours) at 02/24/15 1050 Last data filed at 02/24/15 0800  Gross per 24 hour  Intake 2608.75 ml  Output   3775 ml  Net -1166.25 ml   Filed Weights   02/22/15 2330  Weight: 139 lb 1.8 oz (63.1 kg)    PHYSICAL EXAM  General: Pleasant, NAD. Neuro: Alert and oriented X 3. Moves all extremities spontaneously. Psych: Normal affect. HEENT:  Normal  Neck: Supple without bruits or JVD. Lungs:  Resp regular and unlabored,  CTA. Heart: RRR no s3, s4, or murmurs. Abdomen: Soft, non-tender, non-distended, BS + x 4.    Accessory Clinical Findings  CBC  Recent Labs  02/22/15 0509 02/23/15 0406 02/24/15 0451  WBC 20.9* 10.5 7.8  NEUTROABS 18.1*  --   --   HGB 16.1 13.3 13.9  HCT 47.8 39.2 41.4  MCV 89.7 88.5 89.2  PLT 240 186 0000000   Basic Metabolic Panel  Recent Labs  02/23/15 0406 02/24/15 0451  NA 136 140  K 4.5 4.1  CL 110 107  CO2 21* 26  GLUCOSE 117* 113*  BUN 36* 13  CREATININE 2.04* 1.40*  CALCIUM 8.0* 8.8*   Liver Function Tests  Recent Labs  02/24/15 0451  AST 415*  ALT 246*  ALKPHOS 56  BILITOT 0.8  PROT 5.7*  ALBUMIN 2.8*   No results for input(s): LIPASE, AMYLASE in the last 72 hours. Cardiac Enzymes  Recent Labs  02/22/15 1207 02/22/15 1925 02/22/15 2145 02/23/15 0406 02/24/15 0451  CKTOTAL 16109*  --   --  [id number]* 9490*  TROPONINI >65.00* >65.00* 61.88*  --   --    BNP Invalid input(s): POCBNP D-Dimer No results for input(s): DDIMER  in the last 72 hours. Hemoglobin A1C No results for input(s): HGBA1C in the last 72 hours. Fasting Lipid Panel  Recent Labs  02/23/15 0406  CHOL 138  HDL 32*  LDLCALC 71  TRIG 173*  CHOLHDL 4.3   Thyroid Function Tests No results for input(s): TSH, T4TOTAL, T3FREE, THYROIDAB in the last 72 hours.  Invalid input(s): FREET3  TELE  NSR  ECG    Radiology/Studies  Dg Chest 2 View  02/22/2015  CLINICAL DATA:  Shortness of Breath EXAM: CHEST - 2 VIEW COMPARISON:  None. FINDINGS: The heart size and mediastinal contours are within normal limits. Both lungs are clear. The visualized skeletal structures are unremarkable. IMPRESSION: No active disease. Electronically Signed   By: Inez Catalina M.D.   On: 02/22/2015 08:54   Mr Thoracic Spine W Wo Contrast  02/22/2015  CLINICAL DATA:  19 year old with recent hospitalization for narcotic induced respiratory failure. Patient awoke this morning with severe foot pain,  bilateral leg weakness and inability to urinate. History of hepatitis B. EXAM: MRI THORACIC AND LUMBAR SPINE WITHOUT AND WITH CONTRAST TECHNIQUE: Multiplanar and multiecho pulse sequences of the thoracic and lumbar spine were obtained without and with intravenous contrast. CONTRAST:  6mL MULTIHANCE GADOBENATE DIMEGLUMINE 529 MG/ML IV SOLN COMPARISON:  CT of the chest, abdomen and pelvis 02/27/2014. Chest radiographs today. FINDINGS: MR THORACIC SPINE FINDINGS Alignment:  Normal. Bones: No acute or suspicious osseous findings. There is no abnormal osseous or endplate enhancement. Cord: The thoracic cord appears normal in signal and caliber. No abnormal intradural enhancement. Paraspinal and other soft tissues: No significant paraspinal abnormalities. There is concern of patchy airspace disease at both lung bases, worse on the left. No significant pleural effusion. Disc levels: All of the thoracic discs appear normal. There is no evidence of disc herniation, spinal stenosis or nerve root encroachment. There is no evidence of discitis. MR LUMBAR SPINE FINDINGS Segmentation: Conventional anatomy assumed, with the last open disc space designated L5-S1. Alignment:  Normal. Bones: No worrisome osseous lesion, acute fracture or pars defect. No evidence of discitis or osteomyelitis. The lumbar pedicles are somewhat short on a congenital basis. The sacroiliac joints appear normal. Conus medullaris: Extends to the L1 level and appears normal. There is no abnormal intradural enhancement. Paraspinal and other soft tissues: No significant paraspinal findings. On the lower axial images, there is asymmetric T2 hyperintensity throughout the right gluteus musculature. There is also asymmetric enhancement in this area on the post-contrast images. No focal fluid collection identified. Disc levels: All of the lumbar discs are well hydrated. There is no disc herniation, spinal stenosis or nerve root encroachment. No evidence of discitis  or osteomyelitis. IMPRESSION: 1. Negative MRI of the thoracolumbar spine. No evidence of disc herniation, spinal stenosis, discitis or osteomyelitis. The thoracic cord and conus medullaris appear normal. 2. Concern of patchy airspace opacities at both lung bases, worrisome for early aspiration in this clinical context. Chest radiographic follow up recommended. 3. T2 hyperintensity and enhancement in the right gluteus musculature, incompletely visualized. In this clinical context, this could indicate rhabdomyolysis. Correlate clinically. Follow up CK levels recommended. Electronically Signed   By: Richardean Sale M.D.   On: 02/22/2015 11:01   Mr Lumbar Spine W Wo Contrast  02/22/2015  CLINICAL DATA:  19 year old with recent hospitalization for narcotic induced respiratory failure. Patient awoke this morning with severe foot pain, bilateral leg weakness and inability to urinate. History of hepatitis B. EXAM: MRI THORACIC AND LUMBAR SPINE WITHOUT AND WITH CONTRAST TECHNIQUE:  Multiplanar and multiecho pulse sequences of the thoracic and lumbar spine were obtained without and with intravenous contrast. CONTRAST:  71mL MULTIHANCE GADOBENATE DIMEGLUMINE 529 MG/ML IV SOLN COMPARISON:  CT of the chest, abdomen and pelvis 02/27/2014. Chest radiographs today. FINDINGS: MR THORACIC SPINE FINDINGS Alignment:  Normal. Bones: No acute or suspicious osseous findings. There is no abnormal osseous or endplate enhancement. Cord: The thoracic cord appears normal in signal and caliber. No abnormal intradural enhancement. Paraspinal and other soft tissues: No significant paraspinal abnormalities. There is concern of patchy airspace disease at both lung bases, worse on the left. No significant pleural effusion. Disc levels: All of the thoracic discs appear normal. There is no evidence of disc herniation, spinal stenosis or nerve root encroachment. There is no evidence of discitis. MR LUMBAR SPINE FINDINGS Segmentation: Conventional  anatomy assumed, with the last open disc space designated L5-S1. Alignment:  Normal. Bones: No worrisome osseous lesion, acute fracture or pars defect. No evidence of discitis or osteomyelitis. The lumbar pedicles are somewhat short on a congenital basis. The sacroiliac joints appear normal. Conus medullaris: Extends to the L1 level and appears normal. There is no abnormal intradural enhancement. Paraspinal and other soft tissues: No significant paraspinal findings. On the lower axial images, there is asymmetric T2 hyperintensity throughout the right gluteus musculature. There is also asymmetric enhancement in this area on the post-contrast images. No focal fluid collection identified. Disc levels: All of the lumbar discs are well hydrated. There is no disc herniation, spinal stenosis or nerve root encroachment. No evidence of discitis or osteomyelitis. IMPRESSION: 1. Negative MRI of the thoracolumbar spine. No evidence of disc herniation, spinal stenosis, discitis or osteomyelitis. The thoracic cord and conus medullaris appear normal. 2. Concern of patchy airspace opacities at both lung bases, worrisome for early aspiration in this clinical context. Chest radiographic follow up recommended. 3. T2 hyperintensity and enhancement in the right gluteus musculature, incompletely visualized. In this clinical context, this could indicate rhabdomyolysis. Correlate clinically. Follow up CK levels recommended. Electronically Signed   By: Richardean Sale M.D.   On: 02/22/2015 11:01   Dg Foot Complete Left  02/22/2015  CLINICAL DATA:  Bilateral foot pain EXAM: LEFT FOOT - COMPLETE 3+ VIEW COMPARISON:  None. FINDINGS: There is no evidence of fracture or dislocation. There is no evidence of arthropathy or other focal bone abnormality. Soft tissues are unremarkable. IMPRESSION: No acute abnormality noted. Electronically Signed   By: Inez Catalina M.D.   On: 02/22/2015 08:55   Dg Foot Complete Right  02/22/2015  CLINICAL  DATA:  Bilateral foot pain EXAM: RIGHT FOOT COMPLETE - 3+ VIEW COMPARISON:  None. FINDINGS: There is no evidence of fracture or dislocation. There is no evidence of arthropathy or other focal bone abnormality. Soft tissues are unremarkable. IMPRESSION: No acute abnormality noted. Electronically Signed   By: Inez Catalina M.D.   On: 02/22/2015 08:55    ASSESSMENT AND PLAN 1. Elevated Troponin > 65, with CPK 38.000, possible myocytis - occurring in the setting of IVDA. - recently admitted from 11/08/2014 - 11/16/2014 for acute hypoxic respiratory failure, septic shock, acute kidney injury, and traumatic rhabdomyolysis secondary to a heroin overdose and trauma sustained during the overdose. Troponin peaked at 2.34 that admission. Initial EF was 35-40% but improved to 55-60% prior to discharge.  - cath normal ., most probably cocaine induced vasospasm, LVEF preserved this time, Today new Q wave in the anterior lead, we will repeat echo in 4-6 weeks.  2. Possible Endocarditis -  past echo in 11/2014 and this admission showed no evidence of endocarditis - Has been started on Aztreonam and Vancomycin for possible endocarditis. - leukocytosis improved this am -For TEE this afternoon. To rule out valvular lesions of endocarditis.  3. AKI - peaked at 5.24 on 11/13/2014 last admission - 2.70 --> 1.4 today  4. IVDA - according to the patient, he last used Heroin two days PTA, cocaine the night of admission, drug screen also positice for benzodiazepines and marijuana.  5. Bilateral Hip Pain, feet pain - negative X rays  6. Rhabdomyalysis improved with IV hydration.  Plan: TEE today. Risks and benefits discussed with patient and father.  Agree to proceed.   Signed, Warren Danes MD

## 2015-02-25 ENCOUNTER — Encounter (HOSPITAL_COMMUNITY): Payer: Self-pay | Admitting: Cardiovascular Disease

## 2015-02-25 LAB — COMPREHENSIVE METABOLIC PANEL
ALBUMIN: 2.8 g/dL — AB (ref 3.5–5.0)
ALK PHOS: 55 U/L (ref 38–126)
ALT: 215 U/L — AB (ref 17–63)
AST: 329 U/L — ABNORMAL HIGH (ref 15–41)
Anion gap: 8 (ref 5–15)
BUN: 11 mg/dL (ref 6–20)
CALCIUM: 9.2 mg/dL (ref 8.9–10.3)
CO2: 26 mmol/L (ref 22–32)
CREATININE: 1.33 mg/dL — AB (ref 0.61–1.24)
Chloride: 103 mmol/L (ref 101–111)
GFR calc Af Amer: >60 mL/min (ref >60)
GFR calc non Af Amer: >60 mL/min (ref >60)
GLUCOSE: 115 mg/dL — AB (ref 65–99)
Potassium: 4.2 mmol/L (ref 3.5–5.1)
SODIUM: 137 mmol/L (ref 135–145)
Total Bilirubin: 0.6 mg/dL (ref 0.3–1.2)
Total Protein: 6 g/dL — ABNORMAL LOW (ref 6.5–8.1)

## 2015-02-25 LAB — CBC
HCT: 42.7 % (ref 39.0–52.0)
HEMOGLOBIN: 14.8 g/dL (ref 13.0–17.0)
MCH: 30.5 pg (ref 26.0–34.0)
MCHC: 34.7 g/dL (ref 30.0–36.0)
MCV: 87.9 fL (ref 78.0–100.0)
Platelets: 223 10*3/uL (ref 150–400)
RBC: 4.86 MIL/uL (ref 4.22–5.81)
RDW: 12.1 % (ref 11.5–15.5)
WBC: 7.4 10*3/uL (ref 4.0–10.5)

## 2015-02-25 LAB — CK: Total CK: 5921 U/L — ABNORMAL HIGH (ref 49–397)

## 2015-02-25 NOTE — Progress Notes (Signed)
PATIENT DETAILS Name: Erik [name] Age: 19 y.o. Sex: male Date of Birth: 01-31-1996 Admit Date: 02/22/2015 Admitting Physician Ripudeep Krystal Eaton, MD [id number] Asa Lente, MD  Subjective: Left leg pain better-right leg pain/erythema essentially the same.   Assessment/Plan: Principal Problem: NSTEMI: Secondary to cocaine-induced vasospasm. Underwent LHC on 12/17 which showed normal coronary arteries. Continue aspirin, Coreg. Cardiology following.  Active Problems: Rhabdomyolysis: Suspect that this is nontraumatic and nonexertional-likely from cocaine/heroin use. Continue with hydration, CK  downtrending.   Acute urinary retention: Suspect from pain, no major abnormality seen on MRI spine.  Foley catheter inserted on admission, voiding trial successful on 12/19-voiding spontaneously  ARF:  likely secondary to rhabdomyolysis/acute urinary retention. Creatinine downtrending with IV fluids. Follow.  Cellulitis Right Foot plantar surface/Left foot mid dorsum: right foot much improved, left foot stable-still with erythema in the right plantar surface-continue Vancomycin-stop Azactam  Leukocytosis:  MRI thoracic or lumbar spine negative for discitis/abscess. TEE negative for endocarditis.Blood cultures negative so far. Leukocytosis has resolved-could have been reactive to rhabdomyolysis or from cellulitis of right foot. Continue Vancomycin, will d/c Azactam as blood cultures negative.  Hyperkalemia: Resolved, likely secondary to rhabdomyolysis/renal failure   Polysubstance abuse/IVDA:  Counseled-however felt embarrassed and thought I was condescending. Refuses psychiatric evaluation. Will consult social work for further counseling.  Disposition: Remain inpatient-home tomorrow? If clinical improvement continues  Antimicrobial agents  See below  Anti-infectives    Start     Dose/Rate Route Frequency Ordered Stop   02/22/15 2000  vancomycin (VANCOCIN) 500 mg in sodium  chloride 0.9 % 100 mL IVPB     500 mg 100 mL/hr over 60 Minutes Intravenous Every 12 hours 02/22/15 1055     02/22/15 1900  vancomycin (VANCOCIN) 500 mg in sodium chloride 0.9 % 100 mL IVPB  Status:  Discontinued     500 mg 100 mL/hr over 60 Minutes Intravenous Every 12 hours 02/22/15 0745 02/22/15 0749   02/22/15 1030  aztreonam (AZACTAM) 1 g in dextrose 5 % 50 mL IVPB  Status:  Discontinued     1 g 100 mL/hr over 30 Minutes Intravenous Every 8 hours 02/22/15 1010 02/25/15 0842   02/22/15 0700  vancomycin (VANCOCIN) 1,250 mg in sodium chloride 0.9 % 250 mL IVPB     1,250 mg 166.7 mL/hr over 90 Minutes Intravenous  Once 02/22/15 0656 02/22/15 0911   02/22/15 0700  ceFEPIme (MAXIPIME) 2 g in dextrose 5 % 50 mL IVPB  Status:  Discontinued     2 g 100 mL/hr over 30 Minutes Intravenous  Once 02/22/15 0656 02/22/15 0955      DVT Prophylaxis: Prophylactic Heparin  Code Status: Full code   Family Communication None at bedside  Procedures: 12/17>>LHC  CONSULTS:  cardiology  Time spent 30 minutes-Greater than 50% of this time was spent in counseling, explanation of diagnosis, planning of further management, and coordination of care.  MEDICATIONS: Scheduled Meds: . aspirin EC  81 mg Oral Daily  . carvedilol  3.125 mg Oral BID WC  . Chlorhexidine Gluconate Cloth  6 each Topical Q0600  . folic acid  1 mg Oral Daily  . heparin subcutaneous  5,000 Units Subcutaneous 3 times per day  . multivitamin with minerals  1 tablet Oral Daily  . mupirocin ointment  1 application Nasal BID  . nicotine  21 mg Transdermal Daily  . senna-docusate  2 tablet Oral QHS  . sodium chloride  3 mL Intravenous Q12H  . sodium chloride  3 mL Intravenous Q12H  . thiamine  100 mg Oral Daily  . vancomycin  500 mg Intravenous Q12H   Continuous Infusions: . sodium chloride 1,000 mL (02/25/15 0928)   PRN Meds:.sodium chloride, acetaminophen, alum & mag hydroxide-simeth, HYDROmorphone (DILAUDID) injection,  LORazepam, ondansetron (ZOFRAN) IV, oxyCODONE, sodium chloride, traZODone    PHYSICAL EXAM: Vital signs in last 24 hours: Filed Vitals:   02/24/15 2100 02/25/15 0524 02/25/15 0922 02/25/15 1158  BP: 123/72 120/65 114/58 126/71  Pulse: 58 57  53  Temp: 98 F (36.7 C) 98.3 F (36.8 C)  98.7 F (37.1 C)  TempSrc: Oral Oral  Oral  Resp: 20 16  18   Height:      Weight:  61.78 kg (136 lb 3.2 oz)    SpO2: 99% 96%  98%    Weight change:  Filed Weights   02/22/15 2330 02/24/15 1849 02/25/15 0524  Weight: 63.1 kg (139 lb 1.8 oz) 60.646 kg (133 lb 11.2 oz) 61.78 kg (136 lb 3.2 oz)   Body mass index is 21.99 kg/(m^2).   Gen Exam: Awake and alert with clear speech.   Neck: Supple, No JVD.   Chest: B/L Clear.   CVS: S1 S2 Regular, no murmurs.  Abdomen: soft, BS +, non tender, non distended.  Extremities: no edema, lower extremities warm to touch.Mild tender to palpation in plantar surface of both his feet-erythematous area in mid plantar surface of the right foot-still tender. Neurologic: LLE 5/5-RLE 4/5-limited due to pain Skin: No Rash.   Wounds: N/A.   Intake/Output from previous day:  Intake/Output Summary (Last 24 hours) at 02/25/15 1225 Last data filed at 02/25/15 1159  Gross per 24 hour  Intake   1650 ml  Output   3700 ml  Net  -2050 ml     LAB RESULTS: CBC  Recent Labs Lab 02/22/15 0509 02/23/15 0406 02/24/15 0451 02/25/15 0605  WBC 20.9* 10.5 7.8 7.4  HGB 16.1 13.3 13.9 14.8  HCT 47.8 39.2 41.4 42.7  PLT 240 186 204 223  MCV 89.7 88.5 89.2 87.9  MCH 30.2 30.0 30.0 30.5  MCHC 33.7 33.9 33.6 34.7  RDW 12.4 12.5 12.2 12.1  LYMPHSABS 1.8  --   --   --   MONOABS 0.9  --   --   --   EOSABS 0.0  --   --   --   BASOSABS 0.0  --   --   --     Chemistries   Recent Labs Lab 02/22/15 0509 02/22/15 1202 02/23/15 0406 02/24/15 0451 02/25/15 0605  NA 136 135 136 140 137  K 6.1* 4.7 4.5 4.1 4.2  CL 101 105 110 107 103  CO2 18* 20* 21* 26 26  GLUCOSE  142* 77 117* 113* 115*  BUN 59* 55* 36* 13 11  CREATININE 2.70* 2.45* 2.04* 1.40* 1.33*  CALCIUM 7.9* 8.1* 8.0* 8.8* 9.2    CBG: No results for input(s): GLUCAP in the last 168 hours.  GFR Estimated Creatinine Clearance: 76.8 mL/min (by C-G formula based on Cr of 1.33).  Coagulation profile No results for input(s): INR, PROTIME in the last 168 hours.  Cardiac Enzymes  Recent Labs Lab 02/22/15 1207 02/22/15 1925 02/22/15 2145  TROPONINI >65.00* >65.00* 61.88*    Invalid input(s): POCBNP No results for input(s): DDIMER in the last 72 hours. No results for input(s): HGBA1C in the last 72 hours.  Recent Labs  02/23/15 0406  CHOL 138  HDL 32*  LDLCALC 71  TRIG 173*  CHOLHDL 4.3   No results for input(s): TSH, T4TOTAL, T3FREE, THYROIDAB in the last 72 hours.  Invalid input(s): FREET3 No results for input(s): VITAMINB12, FOLATE, FERRITIN, TIBC, IRON, RETICCTPCT in the last 72 hours. No results for input(s): LIPASE, AMYLASE in the last 72 hours.  Urine Studies No results for input(s): UHGB, CRYS in the last 72 hours.  Invalid input(s): UACOL, UAPR, USPG, UPH, UTP, UGL, UKET, UBIL, UNIT, UROB, ULEU, UEPI, UWBC, URBC, UBAC, CAST, UCOM, BILUA  MICROBIOLOGY: Recent Results (from the past 240 hour(s))  Blood culture (routine x 2)     Status: None (Preliminary result)   Collection Time: 02/22/15  5:10 AM  Result Value Ref Range Status   Specimen Description BLOOD RIGHT FOREARM  Final   Special Requests BOTTLES DRAWN AEROBIC AND ANAEROBIC 5ML  Final   Culture   Final    NO GROWTH 2 DAYS Performed at [facility]    Report Status PENDING  Incomplete  Blood culture (routine x 2)     Status: None (Preliminary result)   Collection Time: 02/22/15  5:20 AM  Result Value Ref Range Status   Specimen Description BLOOD LEFT HAND  Final   Special Requests IN PEDIATRIC BOTTLE 2ML  Final   Culture   Final    NO GROWTH 2 DAYS Performed at [facility]    Report  Status PENDING  Incomplete  Culture, Urine     Status: None   Collection Time: 02/22/15  7:19 AM  Result Value Ref Range Status   Specimen Description URINE, RANDOM  Final   Special Requests CX ADDED AT 0309 ON [id number]  Final   Culture NO GROWTH 1 DAY  Final   Report Status 02/24/2015 FINAL  Final  Culture, blood (single)     Status: None (Preliminary result)   Collection Time: 02/22/15  7:46 AM  Result Value Ref Range Status   Specimen Description BLOOD RIGHT HAND  Final   Special Requests BOTTLES DRAWN AEROBIC AND ANAEROBIC 5CC  Final   Culture NO GROWTH 2 DAYS  Final   Report Status PENDING  Incomplete  MRSA PCR Screening     Status: Abnormal   Collection Time: 02/22/15  5:05 PM  Result Value Ref Range Status   MRSA by PCR POSITIVE (A) NEGATIVE Final    Comment:        The GeneXpert MRSA Assay (FDA approved for NASAL specimens only), is one component of a comprehensive MRSA colonization surveillance program. It is not intended to diagnose MRSA infection nor to guide or monitor treatment for MRSA infections. RESULT CALLED TO, READ BACK BY AND VERIFIED WITH: J.PERKINS,RN 02/22/15 @2010  BY V.WILKINS     RADIOLOGY STUDIES/RESULTS: Dg Chest 2 View  02/22/2015  CLINICAL DATA:  Shortness of Breath EXAM: CHEST - 2 VIEW COMPARISON:  None. FINDINGS: The heart size and mediastinal contours are within normal limits. Both lungs are clear. The visualized skeletal structures are unremarkable. IMPRESSION: No active disease. Electronically Signed   By: Inez Catalina M.D.   On: 02/22/2015 08:54   Mr Thoracic Spine W Wo Contrast  02/22/2015  CLINICAL DATA:  19 year old with recent hospitalization for narcotic induced respiratory failure. Patient awoke this morning with severe foot pain, bilateral leg weakness and inability to urinate. History of hepatitis B. EXAM: MRI THORACIC AND LUMBAR SPINE WITHOUT AND WITH CONTRAST TECHNIQUE: Multiplanar and multiecho pulse sequences of the thoracic and  lumbar  spine were obtained without and with intravenous contrast. CONTRAST:  56mL MULTIHANCE GADOBENATE DIMEGLUMINE 529 MG/ML IV SOLN COMPARISON:  CT of the chest, abdomen and pelvis 02/27/2014. Chest radiographs today. FINDINGS: MR THORACIC SPINE FINDINGS Alignment:  Normal. Bones: No acute or suspicious osseous findings. There is no abnormal osseous or endplate enhancement. Cord: The thoracic cord appears normal in signal and caliber. No abnormal intradural enhancement. Paraspinal and other soft tissues: No significant paraspinal abnormalities. There is concern of patchy airspace disease at both lung bases, worse on the left. No significant pleural effusion. Disc levels: All of the thoracic discs appear normal. There is no evidence of disc herniation, spinal stenosis or nerve root encroachment. There is no evidence of discitis. MR LUMBAR SPINE FINDINGS Segmentation: Conventional anatomy assumed, with the last open disc space designated L5-S1. Alignment:  Normal. Bones: No worrisome osseous lesion, acute fracture or pars defect. No evidence of discitis or osteomyelitis. The lumbar pedicles are somewhat short on a congenital basis. The sacroiliac joints appear normal. Conus medullaris: Extends to the L1 level and appears normal. There is no abnormal intradural enhancement. Paraspinal and other soft tissues: No significant paraspinal findings. On the lower axial images, there is asymmetric T2 hyperintensity throughout the right gluteus musculature. There is also asymmetric enhancement in this area on the post-contrast images. No focal fluid collection identified. Disc levels: All of the lumbar discs are well hydrated. There is no disc herniation, spinal stenosis or nerve root encroachment. No evidence of discitis or osteomyelitis. IMPRESSION: 1. Negative MRI of the thoracolumbar spine. No evidence of disc herniation, spinal stenosis, discitis or osteomyelitis. The thoracic cord and conus medullaris appear normal. 2.  Concern of patchy airspace opacities at both lung bases, worrisome for early aspiration in this clinical context. Chest radiographic follow up recommended. 3. T2 hyperintensity and enhancement in the right gluteus musculature, incompletely visualized. In this clinical context, this could indicate rhabdomyolysis. Correlate clinically. Follow up CK levels recommended. Electronically Signed   By: Richardean Sale M.D.   On: 02/22/2015 11:01   Mr Lumbar Spine W Wo Contrast  02/22/2015  CLINICAL DATA:  19 year old with recent hospitalization for narcotic induced respiratory failure. Patient awoke this morning with severe foot pain, bilateral leg weakness and inability to urinate. History of hepatitis B. EXAM: MRI THORACIC AND LUMBAR SPINE WITHOUT AND WITH CONTRAST TECHNIQUE: Multiplanar and multiecho pulse sequences of the thoracic and lumbar spine were obtained without and with intravenous contrast. CONTRAST:  58mL MULTIHANCE GADOBENATE DIMEGLUMINE 529 MG/ML IV SOLN COMPARISON:  CT of the chest, abdomen and pelvis 02/27/2014. Chest radiographs today. FINDINGS: MR THORACIC SPINE FINDINGS Alignment:  Normal. Bones: No acute or suspicious osseous findings. There is no abnormal osseous or endplate enhancement. Cord: The thoracic cord appears normal in signal and caliber. No abnormal intradural enhancement. Paraspinal and other soft tissues: No significant paraspinal abnormalities. There is concern of patchy airspace disease at both lung bases, worse on the left. No significant pleural effusion. Disc levels: All of the thoracic discs appear normal. There is no evidence of disc herniation, spinal stenosis or nerve root encroachment. There is no evidence of discitis. MR LUMBAR SPINE FINDINGS Segmentation: Conventional anatomy assumed, with the last open disc space designated L5-S1. Alignment:  Normal. Bones: No worrisome osseous lesion, acute fracture or pars defect. No evidence of discitis or osteomyelitis. The lumbar  pedicles are somewhat short on a congenital basis. The sacroiliac joints appear normal. Conus medullaris: Extends to the L1 level and appears normal. There is no  abnormal intradural enhancement. Paraspinal and other soft tissues: No significant paraspinal findings. On the lower axial images, there is asymmetric T2 hyperintensity throughout the right gluteus musculature. There is also asymmetric enhancement in this area on the post-contrast images. No focal fluid collection identified. Disc levels: All of the lumbar discs are well hydrated. There is no disc herniation, spinal stenosis or nerve root encroachment. No evidence of discitis or osteomyelitis. IMPRESSION: 1. Negative MRI of the thoracolumbar spine. No evidence of disc herniation, spinal stenosis, discitis or osteomyelitis. The thoracic cord and conus medullaris appear normal. 2. Concern of patchy airspace opacities at both lung bases, worrisome for early aspiration in this clinical context. Chest radiographic follow up recommended. 3. T2 hyperintensity and enhancement in the right gluteus musculature, incompletely visualized. In this clinical context, this could indicate rhabdomyolysis. Correlate clinically. Follow up CK levels recommended. Electronically Signed   By: Richardean Sale M.D.   On: 02/22/2015 11:01   Dg Foot Complete Left  02/22/2015  CLINICAL DATA:  Bilateral foot pain EXAM: LEFT FOOT - COMPLETE 3+ VIEW COMPARISON:  None. FINDINGS: There is no evidence of fracture or dislocation. There is no evidence of arthropathy or other focal bone abnormality. Soft tissues are unremarkable. IMPRESSION: No acute abnormality noted. Electronically Signed   By: Inez Catalina M.D.   On: 02/22/2015 08:55   Dg Foot Complete Right  02/22/2015  CLINICAL DATA:  Bilateral foot pain EXAM: RIGHT FOOT COMPLETE - 3+ VIEW COMPARISON:  None. FINDINGS: There is no evidence of fracture or dislocation. There is no evidence of arthropathy or other focal bone  abnormality. Soft tissues are unremarkable. IMPRESSION: No acute abnormality noted. Electronically Signed   By: Inez Catalina M.D.   On: 02/22/2015 08:55    Oren Binet, MD  [facility] Pager:[phone]  If 7PM-7AM, please contact night-coverage [email or web address] Password [email or web address] 02/25/2015, 12:25 PM   LOS: 3 days

## 2015-02-25 NOTE — Progress Notes (Signed)
Patient Name: Erik [name] Date of Encounter: 02/25/2015     Principal Problem:   Lower extremity pain, bilateral Active Problems:   IVDU (intravenous drug user)   Hyperkalemia   Elevated troponin   Leukocytosis   Acute renal failure due to rhabdomyolysis [facility])   Rhabdomyolysis   NSTEMI (non-ST elevated myocardial infarction) ([place])   IV drug abuse   Cocaine abuse    SUBJECTIVE  Afebrile. TEE yesterday was normal. No cardiac complaints.  CURRENT MEDS . aspirin EC  81 mg Oral Daily  . carvedilol  3.125 mg Oral BID WC  . Chlorhexidine Gluconate Cloth  6 each Topical Q0600  . folic acid  1 mg Oral Daily  . heparin subcutaneous  5,000 Units Subcutaneous 3 times per day  . multivitamin with minerals  1 tablet Oral Daily  . mupirocin ointment  1 application Nasal BID  . nicotine  21 mg Transdermal Daily  . senna-docusate  2 tablet Oral QHS  . sodium chloride  3 mL Intravenous Q12H  . sodium chloride  3 mL Intravenous Q12H  . thiamine  100 mg Oral Daily  . vancomycin  500 mg Intravenous Q12H    OBJECTIVE  Filed Vitals:   02/24/15 1849 02/24/15 2100 02/25/15 0524 02/25/15 0922  BP:  123/72 120/65 114/58  Pulse:  58 57   Temp:  98 F (36.7 C) 98.3 F (36.8 C)   TempSrc:  Oral Oral   Resp:  20 16   Height: 5\' 6"  (1.676 m)     Weight: 133 lb 11.2 oz (60.646 kg)  136 lb 3.2 oz (61.78 kg)   SpO2:  99% 96%     Intake/Output Summary (Last 24 hours) at 02/25/15 1002 Last data filed at 02/25/15 0841  Gross per 24 hour  Intake   1700 ml  Output   3100 ml  Net  -1400 ml   Filed Weights   02/22/15 2330 02/24/15 1849 02/25/15 0524  Weight: 139 lb 1.8 oz (63.1 kg) 133 lb 11.2 oz (60.646 kg) 136 lb 3.2 oz (61.78 kg)    PHYSICAL EXAM  General: Pleasant, NAD. Neuro: Alert and oriented X 3. Moves all extremities spontaneously. Psych: Normal affect. HEENT:  Normal  Neck: Supple without bruits or JVD. Lungs:  Resp regular and unlabored, CTA. Heart: RRR no s3, s4,  or murmurs. Abdomen: Soft, non-tender, non-distended, BS + x 4.  Extremities: No clubbing, cyanosis or edema. DP/PT/Radials 2+ and equal bilaterally.  Accessory Clinical Findings  CBC  Recent Labs  02/24/15 0451 02/25/15 0605  WBC 7.8 7.4  HGB 13.9 14.8  HCT 41.4 42.7  MCV 89.2 87.9  PLT 204 [id number]   Basic Metabolic Panel  Recent Labs  02/24/15 0451 02/25/15 0605  NA 140 137  K 4.1 4.2  CL 107 103  CO2 26 26  GLUCOSE 113* 115*  BUN 13 11  CREATININE 1.40* 1.33*  CALCIUM 8.8* 9.2   Liver Function Tests  Recent Labs  02/24/15 0451 02/25/15 0605  AST 415* 329*  ALT 246* 215*  ALKPHOS 56 55  BILITOT 0.8 0.6  PROT 5.7* 6.0*  ALBUMIN 2.8* 2.8*   No results for input(s): LIPASE, AMYLASE in the last 72 hours. Cardiac Enzymes  Recent Labs  02/22/15 1207 02/22/15 1925 02/22/15 2145 02/23/15 0406 02/24/15 0451 02/25/15 0605  CKTOTAL 09811*  --   --  22790* 9490* 5921*  TROPONINI >65.00* >65.00* 61.88*  --   --   --    BNP  Invalid input(s): POCBNP D-Dimer No results for input(s): DDIMER in the last 72 hours. Hemoglobin A1C No results for input(s): HGBA1C in the last 72 hours. Fasting Lipid Panel  Recent Labs  02/23/15 0406  CHOL 138  HDL 32*  LDLCALC 71  TRIG 173*  CHOLHDL 4.3   Thyroid Function Tests No results for input(s): TSH, T4TOTAL, T3FREE, THYROIDAB in the last 72 hours.  Invalid input(s): FREET3  TELE  NSR  ECG    Radiology/Studies  Dg Chest 2 View  02/22/2015  CLINICAL DATA:  Shortness of Breath EXAM: CHEST - 2 VIEW COMPARISON:  None. FINDINGS: The heart size and mediastinal contours are within normal limits. Both lungs are clear. The visualized skeletal structures are unremarkable. IMPRESSION: No active disease. Electronically Signed   By: Inez Catalina M.D.   On: 02/22/2015 08:54   Mr Thoracic Spine W Wo Contrast  02/22/2015  CLINICAL DATA:  19 year old with recent hospitalization for narcotic induced respiratory failure.  Patient awoke this morning with severe foot pain, bilateral leg weakness and inability to urinate. History of hepatitis B. EXAM: MRI THORACIC AND LUMBAR SPINE WITHOUT AND WITH CONTRAST TECHNIQUE: Multiplanar and multiecho pulse sequences of the thoracic and lumbar spine were obtained without and with intravenous contrast. CONTRAST:  41mL MULTIHANCE GADOBENATE DIMEGLUMINE 529 MG/ML IV SOLN COMPARISON:  CT of the chest, abdomen and pelvis 02/27/2014. Chest radiographs today. FINDINGS: MR THORACIC SPINE FINDINGS Alignment:  Normal. Bones: No acute or suspicious osseous findings. There is no abnormal osseous or endplate enhancement. Cord: The thoracic cord appears normal in signal and caliber. No abnormal intradural enhancement. Paraspinal and other soft tissues: No significant paraspinal abnormalities. There is concern of patchy airspace disease at both lung bases, worse on the left. No significant pleural effusion. Disc levels: All of the thoracic discs appear normal. There is no evidence of disc herniation, spinal stenosis or nerve root encroachment. There is no evidence of discitis. MR LUMBAR SPINE FINDINGS Segmentation: Conventional anatomy assumed, with the last open disc space designated L5-S1. Alignment:  Normal. Bones: No worrisome osseous lesion, acute fracture or pars defect. No evidence of discitis or osteomyelitis. The lumbar pedicles are somewhat short on a congenital basis. The sacroiliac joints appear normal. Conus medullaris: Extends to the L1 level and appears normal. There is no abnormal intradural enhancement. Paraspinal and other soft tissues: No significant paraspinal findings. On the lower axial images, there is asymmetric T2 hyperintensity throughout the right gluteus musculature. There is also asymmetric enhancement in this area on the post-contrast images. No focal fluid collection identified. Disc levels: All of the lumbar discs are well hydrated. There is no disc herniation, spinal stenosis  or nerve root encroachment. No evidence of discitis or osteomyelitis. IMPRESSION: 1. Negative MRI of the thoracolumbar spine. No evidence of disc herniation, spinal stenosis, discitis or osteomyelitis. The thoracic cord and conus medullaris appear normal. 2. Concern of patchy airspace opacities at both lung bases, worrisome for early aspiration in this clinical context. Chest radiographic follow up recommended. 3. T2 hyperintensity and enhancement in the right gluteus musculature, incompletely visualized. In this clinical context, this could indicate rhabdomyolysis. Correlate clinically. Follow up CK levels recommended. Electronically Signed   By: Richardean Sale M.D.   On: 02/22/2015 11:01   Mr Lumbar Spine W Wo Contrast  02/22/2015  CLINICAL DATA:  19 year old with recent hospitalization for narcotic induced respiratory failure. Patient awoke this morning with severe foot pain, bilateral leg weakness and inability to urinate. History of hepatitis B. EXAM: MRI  THORACIC AND LUMBAR SPINE WITHOUT AND WITH CONTRAST TECHNIQUE: Multiplanar and multiecho pulse sequences of the thoracic and lumbar spine were obtained without and with intravenous contrast. CONTRAST:  56mL MULTIHANCE GADOBENATE DIMEGLUMINE 529 MG/ML IV SOLN COMPARISON:  CT of the chest, abdomen and pelvis 02/27/2014. Chest radiographs today. FINDINGS: MR THORACIC SPINE FINDINGS Alignment:  Normal. Bones: No acute or suspicious osseous findings. There is no abnormal osseous or endplate enhancement. Cord: The thoracic cord appears normal in signal and caliber. No abnormal intradural enhancement. Paraspinal and other soft tissues: No significant paraspinal abnormalities. There is concern of patchy airspace disease at both lung bases, worse on the left. No significant pleural effusion. Disc levels: All of the thoracic discs appear normal. There is no evidence of disc herniation, spinal stenosis or nerve root encroachment. There is no evidence of discitis. MR  LUMBAR SPINE FINDINGS Segmentation: Conventional anatomy assumed, with the last open disc space designated L5-S1. Alignment:  Normal. Bones: No worrisome osseous lesion, acute fracture or pars defect. No evidence of discitis or osteomyelitis. The lumbar pedicles are somewhat short on a congenital basis. The sacroiliac joints appear normal. Conus medullaris: Extends to the L1 level and appears normal. There is no abnormal intradural enhancement. Paraspinal and other soft tissues: No significant paraspinal findings. On the lower axial images, there is asymmetric T2 hyperintensity throughout the right gluteus musculature. There is also asymmetric enhancement in this area on the post-contrast images. No focal fluid collection identified. Disc levels: All of the lumbar discs are well hydrated. There is no disc herniation, spinal stenosis or nerve root encroachment. No evidence of discitis or osteomyelitis. IMPRESSION: 1. Negative MRI of the thoracolumbar spine. No evidence of disc herniation, spinal stenosis, discitis or osteomyelitis. The thoracic cord and conus medullaris appear normal. 2. Concern of patchy airspace opacities at both lung bases, worrisome for early aspiration in this clinical context. Chest radiographic follow up recommended. 3. T2 hyperintensity and enhancement in the right gluteus musculature, incompletely visualized. In this clinical context, this could indicate rhabdomyolysis. Correlate clinically. Follow up CK levels recommended. Electronically Signed   By: Richardean Sale M.D.   On: 02/22/2015 11:01   Dg Foot Complete Left  02/22/2015  CLINICAL DATA:  Bilateral foot pain EXAM: LEFT FOOT - COMPLETE 3+ VIEW COMPARISON:  None. FINDINGS: There is no evidence of fracture or dislocation. There is no evidence of arthropathy or other focal bone abnormality. Soft tissues are unremarkable. IMPRESSION: No acute abnormality noted. Electronically Signed   By: Inez Catalina M.D.   On: 02/22/2015 08:55    Dg Foot Complete Right  02/22/2015  CLINICAL DATA:  Bilateral foot pain EXAM: RIGHT FOOT COMPLETE - 3+ VIEW COMPARISON:  None. FINDINGS: There is no evidence of fracture or dislocation. There is no evidence of arthropathy or other focal bone abnormality. Soft tissues are unremarkable. IMPRESSION: No acute abnormality noted. Electronically Signed   By: Inez Catalina M.D.   On: 02/22/2015 08:55    ASSESSMENT AND PLAN  1. Elevated Troponin > 65, with CPK 38.000, possible myocytis - occurring in the setting of IVDA. - recently admitted from 11/08/2014 - 11/16/2014 for acute hypoxic respiratory failure, septic shock, acute kidney injury, and traumatic rhabdomyolysis secondary to a heroin overdose and trauma sustained during the overdose. Troponin peaked at 2.34 that admission. Initial EF was 35-40% but improved to 55-60% prior to discharge.  - cath normal ., most probably cocaine induced vasospasm, LVEF preserved this time,   2. Possible Endocarditis - past echo in  11/2014 and this admission showed no evidence of endocarditis - Has been started on Aztreonam and Vancomycin for possible endocarditis. - leukocytosis improved this am -TEE normal.  No endocarditis.  3. AKI - peaked at 5.24 on 11/13/2014 last admission - 2.70 --> 1.33 today  4. IVDA - according to the patient, he last used Heroin two days PTA, cocaine the night of admission, drug screen also positice for benzodiazepines and marijuana.  5. Bilateral Hip Pain, feet pain - negative X rays  6. Rhabdomyalysis improved with IV hydration.  Plan: Strongly advised to avoid illicit drugs going forward. Cardiology will sign off now.  Signed, Warren Danes MD

## 2015-02-26 LAB — COMPREHENSIVE METABOLIC PANEL
ALT: 177 U/L — ABNORMAL HIGH (ref 17–63)
ANION GAP: 7 (ref 5–15)
AST: 248 U/L — ABNORMAL HIGH (ref 15–41)
Albumin: 2.7 g/dL — ABNORMAL LOW (ref 3.5–5.0)
Alkaline Phosphatase: 49 U/L (ref 38–126)
BUN: 11 mg/dL (ref 6–20)
CHLORIDE: 107 mmol/L (ref 101–111)
CO2: 23 mmol/L (ref 22–32)
CREATININE: 0.99 mg/dL (ref 0.61–1.24)
Calcium: 9 mg/dL (ref 8.9–10.3)
Glucose, Bld: 103 mg/dL — ABNORMAL HIGH (ref 65–99)
POTASSIUM: 3.6 mmol/L (ref 3.5–5.1)
SODIUM: 137 mmol/L (ref 135–145)
Total Bilirubin: 0.5 mg/dL (ref 0.3–1.2)
Total Protein: 5.5 g/dL — ABNORMAL LOW (ref 6.5–8.1)

## 2015-02-26 LAB — PROCALCITONIN: PROCALCITONIN: 0.99 ng/mL

## 2015-02-26 LAB — CK: CK TOTAL: 3518 U/L — AB (ref 49–397)

## 2015-02-26 MED ORDER — OXYCODONE HCL 5 MG PO TABS: 5.0000 mg | ORAL_TABLET | Freq: Four times a day (QID) | ORAL | Status: DC | PRN

## 2015-02-26 MED ORDER — CARVEDILOL 3.125 MG PO TABS: 3.1250 mg | ORAL_TABLET | Freq: Two times a day (BID) | ORAL | Status: DC

## 2015-02-26 MED ORDER — DOXYCYCLINE HYCLATE 50 MG PO CAPS: 100.0000 mg | ORAL_CAPSULE | Freq: Two times a day (BID) | ORAL | Status: DC

## 2015-02-26 MED ORDER — ASPIRIN 81 MG PO TBEC: 81.0000 mg | DELAYED_RELEASE_TABLET | Freq: Every day | ORAL | Status: DC

## 2015-02-26 NOTE — Care Management Note (Signed)
Case Management Note  Patient Details  Name: Erik [name] MRN: [id number] Date of Birth: 1995-07-06  Subjective/Objective:     Admitted with LE pain               Action/Plan: Talked to patient about DCP. Patient lives with his room mates, has been in rehab for drug issues x 2 and now goes to outpatient counseling. Patient does not want to go back into rehab at this time and states that he knows what to do. CM had a long discussion with patient about making wise decisions.Lots of emotional support given. He does not want any Outpatient physical therapy. CM informed his father that if he changed his mind, his PCP can make arrangement from his office.  Expected Discharge Date:   02/26/2015     Expected Discharge Plan:  Home/Self Care  Discharge planning Services  CM Consult   Choice offered to:  Patient  Status of Service:  Completed, signed off  Sherrilyn Rist [id number] 02/26/2015, 10:33 AM

## 2015-02-26 NOTE — Progress Notes (Signed)
Physical Therapy Treatment Patient Details Name: Erik [name] MRN: [id number] DOB: 16-Feb-1996 Today's Date: 02/26/2015    History of Present Illness Erik [name] is a 19 y.o. male with history of IV drug use who presented with severe foot pain, bilateral hip pain, lower extremity weakness initially to [facility] ED, transfer to [facility] for MRI to rule out cord compression. Patient admitted to IV heroin use 2 days ago, NED, troponin 46, ST depressions in the lateral leads.    PT Comments    Pt much improved with mobility today. Able to transfer with mod I, perform gait with RW with supervision. Pt still limited by R LE pain. Pt will continue to benefit from skilled PT for balance and strengthening and stair negotiation.  Follow Up Recommendations  Outpatient PT     Equipment Recommendations       Recommendations for Other Services       Precautions / Restrictions Precautions Precautions: Fall Required Braces or Orthoses: Knee Immobilizer - Right Restrictions Weight Bearing Restrictions: No    Mobility  Bed Mobility Overal bed mobility: Independent                Transfers Overall transfer level: Modified independent Equipment used: Rolling walker (2 wheeled)   Sit to Stand: Modified independent (Device/Increase time) Stand pivot transfers: Modified independent (Device/Increase time)       General transfer comment: Pt able to perform sit to stand and transfer with RW without assistance  Ambulation/Gait Ambulation/Gait assistance: Modified independent (Device/Increase time) Ambulation Distance (Feet): 150 Feet Assistive device: Rolling walker (2 wheeled)       General Gait Details: Pt with increased wt bearing through UEs, pt states his R LE hurts worse than L LE with gait, no LOB during gait. Pt states he feels comfortable with RW   Stairs Stairs:  (discussed stair technique for home, pt declined to perform due to R LE pain)           Wheelchair Mobility    Modified Rankin (Stroke Patients Only)       Balance             Standing balance-Leahy Scale: Fair Standing balance comment: pt able to stand and take 5 steps without AD with supervision                    Cognition Arousal/Alertness: Awake/alert Behavior During Therapy: WFL for tasks assessed/performed Overall Cognitive Status: Within Functional Limits for tasks assessed                      Exercises      General Comments        Pertinent Vitals/Pain Faces Pain Scale: Hurts even more Pain Location: Rt LE with wt bearing Pain Descriptors / Indicators: Aching Pain Intervention(s): Limited activity within patient's tolerance;Monitored during session;Repositioned    Home Living                      Prior Function            PT Goals (current goals can now be found in the care plan section) Progress towards PT goals: Progressing toward goals;Goals met and updated - see care plan    Frequency  Min 3X/week    PT Plan Current plan remains appropriate    Co-evaluation             End of Session Equipment Utilized During Treatment: Gait belt  Activity Tolerance: Patient limited by pain Patient left: in bed;with call bell/phone within reach     Time: 0925-0940 PT Time Calculation (min) (ACUTE ONLY): 15 min  Charges:  $Gait Training: 8-22 mins                    G Codes:      Erik [name] 03-03-2015, 9:47 AM

## 2015-02-26 NOTE — Discharge Summary (Signed)
PATIENT DETAILS Name: Erik [name] Age: 19 y.o. Sex: male Date of Birth: [birth year] MRN: [id number]. Admitting Physician: Mendel Corning, MD [id number] Asa Lente, MD  Admit Date: 02/22/2015 Discharge date: 02/26/2015  Recommendations for Outpatient Follow-up:  1. Re-assess right foot at next visit. 2. Please follow blood cultures till final 3. Counsel against polysubstance use  PRIMARY DISCHARGE DIAGNOSIS:  Principal Problem:   Lower extremity pain, bilateral Active Problems:   IVDU (intravenous drug user)   Hyperkalemia   Elevated troponin   Leukocytosis   Acute renal failure due to rhabdomyolysis (HCC)   Rhabdomyolysis   NSTEMI (non-ST elevated myocardial infarction) ([place])   IV drug abuse   Cocaine abuse      PAST MEDICAL HISTORY: Past Medical History  Diagnosis Date  . Sinusitis   . Anxiety   . Hepatitis B   . Drug abuse and dependence ([place])     admit with OD 11/2014: rhabdo and AKI, ARF -     DISCHARGE MEDICATIONS: Current Discharge Medication List    START taking these medications   Details  aspirin EC 81 MG EC tablet Take 1 tablet (81 mg total) by mouth daily. Qty: 30 tablet, Refills: 0    carvedilol (COREG) 3.125 MG tablet Take 1 tablet (3.125 mg total) by mouth 2 (two) times daily with a meal. Qty: 60 tablet, Refills: 0    doxycycline (VIBRAMYCIN) 50 MG capsule Take 2 capsules (100 mg total) by mouth 2 (two) times daily. Qty: 10 capsule, Refills: 0    oxyCODONE (OXY IR/ROXICODONE) 5 MG immediate release tablet Take 1-2 tablets (5-10 mg total) by mouth every 6 (six) hours as needed for moderate pain. Qty: 30 tablet, Refills: 0        ALLERGIES:   Allergies  Allergen Reactions  . Penicillins Anaphylaxis    All "-cillins."   . Penicillins Swelling    Lips swell.    . Sulfa Antibiotics     Unknown.   . Sulfa Antibiotics Swelling    BRIEF HPI:  See H&P, Labs, Consult and Test reports for all details in brief, Erik [name] is a 19  y.o. male, with an admission in 11/2014 for narcotic induced respiratory failure, presented to [facility] ER with severe pain in both feet. He last used IV heroin 2 days prior to admission.In the emergency department this morning his troponin is 46.69 and he has significant ST depression in V4, V5, V6. As well as II, III and a new RBBB.  CONSULTATIONS:   cardiology  PERTINENT RADIOLOGIC STUDIES: Dg Chest 2 View  02/22/2015  CLINICAL DATA:  Shortness of Breath EXAM: CHEST - 2 VIEW COMPARISON:  None. FINDINGS: The heart size and mediastinal contours are within normal limits. Both lungs are clear. The visualized skeletal structures are unremarkable. IMPRESSION: No active disease. Electronically Signed   By: Inez Catalina M.D.   On: 02/22/2015 08:54   Mr Thoracic Spine W Wo Contrast  02/22/2015  CLINICAL DATA:  19 year old with recent hospitalization for narcotic induced respiratory failure. Patient awoke this morning with severe foot pain, bilateral leg weakness and inability to urinate. History of hepatitis B. EXAM: MRI THORACIC AND LUMBAR SPINE WITHOUT AND WITH CONTRAST TECHNIQUE: Multiplanar and multiecho pulse sequences of the thoracic and lumbar spine were obtained without and with intravenous contrast. CONTRAST:  59mL MULTIHANCE GADOBENATE DIMEGLUMINE 529 MG/ML IV SOLN COMPARISON:  CT of the chest, abdomen and pelvis 02/27/2014. Chest radiographs today. FINDINGS: MR THORACIC SPINE FINDINGS Alignment:  Normal. Bones: No  acute or suspicious osseous findings. There is no abnormal osseous or endplate enhancement. Cord: The thoracic cord appears normal in signal and caliber. No abnormal intradural enhancement. Paraspinal and other soft tissues: No significant paraspinal abnormalities. There is concern of patchy airspace disease at both lung bases, worse on the left. No significant pleural effusion. Disc levels: All of the thoracic discs appear normal. There is no evidence of disc herniation, spinal stenosis or  nerve root encroachment. There is no evidence of discitis. MR LUMBAR SPINE FINDINGS Segmentation: Conventional anatomy assumed, with the last open disc space designated L5-S1. Alignment:  Normal. Bones: No worrisome osseous lesion, acute fracture or pars defect. No evidence of discitis or osteomyelitis. The lumbar pedicles are somewhat short on a congenital basis. The sacroiliac joints appear normal. Conus medullaris: Extends to the L1 level and appears normal. There is no abnormal intradural enhancement. Paraspinal and other soft tissues: No significant paraspinal findings. On the lower axial images, there is asymmetric T2 hyperintensity throughout the right gluteus musculature. There is also asymmetric enhancement in this area on the post-contrast images. No focal fluid collection identified. Disc levels: All of the lumbar discs are well hydrated. There is no disc herniation, spinal stenosis or nerve root encroachment. No evidence of discitis or osteomyelitis. IMPRESSION: 1. Negative MRI of the thoracolumbar spine. No evidence of disc herniation, spinal stenosis, discitis or osteomyelitis. The thoracic cord and conus medullaris appear normal. 2. Concern of patchy airspace opacities at both lung bases, worrisome for early aspiration in this clinical context. Chest radiographic follow up recommended. 3. T2 hyperintensity and enhancement in the right gluteus musculature, incompletely visualized. In this clinical context, this could indicate rhabdomyolysis. Correlate clinically. Follow up CK levels recommended. Electronically Signed   By: Richardean Sale M.D.   On: 02/22/2015 11:01   Mr Lumbar Spine W Wo Contrast  02/22/2015  CLINICAL DATA:  19 year old with recent hospitalization for narcotic induced respiratory failure. Patient awoke this morning with severe foot pain, bilateral leg weakness and inability to urinate. History of hepatitis B. EXAM: MRI THORACIC AND LUMBAR SPINE WITHOUT AND WITH CONTRAST TECHNIQUE:  Multiplanar and multiecho pulse sequences of the thoracic and lumbar spine were obtained without and with intravenous contrast. CONTRAST:  27mL MULTIHANCE GADOBENATE DIMEGLUMINE 529 MG/ML IV SOLN COMPARISON:  CT of the chest, abdomen and pelvis 02/27/2014. Chest radiographs today. FINDINGS: MR THORACIC SPINE FINDINGS Alignment:  Normal. Bones: No acute or suspicious osseous findings. There is no abnormal osseous or endplate enhancement. Cord: The thoracic cord appears normal in signal and caliber. No abnormal intradural enhancement. Paraspinal and other soft tissues: No significant paraspinal abnormalities. There is concern of patchy airspace disease at both lung bases, worse on the left. No significant pleural effusion. Disc levels: All of the thoracic discs appear normal. There is no evidence of disc herniation, spinal stenosis or nerve root encroachment. There is no evidence of discitis. MR LUMBAR SPINE FINDINGS Segmentation: Conventional anatomy assumed, with the last open disc space designated L5-S1. Alignment:  Normal. Bones: No worrisome osseous lesion, acute fracture or pars defect. No evidence of discitis or osteomyelitis. The lumbar pedicles are somewhat short on a congenital basis. The sacroiliac joints appear normal. Conus medullaris: Extends to the L1 level and appears normal. There is no abnormal intradural enhancement. Paraspinal and other soft tissues: No significant paraspinal findings. On the lower axial images, there is asymmetric T2 hyperintensity throughout the right gluteus musculature. There is also asymmetric enhancement in this area on the post-contrast images. No focal  fluid collection identified. Disc levels: All of the lumbar discs are well hydrated. There is no disc herniation, spinal stenosis or nerve root encroachment. No evidence of discitis or osteomyelitis. IMPRESSION: 1. Negative MRI of the thoracolumbar spine. No evidence of disc herniation, spinal stenosis, discitis or  osteomyelitis. The thoracic cord and conus medullaris appear normal. 2. Concern of patchy airspace opacities at both lung bases, worrisome for early aspiration in this clinical context. Chest radiographic follow up recommended. 3. T2 hyperintensity and enhancement in the right gluteus musculature, incompletely visualized. In this clinical context, this could indicate rhabdomyolysis. Correlate clinically. Follow up CK levels recommended. Electronically Signed   By: Richardean Sale M.D.   On: 02/22/2015 11:01   Dg Foot Complete Left  02/22/2015  CLINICAL DATA:  Bilateral foot pain EXAM: LEFT FOOT - COMPLETE 3+ VIEW COMPARISON:  None. FINDINGS: There is no evidence of fracture or dislocation. There is no evidence of arthropathy or other focal bone abnormality. Soft tissues are unremarkable. IMPRESSION: No acute abnormality noted. Electronically Signed   By: Inez Catalina M.D.   On: 02/22/2015 08:55   Dg Foot Complete Right  02/22/2015  CLINICAL DATA:  Bilateral foot pain EXAM: RIGHT FOOT COMPLETE - 3+ VIEW COMPARISON:  None. FINDINGS: There is no evidence of fracture or dislocation. There is no evidence of arthropathy or other focal bone abnormality. Soft tissues are unremarkable. IMPRESSION: No acute abnormality noted. Electronically Signed   By: Inez Catalina M.D.   On: 02/22/2015 08:55     PERTINENT LAB RESULTS: CBC:  Recent Labs  02/24/15 0451 02/25/15 0605  WBC 7.8 7.4  HGB 13.9 14.8  HCT 41.4 42.7  PLT 204 223   CMET CMP     Component Value Date/Time   NA 137 02/26/2015 0530   NA 139 02/27/2014 0351   K 3.6 02/26/2015 0530   K 3.6 02/27/2014 0351   CL 107 02/26/2015 0530   CL 106 02/27/2014 0351   CO2 23 02/26/2015 0530   CO2 26 02/27/2014 0351   GLUCOSE 103* 02/26/2015 0530   GLUCOSE 86 02/27/2014 0351   BUN 11 02/26/2015 0530   BUN 16 02/27/2014 0351   CREATININE 0.99 02/26/2015 0530   CREATININE 0.94 02/27/2014 0351   CALCIUM 9.0 02/26/2015 0530   CALCIUM 8.8 02/27/2014  0351   PROT 5.5* 02/26/2015 0530   PROT 7.8 02/27/2014 0351   ALBUMIN 2.7* 02/26/2015 0530   ALBUMIN 4.0 02/27/2014 0351   AST 248* 02/26/2015 0530   AST 30 02/27/2014 0351   ALT 177* 02/26/2015 0530   ALT 24 02/27/2014 0351   ALKPHOS 49 02/26/2015 0530   ALKPHOS 75 02/27/2014 0351   BILITOT 0.5 02/26/2015 0530   BILITOT 0.6 02/27/2014 0351   GFRNONAA >60 02/26/2015 0530   GFRNONAA >60 02/27/2014 0351   GFRAA >60 02/26/2015 0530   GFRAA >60 02/27/2014 0351    GFR Estimated Creatinine Clearance: 100.3 mL/min (by C-G formula based on Cr of 0.99). No results for input(s): LIPASE, AMYLASE in the last 72 hours.  Recent Labs  02/24/15 0451 02/25/15 0605 02/26/15 0530  CKTOTAL 9490* 5921* 3518*   Invalid input(s): POCBNP No results for input(s): DDIMER in the last 72 hours. No results for input(s): HGBA1C in the last 72 hours. No results for input(s): CHOL, HDL, LDLCALC, TRIG, CHOLHDL, LDLDIRECT in the last 72 hours. No results for input(s): TSH, T4TOTAL, T3FREE, THYROIDAB in the last 72 hours.  Invalid input(s): FREET3 No results for input(s): VITAMINB12, FOLATE, FERRITIN,  TIBC, IRON, RETICCTPCT in the last 72 hours. Coags: No results for input(s): INR in the last 72 hours.  Invalid input(s): PT Microbiology: Recent Results (from the past 240 hour(s))  Blood culture (routine x 2)     Status: None (Preliminary result)   Collection Time: 02/22/15  5:10 AM  Result Value Ref Range Status   Specimen Description BLOOD RIGHT FOREARM  Final   Special Requests BOTTLES DRAWN AEROBIC AND ANAEROBIC 5ML  Final   Culture   Final    NO GROWTH 3 DAYS Performed at [facility]    Report Status PENDING  Incomplete  Blood culture (routine x 2)     Status: None (Preliminary result)   Collection Time: 02/22/15  5:20 AM  Result Value Ref Range Status   Specimen Description BLOOD LEFT HAND  Final   Special Requests IN PEDIATRIC BOTTLE 2ML  Final   Culture   Final    NO GROWTH 3  DAYS Performed at [facility]    Report Status PENDING  Incomplete  Culture, Urine     Status: None   Collection Time: 02/22/15  7:19 AM  Result Value Ref Range Status   Specimen Description URINE, RANDOM  Final   Special Requests CX ADDED AT 0309 ON [id number]  Final   Culture NO GROWTH 1 DAY  Final   Report Status 02/24/2015 FINAL  Final  Culture, blood (single)     Status: None (Preliminary result)   Collection Time: 02/22/15  7:46 AM  Result Value Ref Range Status   Specimen Description BLOOD RIGHT HAND  Final   Special Requests BOTTLES DRAWN AEROBIC AND ANAEROBIC 5CC  Final   Culture NO GROWTH 3 DAYS  Final   Report Status PENDING  Incomplete  MRSA PCR Screening     Status: Abnormal   Collection Time: 02/22/15  5:05 PM  Result Value Ref Range Status   MRSA by PCR POSITIVE (A) NEGATIVE Final    Comment:        The GeneXpert MRSA Assay (FDA approved for NASAL specimens only), is one component of a comprehensive MRSA colonization surveillance program. It is not intended to diagnose MRSA infection nor to guide or monitor treatment for MRSA infections. RESULT CALLED TO, READ BACK BY AND VERIFIED WITH: J.PERKINS,RN 02/22/15 @2010  BY V.WILKINS      BRIEF HOSPITAL COURSE:  NSTEMI: Secondary to cocaine-induced vasospasm. Underwent LHC on 12/17 which showed normal coronary arteries. Continue aspirin, Coreg.No further recommendations from cardiology.  Rhabdomyolysis: Suspect that this is nontraumatic and nonexertional-likely from cocaine/heroin use.Managed with IVF, CK down trending well-down to 3518 from a peak of 91478 on admission. Advised patient to keep himself well hydrated.   Acute urinary retention: Suspect from pain, no major abnormality seen on MRI spine. Foley catheter inserted on admission, voiding trial successful on 12/19-voiding spontaneously  ARF: likely secondary to rhabdomyolysis/acute urinary retention. Creatinine normalized with IV fluids.    Cellulitis Right Foot plantar surface/Left foot mid dorsum: right foot much improved, left foot stable-still with erythema and mild swelling in the right plantar surface-although slightly better than on 12/20-patient wanting to go home today-Xray on admit-did not show any bony abnormality. I am not sure whether he actually "bumped" his feet onto something-per patient he does not remember things clearly. Irrespective-this appears to be a superficial issue and doubt any underlying deep tissue infection-and since getting slowly better we suspect we can transition him to oral Doxycycline (on IV Vancomycin while inpatient) and watch him  closely.Encouraged him to seek medical attention or get in touch with PCP if it worsens further.  Asked him to apply compresses as well.Father at bedside-agreeable with this plan of care.  Leukocytosis: MRI thoracic or lumbar spine negative for discitis/abscess. TEE negative for endocarditis.Blood cultures negative so far. Leukocytosis has resolved-could have been reactive to rhabdomyolysis or from cellulitis of right foot.Initially managed with IV Vanco/Azactam-once blood cultures were negative-Abx were tapered down to just Vancomycin. Will be transitioned to just Doxycycline on discharge.  Hyperkalemia: Resolved, likely secondary to rhabdomyolysis/renal failure   Polysubstance abuse/IVDA: Counseled extensively. Understands life threatening and life disabling effects of IVDA/Polysubstance abuse. Refused psychiatric evaluation while inpatient.  Seen by PT-recommendations were for Outpatient PT-which patient politely refuses.   TODAY-DAY OF DISCHARGE:  Subjective:   Erik [name] today has no headache,no chest abdominal pain,no new weakness tingling or numbness, feels much better wants to go home today.  Objective:   Blood pressure 126/72, pulse 53, temperature 98.5 F (36.9 C), temperature source Oral, resp. rate 18, height 5\' 6"  (1.676 m), weight 60.1 kg (132 lb  7.9 oz), SpO2 97 %.  Intake/Output Summary (Last 24 hours) at 02/26/15 1026 Last data filed at 02/26/15 0700  Gross per 24 hour  Intake 3656.33 ml  Output   2750 ml  Net 906.33 ml   Filed Weights   02/24/15 1849 02/25/15 0524 02/26/15 0508  Weight: 60.646 kg (133 lb 11.2 oz) 61.78 kg (136 lb 3.2 oz) 60.1 kg (132 lb 7.9 oz)    Exam Awake Alert, Oriented *3, No new F.N deficits, Normal affect [place].AT,PERRAL Supple Neck,No JVD, No cervical lymphadenopathy appriciated.  Symmetrical Chest wall movement, Good air movement bilaterally, CTAB RRR,No Gallops,Rubs or new Murmurs, No Parasternal Heave +ve B.Sounds, Abd Soft, Non tender, No organomegaly appriciated, No rebound -guarding or rigidity. No Cyanosis, Clubbing or edema, No new Rash or bruise Mild erythema/swelling present in the mid foot-plantar surface  DISCHARGE CONDITION: Stable  DISPOSITION: Home  DISCHARGE INSTRUCTIONS:    Activity:  As tolerated  Get Medicines reviewed and adjusted: Please take all your medications with you for your next visit with your Primary MD  Please request your Primary MD to go over all hospital tests and procedure/radiological results at the follow up, please ask your Primary MD to get all Hospital records sent to his/her office.  If you experience worsening of your admission symptoms, develop shortness of breath, life threatening emergency, suicidal or homicidal thoughts you must seek medical attention immediately by calling 911 or calling your MD immediately  if symptoms less severe.  You must read complete instructions/literature along with all the possible adverse reactions/side effects for all the Medicines you take and that have been prescribed to you. Take any new Medicines after you have completely understood and accpet all the possible adverse reactions/side effects.   Do not drive when taking Pain medications.   Do not take more than prescribed Pain, Sleep and Anxiety  Medications  Special Instructions: If you have smoked or chewed Tobacco  in the last 2 yrs please stop smoking, stop any regular Alcohol  and or any Recreational drug use.  Wear Seat belts while driving.  Please note  You were cared for by a hospitalist during your hospital stay. Once you are discharged, your primary care physician will handle any further medical issues. Please note that NO REFILLS for any discharge medications will be authorized once you are discharged, as it is imperative that you return to your primary care physician (  or establish a relationship with a primary care physician if you do not have one) for your aftercare needs so that they can reassess your need for medications and monitor your lab values.   Diet recommendation: Heart Healthy diet  Discharge Instructions    Call MD for:  redness, tenderness, or signs of infection (pain, swelling, redness, odor or green/yellow discharge around incision site)    Complete by:  As directed      Call MD for:  severe uncontrolled pain    Complete by:  As directed      Diet - low sodium heart healthy    Complete by:  As directed      Increase activity slowly    Complete by:  As directed            Follow-up Information    Follow up with Gwendolyn Grant, MD. Schedule an appointment as soon as possible for a visit in 5 days.   Specialty:  Internal Medicine   Why:  Hospital follow up   Contact information:   [address] [place] [address] [phone]      Total Time spent on discharge equals 45 minutes.  SignedOren Binet 02/26/2015 10:26 AM

## 2015-02-26 NOTE — Consult Note (Signed)
   [facility] CM Inpatient Consult   02/26/2015  Erik [name] [birth year] [id number]   Came to visit patient at bedside to discuss Link to Wellness program for [employer] employees/dependents with [facility] insurance. Patient denies having any needs for Link to [facility] Care Management program. Declined post hospital follow up. Link to [employer] and contact information given to patient. Appreciative of visit.  Marthenia Rolling, MSN-Ed, RN,BSN [facility] Liaison [phone]

## 2015-02-27 LAB — CULTURE, BLOOD (SINGLE): Culture: NO GROWTH

## 2015-02-27 LAB — CULTURE, BLOOD (ROUTINE X 2)
CULTURE: NO GROWTH
Culture: NO GROWTH

## 2015-03-05 ENCOUNTER — Ambulatory Visit (INDEPENDENT_AMBULATORY_CARE_PROVIDER_SITE_OTHER): Payer: 59 | Admitting: Family

## 2015-03-05 ENCOUNTER — Other Ambulatory Visit (INDEPENDENT_AMBULATORY_CARE_PROVIDER_SITE_OTHER): Payer: 59

## 2015-03-05 ENCOUNTER — Telehealth: Payer: Self-pay | Admitting: Family

## 2015-03-05 ENCOUNTER — Encounter: Payer: Self-pay | Admitting: Family

## 2015-03-05 VITALS — BP 122/86 | HR 64 | Temp 97.9°F | Resp 18 | Ht 65.0 in | Wt 134.4 lb

## 2015-03-05 DIAGNOSIS — B181 Chronic viral hepatitis B without delta-agent: Secondary | ICD-10-CM

## 2015-03-05 DIAGNOSIS — T796XXD Traumatic ischemia of muscle, subsequent encounter: Secondary | ICD-10-CM

## 2015-03-05 DIAGNOSIS — N179 Acute kidney failure, unspecified: Secondary | ICD-10-CM

## 2015-03-05 LAB — BASIC METABOLIC PANEL
BUN: 15 mg/dL (ref 6–23)
CO2: 30 meq/L (ref 19–32)
Calcium: 9.7 mg/dL (ref 8.4–10.5)
Chloride: 100 mEq/L (ref 96–112)
Creatinine, Ser: 0.87 mg/dL (ref 0.40–1.50)
GFR: 117.31 mL/min (ref >60.00)
GLUCOSE: 91 mg/dL (ref 70–99)
POTASSIUM: 4.4 meq/L (ref 3.5–5.1)
Sodium: 137 mEq/L (ref 135–145)

## 2015-03-05 LAB — HEPATIC FUNCTION PANEL
ALBUMIN: 4.3 g/dL (ref 3.5–5.2)
ALK PHOS: 58 U/L (ref 39–117)
ALT: 55 U/L — ABNORMAL HIGH (ref 0–53)
AST: 25 U/L (ref 0–37)
Bilirubin, Direct: 0 mg/dL (ref 0.0–0.3)
TOTAL PROTEIN: 7.9 g/dL (ref 6.0–8.3)
Total Bilirubin: 0.4 mg/dL (ref 0.2–1.2)

## 2015-03-05 LAB — CK: Total CK: 148 U/L (ref 7–232)

## 2015-03-05 NOTE — Progress Notes (Signed)
Subjective:    Patient ID: Erik [name], male    DOB: [birth year], 19 y.o.   MRN: [id number]  Chief Complaint  Patient presents with  . Hospitalization Follow-up    wants to talk about the healing process in his hip    HPI:  Erik [name] is a 19 y.o. male who  has a past medical history of Sinusitis; Anxiety; Hepatitis B; and Drug abuse and dependence ([place]). and presents today for an office follow-up after hospitalization.  Recently evaluated in the emergency room and admitted to the hospital with leg pain and numbness. Describes that he was not able to walk. Did admit to active drug use 2 days prior to evaluation including cocaine and heroin. He was unable to get into the bed with his own strength per nurse that his records indicating admission for septic shock with rhabdomyolysis, heart failure, and renal failure. Underwent a cardiac catheterization and was noted to have normal coronary arteries with an NSTEMI secondary to cocaine-induced vasospasm. Initially unable to void which was improved upon discharge. Rhabdomyolysis most likely drug-induced with creatinine kinase trending well down instructions to keep himself hydrated. Questionable cellulitis of right foot and left midfoot, treated with doxycycline. MRI was negative for discitis or abscess. TEE was negative for endocarditis. All hospital records, labs and images were reviewed in detail.   Since leaving the hospital the swelling has decreased in his foot, but continues to experience some right swelling. Pain is described as an achy pain with pressure and occasionally sharp.  Does have some bruising throughout the right leg which he reports is improving.  Has been moving around in attempts to help it improve.his symptoms. Reports he has been sober since initially being seen. He is able to complete his activities of daily living and working on returning to baseline function.   Allergies  Allergen Reactions  . Penicillins Anaphylaxis     All "-cillins."   . Penicillins Swelling    Lips swell.    . Sulfa Antibiotics     Unknown.   . Sulfa Antibiotics Swelling     Current Outpatient Prescriptions on File Prior to Visit  Medication Sig Dispense Refill  . aspirin EC 81 MG EC tablet Take 1 tablet (81 mg total) by mouth daily. 30 tablet 0  . carvedilol (COREG) 3.125 MG tablet Take 1 tablet (3.125 mg total) by mouth 2 (two) times daily with a meal. 60 tablet 0  . oxyCODONE (OXY IR/ROXICODONE) 5 MG immediate release tablet Take 1-2 tablets (5-10 mg total) by mouth every 6 (six) hours as needed for moderate pain. 30 tablet 0   No current facility-administered medications on file prior to visit.     Past Surgical History  Procedure Laterality Date  . No past surgeries    . Cardiac catheterization N/A 02/22/2015    Procedure: Left Heart Cath and Coronary Angiography;  Surgeon: Troy Sine, MD;  Location: [facility];  Service: Cardiovascular;  Laterality: N/A;  . Tee without cardioversion N/A 02/24/2015    Procedure: TRANSESOPHAGEAL ECHOCARDIOGRAM (TEE);  Surgeon: Skeet Latch, MD;  Location: [facility] END[facility];  Service: Cardiovascular;  Laterality: N/A;    Review of Systems  Constitutional: Negative for fever and chills.  Respiratory: Negative for cough, chest tightness and shortness of breath.   Cardiovascular: Negative for chest pain, palpitations and leg swelling.  Genitourinary: Negative for urgency, frequency, hematuria, flank pain and difficulty urinating.  Musculoskeletal: Negative for myalgias.      Objective:  BP 122/86 mmHg  Pulse 64  Temp(Src) 97.9 F (36.6 C) (Oral)  Resp 18  Ht 5\' 5"  (1.651 m)  Wt 134 lb 6.4 oz (60.963 kg)  BMI 22.37 kg/m2  SpO2 97% Nursing note and vital signs reviewed.  Physical Exam  Constitutional: He is oriented to person, place, and time. He appears well-developed and well-nourished. No distress.  Cardiovascular: Normal rate, regular rhythm, normal heart  sounds and intact distal pulses.   Pulmonary/Chest: Effort normal and breath sounds normal.  Musculoskeletal:  Right pelvis/hip - no obvious deformity, discoloration, or edema noted. Mild tenderness elicited over lateral aspect of pelvis and hip. Range of motion is normal compared to the contralateral side. Strength is 4+ with a decrease compared to the contralateral side. Distal pulses, sensation, and reflexes are intact and appropriate.  Neurological: He is alert and oriented to person, place, and time.  Skin: Skin is warm and dry.  Psychiatric: He has a normal mood and affect. His behavior is normal. Judgment and thought content normal.       Assessment & Plan:   Problem List Items Addressed This Visit      Digestive   Viral hepatitis B chronic ([place])    Liver function tests elevated previous blood work. Obtain liver function panel to determine current status and need for further assessment. Follow-up pending blood work is necessary.        Musculoskeletal and Integument   Rhabdomyolysis - Primary    Rhabdomyolysis appears improving although some mild pain remains. Appears to be nontraumatic and brought on by polysubstance abuse including cocaine. Discussed importance of remaining sober to allow for muscular healing. Continue to drink plenty of fluids. Obtain creatine kinase to check current muscle breakdown. Follow-up if symptoms worsen or do not improve over time. Patient is high risk for relapse.      Relevant Orders   CK (Creatine Kinase) (Completed)   Basic Metabolic Panel (BMET) (Completed)   Hepatic function panel (Completed)     Genitourinary   Acute kidney injury ([place])    Acute kidney injury appears resolved with normal urination and no changes to urine color. Blood work on discharge shows normal BUN/creatinine creatinine. Obtain basic metabolic panel to check current status. Follow-up if symptoms return or pending blood work as needed.

## 2015-03-05 NOTE — Telephone Encounter (Signed)
Please inform patient that his liver function, creatine kinase, and kidney function are all within the normal limits and he she continues to experience improvement as we discussed. Follow-up for any worsening of symptoms.

## 2015-03-05 NOTE — Assessment & Plan Note (Signed)
Rhabdomyolysis appears improving although some mild pain remains. Appears to be nontraumatic and brought on by polysubstance abuse including cocaine. Discussed importance of remaining sober to allow for muscular healing. Continue to drink plenty of fluids. Obtain creatine kinase to check current muscle breakdown. Follow-up if symptoms worsen or do not improve over time. Patient is high risk for relapse.

## 2015-03-05 NOTE — Assessment & Plan Note (Signed)
Liver function tests elevated previous blood work. Obtain liver function panel to determine current status and need for further assessment. Follow-up pending blood work is necessary.

## 2015-03-05 NOTE — Assessment & Plan Note (Signed)
Acute kidney injury appears resolved with normal urination and no changes to urine color. Blood work on discharge shows normal BUN/creatinine creatinine. Obtain basic metabolic panel to check current status. Follow-up if symptoms return or pending blood work as needed.

## 2015-03-05 NOTE — Patient Instructions (Addendum)
Thank you for choosing [facility].  Summary/Instructions:  Please stop by the lab on the basement level of the building for your blood work. Your results will be released to [facility] (or called to you) after review, usually within 72 hours after test completion. If any changes need to be made, you will be notified at that same time.  If your symptoms worsen or fail to improve, please contact our office for further instruction, or in case of emergency go directly to the emergency room at the closest medical facility.    Rhabdomyolysis Rhabdomyolysis is a condition that results when muscle cells break down and release substances into the blood that can damage the kidneys. It happens because of damage to the muscles that move bones (skeletal muscle). When you damage this type of muscle, substances inside of your muscle cells are released into your blood. This includes a certain protein called myoglobin. Your kidneys must filter myoglobin from your blood. Large amounts of myoglobin can cause kidney damage or kidney failure. Other substances that are released by muscle cells may upset the balance of the minerals (electrolytes) in your blood. This makes your blood become too acidic (acidosis). CAUSES This condition is caused by muscle damage. Muscle damage often results from:  Extreme overuse of the muscles.  An injury that crushes or compresses a muscle.  Use of illegal drugs, especially cocaine.  Alcohol abuse. Other possible causes include:  Prescription medicines, such as statins, amphetamines, and opiates.  Infections.  Inherited muscle diseases.  High fever.  Heatstroke.  Dehydration.  Seizures.  Surgery. RISK FACTORS This condition is more likely to develop in:  People who have a family history of muscle disease.  People who participate in extreme sports, such as marathon running.  People who have diabetes.  Older people.  People who abuse drugs or  alcohol. SYMPTOMS Symptoms of this condition vary. Some people have very few symptoms, while others have many symptoms. The most common symptoms include:  Muscle pain and swelling.  Muscle weakness.  Dark urine.  Feeling weak and tired. Other symptoms include:  Nausea and vomiting.  Fever.  Pain in the abdomen.  Joint pain. Signs and symptoms of complications from rhabdomyolysis may include:  Heart rhythm abnormalities (arrhythmias).  Seizures.  Reduced urine production because of kidney failure.  Very low blood pressure (shock).  Uncontrolled bleeding. DIAGNOSIS This condition may be diagnosed based on:  Your symptoms and medical history.  A physical exam.  Blood tests to check for:  Muscle breakdown products in the blood (creatine kinase).  Myoglobin.  Acidosis.  Electrolyte imbalances.  Urine tests to check for myoglobin. You may also have other tests to check for causes of muscle damage and to check for complications. TREATMENT Treatment for this condition focuses on keeping up your fluid level, reversing acidosis, and protecting your kidneys. Treatment may include:  Fluids and medicines given through an IV tube that is inserted into one of your veins.  Medicines, such as:  Sodium bicarbonate to reduce acidosis.  Electrolytes to restore the balance of these minerals in your body.  Hemodialysis. This treatment uses an artificial kidney machine to filter your blood while you recover. You may have this if other treatments are not helping. HOME CARE INSTRUCTIONS  Take medicines only as directed by your health care provider.  Rest at home until your health care provider says that you can return to your normal activities.  Drink enough fluid to keep your urine clear or pale yellow.  Do not exercise with great energy and effort (strenuously). Ask your health care provider what level of exercise is safe for you.  Do not abuse drugs or alcohol. If  you are struggling with drug or alcohol use, ask your health care provider for help.  Keep all follow-up visits as directed by your health care provider. This is important. SEEK MEDICAL CARE IF:  You develop symptoms of rhabdomyolysis at home after treatment. SEEK IMMEDIATE MEDICAL CARE IF:  You have a seizure.  You bleed easily or cannot control bleeding.  You cannot make urine.  You have chest pain.  You have trouble breathing.   This information is not intended to replace advice given to you by your health care provider. Make sure you discuss any questions you have with your health care provider.   Document Released: 02/05/2004 Document Revised: 07/09/2014 Document Reviewed: 02/27/2014 Elsevier Interactive Patient Education [employer].

## 2015-03-05 NOTE — Progress Notes (Signed)
Pre visit review using our clinic review tool, if applicable. No additional management support is needed unless otherwise documented below in the visit note. 

## 2015-03-11 NOTE — Telephone Encounter (Signed)
Pts number is not correct. Going to send results in the mail.

## 2015-04-11 DIAGNOSIS — H10011 Acute follicular conjunctivitis, right eye: Secondary | ICD-10-CM | POA: Diagnosis not present

## 2015-05-19 DIAGNOSIS — F1120 Opioid dependence, uncomplicated: Secondary | ICD-10-CM | POA: Diagnosis not present

## 2015-05-28 DIAGNOSIS — F1120 Opioid dependence, uncomplicated: Secondary | ICD-10-CM | POA: Diagnosis not present

## 2015-06-03 DIAGNOSIS — F1110 Opioid abuse, uncomplicated: Secondary | ICD-10-CM | POA: Diagnosis not present

## 2015-06-03 DIAGNOSIS — F329 Major depressive disorder, single episode, unspecified: Secondary | ICD-10-CM | POA: Diagnosis not present

## 2015-06-07 DIAGNOSIS — F1120 Opioid dependence, uncomplicated: Secondary | ICD-10-CM | POA: Diagnosis not present

## 2015-06-13 ENCOUNTER — Ambulatory Visit (HOSPITAL_COMMUNITY): Payer: Self-pay | Admitting: Psychiatry

## 2015-07-03 DIAGNOSIS — F1120 Opioid dependence, uncomplicated: Secondary | ICD-10-CM | POA: Diagnosis not present

## 2015-07-14 DIAGNOSIS — F1120 Opioid dependence, uncomplicated: Secondary | ICD-10-CM | POA: Diagnosis not present

## 2015-07-16 DIAGNOSIS — F3181 Bipolar II disorder: Secondary | ICD-10-CM | POA: Diagnosis not present

## 2015-07-16 DIAGNOSIS — F329 Major depressive disorder, single episode, unspecified: Secondary | ICD-10-CM | POA: Diagnosis not present

## 2015-07-25 DIAGNOSIS — F1120 Opioid dependence, uncomplicated: Secondary | ICD-10-CM | POA: Diagnosis not present

## 2015-09-16 DIAGNOSIS — F1120 Opioid dependence, uncomplicated: Secondary | ICD-10-CM | POA: Diagnosis not present

## 2015-09-17 DIAGNOSIS — F1120 Opioid dependence, uncomplicated: Secondary | ICD-10-CM | POA: Diagnosis not present

## 2015-09-18 DIAGNOSIS — F1120 Opioid dependence, uncomplicated: Secondary | ICD-10-CM | POA: Diagnosis not present

## 2015-09-19 DIAGNOSIS — F1120 Opioid dependence, uncomplicated: Secondary | ICD-10-CM | POA: Diagnosis not present

## 2015-09-20 DIAGNOSIS — F1120 Opioid dependence, uncomplicated: Secondary | ICD-10-CM | POA: Diagnosis not present

## 2015-09-21 DIAGNOSIS — F1120 Opioid dependence, uncomplicated: Secondary | ICD-10-CM | POA: Diagnosis not present

## 2015-09-22 DIAGNOSIS — F1120 Opioid dependence, uncomplicated: Secondary | ICD-10-CM | POA: Diagnosis not present

## 2015-09-23 DIAGNOSIS — F1120 Opioid dependence, uncomplicated: Secondary | ICD-10-CM | POA: Diagnosis not present

## 2015-09-24 DIAGNOSIS — F1120 Opioid dependence, uncomplicated: Secondary | ICD-10-CM | POA: Diagnosis not present

## 2015-09-25 DIAGNOSIS — F1120 Opioid dependence, uncomplicated: Secondary | ICD-10-CM | POA: Diagnosis not present

## 2015-09-26 DIAGNOSIS — F1120 Opioid dependence, uncomplicated: Secondary | ICD-10-CM | POA: Diagnosis not present

## 2015-09-26 MED FILL — clonazePAM 1 MG TABS: 1 | 30 days supply | Qty: 30 | Fill #0

## 2015-10-03 DIAGNOSIS — F1120 Opioid dependence, uncomplicated: Secondary | ICD-10-CM | POA: Diagnosis not present

## 2015-10-06 DIAGNOSIS — F1120 Opioid dependence, uncomplicated: Secondary | ICD-10-CM | POA: Diagnosis not present

## 2015-10-08 DIAGNOSIS — F1120 Opioid dependence, uncomplicated: Secondary | ICD-10-CM | POA: Diagnosis not present

## 2015-10-10 DIAGNOSIS — F1120 Opioid dependence, uncomplicated: Secondary | ICD-10-CM | POA: Diagnosis not present

## 2015-10-13 DIAGNOSIS — F1120 Opioid dependence, uncomplicated: Secondary | ICD-10-CM | POA: Diagnosis not present

## 2015-10-15 DIAGNOSIS — F1120 Opioid dependence, uncomplicated: Secondary | ICD-10-CM | POA: Diagnosis not present

## 2015-10-16 DIAGNOSIS — F1120 Opioid dependence, uncomplicated: Secondary | ICD-10-CM | POA: Diagnosis not present

## 2015-10-17 DIAGNOSIS — F1120 Opioid dependence, uncomplicated: Secondary | ICD-10-CM | POA: Diagnosis not present

## 2015-10-20 DIAGNOSIS — F1120 Opioid dependence, uncomplicated: Secondary | ICD-10-CM | POA: Diagnosis not present

## 2015-10-22 DIAGNOSIS — F1120 Opioid dependence, uncomplicated: Secondary | ICD-10-CM | POA: Diagnosis not present

## 2015-10-31 ENCOUNTER — Encounter (HOSPITAL_COMMUNITY): Payer: Self-pay | Admitting: Emergency Medicine

## 2015-10-31 ENCOUNTER — Emergency Department (HOSPITAL_COMMUNITY): Payer: 59

## 2015-10-31 ENCOUNTER — Emergency Department (HOSPITAL_COMMUNITY)
Admission: EM | Admit: 2015-10-31 | Discharge: 2015-10-31 | Disposition: A | Discharge status: Home / Self Care | Payer: 59 | Attending: Emergency Medicine | Admitting: Emergency Medicine

## 2015-10-31 DIAGNOSIS — Y92410 Unspecified street and highway as the place of occurrence of the external cause: Secondary | ICD-10-CM | POA: Insufficient documentation

## 2015-10-31 DIAGNOSIS — S199XXA Unspecified injury of neck, initial encounter: Secondary | ICD-10-CM | POA: Diagnosis not present

## 2015-10-31 DIAGNOSIS — R930 Abnormal findings on diagnostic imaging of skull and head, not elsewhere classified: Secondary | ICD-10-CM | POA: Diagnosis not present

## 2015-10-31 DIAGNOSIS — S4992XA Unspecified injury of left shoulder and upper arm, initial encounter: Secondary | ICD-10-CM | POA: Diagnosis not present

## 2015-10-31 DIAGNOSIS — S3992XA Unspecified injury of lower back, initial encounter: Secondary | ICD-10-CM | POA: Diagnosis present

## 2015-10-31 DIAGNOSIS — F17210 Nicotine dependence, cigarettes, uncomplicated: Secondary | ICD-10-CM | POA: Insufficient documentation

## 2015-10-31 DIAGNOSIS — S0990XA Unspecified injury of head, initial encounter: Secondary | ICD-10-CM | POA: Diagnosis not present

## 2015-10-31 DIAGNOSIS — S299XXA Unspecified injury of thorax, initial encounter: Secondary | ICD-10-CM | POA: Diagnosis not present

## 2015-10-31 DIAGNOSIS — S6991XA Unspecified injury of right wrist, hand and finger(s), initial encounter: Secondary | ICD-10-CM | POA: Diagnosis not present

## 2015-10-31 DIAGNOSIS — Y999 Unspecified external cause status: Secondary | ICD-10-CM | POA: Diagnosis not present

## 2015-10-31 DIAGNOSIS — S59912A Unspecified injury of left forearm, initial encounter: Secondary | ICD-10-CM | POA: Diagnosis not present

## 2015-10-31 DIAGNOSIS — R791 Abnormal coagulation profile: Secondary | ICD-10-CM | POA: Insufficient documentation

## 2015-10-31 DIAGNOSIS — T148 Other injury of unspecified body region: Secondary | ICD-10-CM | POA: Diagnosis not present

## 2015-10-31 DIAGNOSIS — Y939 Activity, unspecified: Secondary | ICD-10-CM | POA: Diagnosis not present

## 2015-10-31 DIAGNOSIS — S3991XA Unspecified injury of abdomen, initial encounter: Secondary | ICD-10-CM | POA: Diagnosis not present

## 2015-10-31 DIAGNOSIS — R938 Abnormal findings on diagnostic imaging of other specified body structures: Secondary | ICD-10-CM | POA: Insufficient documentation

## 2015-10-31 DIAGNOSIS — F1120 Opioid dependence, uncomplicated: Secondary | ICD-10-CM | POA: Diagnosis not present

## 2015-10-31 DIAGNOSIS — S30810A Abrasion of lower back and pelvis, initial encounter: Secondary | ICD-10-CM | POA: Diagnosis not present

## 2015-10-31 DIAGNOSIS — T07 Unspecified multiple injuries: Secondary | ICD-10-CM | POA: Diagnosis not present

## 2015-10-31 DIAGNOSIS — M791 Myalgia: Secondary | ICD-10-CM | POA: Diagnosis not present

## 2015-10-31 DIAGNOSIS — M549 Dorsalgia, unspecified: Secondary | ICD-10-CM | POA: Diagnosis not present

## 2015-10-31 DIAGNOSIS — S6992XA Unspecified injury of left wrist, hand and finger(s), initial encounter: Secondary | ICD-10-CM | POA: Diagnosis not present

## 2015-10-31 DIAGNOSIS — M7989 Other specified soft tissue disorders: Secondary | ICD-10-CM | POA: Diagnosis not present

## 2015-10-31 DIAGNOSIS — S3993XA Unspecified injury of pelvis, initial encounter: Secondary | ICD-10-CM | POA: Diagnosis not present

## 2015-10-31 HISTORY — DX: Other psychoactive substance use, unspecified, uncomplicated: F19.90

## 2015-10-31 LAB — I-STAT CHEM 8, ED
BUN: 15 mg/dL (ref 6–20)
CALCIUM ION: 1.06 mmol/L — AB (ref 1.13–1.30)
CHLORIDE: 101 mmol/L (ref 101–111)
CREATININE: 0.9 mg/dL (ref 0.61–1.24)
GLUCOSE: 128 mg/dL — AB (ref 65–99)
HCT: 41 % (ref 39.0–52.0)
Hemoglobin: 13.9 g/dL (ref 13.0–17.0)
POTASSIUM: 3.7 mmol/L (ref 3.5–5.1)
Sodium: 138 mmol/L (ref 135–145)
TCO2: 25 mmol/L (ref 0–100)

## 2015-10-31 LAB — SAMPLE TO BLOOD BANK

## 2015-10-31 LAB — COMPREHENSIVE METABOLIC PANEL
ALK PHOS: 63 U/L (ref 38–126)
ALT: 54 U/L (ref 17–63)
AST: 53 U/L — ABNORMAL HIGH (ref 15–41)
Albumin: 4 g/dL (ref 3.5–5.0)
Anion gap: 10 (ref 5–15)
BILIRUBIN TOTAL: 0.9 mg/dL (ref 0.3–1.2)
BUN: 13 mg/dL (ref 6–20)
CALCIUM: 9.5 mg/dL (ref 8.9–10.3)
CO2: 23 mmol/L (ref 22–32)
CREATININE: 1.03 mg/dL (ref 0.61–1.24)
Chloride: 102 mmol/L (ref 101–111)
Glucose, Bld: 129 mg/dL — ABNORMAL HIGH (ref 65–99)
Potassium: 3.7 mmol/L (ref 3.5–5.1)
Sodium: 135 mmol/L (ref 135–145)
TOTAL PROTEIN: 7.4 g/dL (ref 6.5–8.1)

## 2015-10-31 LAB — CDS SEROLOGY

## 2015-10-31 LAB — CBC
HCT: 41.8 % (ref 39.0–52.0)
Hemoglobin: 14 g/dL (ref 13.0–17.0)
MCH: 27.8 pg (ref 26.0–34.0)
MCHC: 33.5 g/dL (ref 30.0–36.0)
MCV: 83.1 fL (ref 78.0–100.0)
PLATELETS: 196 10*3/uL (ref 150–400)
RBC: 5.03 MIL/uL (ref 4.22–5.81)
RDW: 12.2 % (ref 11.5–15.5)
WBC: 5.9 10*3/uL (ref 4.0–10.5)

## 2015-10-31 LAB — ETHANOL

## 2015-10-31 LAB — PROTIME-INR
INR: 1.17
PROTHROMBIN TIME: 14.9 s (ref 11.4–15.2)

## 2015-10-31 LAB — I-STAT CG4 LACTIC ACID, ED: LACTIC ACID, VENOUS: 2.03 mmol/L — AB (ref 0.5–1.9)

## 2015-10-31 MED ORDER — IOPAMIDOL (ISOVUE-300) INJECTION 61%
INTRAVENOUS | Status: AC
  Administered 2015-10-31: 100 mL
  Filled 2015-10-31: qty 100

## 2015-10-31 MED ORDER — HYDROMORPHONE HCL 1 MG/ML IJ SOLN
INTRAMUSCULAR | Status: AC | PRN
  Administered 2015-10-31 (×2): 1 mg via INTRAVENOUS

## 2015-10-31 MED ORDER — SODIUM CHLORIDE 0.9 % IV SOLN
INTRAVENOUS | Status: AC | PRN
  Administered 2015-10-31: 1000 mL via INTRAVENOUS

## 2015-10-31 MED ORDER — BACITRACIN ZINC 500 UNIT/GM EX OINT: 1.0000 "application " | TOPICAL_OINTMENT | Freq: Two times a day (BID) | CUTANEOUS | 0 refills | Status: DC

## 2015-10-31 MED ORDER — HYDROMORPHONE HCL 1 MG/ML IJ SOLN
INTRAMUSCULAR | Status: AC
  Filled 2015-10-31: qty 1

## 2015-10-31 MED ORDER — CYCLOBENZAPRINE HCL 10 MG PO TABS: 10.0000 mg | ORAL_TABLET | Freq: Three times a day (TID) | ORAL | 0 refills | Status: AC | PRN
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
## 2015-10-31 NOTE — ED Notes (Signed)
Patient unable to provide urine sample. MD made aware, states okay for patient to leave without obtaining it.

## 2015-10-31 NOTE — ED Notes (Signed)
Pt to CT
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
## 2015-10-31 NOTE — ED Notes (Signed)
Patient back from CT. A&O x 4.

## 2015-10-31 NOTE — ED Notes (Signed)
Parents are at bedside. Asking can patient have drink or food. Advised not to. Spoke with Dr. Olen Pel and said he was looking at results and would be in shortly.

## 2015-11-03 ENCOUNTER — Encounter: Payer: Self-pay | Admitting: Family

## 2015-11-03 DIAGNOSIS — F1120 Opioid dependence, uncomplicated: Secondary | ICD-10-CM | POA: Diagnosis not present

## 2015-11-05 DIAGNOSIS — F1120 Opioid dependence, uncomplicated: Secondary | ICD-10-CM | POA: Diagnosis not present

## 2015-12-04 ENCOUNTER — Emergency Department (HOSPITAL_COMMUNITY)
Admission: EM | Admit: 2015-12-04 | Discharge: 2015-12-04 | Disposition: A | Discharge status: Home / Self Care | Payer: 59 | Source: Home / Self Care | Attending: Emergency Medicine | Admitting: Emergency Medicine

## 2015-12-04 ENCOUNTER — Emergency Department (HOSPITAL_COMMUNITY): Payer: 59

## 2015-12-04 ENCOUNTER — Encounter (HOSPITAL_COMMUNITY): Payer: Self-pay | Admitting: Emergency Medicine

## 2015-12-04 DIAGNOSIS — I252 Old myocardial infarction: Secondary | ICD-10-CM | POA: Insufficient documentation

## 2015-12-04 DIAGNOSIS — R0602 Shortness of breath: Secondary | ICD-10-CM | POA: Diagnosis not present

## 2015-12-04 DIAGNOSIS — I071 Rheumatic tricuspid insufficiency: Secondary | ICD-10-CM | POA: Diagnosis not present

## 2015-12-04 DIAGNOSIS — J189 Pneumonia, unspecified organism: Secondary | ICD-10-CM

## 2015-12-04 DIAGNOSIS — Z7982 Long term (current) use of aspirin: Secondary | ICD-10-CM

## 2015-12-04 DIAGNOSIS — F17210 Nicotine dependence, cigarettes, uncomplicated: Secondary | ICD-10-CM | POA: Diagnosis not present

## 2015-12-04 DIAGNOSIS — Z79899 Other long term (current) drug therapy: Secondary | ICD-10-CM

## 2015-12-04 DIAGNOSIS — E871 Hypo-osmolality and hyponatremia: Secondary | ICD-10-CM | POA: Diagnosis not present

## 2015-12-04 DIAGNOSIS — F1320 Sedative, hypnotic or anxiolytic dependence, uncomplicated: Secondary | ICD-10-CM | POA: Diagnosis not present

## 2015-12-04 DIAGNOSIS — I330 Acute and subacute infective endocarditis: Secondary | ICD-10-CM | POA: Diagnosis not present

## 2015-12-04 DIAGNOSIS — I2690 Septic pulmonary embolism without acute cor pulmonale: Secondary | ICD-10-CM | POA: Diagnosis not present

## 2015-12-04 DIAGNOSIS — D6959 Other secondary thrombocytopenia: Secondary | ICD-10-CM | POA: Diagnosis not present

## 2015-12-04 DIAGNOSIS — R1012 Left upper quadrant pain: Secondary | ICD-10-CM | POA: Diagnosis not present

## 2015-12-04 DIAGNOSIS — B9561 Methicillin susceptible Staphylococcus aureus infection as the cause of diseases classified elsewhere: Secondary | ICD-10-CM | POA: Diagnosis not present

## 2015-12-04 DIAGNOSIS — F1910 Other psychoactive substance abuse, uncomplicated: Secondary | ICD-10-CM | POA: Diagnosis not present

## 2015-12-04 DIAGNOSIS — B181 Chronic viral hepatitis B without delta-agent: Secondary | ICD-10-CM | POA: Diagnosis not present

## 2015-12-04 DIAGNOSIS — R079 Chest pain, unspecified: Secondary | ICD-10-CM | POA: Diagnosis not present

## 2015-12-04 HISTORY — DX: Pneumonia, unspecified organism: J18.9

## 2015-12-04 LAB — RAPID URINE DRUG SCREEN, HOSP PERFORMED
AMPHETAMINES: NOT DETECTED
BARBITURATES: NOT DETECTED
Benzodiazepines: NOT DETECTED
Cocaine: NOT DETECTED
OPIATES: POSITIVE — AB
TETRAHYDROCANNABINOL: NOT DETECTED

## 2015-12-04 LAB — URINALYSIS, ROUTINE W REFLEX MICROSCOPIC
BILIRUBIN URINE: NEGATIVE
GLUCOSE, UA: NEGATIVE mg/dL
HGB URINE DIPSTICK: NEGATIVE
Ketones, ur: 15 mg/dL — AB
Leukocytes, UA: NEGATIVE
Nitrite: NEGATIVE
PH: 6 (ref 5.0–8.0)
Protein, ur: NEGATIVE mg/dL
SPECIFIC GRAVITY, URINE: 1.014 (ref 1.005–1.030)

## 2015-12-04 LAB — BASIC METABOLIC PANEL
Anion gap: 10 (ref 5–15)
BUN: 10 mg/dL (ref 6–20)
CO2: 25 mmol/L (ref 22–32)
Calcium: 8.4 mg/dL — ABNORMAL LOW (ref 8.9–10.3)
Chloride: 92 mmol/L — ABNORMAL LOW (ref 101–111)
Creatinine, Ser: 0.75 mg/dL (ref 0.61–1.24)
GFR calc Af Amer: >60 mL/min (ref >60)
GFR calc non Af Amer: >60 mL/min (ref >60)
Glucose, Bld: 149 mg/dL — ABNORMAL HIGH (ref 65–99)
Potassium: 3.6 mmol/L (ref 3.5–5.1)
Sodium: 127 mmol/L — ABNORMAL LOW (ref 135–145)

## 2015-12-04 LAB — HEPATIC FUNCTION PANEL
ALT: 23 U/L (ref 17–63)
AST: 28 U/L (ref 15–41)
Albumin: 3.3 g/dL — ABNORMAL LOW (ref 3.5–5.0)
Alkaline Phosphatase: 75 U/L (ref 38–126)
BILIRUBIN DIRECT: 0.2 mg/dL (ref 0.1–0.5)
BILIRUBIN INDIRECT: 0.6 mg/dL (ref 0.3–0.9)
Total Bilirubin: 0.8 mg/dL (ref 0.3–1.2)
Total Protein: 7.4 g/dL (ref 6.5–8.1)

## 2015-12-04 LAB — I-STAT TROPONIN, ED: Troponin i, poc: 0 ng/mL (ref 0.00–0.08)

## 2015-12-04 LAB — CBC
HCT: 40.5 % (ref 39.0–52.0)
Hemoglobin: 14 g/dL (ref 13.0–17.0)
MCH: 27.7 pg (ref 26.0–34.0)
MCHC: 34.6 g/dL (ref 30.0–36.0)
MCV: 80 fL (ref 78.0–100.0)
Platelets: 77 10*3/uL — ABNORMAL LOW (ref 150–400)
RBC: 5.06 MIL/uL (ref 4.22–5.81)
RDW: 12.6 % (ref 11.5–15.5)
WBC: 10.1 10*3/uL (ref 4.0–10.5)

## 2015-12-04 LAB — LIPASE, BLOOD: LIPASE: 17 U/L (ref 11–51)

## 2015-12-04 LAB — CK: Total CK: 12 U/L — ABNORMAL LOW (ref 49–397)

## 2015-12-04 MED ORDER — LEVOFLOXACIN 750 MG PO TABS: 750.0000 mg | ORAL_TABLET | Freq: Every day | ORAL | 0 refills | Status: DC

## 2015-12-04 MED ORDER — LEVOFLOXACIN IN D5W 750 MG/150ML IV SOLN: 750.0000 mg | Freq: Once | INTRAVENOUS | Status: DC

## 2015-12-04 MED ORDER — ACETAMINOPHEN 325 MG PO TABS
650.0000 mg | ORAL_TABLET | Freq: Once | ORAL | Status: AC | PRN
  Administered 2015-12-04: 650 mg via ORAL
  Filled 2015-12-04: qty 2

## 2015-12-04 MED ORDER — SODIUM CHLORIDE 0.9 % IV BOLUS (SEPSIS): 1000.0000 mL | Freq: Once | INTRAVENOUS | Status: DC

## 2015-12-04 MED ORDER — LEVOFLOXACIN 750 MG PO TABS
750.0000 mg | ORAL_TABLET | Freq: Every day | ORAL | Status: DC
  Administered 2015-12-04: 750 mg via ORAL
  Filled 2015-12-04: qty 1

## 2015-12-04 NOTE — ED Provider Notes (Signed)
[facility] DEPT Provider Note   CSN: [id number] Arrival date & time: 12/04/15  1222     History   Chief Complaint Chief Complaint  Patient presents with  . Chest Pain  . Shortness of Breath    HPI Erik [name] is a 20 y.o. male who presents with acute onset of chest pain and SOB since today. 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 His symptoms started 1 week ago with a "migraine". He reported dizziness, tinnitus at that time. His headache has since gone away. He has been having subjective fever, chills, and sweats over the last 3-4 days. Today he had an acute onset of left sided chest pain and inability to get a full breath in as well as productive cough. Associated decreased appetite, nausea, and dry heaves. Denies current HA, lightheadedness/dizziness, syncope, weakness, abdominal pain, vomiting, diarrhea, flank pain, back pain, sick contacts.  HPI  Past Medical History:  Diagnosis Date  . Anxiety   . Drug abuse and dependence ([place])    admit with OD 11/2014: rhabdo and AKI, ARF -   . Hepatitis B   . IV drug user    heroine  . Sinusitis     Patient Active Problem List   Diagnosis Date Noted  . Cocaine abuse   . Lower extremity pain, bilateral 02/22/2015  . Hyperkalemia 02/22/2015  . Acute kidney injury ([place]) 02/22/2015  . Elevated troponin 02/22/2015  . Leukocytosis 02/22/2015  . Acute renal failure due to rhabdomyolysis ([place]) 02/22/2015  . Dyspnea   . Endocarditis   . NSTEMI (non-ST elevated myocardial infarction) ([place])   . Traumatic rhabdomyolysis ([place]) 11/10/2014  . Viral hepatitis B chronic ([place]) 11/10/2014  . Left leg pain   . IVDU (intravenous drug user)   . Polysubstance dependence ([place]) 11/16/2013  . Benzodiazepine dependence ([place]) 11/16/2013  . Drug overdose 04/16/2013    Past Surgical History:  Procedure Laterality Date  . CARDIAC CATHETERIZATION N/A  02/22/2015   Procedure: Left Heart Cath and Coronary Angiography;  Surgeon: Troy Sine, MD;  Location: [facility];  Service: Cardiovascular;  Laterality: N/A;  . NO PAST SURGERIES    . TEE WITHOUT CARDIOVERSION N/A 02/24/2015   Procedure: TRANSESOPHAGEAL ECHOCARDIOGRAM (TEE);  Surgeon: Skeet Latch, MD;  Location: [facility] END[facility];  Service: Cardiovascular;  Laterality: N/A;       Home Medications    Prior to Admission medications   Medication Sig Start Date End Date Taking? Authorizing Provider  acetaminophen (TYLENOL) 500 MG tablet Take 1,000 mg by mouth every 6 (six) hours as needed for moderate pain.   Yes Historical Provider, MD  aspirin EC 81 MG EC tablet Take 1 tablet (81 mg total) by mouth daily. 02/26/15   Shanker Kristeen Mans, MD  bacitracin ointment Apply 1 application topically 2 (two) times daily. Patient not taking: Reported on 12/04/2015 10/31/15   Chapman Moss, MD  carvedilol (COREG) 3.125 MG tablet Take 1 tablet (3.125 mg total) by mouth 2 (two) times daily with a meal. Patient not taking: Reported on 12/04/2015 02/26/15   Jonetta Osgood, MD  oxyCODONE (OXY IR/ROXICODONE) 5 MG immediate release tablet Take 1-2 tablets (5-10 mg total) by mouth every 6 (six) hours as needed for moderate pain. Patient not taking: Reported on 12/04/2015 02/26/15   Jonetta Osgood, MD    Family History Family History  Problem Relation Age of Onset  .  Adopted: Yes    Social History Social History  Substance Use Topics  . Smoking status: Current Every Day Smoker    Packs/day: 1.00    Types: Cigarettes  . Smokeless tobacco: Never Used  . Alcohol use Yes     Allergies   Penicillins; Penicillins; Penicillins; Sulfa antibiotics; Sulfa antibiotics; and Sulfa antibiotics   Review of Systems Review of Systems  Constitutional: Positive for appetite change, chills, diaphoresis and fever.  Respiratory: Positive for cough and shortness of breath.   Cardiovascular: Positive for  chest pain.  Gastrointestinal: Positive for nausea. Negative for abdominal pain, diarrhea and vomiting.  Genitourinary: Negative for flank pain.  Musculoskeletal: Positive for myalgias. Negative for back pain.       Generalized myalgias  Neurological: Negative for dizziness, syncope, weakness, light-headedness and headaches.     Physical Exam Updated Vital Signs BP 119/82 (BP Location: Right Arm)   Pulse 86   Temp 97.9 F (36.6 C) (Oral)   Resp 22   Ht 5\' 6"  (1.676 m)   Wt 59 kg   SpO2 100%   BMI 20.98 kg/m   Physical Exam  Constitutional: He is oriented to person, place, and time. He appears well-developed and well-nourished. He appears ill. No distress.  HENT:  Head: Normocephalic and atraumatic.  Right Ear: Hearing, tympanic membrane, external ear and ear canal normal.  Left Ear: Hearing, tympanic membrane, external ear and ear canal normal.  Nose: Nose normal.  Mouth/Throat: Uvula is midline, oropharynx is clear and moist and mucous membranes are normal.  Eyes: Conjunctivae are normal. Pupils are equal, round, and reactive to light. Right eye exhibits no discharge. Left eye exhibits no discharge. No scleral icterus.  Neck: Normal range of motion. Neck supple.  Cardiovascular: Normal rate and regular rhythm.  Exam reveals no gallop and no friction rub.   No murmur heard. Pulmonary/Chest: Effort normal and breath sounds normal. No respiratory distress. He has no wheezes. He has no rales. He exhibits no tenderness.  Abdominal: Soft. Bowel sounds are normal. He exhibits no distension and no mass. There is tenderness. There is no rebound and no guarding.  Epigastric and LUQ tenderness  Musculoskeletal: He exhibits no edema.  Neurological: He is alert and oriented to person, place, and time.  Skin: Skin is warm and dry.  Psychiatric: He has a normal mood and affect. His behavior is normal.  Nursing note and vitals reviewed.    ED Treatments / Results  Labs (all labs  ordered are listed, but only abnormal results are displayed) Labs Reviewed  BASIC METABOLIC PANEL - Abnormal; Notable for the following:       Result Value   Sodium 127 (*)    Chloride 92 (*)    Glucose, Bld 149 (*)    Calcium 8.4 (*)    All other components within normal limits  CBC - Abnormal; Notable for the following:    Platelets 77 (*)    All other components within normal limits  URINALYSIS, ROUTINE W REFLEX MICROSCOPIC (NOT AT [facility]) - Abnormal; Notable for the following:    Color, Urine AMBER (*)    Ketones, ur 15 (*)    All other components within normal limits  URINE RAPID DRUG SCREEN, HOSP PERFORMED - Abnormal; Notable for the following:    Opiates POSITIVE (*)    All other components within normal limits  CK - Abnormal; Notable for the following:    Total CK 12 (*)    All other components within  normal limits  HEPATIC FUNCTION PANEL - Abnormal; Notable for the following:    Albumin 3.3 (*)    All other components within normal limits  CULTURE, BLOOD (ROUTINE X 2)  CULTURE, BLOOD (ROUTINE X 2)  LIPASE, BLOOD  I-STAT TROPOININ, ED    EKG  EKG Interpretation None       Radiology Dg Chest 2 View  Result Date: 12/04/2015 CLINICAL DATA:  Left-sided chest pain, shortness of breath, former smoking history EXAM: CHEST  2 VIEW COMPARISON:  Chest x-ray of 02/22/2015 FINDINGS: There are vague parenchymal opacities scattered throughout the right lung. In this young patient an infectious or inflammatory process would seem most likely. No definite opacity is seen throughout the left lung. There are somewhat prominent perihilar markings better seen on the lateral view which may indicate bronchitis. Mediastinal and hilar contours are unremarkable with no evidence of hilar or adenopathy. The heart is within normal limits in size. No bony abnormality is seen. IMPRESSION: Patchy parenchymal opacities throughout the right lung most likely inflammatory or infectious in etiology.  Recommend followup. No adenopathy. Also question bronchitis. Electronically Signed   By: Ivar Drape M.D.   On: 12/04/2015 14:29    Procedures Procedures (including critical care time)  Medications Ordered in ED Medications  levofloxacin (LEVAQUIN) tablet 750 mg (750 mg Oral Given 12/04/15 2020)  acetaminophen (TYLENOL) tablet 650 mg (650 mg Oral Given 12/04/15 1306)     Initial Impression / Assessment and Plan / ED Course  I have reviewed the triage vital signs and the nursing notes.  Pertinent labs & imaging results that were available during my care of the patient were reviewed by me and considered in my medical decision making (see chart for details).  Clinical Course   20 year old male presents with CAP. He is febrile and tachycardic with improvement on administration of Tylenol. CXR remarkable for patchy parenchymal opacities throughout the right lung. CBC unremarkable. BMP remarkable for hyponatremia and hypochloremia. Glucose is 149. UA remarkable for 15 ketones. UDS positive for opiates. Blood cultures drawn. Due to allergy will treat with 5 days of Levaquin with first dose given in ED and tolerated. Supportive care and fluids encouraged. Patient is NAD, non-toxic, with stable VS. Patient is informed of clinical course, understands medical decision making process, and agrees with plan. Opportunity for questions provided and all questions answered. Return precautions given.    Final Clinical Impressions(s) / ED Diagnoses   Final diagnoses:  Community acquired pneumonia    New Prescriptions Discharge Medication List as of 12/04/2015 10:10 PM    START taking these medications   Details  levofloxacin (LEVAQUIN) 750 MG tablet Take 1 tablet (750 mg total) by mouth daily., Starting Thu 12/04/2015, Print         Recardo Evangelist, PA-C 12/04/15 [id number]    Virgel Manifold, MD 12/10/15 [phone]

## 2015-12-04 NOTE — ED Triage Notes (Signed)
Pt reports body aches, congestion, and migraines x1 week. Pt states he has had SOB and left chest pain since this morning. Denies pain radiation, N/V/D.

## 2015-12-04 NOTE — ED Notes (Signed)
I attempted twice to collect blood cultures and was unsuccessful 

## 2015-12-04 NOTE — ED Notes (Signed)
Writer drew the first set of blood cultures.

## 2015-12-04 NOTE — ED Notes (Signed)
Patient's lung sounds are CTAB, heart sounds S1/S2 with no rub or murmur.

## 2015-12-04 NOTE — ED Provider Notes (Signed)
Medical screening examination/treatment/procedure(s) were performed by non-physician practitioner and as supervising physician I was immediately available for consultation/collaboration.   EKG Interpretation None      20yM with fever. Will cover for possible pneumonia. With cough and CXR findings. Hyponatremia noted. SIADH from pulmonary process? Clinically he doesn't appear volume depleted. He is no encephalopathic. No increased WOB. o2 sat normal on RA. HD stable. I feel he is appropriate for outpt tx. Will obtain blood cultures because of hx of IVDU and endocarditis. His current symptoms are fairly acute though and suspect from CAP.    Erik Manifold, MD 12/04/15 2001

## 2015-12-05 ENCOUNTER — Telehealth (HOSPITAL_COMMUNITY): Payer: Self-pay

## 2015-12-05 ENCOUNTER — Inpatient Hospital Stay (HOSPITAL_COMMUNITY)
Admission: AD | Admit: 2015-12-05 | Discharge: 2015-12-16 | DRG: 288 | Disposition: A | Discharge status: Home / Self Care | Payer: 59 | Source: Ambulatory Visit | Attending: Family Medicine | Admitting: Family Medicine

## 2015-12-05 ENCOUNTER — Encounter (HOSPITAL_COMMUNITY): Payer: Self-pay | Admitting: General Practice

## 2015-12-05 DIAGNOSIS — B9561 Methicillin susceptible Staphylococcus aureus infection as the cause of diseases classified elsewhere: Secondary | ICD-10-CM | POA: Diagnosis not present

## 2015-12-05 DIAGNOSIS — K0381 Cracked tooth: Secondary | ICD-10-CM | POA: Diagnosis present

## 2015-12-05 DIAGNOSIS — F1990 Other psychoactive substance use, unspecified, uncomplicated: Secondary | ICD-10-CM | POA: Diagnosis not present

## 2015-12-05 DIAGNOSIS — E871 Hypo-osmolality and hyponatremia: Secondary | ICD-10-CM | POA: Diagnosis present

## 2015-12-05 DIAGNOSIS — I071 Rheumatic tricuspid insufficiency: Secondary | ICD-10-CM | POA: Diagnosis present

## 2015-12-05 DIAGNOSIS — Z880 Allergy status to penicillin: Secondary | ICD-10-CM | POA: Diagnosis not present

## 2015-12-05 DIAGNOSIS — B181 Chronic viral hepatitis B without delta-agent: Secondary | ICD-10-CM | POA: Diagnosis not present

## 2015-12-05 DIAGNOSIS — I2690 Septic pulmonary embolism without acute cor pulmonale: Secondary | ICD-10-CM | POA: Diagnosis not present

## 2015-12-05 DIAGNOSIS — I339 Acute and subacute endocarditis, unspecified: Secondary | ICD-10-CM | POA: Diagnosis not present

## 2015-12-05 DIAGNOSIS — F17210 Nicotine dependence, cigarettes, uncomplicated: Secondary | ICD-10-CM | POA: Diagnosis present

## 2015-12-05 DIAGNOSIS — I369 Nonrheumatic tricuspid valve disorder, unspecified: Secondary | ICD-10-CM | POA: Diagnosis not present

## 2015-12-05 DIAGNOSIS — E869 Volume depletion, unspecified: Secondary | ICD-10-CM | POA: Diagnosis present

## 2015-12-05 DIAGNOSIS — I252 Old myocardial infarction: Secondary | ICD-10-CM

## 2015-12-05 DIAGNOSIS — R7881 Bacteremia: Secondary | ICD-10-CM | POA: Diagnosis not present

## 2015-12-05 DIAGNOSIS — E876 Hypokalemia: Secondary | ICD-10-CM | POA: Diagnosis present

## 2015-12-05 DIAGNOSIS — R6884 Jaw pain: Secondary | ICD-10-CM | POA: Diagnosis not present

## 2015-12-05 DIAGNOSIS — I330 Acute and subacute infective endocarditis: Principal | ICD-10-CM | POA: Diagnosis present

## 2015-12-05 DIAGNOSIS — D72829 Elevated white blood cell count, unspecified: Secondary | ICD-10-CM | POA: Diagnosis present

## 2015-12-05 DIAGNOSIS — Z882 Allergy status to sulfonamides status: Secondary | ICD-10-CM | POA: Diagnosis not present

## 2015-12-05 DIAGNOSIS — D696 Thrombocytopenia, unspecified: Secondary | ICD-10-CM | POA: Diagnosis not present

## 2015-12-05 DIAGNOSIS — I079 Rheumatic tricuspid valve disease, unspecified: Secondary | ICD-10-CM | POA: Diagnosis present

## 2015-12-05 DIAGNOSIS — I368 Other nonrheumatic tricuspid valve disorders: Secondary | ICD-10-CM

## 2015-12-05 DIAGNOSIS — I361 Nonrheumatic tricuspid (valve) insufficiency: Secondary | ICD-10-CM | POA: Diagnosis not present

## 2015-12-05 DIAGNOSIS — R0600 Dyspnea, unspecified: Secondary | ICD-10-CM | POA: Diagnosis not present

## 2015-12-05 DIAGNOSIS — B182 Chronic viral hepatitis C: Secondary | ICD-10-CM | POA: Diagnosis not present

## 2015-12-05 DIAGNOSIS — F419 Anxiety disorder, unspecified: Secondary | ICD-10-CM | POA: Diagnosis present

## 2015-12-05 DIAGNOSIS — I38 Endocarditis, valve unspecified: Secondary | ICD-10-CM | POA: Diagnosis not present

## 2015-12-05 DIAGNOSIS — R0602 Shortness of breath: Secondary | ICD-10-CM | POA: Diagnosis present

## 2015-12-05 DIAGNOSIS — D6959 Other secondary thrombocytopenia: Secondary | ICD-10-CM | POA: Diagnosis present

## 2015-12-05 DIAGNOSIS — I39 Endocarditis and heart valve disorders in diseases classified elsewhere: Secondary | ICD-10-CM | POA: Diagnosis not present

## 2015-12-05 DIAGNOSIS — R0789 Other chest pain: Secondary | ICD-10-CM | POA: Diagnosis not present

## 2015-12-05 DIAGNOSIS — R079 Chest pain, unspecified: Secondary | ICD-10-CM | POA: Diagnosis present

## 2015-12-05 DIAGNOSIS — F1910 Other psychoactive substance abuse, uncomplicated: Secondary | ICD-10-CM | POA: Diagnosis present

## 2015-12-05 DIAGNOSIS — F1320 Sedative, hypnotic or anxiolytic dependence, uncomplicated: Secondary | ICD-10-CM | POA: Diagnosis present

## 2015-12-05 HISTORY — DX: Acute and subacute infective endocarditis: I33.0

## 2015-12-05 HISTORY — DX: Pneumonia, unspecified organism: J18.9

## 2015-12-05 HISTORY — DX: Septic pulmonary embolism without acute cor pulmonale: I26.90

## 2015-12-05 HISTORY — DX: Rheumatic tricuspid insufficiency: I07.1

## 2015-12-05 HISTORY — DX: Non-ST elevation (NSTEMI) myocardial infarction: I21.4

## 2015-12-05 HISTORY — DX: Personal history of other diseases of the circulatory system: Z86.79

## 2015-12-05 LAB — RAPID URINE DRUG SCREEN, HOSP PERFORMED
Amphetamines: NOT DETECTED
BARBITURATES: NOT DETECTED
Benzodiazepines: NOT DETECTED
COCAINE: NOT DETECTED
OPIATES: POSITIVE — AB
Tetrahydrocannabinol: NOT DETECTED

## 2015-12-05 LAB — URINALYSIS, ROUTINE W REFLEX MICROSCOPIC
BILIRUBIN URINE: NEGATIVE
Glucose, UA: NEGATIVE mg/dL
Hgb urine dipstick: NEGATIVE
KETONES UR: NEGATIVE mg/dL
Leukocytes, UA: NEGATIVE
NITRITE: NEGATIVE
Protein, ur: NEGATIVE mg/dL
Specific Gravity, Urine: 1.019 (ref 1.005–1.030)
pH: 6.5 (ref 5.0–8.0)

## 2015-12-05 LAB — CBC WITH DIFFERENTIAL/PLATELET
BASOS PCT: 0 %
Basophils Absolute: 0 10*3/uL (ref 0.0–0.1)
EOS ABS: 0 10*3/uL (ref 0.0–0.7)
Eosinophils Relative: 0 %
HCT: 38.4 % — ABNORMAL LOW (ref 39.0–52.0)
Hemoglobin: 13.3 g/dL (ref 13.0–17.0)
Lymphocytes Relative: 7 %
Lymphs Abs: 1 10*3/uL (ref 0.7–4.0)
MCH: 27.9 pg (ref 26.0–34.0)
MCHC: 34.6 g/dL (ref 30.0–36.0)
MCV: 80.5 fL (ref 78.0–100.0)
MONO ABS: 2 10*3/uL — AB (ref 0.1–1.0)
MONOS PCT: 15 %
Neutro Abs: 10.6 10*3/uL — ABNORMAL HIGH (ref 1.7–7.7)
Neutrophils Relative %: 78 %
Platelets: 92 10*3/uL — ABNORMAL LOW (ref 150–400)
RBC: 4.77 MIL/uL (ref 4.22–5.81)
RDW: 12.4 % (ref 11.5–15.5)
WBC: 13.6 10*3/uL — ABNORMAL HIGH (ref 4.0–10.5)

## 2015-12-05 LAB — COMPREHENSIVE METABOLIC PANEL
ALT: 23 U/L (ref 17–63)
AST: 26 U/L (ref 15–41)
Albumin: 2.8 g/dL — ABNORMAL LOW (ref 3.5–5.0)
Alkaline Phosphatase: 79 U/L (ref 38–126)
Anion gap: 12 (ref 5–15)
BUN: 11 mg/dL (ref 6–20)
CALCIUM: 8.6 mg/dL — AB (ref 8.9–10.3)
CO2: 25 mmol/L (ref 22–32)
CREATININE: 0.74 mg/dL (ref 0.61–1.24)
Chloride: 92 mmol/L — ABNORMAL LOW (ref 101–111)
GFR calc non Af Amer: >60 mL/min (ref >60)
GLUCOSE: 171 mg/dL — AB (ref 65–99)
Potassium: 3.7 mmol/L (ref 3.5–5.1)
SODIUM: 129 mmol/L — AB (ref 135–145)
Total Bilirubin: 0.5 mg/dL (ref 0.3–1.2)
Total Protein: 6.8 g/dL (ref 6.5–8.1)

## 2015-12-05 LAB — BLOOD CULTURE ID PANEL (REFLEXED)
ACINETOBACTER BAUMANNII: NOT DETECTED
CANDIDA ALBICANS: NOT DETECTED
CANDIDA GLABRATA: NOT DETECTED
CANDIDA KRUSEI: NOT DETECTED
CANDIDA PARAPSILOSIS: NOT DETECTED
Candida tropicalis: NOT DETECTED
ENTEROBACTER CLOACAE COMPLEX: NOT DETECTED
ENTEROCOCCUS SPECIES: NOT DETECTED
ESCHERICHIA COLI: NOT DETECTED
Enterobacteriaceae species: NOT DETECTED
Haemophilus influenzae: NOT DETECTED
KLEBSIELLA OXYTOCA: NOT DETECTED
Klebsiella pneumoniae: NOT DETECTED
LISTERIA MONOCYTOGENES: NOT DETECTED
Methicillin resistance: NOT DETECTED
Neisseria meningitidis: NOT DETECTED
PSEUDOMONAS AERUGINOSA: NOT DETECTED
Proteus species: NOT DETECTED
STREPTOCOCCUS AGALACTIAE: NOT DETECTED
STREPTOCOCCUS PNEUMONIAE: NOT DETECTED
STREPTOCOCCUS PYOGENES: NOT DETECTED
Serratia marcescens: NOT DETECTED
Staphylococcus aureus (BCID): DETECTED — AB
Staphylococcus species: DETECTED — AB
Streptococcus species: NOT DETECTED

## 2015-12-05 LAB — SODIUM, URINE, RANDOM: Sodium, Ur: <10 mmol/L

## 2015-12-05 LAB — ETHANOL: Alcohol, Ethyl (B): <5 mg/dL

## 2015-12-05 LAB — MRSA PCR SCREENING: MRSA BY PCR: NEGATIVE

## 2015-12-05 LAB — OSMOLALITY: OSMOLALITY: 273 mosm/kg — AB (ref 275–295)

## 2015-12-05 LAB — OSMOLALITY, URINE: Osmolality, Ur: 560 mOsm/kg (ref 300–900)

## 2015-12-05 LAB — TROPONIN I: TROPONIN I: 0.03 ng/mL — AB (ref <0.03)

## 2015-12-05 LAB — LACTIC ACID, PLASMA: Lactic Acid, Venous: 3 mmol/L (ref 0.5–1.9)

## 2015-12-05 MED ORDER — ACETAMINOPHEN 325 MG PO TABS
650.0000 mg | ORAL_TABLET | Freq: Four times a day (QID) | ORAL | Status: DC | PRN
  Administered 2015-12-06: 650 mg via ORAL
  Filled 2015-12-05: qty 2

## 2015-12-05 MED ORDER — KETOROLAC TROMETHAMINE 15 MG/ML IJ SOLN
15.0000 mg | Freq: Four times a day (QID) | INTRAMUSCULAR | Status: AC | PRN
  Administered 2015-12-05 – 2015-12-09 (×8): 15 mg via INTRAVENOUS
  Filled 2015-12-05 (×8): qty 1

## 2015-12-05 MED ORDER — VANCOMYCIN HCL IN DEXTROSE 750-5 MG/150ML-% IV SOLN
750.0000 mg | Freq: Three times a day (TID) | INTRAVENOUS | Status: DC
  Administered 2015-12-06 – 2015-12-07 (×4): 750 mg via INTRAVENOUS
  Filled 2015-12-05 (×6): qty 150

## 2015-12-05 MED ORDER — ACETAMINOPHEN 650 MG RE SUPP: 650.0000 mg | Freq: Four times a day (QID) | RECTAL | Status: DC | PRN

## 2015-12-05 MED ORDER — BISACODYL 5 MG PO TBEC: 5.0000 mg | DELAYED_RELEASE_TABLET | Freq: Every day | ORAL | Status: DC | PRN

## 2015-12-05 MED ORDER — ONDANSETRON HCL 4 MG PO TABS: 4.0000 mg | ORAL_TABLET | Freq: Four times a day (QID) | ORAL | Status: DC | PRN

## 2015-12-05 MED ORDER — SODIUM CHLORIDE 0.9 % IV SOLN
INTRAVENOUS | Status: AC
  Administered 2015-12-05: 18:00:00 via INTRAVENOUS

## 2015-12-05 MED ORDER — TRAZODONE HCL 50 MG PO TABS
25.0000 mg | ORAL_TABLET | Freq: Every evening | ORAL | Status: DC | PRN
  Administered 2015-12-05 – 2015-12-13 (×5): 25 mg via ORAL
  Filled 2015-12-05 (×5): qty 1

## 2015-12-05 MED ORDER — VANCOMYCIN HCL IN DEXTROSE 1-5 GM/200ML-% IV SOLN
1000.0000 mg | Freq: Once | INTRAVENOUS | Status: AC
  Administered 2015-12-05: 1000 mg via INTRAVENOUS
  Filled 2015-12-05: qty 200

## 2015-12-05 MED ORDER — ONDANSETRON HCL 4 MG/2ML IJ SOLN: 4.0000 mg | Freq: Four times a day (QID) | INTRAMUSCULAR | Status: DC | PRN

## 2015-12-05 MED ORDER — SODIUM CHLORIDE 0.9 % IV BOLUS (SEPSIS)
1000.0000 mL | Freq: Once | INTRAVENOUS | Status: AC
  Administered 2015-12-05: 1000 mL via INTRAVENOUS

## 2015-12-05 MED ORDER — TRAMADOL HCL 50 MG PO TABS
50.0000 mg | ORAL_TABLET | Freq: Four times a day (QID) | ORAL | Status: DC | PRN
  Administered 2015-12-06: 50 mg via ORAL
  Filled 2015-12-05: qty 1

## 2015-12-05 NOTE — H&P (Signed)
History and Physical    Erik [name] [id number] DOB: 1995/07/08 DOA: 12/05/2015  PCP: Gwendolyn Grant, MD Patient coming from: home  Chief Complaint: sob/bacteremia  HPI: Erik [name] is a 20 y.o. male with medical history significant  for IV drug abuse, endocarditis, hepatitis B rhabdomyolysis, non-STEMI presents to [facility] room 36 after being informed of positive blood cultures taken yesterday.  Information is obtained from the patient. He went to the emergency department yesterday with complaints of fever chest pain. He was evaluated and discharged home on Levaquin for pneumonia. Blood cultures were drawn at that time positive for staph aureus. He reports left anterior chest pain worse with inspiration. The pain is sharp constant nonradiating. Denies diaphoresis nausea vomiting. He denies headache visual changes dizziness syncope or near-syncope. He denies fever chills dysuria hematuria frequency or urgency. He denies abdominal pain nausea vomiting diarrhea constipation melena bright red blood per rectum    ED Course: As a direct admit him home  Review of Systems: As per HPI otherwise 10 point review of systems negative.   Ambulatory Status: Ambulates independently with steady gait  Past Medical History:  Diagnosis Date  . Anxiety   . Drug abuse and dependence ([place])    admit with OD 11/2014: rhabdo and AKI, ARF -   . Hepatitis B   . IV drug user    heroine  . Sinusitis     Past Surgical History:  Procedure Laterality Date  . CARDIAC CATHETERIZATION N/A 02/22/2015   Procedure: Left Heart Cath and Coronary Angiography;  Surgeon: Troy Sine, MD;  Location: [facility];  Service: Cardiovascular;  Laterality: N/A;  . NO PAST SURGERIES    . TEE WITHOUT CARDIOVERSION N/A 02/24/2015   Procedure: TRANSESOPHAGEAL ECHOCARDIOGRAM (TEE);  Surgeon: Skeet Latch, MD;  Location: [facility] ENDOSCOPY;  Service: Cardiovascular;  Laterality: N/A;    Social History   Social  History  . Marital status: Single    Spouse name: N/A  . Number of children: 0  . Years of education: N/A   Occupational History  . Not on file.   Social History Main Topics  . Smoking status: Current Every Day Smoker    Packs/day: 1.00    Types: Cigarettes  . Smokeless tobacco: Never Used  . Alcohol use Yes  . Drug use:     Types: Cocaine, Marijuana, IV     Comment: marijuana, heroin  . Sexual activity: Not on file     Comment: heroine   Other Topics Concern  . Not on file   Social History Narrative   ** Merged History Encounter **       ** Merged History Encounter **      Allergies  Allergen Reactions  . Penicillins Anaphylaxis and Swelling    All "-cillins."  Angioedema also  . Sulfa Antibiotics Swelling and Rash    Childhood allergic reaction    Family History  Problem Relation Age of Onset  . Adopted: Yes    Prior to Admission medications   Medication Sig Start Date End Date Taking? Authorizing Provider  levofloxacin (LEVAQUIN) 750 MG tablet Take 1 tablet (750 mg total) by mouth daily. 12/04/15  Yes Recardo Evangelist, PA-C    Physical Exam: Vitals:   12/05/15 1539 12/05/15 1540  BP: 110/71   Pulse: (!) 104   Resp: 16   Temp: 100 F (37.8 C)   TempSrc: Oral   SpO2: 100%   Weight:  58.7 kg (129 lb 6.6  oz)  Height:  5\' 6"  (1.676 m)     General:  Appears calm and comfortable Eyes:  PERRL, EOMI, normal lids, iris ENT:  grossly normal hearing, lips & tongue, mmm Neck:  no LAD, masses or thyromegaly Cardiovascular:  RRR, no m/r/g. No LE edema.  Respiratory:  CTA bilaterally, no w/r/r. Normal respiratory effort. Abdomen:  soft, ntnd, Positive bowel sounds throughout no guarding or rebounding Skin:  no rash or induration seen on limited exam Musculoskeletal:  grossly normal tone BUE/BLE, good ROM, no bony abnormality Psychiatric:  grossly normal mood and affect, speech fluent and appropriate, AOx3 Neurologic:  CN 2-12 grossly intact, moves all  extremities in coordinated fashion, sensation intact  Labs on Admission: I have personally reviewed following labs and imaging studies  CBC:  Recent Labs Lab 12/04/15 1309 12/05/15 1600  WBC 10.1 13.6*  NEUTROABS  --  10.6*  HGB 14.0 13.3  HCT 40.5 38.4*  MCV 80.0 80.5  PLT 77* 92*   Basic Metabolic Panel:  Recent Labs Lab 12/04/15 1309 12/05/15 1600  NA 127* 129*  K 3.6 3.7  CL 92* 92*  CO2 25 25  GLUCOSE 149* 171*  BUN 10 11  CREATININE 0.75 0.74  CALCIUM 8.4* 8.6*   GFR: Estimated Creatinine Clearance: 120.3 mL/min (by C-G formula based on SCr of 0.74 mg/dL). Liver Function Tests:  Recent Labs Lab 12/04/15 1309 12/05/15 1600  AST 28 26  ALT 23 23  ALKPHOS 75 79  BILITOT 0.8 0.5  PROT 7.4 6.8  ALBUMIN 3.3* 2.8*    Recent Labs Lab 12/04/15 1309  LIPASE 17   No results for input(s): AMMONIA in the last 168 hours. Coagulation Profile: No results for input(s): INR, PROTIME in the last 168 hours. Cardiac Enzymes:  Recent Labs Lab 12/04/15 1309  CKTOTAL 12*   BNP (last 3 results) No results for input(s): PROBNP in the last 8760 hours. HbA1C: No results for input(s): HGBA1C in the last 72 hours. CBG: No results for input(s): GLUCAP in the last 168 hours. Lipid Profile: No results for input(s): CHOL, HDL, LDLCALC, TRIG, CHOLHDL, LDLDIRECT in the last 72 hours. Thyroid Function Tests: No results for input(s): TSH, T4TOTAL, FREET4, T3FREE, THYROIDAB in the last 72 hours. Anemia Panel: No results for input(s): VITAMINB12, FOLATE, FERRITIN, TIBC, IRON, RETICCTPCT in the last 72 hours. Urine analysis:    Component Value Date/Time   COLORURINE AMBER (A) 12/04/2015 1932   APPEARANCEUR CLEAR 12/04/2015 1932   APPEARANCEUR Clear 02/27/2014 0351   LABSPEC 1.014 12/04/2015 1932   LABSPEC 1.025 02/27/2014 0351   PHURINE 6.0 12/04/2015 1932   GLUCOSEU NEGATIVE 12/04/2015 1932   GLUCOSEU Negative 02/27/2014 0351   HGBUR NEGATIVE 12/04/2015 1932    BILIRUBINUR NEGATIVE 12/04/2015 1932   BILIRUBINUR Negative 02/27/2014 0351   KETONESUR 15 (A) 12/04/2015 1932   PROTEINUR NEGATIVE 12/04/2015 1932   UROBILINOGEN 0.2 11/12/2014 2124   NITRITE NEGATIVE 12/04/2015 1932   LEUKOCYTESUR NEGATIVE 12/04/2015 1932   LEUKOCYTESUR Negative 02/27/2014 0351    Creatinine Clearance: Estimated Creatinine Clearance: 120.3 mL/min (by C-G formula based on SCr of 0.74 mg/dL).  Sepsis Labs: @LABRCNTIP (procalcitonin:4,lacticidven:4) ) Recent Results (from the past 240 hour(s))  Culture, blood (routine x 2)     Status: None (Preliminary result)   Collection Time: 12/04/15  7:20 PM  Result Value Ref Range Status   Specimen Description BLOOD LEFT ANTECUBITAL  Final   Special Requests BOTTLES DRAWN AEROBIC AND ANAEROBIC 5 CC  Final   Culture  Setup  Time   Final    GRAM POSITIVE COCCI IN CLUSTERS IN BOTH AEROBIC AND ANAEROBIC BOTTLES CRITICAL RESULT CALLED TO, READ BACK BY AND VERIFIED WITH: P CLARK,RN AT [id number] 12/05/15 BY L BENFIELD Performed at [facility]  Final   Report Status PENDING  Incomplete  Blood Culture ID Panel (Reflexed)     Status: Abnormal   Collection Time: 12/04/15  7:20 PM  Result Value Ref Range Status   Enterococcus species NOT DETECTED NOT DETECTED Final   Listeria monocytogenes NOT DETECTED NOT DETECTED Final   Staphylococcus species DETECTED (A) NOT DETECTED Final    Comment: CRITICAL RESULT CALLED TO, READ BACK BY AND VERIFIED WITH: P CLARK,RN AT [id number] 12/05/15 BY L BENFIELD    Staphylococcus aureus DETECTED (A) NOT DETECTED Final    Comment: CRITICAL RESULT CALLED TO, READ BACK BY AND VERIFIED WITH: P CLARK,RN AT [id number] 12/05/15 BY L BENFIELD    Methicillin resistance NOT DETECTED NOT DETECTED Final   Streptococcus species NOT DETECTED NOT DETECTED Final   Streptococcus agalactiae NOT DETECTED NOT DETECTED Final   Streptococcus pneumoniae NOT DETECTED NOT DETECTED Final   Streptococcus  pyogenes NOT DETECTED NOT DETECTED Final   Acinetobacter baumannii NOT DETECTED NOT DETECTED Final   Enterobacteriaceae species NOT DETECTED NOT DETECTED Final   Enterobacter cloacae complex NOT DETECTED NOT DETECTED Final   Escherichia coli NOT DETECTED NOT DETECTED Final   Klebsiella oxytoca NOT DETECTED NOT DETECTED Final   Klebsiella pneumoniae NOT DETECTED NOT DETECTED Final   Proteus species NOT DETECTED NOT DETECTED Final   Serratia marcescens NOT DETECTED NOT DETECTED Final   Haemophilus influenzae NOT DETECTED NOT DETECTED Final   Neisseria meningitidis NOT DETECTED NOT DETECTED Final   Pseudomonas aeruginosa NOT DETECTED NOT DETECTED Final   Candida albicans NOT DETECTED NOT DETECTED Final   Candida glabrata NOT DETECTED NOT DETECTED Final   Candida krusei NOT DETECTED NOT DETECTED Final   Candida parapsilosis NOT DETECTED NOT DETECTED Final   Candida tropicalis NOT DETECTED NOT DETECTED Final    Comment: Performed at [facility]  Culture, blood (routine x 2)     Status: None (Preliminary result)   Collection Time: 12/04/15  8:40 PM  Result Value Ref Range Status   Specimen Description BLOOD RIGHT HAND  Final   Special Requests BOTTLES DRAWN AEROBIC AND ANAEROBIC 5CC  Final   Culture  Setup Time   Final    GRAM POSITIVE COCCI IN CLUSTERS IN BOTH AEROBIC AND ANAEROBIC BOTTLES CRITICAL RESULT CALLED TO, READ BACK BY AND VERIFIED WITH: P CLARK,RN AT [id number] 12/05/15 BY L BENFIELD Performed at [facility]  Final   Report Status PENDING  Incomplete     Radiological Exams on Admission: Dg Chest 2 View  Result Date: 12/04/2015 CLINICAL DATA:  Left-sided chest pain, shortness of breath, former smoking history EXAM: CHEST  2 VIEW COMPARISON:  Chest x-ray of 02/22/2015 FINDINGS: There are vague parenchymal opacities scattered throughout the right lung. In this young patient an infectious or inflammatory process would seem most likely. No  definite opacity is seen throughout the left lung. There are somewhat prominent perihilar markings better seen on the lateral view which may indicate bronchitis. Mediastinal and hilar contours are unremarkable with no evidence of hilar or adenopathy. The heart is within normal limits in size. No bony abnormality is seen. IMPRESSION: Patchy parenchymal opacities throughout the right lung most  likely inflammatory or infectious in etiology. Recommend followup. No adenopathy. Also question bronchitis. Electronically Signed   By: Ivar Drape M.D.   On: 12/04/2015 14:29    EKG: Independently reviewed. Sinus tachycardia  Assessment/Plan Principal Problem:   Bacteremia Active Problems:   Benzodiazepine dependence (HCC)   IVDU (intravenous drug user)   Viral hepatitis B chronic (H[facility])   Leukocytosis   Hyponatremia   Thrombocytopenia ([place])   #1. Bacteremia. MSSA. Clearly related to IV drug use. History of endocarditis hepatitis B well.  Max temperature 101. Tachycardia rate 107, acute cytosis 13.6. hemodynamically stable -Admit -Vancomycin per pharmacy -vigorous IV fluids -Echocardiogram. If negative consider TEE -Track lactic acid -Supportive therapy -Monitor closely  #2. Hyponatremia. Sodium level 127.  History of same. Asymptomatic. Likely related to #1 in the setting of IV drug use. Denies EtOH use -IV fluids as noted above -We'll obtain serum osmolality -Urine osmolality urine sodium  #3.thrombocytopenia. Likely related to above in setting of IV drug use. Platelet count 92 on admission. No signs symptoms bleeding -echo as noted above -scd's -monitor  4. Chronic viral hepatitis B. Hep function panel with albumin of 2.3 otherwise unremarkable -Monitor  #5. Leukocytosis. Related to #1. Max temp 101. Urinalysis unremarkable. Chest x-ray Patchy parenchymal opacities throughout the right lung most likely inflammatory or infectious in etiology. Recommend followup. Noadenopathy. Also question  bronchitis -Vancomycin per pharmacy    DVT prophylaxis: scd  Code Status: full  Family Communication: none present  Disposition Plan: home  Consults called: none  Admission status: inpatient    Radene Gunning MD [facility]  If 7PM-7AM, please contact night-coverage [email or web address] Password [email or web address]  12/05/2015, 5:50 PM

## 2015-12-05 NOTE — Telephone Encounter (Signed)
Late entry  12/05/2015 @ 13:04  Pt returned call.  Pt informed of cx results and need to return for IV abx.  Pt stated he will return to [facility] ED in an hour or two.

## 2015-12-05 NOTE — Progress Notes (Signed)
Pharmacy Antibiotic Note  Erik [name] is a 20 y.o. male admitted on 12/05/2015 with MSSA bacteremia.  Pharmacy consulted to initiate vancomycin (anaphylaxis to PCN).  Labs from 12/04/15 reviewed.  Plan: - Vanc 1gm IV x 1, then 750mg  IV Q8H - Monitor renal fxn, clinical progress, vanc trough at [facility]  Height: 5\' 6"  (167.6 cm) Weight: 129 lb 6.6 oz (58.7 kg) IBW/kg (Calculated) : 63.8  Temp (24hrs), Avg:100 F (37.8 C), Min:100 F (37.8 C), Max:100 F (37.8 C)   Recent Labs Lab 12/04/15 1309  WBC 10.1  CREATININE 0.75    Estimated Creatinine Clearance: 120.3 mL/min (by C-G formula based on SCr of 0.75 mg/dL).    Allergies  Allergen Reactions  . Penicillins Anaphylaxis and Swelling    All "-cillins."  Angioedema also  . Sulfa Antibiotics Swelling and Rash    Childhood allergic reaction    Antimicrobials this admission:  Vanc 9/29 >>  Dose adjustments this admission:  N/A  Microbiology results:  9/28 BCx x4 - [facility] (BCID Staph aureus)   Syvanna Ciolino D. Mina Marble, PharmD, BCPS Pager:  [phone] 12/05/2015, 4:05 PM

## 2015-12-05 NOTE — Progress Notes (Signed)
Pt admitted to the unit at 1506. Pt mental status is A&Ox4. Pt oriented to room, staff, and call bell. Skin is intact except where otherwise charted. Full assessment charted in [facility]. Call bell within reach. Visitor guidelines reviewed w/ pt and/or family.

## 2015-12-05 NOTE — Telephone Encounter (Signed)
Call from lab w/(+) blood cx x 4 bottles Gram (+) cocci in clusters.  ID panel showed Staph species and staph aureus.   Chart reviewed by A. Meyer PA "Call to come in for IV ABX w/bacteremia"  Called pts # no answer and no VM x 3 attempts.  Called pts emergency contact his mother Erik [name] and per mom just went out to run errand.  She will have pt return call.

## 2015-12-06 ENCOUNTER — Encounter (HOSPITAL_COMMUNITY): Payer: Self-pay | Admitting: Radiology

## 2015-12-06 ENCOUNTER — Inpatient Hospital Stay (HOSPITAL_COMMUNITY): Payer: 59

## 2015-12-06 DIAGNOSIS — R0789 Other chest pain: Secondary | ICD-10-CM

## 2015-12-06 DIAGNOSIS — E871 Hypo-osmolality and hyponatremia: Secondary | ICD-10-CM

## 2015-12-06 DIAGNOSIS — D696 Thrombocytopenia, unspecified: Secondary | ICD-10-CM

## 2015-12-06 DIAGNOSIS — R7881 Bacteremia: Secondary | ICD-10-CM | POA: Diagnosis present

## 2015-12-06 DIAGNOSIS — R079 Chest pain, unspecified: Secondary | ICD-10-CM | POA: Diagnosis present

## 2015-12-06 DIAGNOSIS — B9561 Methicillin susceptible Staphylococcus aureus infection as the cause of diseases classified elsewhere: Secondary | ICD-10-CM | POA: Diagnosis present

## 2015-12-06 LAB — CBC
HEMATOCRIT: 40.9 % (ref 39.0–52.0)
Hemoglobin: 14 g/dL (ref 13.0–17.0)
MCH: 28.2 pg (ref 26.0–34.0)
MCHC: 34.2 g/dL (ref 30.0–36.0)
MCV: 82.5 fL (ref 78.0–100.0)
PLATELETS: 86 10*3/uL — AB (ref 150–400)
RBC: 4.96 MIL/uL (ref 4.22–5.81)
RDW: 12.5 % (ref 11.5–15.5)
WBC: 9.2 10*3/uL (ref 4.0–10.5)

## 2015-12-06 LAB — BASIC METABOLIC PANEL
Anion gap: 8 (ref 5–15)
BUN: 12 mg/dL (ref 6–20)
CHLORIDE: 100 mmol/L — AB (ref 101–111)
CO2: 23 mmol/L (ref 22–32)
CREATININE: 0.71 mg/dL (ref 0.61–1.24)
Calcium: 8.3 mg/dL — ABNORMAL LOW (ref 8.9–10.3)
GFR calc Af Amer: >60 mL/min (ref >60)
GFR calc non Af Amer: >60 mL/min (ref >60)
GLUCOSE: 111 mg/dL — AB (ref 65–99)
Potassium: 3.9 mmol/L (ref 3.5–5.1)
SODIUM: 131 mmol/L — AB (ref 135–145)

## 2015-12-06 LAB — LACTIC ACID, PLASMA
LACTIC ACID, VENOUS: 1.3 mmol/L (ref 0.5–1.9)
LACTIC ACID, VENOUS: 1.9 mmol/L (ref 0.5–1.9)

## 2015-12-06 LAB — HEMOGLOBIN A1C
Hgb A1c MFr Bld: 5.5 % (ref 4.8–5.6)
Mean Plasma Glucose: 111 mg/dL

## 2015-12-06 LAB — TROPONIN I

## 2015-12-06 MED ORDER — SODIUM CHLORIDE 0.9 % IV SOLN
INTRAVENOUS | Status: DC
  Administered 2015-12-06 – 2015-12-07 (×2): via INTRAVENOUS

## 2015-12-06 MED ORDER — IOPAMIDOL (ISOVUE-370) INJECTION 76%
INTRAVENOUS | Status: AC
  Administered 2015-12-06: 100 mL
  Filled 2015-12-06: qty 100

## 2015-12-06 MED ORDER — SODIUM CHLORIDE 0.9 % IV SOLN
INTRAVENOUS | Status: DC
  Filled 2015-12-06: qty 1000

## 2015-12-06 NOTE — Progress Notes (Addendum)
PROGRESS NOTE  Erik [name] [id number] DOB: February 13, 1996 DOA: 12/05/2015 PCP: Gwendolyn Grant, MD  Brief History:  20 year old male with a history of intravenous drug use, anxiety, endocarditis, chronic hepatitis B presented after being informed of positive blood cultures that was obtained on 12/04/2015. Preliminary results show Staphylococcus aureus in 2 out of 2 sets. The patient initially presented to the emergency department on 12/04/2015 with chest pain and shortness of breath. The patient was given a dose of levofloxacin and discharged from the emergency department after blood cultures were obtained.  The patient states that he last used heroin approximately 2 weeks prior to this admission.  At that time, the patient was admitted for treatment of rhabdomyolysis, cellulitis, and NSTEMI. The patient also has had a previous admission back in September 2016 for narcotic induced respiratory failure requiring intubation. In addition, the patient has had a number of previous admissions in the past for narcotic and other illicit drug overdose. The patient was started on intravenous vancomycin and admitted for further evaluation of his bacteremia.  Assessment/Plan: Staphylococcus aureus bacteremia -Infectious disease will be consulted per protocol -Surveillance blood cultures -Echocardiogram -Already requested TEE--will be done 10/1 or 10/2 -Etiology likely to be intravenous drug use  Pulmonary opacities -Suspect metastatic infection -CT angio chest r/o septic emboli  Hyponatremia -Secondary to volume depletion -Improving with fluid resuscitation  Thrombocytopenia -Repeat HIV -Likely due to chronic liver disease secondary to chronic hepatitis B -B12  Chronic hepatitis B infection -Appears clinically compensated at this time -Outpatient follow-up  Chest pain -finish cycling troponin -personally reviewed EKG--sinus rhythm, nonspecific T-wave changes -Personally reviewed  CXR--vague parenchymal opacities in the right lung -CTA chest     Disposition Plan:   Not stable for discharge.  Anticipate several days Family Communication:   No Family at bedside  Consultants:  ID, Cardiology for TEE  Code Status:  FULL   DVT Prophylaxis:  SCDs   Procedures: As Listed in Progress Note Above  Antibiotics: None    Subjective: Patient continues to complain of intermittent chest pain that is pleuritic in nature. It is worse with movement and taking a deep breath. Mostly left-sided and sharp in nature. Denies any nausea, vomiting, diarrhea, abdominal pain, dysuria, hematuria. Denies any headache, neck pain, back pain.  Objective: Vitals:   12/05/15 1540 12/05/15 2128 12/06/15 0238 12/06/15 0541  BP:  (!) 107/48 (!) 110/58 (!) 97/42  Pulse:  95 86 73  Resp:  18 18 18   Temp:  99.3 F (37.4 C) (!) 100.5 F (38.1 C) 98.2 F (36.8 C)  TempSrc:  Oral Oral Oral  SpO2:  99% 100% 99%  Weight: 58.7 kg (129 lb 6.6 oz)     Height: 5\' 6"  (1.676 m)       Intake/Output Summary (Last 24 hours) at 12/06/15 1045 Last data filed at 12/06/15 0430  Gross per 24 hour  Intake           1462.5 ml  Output             1000 ml  Net            462.5 ml   Weight change:  Exam:   General:  Pt is alert, follows commands appropriately, not in acute distress  HEENT: No icterus, No thrush, No neck mass, [place]/AT  Cardiovascular: RRR, S1/S2, no rubs, no gallops  Respiratory: Bibasilar crackles, right greater than left. No wheezes  Abdomen: Soft/+BS, non tender,  non distended, no guarding  Extremities: No edema, No lymphangitis, No petechiae, No rashes, no synovitis   Data Reviewed: I have personally reviewed following labs and imaging studies Basic Metabolic Panel:  Recent Labs Lab 12/04/15 1309 12/05/15 1600 12/06/15 0226  NA 127* 129* 131*  K 3.6 3.7 3.9  CL 92* 92* 100*  CO2 25 25 23   GLUCOSE 149* 171* 111*  BUN 10 11 12   CREATININE 0.75 0.74 0.71    CALCIUM 8.4* 8.6* 8.3*   Liver Function Tests:  Recent Labs Lab 12/04/15 1309 12/05/15 1600  AST 28 26  ALT 23 23  ALKPHOS 75 79  BILITOT 0.8 0.5  PROT 7.4 6.8  ALBUMIN 3.3* 2.8*    Recent Labs Lab 12/04/15 1309  LIPASE 17   No results for input(s): AMMONIA in the last 168 hours. Coagulation Profile: No results for input(s): INR, PROTIME in the last 168 hours. CBC:  Recent Labs Lab 12/04/15 1309 12/05/15 1600 12/06/15 0226  WBC 10.1 13.6* 9.2  NEUTROABS  --  10.6*  --   HGB 14.0 13.3 14.0  HCT 40.5 38.4* 40.9  MCV 80.0 80.5 82.5  PLT 77* 92* 86*   Cardiac Enzymes:  Recent Labs Lab 12/04/15 1309 12/05/15 1828 12/05/15 2336  CKTOTAL 12*  --   --   TROPONINI  --  0.03* <0.03   BNP: Invalid input(s): POCBNP CBG: No results for input(s): GLUCAP in the last 168 hours. HbA1C:  Recent Labs  12/05/15 0530  HGBA1C 5.5   Urine analysis:    Component Value Date/Time   COLORURINE YELLOW 12/05/2015 2053   APPEARANCEUR CLOUDY (A) 12/05/2015 2053   APPEARANCEUR Clear 02/27/2014 0351   LABSPEC 1.019 12/05/2015 2053   LABSPEC 1.025 02/27/2014 0351   PHURINE 6.5 12/05/2015 2053   GLUCOSEU NEGATIVE 12/05/2015 2053   GLUCOSEU Negative 02/27/2014 0351   HGBUR NEGATIVE 12/05/2015 2053   BILIRUBINUR NEGATIVE 12/05/2015 2053   BILIRUBINUR Negative 02/27/2014 0351   KETONESUR NEGATIVE 12/05/2015 2053   PROTEINUR NEGATIVE 12/05/2015 2053   UROBILINOGEN 0.2 11/12/2014 2124   NITRITE NEGATIVE 12/05/2015 2053   LEUKOCYTESUR NEGATIVE 12/05/2015 2053   LEUKOCYTESUR Negative 02/27/2014 0351   Sepsis Labs: @LABRCNTIP (procalcitonin:4,lacticidven:4) ) Recent Results (from the past 240 hour(s))  Culture, blood (routine x 2)     Status: Abnormal (Preliminary result)   Collection Time: 12/04/15  7:20 PM  Result Value Ref Range Status   Specimen Description BLOOD LEFT ANTECUBITAL  Final   Special Requests BOTTLES DRAWN AEROBIC AND ANAEROBIC 5 CC  Final   Culture   Setup Time   Final    GRAM POSITIVE COCCI IN CLUSTERS IN BOTH AEROBIC AND ANAEROBIC BOTTLES CRITICAL RESULT CALLED TO, READ BACK BY AND VERIFIED WITH: P CLARK,RN AT [id number] 12/05/15 BY L BENFIELD Performed at [facility] (A)  Final   Report Status PENDING  Incomplete  Blood Culture ID Panel (Reflexed)     Status: Abnormal   Collection Time: 12/04/15  7:20 PM  Result Value Ref Range Status   Enterococcus species NOT DETECTED NOT DETECTED Final   Listeria monocytogenes NOT DETECTED NOT DETECTED Final   Staphylococcus species DETECTED (A) NOT DETECTED Final    Comment: CRITICAL RESULT CALLED TO, READ BACK BY AND VERIFIED WITH: P CLARK,RN AT [id number] 12/05/15 BY L BENFIELD    Staphylococcus aureus DETECTED (A) NOT DETECTED Final    Comment: CRITICAL RESULT CALLED TO, READ BACK BY AND VERIFIED WITH: P CLARK,RN AT  0928 12/05/15 BY L BENFIELD    Methicillin resistance NOT DETECTED NOT DETECTED Final   Streptococcus species NOT DETECTED NOT DETECTED Final   Streptococcus agalactiae NOT DETECTED NOT DETECTED Final   Streptococcus pneumoniae NOT DETECTED NOT DETECTED Final   Streptococcus pyogenes NOT DETECTED NOT DETECTED Final   Acinetobacter baumannii NOT DETECTED NOT DETECTED Final   Enterobacteriaceae species NOT DETECTED NOT DETECTED Final   Enterobacter cloacae complex NOT DETECTED NOT DETECTED Final   Escherichia coli NOT DETECTED NOT DETECTED Final   Klebsiella oxytoca NOT DETECTED NOT DETECTED Final   Klebsiella pneumoniae NOT DETECTED NOT DETECTED Final   Proteus species NOT DETECTED NOT DETECTED Final   Serratia marcescens NOT DETECTED NOT DETECTED Final   Haemophilus influenzae NOT DETECTED NOT DETECTED Final   Neisseria meningitidis NOT DETECTED NOT DETECTED Final   Pseudomonas aeruginosa NOT DETECTED NOT DETECTED Final   Candida albicans NOT DETECTED NOT DETECTED Final   Candida glabrata NOT DETECTED NOT DETECTED Final   Candida krusei NOT  DETECTED NOT DETECTED Final   Candida parapsilosis NOT DETECTED NOT DETECTED Final   Candida tropicalis NOT DETECTED NOT DETECTED Final    Comment: Performed at [facility]  Culture, blood (routine x 2)     Status: Abnormal (Preliminary result)   Collection Time: 12/04/15  8:40 PM  Result Value Ref Range Status   Specimen Description BLOOD RIGHT HAND  Final   Special Requests BOTTLES DRAWN AEROBIC AND ANAEROBIC 5CC  Final   Culture  Setup Time   Final    GRAM POSITIVE COCCI IN CLUSTERS IN BOTH AEROBIC AND ANAEROBIC BOTTLES CRITICAL RESULT CALLED TO, READ BACK BY AND VERIFIED WITH: P CLARK,RN AT [id number] 12/05/15 BY L BENFIELD Performed at [facility] (A)  Final   Report Status PENDING  Incomplete  MRSA PCR Screening     Status: None   Collection Time: 12/05/15  3:38 PM  Result Value Ref Range Status   MRSA by PCR NEGATIVE NEGATIVE Final    Comment:        The GeneXpert MRSA Assay (FDA approved for NASAL specimens only), is one component of a comprehensive MRSA colonization surveillance program. It is not intended to diagnose MRSA infection nor to guide or monitor treatment for MRSA infections.      Scheduled Meds: . vancomycin  750 mg Intravenous Q8H   Continuous Infusions:   Procedures/Studies: Dg Chest 2 View  Result Date: 12/04/2015 CLINICAL DATA:  Left-sided chest pain, shortness of breath, former smoking history EXAM: CHEST  2 VIEW COMPARISON:  Chest x-ray of 02/22/2015 FINDINGS: There are vague parenchymal opacities scattered throughout the right lung. In this young patient an infectious or inflammatory process would seem most likely. No definite opacity is seen throughout the left lung. There are somewhat prominent perihilar markings better seen on the lateral view which may indicate bronchitis. Mediastinal and hilar contours are unremarkable with no evidence of hilar or adenopathy. The heart is within normal limits in size.  No bony abnormality is seen. IMPRESSION: Patchy parenchymal opacities throughout the right lung most likely inflammatory or infectious in etiology. Recommend followup. No adenopathy. Also question bronchitis. Electronically Signed   By: Ivar Drape M.D.   On: 12/04/2015 14:29    Dawnya Grams, DO  [facility] Pager [phone]  If 7PM-7AM, please contact night-coverage [email or web address] Password [email or web address] 12/06/2015, 10:45 AM   LOS: 1 day

## 2015-12-06 NOTE — Progress Notes (Signed)
    [facility] has been requested to perform a transesophageal echocardiogram on Erik [name] for bacteremia.  After careful review of history and examination, the risks and benefits of transesophageal echocardiogram have been explained including risks of esophageal damage, perforation (1:10,000 risk), bleeding, pharyngeal hematoma as well as other potential complications associated with conscious sedation including aspiration, arrhythmia, respiratory failure and death. Alternatives to treatment were discussed, questions were answered. Patient is willing to proceed. NPO on midnight on Sunday. To be scheduled Monday AM.   Angelena Form, PA-C  12/06/2015 12:05 PM

## 2015-12-07 ENCOUNTER — Inpatient Hospital Stay (HOSPITAL_COMMUNITY): Payer: 59

## 2015-12-07 DIAGNOSIS — E871 Hypo-osmolality and hyponatremia: Secondary | ICD-10-CM | POA: Diagnosis not present

## 2015-12-07 DIAGNOSIS — F1320 Sedative, hypnotic or anxiolytic dependence, uncomplicated: Secondary | ICD-10-CM

## 2015-12-07 DIAGNOSIS — I071 Rheumatic tricuspid insufficiency: Secondary | ICD-10-CM | POA: Diagnosis not present

## 2015-12-07 DIAGNOSIS — I2690 Septic pulmonary embolism without acute cor pulmonale: Secondary | ICD-10-CM | POA: Diagnosis not present

## 2015-12-07 DIAGNOSIS — B9561 Methicillin susceptible Staphylococcus aureus infection as the cause of diseases classified elsewhere: Secondary | ICD-10-CM | POA: Diagnosis not present

## 2015-12-07 DIAGNOSIS — E876 Hypokalemia: Secondary | ICD-10-CM

## 2015-12-07 DIAGNOSIS — B181 Chronic viral hepatitis B without delta-agent: Secondary | ICD-10-CM | POA: Diagnosis not present

## 2015-12-07 DIAGNOSIS — R0600 Dyspnea, unspecified: Secondary | ICD-10-CM

## 2015-12-07 DIAGNOSIS — D6959 Other secondary thrombocytopenia: Secondary | ICD-10-CM | POA: Diagnosis not present

## 2015-12-07 DIAGNOSIS — A4901 Methicillin susceptible Staphylococcus aureus infection, unspecified site: Secondary | ICD-10-CM

## 2015-12-07 DIAGNOSIS — I330 Acute and subacute infective endocarditis: Secondary | ICD-10-CM | POA: Diagnosis not present

## 2015-12-07 DIAGNOSIS — F1910 Other psychoactive substance abuse, uncomplicated: Secondary | ICD-10-CM | POA: Diagnosis not present

## 2015-12-07 HISTORY — DX: Septic pulmonary embolism without acute cor pulmonale: I26.90

## 2015-12-07 LAB — CBC
HEMATOCRIT: 37.6 % — AB (ref 39.0–52.0)
HEMOGLOBIN: 12.6 g/dL — AB (ref 13.0–17.0)
MCH: 27.3 pg (ref 26.0–34.0)
MCHC: 33.5 g/dL (ref 30.0–36.0)
MCV: 81.6 fL (ref 78.0–100.0)
Platelets: 158 10*3/uL (ref 150–400)
RBC: 4.61 MIL/uL (ref 4.22–5.81)
RDW: 12.9 % (ref 11.5–15.5)
WBC: 12.9 10*3/uL — ABNORMAL HIGH (ref 4.0–10.5)

## 2015-12-07 LAB — VITAMIN B12: VITAMIN B 12: 392 pg/mL (ref 180–914)

## 2015-12-07 LAB — CULTURE, BLOOD (ROUTINE X 2)

## 2015-12-07 LAB — VANCOMYCIN, TROUGH: Vancomycin Tr: 6 ug/mL — ABNORMAL LOW (ref 15–20)

## 2015-12-07 LAB — BASIC METABOLIC PANEL
ANION GAP: 9 (ref 5–15)
BUN: 8 mg/dL (ref 6–20)
CALCIUM: 8.2 mg/dL — AB (ref 8.9–10.3)
CHLORIDE: 103 mmol/L (ref 101–111)
CO2: 24 mmol/L (ref 22–32)
Creatinine, Ser: 0.6 mg/dL — ABNORMAL LOW (ref 0.61–1.24)
GFR calc non Af Amer: >60 mL/min (ref >60)
GLUCOSE: 109 mg/dL — AB (ref 65–99)
Potassium: 3.3 mmol/L — ABNORMAL LOW (ref 3.5–5.1)
Sodium: 136 mmol/L (ref 135–145)

## 2015-12-07 LAB — ECHOCARDIOGRAM COMPLETE
HEIGHTINCHES: 66 in
WEIGHTICAEL: 2070.56 [oz_av]

## 2015-12-07 MED ORDER — POTASSIUM CHLORIDE CRYS ER 20 MEQ PO TBCR
40.0000 meq | EXTENDED_RELEASE_TABLET | Freq: Once | ORAL | Status: AC
  Administered 2015-12-07: 40 meq via ORAL
  Filled 2015-12-07: qty 2

## 2015-12-07 MED ORDER — SODIUM CHLORIDE 0.9 % IV SOLN
1750.0000 mg | Freq: Three times a day (TID) | INTRAVENOUS | Status: DC
  Filled 2015-12-07 (×2): qty 1750

## 2015-12-07 MED ORDER — HYDROCODONE-ACETAMINOPHEN 5-325 MG PO TABS
1.0000 | ORAL_TABLET | Freq: Four times a day (QID) | ORAL | Status: DC | PRN
  Administered 2015-12-07 – 2015-12-16 (×28): 1 via ORAL
  Filled 2015-12-07 (×32): qty 1

## 2015-12-07 MED ORDER — VANCOMYCIN HCL IN DEXTROSE 1-5 GM/200ML-% IV SOLN
1000.0000 mg | Freq: Four times a day (QID) | INTRAVENOUS | Status: DC
  Administered 2015-12-07 – 2015-12-08 (×5): 1000 mg via INTRAVENOUS
  Filled 2015-12-07 (×9): qty 200

## 2015-12-07 NOTE — Progress Notes (Addendum)
Pharmacy Antibiotic Note  Erik [name] is a 20 y.o. male admitted on 12/05/2015 with MSSA bacteremia.  Pharmacy consulted for vancomycin (anaphylaxis to PCN).  -Very young with good renal function, SCr 0.6 and stable -Of note, had a history of AKI in 11/2014, but renal function looks good this admission  -10/1 VT = 6: Dose increased to 1000mg  Q6H  Plan: - Increase vanc to 1000 Q6H (estimated trough ~14) - Vanc trough at 0930 on 10/2 - Monitor renal fxn, C&S, clinical progress  Height: 5\' 6"  (167.6 cm) Weight: 129 lb 6.6 oz (58.7 kg) IBW/kg (Calculated) : 63.8  Temp (24hrs), Avg:98.8 F (37.1 C), Min:98.5 F (36.9 C), Max:99.2 F (37.3 C)   Recent Labs Lab 12/04/15 1309 12/05/15 1600 12/05/15 1844 12/05/15 2336 12/06/15 0226 12/07/15 0757  WBC 10.1 13.6*  --   --  9.2 12.9*  CREATININE 0.75 0.74  --   --  0.71 0.60*  LATICACIDVEN  --   --  3.0* 1.3 1.9  --   VANCOTROUGH  --   --   --   --   --  6*    Estimated Creatinine Clearance: 120.3 mL/min (by C-G formula based on SCr of 0.6 mg/dL (L)).    Allergies  Allergen Reactions  . Penicillins Anaphylaxis and Swelling    All "-cillins."  Angioedema also  . Sulfa Antibiotics Swelling and Rash    Childhood allergic reaction    Antimicrobials this admission:  Vanc 9/29 >>  Dose adjustments this admission:  -10/1 VT = 6: Dose increased to 1000mg  Q6H  Microbiology results:  -9/28 BCx x4 - GPC (BCID Staph aureus) -MRSA PCR negative  Myer Peer Grayland Ormond), PharmD  [id number] Pharmacy Resident Pager: [phone] 12/07/2015 10:00 AM

## 2015-12-07 NOTE — Progress Notes (Signed)
Echocardiogram 2D Echocardiogram has been performed.  Erik [name] 12/07/2015, 5:07 PM

## 2015-12-07 NOTE — Progress Notes (Signed)
Lab reported to nurse that ECHO showed vegetation on Tricuspid Valve. Made Dr Tat aware.

## 2015-12-07 NOTE — Progress Notes (Signed)
PROGRESS NOTE  Erik [name] [id number] DOB: October 27, 1995 DOA: 12/05/2015 PCP: Gwendolyn Grant, MD  Brief History:  20 year old male with a history of intravenous drug use, anxiety, endocarditis, chronic hepatitis B presented after being informed of positive blood cultures that was obtained on 12/04/2015. Preliminary results show Staphylococcus aureus in 2 out of 2 sets. The patient initially presented to the emergency department on 12/04/2015 with chest pain and shortness of breath. The patient was given a dose of levofloxacin and discharged from the emergency department after blood cultures were obtained.  The patient states that he last used heroin approximately 2 weeks prior to this admission.   during his admission from 02/22/2015 through 02/26/2015,, the patient was admitted for treatment of rhabdomyolysis, cellulitis of bilateral feet, and elevated troponins. The patient also has had a previous admission back in September 2016 for narcotic induced respiratory failure requiring intubation. In addition, the patient has had a number of previous admissions in the past for narcotic and other illicit drug overdose. The patient was started on intravenous vancomycin and admitted for further evaluation of his bacteremia.  Assessment/Plan: MSSA bacteremia -Etiology likely to be intravenous drug use -Infectious disease will be consulted per protocol -Surveillance blood cultures -Already requested TEE--will be done 10/1 or 10/2 -pt has anaphylaxis to PCN--->continue vancomycin  Pulmonary opacities/septic emboli -Secondary to  metastatic infection -CT angio chest---patchy consolidations peripheral bilateral lungs largest in RLL including cavitary consolidation right apex x 2  Hyponatremia -Secondary to volume depletion -Improved with fluid resuscitation  Thrombocytopenia -Repeat HIV--pending -Likely due to chronic liver disease secondary to chronic hepatitis  B -B12--392 -screen hep C -improving with treatment of infection  Chronic hepatitis B infection without coma -Appears clinically compensated at this time -Outpatient follow-up -screen hep C & AFP  Chest pain -minimally elevated troponin likely vasospasm/demand ischemia -02/22/2015--heart catheterization--normal coronaries -personally reviewed EKG--sinus rhythm, nonspecific T-wave changes -Personally reviewed CXR--vague parenchymal opacities in the right lung -CTA chest--patchy consolidations peripheral bilateral lungs largest in RLL including cavitary consolidation right apex x 2; No PE -norco for pain, will not be giving IV opioids  Hypokalemia -replete -check mag     Disposition Plan:   Not stable for discharge.  Anticipate several days/weeks Family Communication:   No Family at bedside--Total time spent 35 minutes.  Greater than 50% spent face to face counseling and coordinating care.   Consultants:  ID, Cardiology for TEE  Code Status:  FULL   DVT Prophylaxis:  SCDs   Procedures: As Listed in Progress Note Above  Antibiotics: None    Subjective: Patient complains of sharp stabbing left sided chest pain worse when he lays down but also occurring when he sits up. Denies any shortness of breath but it is pleuritic. Denies any nausea, vomiting, diarrhea, abdominal pain. No dysuria or hematuria. No rashes. Denies any synovitis.  Objective: Vitals:   12/06/15 1405 12/06/15 2317 12/07/15 0439 12/07/15 1315  BP: (!) 101/49 (!) 106/54 (!) 100/56 108/67  Pulse: 90 92 96 90  Resp: 20 16 16 16   Temp: 98.5 F (36.9 C) 98.8 F (37.1 C) 99.2 F (37.3 C) 98.8 F (37.1 C)  TempSrc: Oral Oral Oral Oral  SpO2: 99% 100% 97% 100%  Weight:      Height:        Intake/Output Summary (Last 24 hours) at 12/07/15 1525 Last data filed at 12/07/15 0728  Gross per 24 hour  Intake  532.5 ml  Output             1175 ml  Net           -642.5 ml   Weight  change:  Exam:   General:  Pt is alert, follows commands appropriately, not in acute distress  HEENT: No icterus, No thrush, No neck mass, [place]/AT  Cardiovascular: RRR, S1/S2, no rubs, no gallops  Respiratory: Fine bibasilar crackles. No wheeze. Good air movement.  Abdomen: Soft/+BS, non tender, non distended, no guarding  Extremities: No edema, No lymphangitis, No petechiae, No rashes, no synovitis   Data Reviewed: I have personally reviewed following labs and imaging studies Basic Metabolic Panel:  Recent Labs Lab 12/04/15 1309 12/05/15 1600 12/06/15 0226 12/07/15 0757  NA 127* 129* 131* 136  K 3.6 3.7 3.9 3.3*  CL 92* 92* 100* 103  CO2 25 25 23 24   GLUCOSE 149* 171* 111* 109*  BUN 10 11 12 8   CREATININE 0.75 0.74 0.71 0.60*  CALCIUM 8.4* 8.6* 8.3* 8.2*   Liver Function Tests:  Recent Labs Lab 12/04/15 1309 12/05/15 1600  AST 28 26  ALT 23 23  ALKPHOS 75 79  BILITOT 0.8 0.5  PROT 7.4 6.8  ALBUMIN 3.3* 2.8*    Recent Labs Lab 12/04/15 1309  LIPASE 17   No results for input(s): AMMONIA in the last 168 hours. Coagulation Profile: No results for input(s): INR, PROTIME in the last 168 hours. CBC:  Recent Labs Lab 12/04/15 1309 12/05/15 1600 12/06/15 0226 12/07/15 0757  WBC 10.1 13.6* 9.2 12.9*  NEUTROABS  --  10.6*  --   --   HGB 14.0 13.3 14.0 12.6*  HCT 40.5 38.4* 40.9 37.6*  MCV 80.0 80.5 82.5 81.6  PLT 77* 92* 86* 158   Cardiac Enzymes:  Recent Labs Lab 12/04/15 1309 12/05/15 1828 12/05/15 2336  CKTOTAL 12*  --   --   TROPONINI  --  0.03* <0.03   BNP: Invalid input(s): POCBNP CBG: No results for input(s): GLUCAP in the last 168 hours. HbA1C:  Recent Labs  12/05/15 0530  HGBA1C 5.5   Urine analysis:    Component Value Date/Time   COLORURINE YELLOW 12/05/2015 2053   APPEARANCEUR CLOUDY (A) 12/05/2015 2053   APPEARANCEUR Clear 02/27/2014 0351   LABSPEC 1.019 12/05/2015 2053   LABSPEC 1.025 02/27/2014 0351   PHURINE 6.5  12/05/2015 2053   GLUCOSEU NEGATIVE 12/05/2015 2053   GLUCOSEU Negative 02/27/2014 0351   HGBUR NEGATIVE 12/05/2015 2053   BILIRUBINUR NEGATIVE 12/05/2015 2053   BILIRUBINUR Negative 02/27/2014 0351   KETONESUR NEGATIVE 12/05/2015 2053   PROTEINUR NEGATIVE 12/05/2015 2053   UROBILINOGEN 0.2 11/12/2014 2124   NITRITE NEGATIVE 12/05/2015 2053   LEUKOCYTESUR NEGATIVE 12/05/2015 2053   LEUKOCYTESUR Negative 02/27/2014 0351   Sepsis Labs: @LABRCNTIP (procalcitonin:4,lacticidven:4) ) Recent Results (from the past 240 hour(s))  Culture, blood (routine x 2)     Status: Abnormal   Collection Time: 12/04/15  7:20 PM  Result Value Ref Range Status   Specimen Description BLOOD LEFT ANTECUBITAL  Final   Special Requests BOTTLES DRAWN AEROBIC AND ANAEROBIC 5 CC  Final   Culture  Setup Time   Final    GRAM POSITIVE COCCI IN CLUSTERS IN BOTH AEROBIC AND ANAEROBIC BOTTLES CRITICAL RESULT CALLED TO, READ BACK BY AND VERIFIED WITH: P CLARK,RN AT [id number] 12/05/15 BY L BENFIELD Performed at [facility] (A)  Final   Report Status 12/07/2015 FINAL  Final   Organism ID, Bacteria STAPHYLOCOCCUS AUREUS  Final      Susceptibility   Staphylococcus aureus - MIC*    CIPROFLOXACIN <=0.5 SENSITIVE Sensitive     ERYTHROMYCIN >=8 RESISTANT Resistant     GENTAMICIN <=0.5 SENSITIVE Sensitive     OXACILLIN <=0.25 SENSITIVE Sensitive     TETRACYCLINE <=1 SENSITIVE Sensitive     VANCOMYCIN 1 SENSITIVE Sensitive     TRIMETH/SULFA <=10 SENSITIVE Sensitive     CLINDAMYCIN <=0.25 SENSITIVE Sensitive     RIFAMPIN <=0.5 SENSITIVE Sensitive     Inducible Clindamycin NEGATIVE Sensitive     * STAPHYLOCOCCUS AUREUS  Blood Culture ID Panel (Reflexed)     Status: Abnormal   Collection Time: 12/04/15  7:20 PM  Result Value Ref Range Status   Enterococcus species NOT DETECTED NOT DETECTED Final   Listeria monocytogenes NOT DETECTED NOT DETECTED Final   Staphylococcus species DETECTED  (A) NOT DETECTED Final    Comment: CRITICAL RESULT CALLED TO, READ BACK BY AND VERIFIED WITH: P CLARK,RN AT [id number] 12/05/15 BY L BENFIELD    Staphylococcus aureus DETECTED (A) NOT DETECTED Final    Comment: CRITICAL RESULT CALLED TO, READ BACK BY AND VERIFIED WITH: P CLARK,RN AT [id number] 12/05/15 BY L BENFIELD    Methicillin resistance NOT DETECTED NOT DETECTED Final   Streptococcus species NOT DETECTED NOT DETECTED Final   Streptococcus agalactiae NOT DETECTED NOT DETECTED Final   Streptococcus pneumoniae NOT DETECTED NOT DETECTED Final   Streptococcus pyogenes NOT DETECTED NOT DETECTED Final   Acinetobacter baumannii NOT DETECTED NOT DETECTED Final   Enterobacteriaceae species NOT DETECTED NOT DETECTED Final   Enterobacter cloacae complex NOT DETECTED NOT DETECTED Final   Escherichia coli NOT DETECTED NOT DETECTED Final   Klebsiella oxytoca NOT DETECTED NOT DETECTED Final   Klebsiella pneumoniae NOT DETECTED NOT DETECTED Final   Proteus species NOT DETECTED NOT DETECTED Final   Serratia marcescens NOT DETECTED NOT DETECTED Final   Haemophilus influenzae NOT DETECTED NOT DETECTED Final   Neisseria meningitidis NOT DETECTED NOT DETECTED Final   Pseudomonas aeruginosa NOT DETECTED NOT DETECTED Final   Candida albicans NOT DETECTED NOT DETECTED Final   Candida glabrata NOT DETECTED NOT DETECTED Final   Candida krusei NOT DETECTED NOT DETECTED Final   Candida parapsilosis NOT DETECTED NOT DETECTED Final   Candida tropicalis NOT DETECTED NOT DETECTED Final    Comment: Performed at [facility]  Culture, blood (routine x 2)     Status: Abnormal   Collection Time: 12/04/15  8:40 PM  Result Value Ref Range Status   Specimen Description BLOOD RIGHT HAND  Final   Special Requests BOTTLES DRAWN AEROBIC AND ANAEROBIC 5CC  Final   Culture  Setup Time   Final    GRAM POSITIVE COCCI IN CLUSTERS IN BOTH AEROBIC AND ANAEROBIC BOTTLES CRITICAL RESULT CALLED TO, READ BACK BY AND VERIFIED WITH: P  CLARK,RN AT [id number] 12/05/15 BY L BENFIELD    Culture (A)  Final    STAPHYLOCOCCUS AUREUS SUSCEPTIBILITIES PERFORMED ON PREVIOUS CULTURE WITHIN THE LAST 5 DAYS. Performed at [facility]    Report Status 12/07/2015 FINAL  Final  MRSA PCR Screening     Status: None   Collection Time: 12/05/15  3:38 PM  Result Value Ref Range Status   MRSA by PCR NEGATIVE NEGATIVE Final    Comment:        The GeneXpert MRSA Assay (FDA approved for NASAL specimens only), is one component of a comprehensive  MRSA colonization surveillance program. It is not intended to diagnose MRSA infection nor to guide or monitor treatment for MRSA infections.      Scheduled Meds: . vancomycin  1,000 mg Intravenous Q6H   Continuous Infusions: . sodium chloride 75 mL/hr at 12/07/15 [id number]    Procedures/Studies: Dg Chest 2 View  Result Date: 12/04/2015 CLINICAL DATA:  Left-sided chest pain, shortness of breath, former smoking history EXAM: CHEST  2 VIEW COMPARISON:  Chest x-ray of 02/22/2015 FINDINGS: There are vague parenchymal opacities scattered throughout the right lung. In this young patient an infectious or inflammatory process would seem most likely. No definite opacity is seen throughout the left lung. There are somewhat prominent perihilar markings better seen on the lateral view which may indicate bronchitis. Mediastinal and hilar contours are unremarkable with no evidence of hilar or adenopathy. The heart is within normal limits in size. No bony abnormality is seen. IMPRESSION: Patchy parenchymal opacities throughout the right lung most likely inflammatory or infectious in etiology. Recommend followup. No adenopathy. Also question bronchitis. Electronically Signed   By: Ivar Drape M.D.   On: 12/04/2015 14:29   Ct Angio Chest Pe W Or Wo Contrast  Result Date: 12/06/2015 CLINICAL DATA:  Left-sided chest pain for 2-3 days. Staph aureus bacteremia with chest pain. Clinical suspicion of septic emboli.  Additional history of drug abuse/dependence, IV drug abuser, history of endocarditis, hepatitis B. EXAM: CT ANGIOGRAPHY CHEST WITH CONTRAST TECHNIQUE: Multidetector CT imaging of the chest was performed using the standard protocol during bolus administration of intravenous contrast. Multiplanar CT image reconstructions and MIPs were obtained to evaluate the vascular anatomy. CONTRAST:  100 cc Isovue 370 COMPARISON:  Chest x-ray dated 12/04/2015. FINDINGS: Cardiovascular: The majority of the most peripheral segmental and subsegmental pulmonary arteries cannot be definitively characterized due to patient breathing motion artifact but there is no pulmonary embolism identified within the main, lobar or central segmental pulmonary arteries bilaterally. Heart size is normal. No pericardial effusion. Thoracic aorta is normal. No aortic aneurysm or dissection. Mediastinum/Nodes: Scattered small lymph nodes within the mediastinum, perihilar and axillary regions, likely reactive in nature. Lungs/Pleura: Patchy nodular consolidations are seen throughout the periphery of both lungs, largest in the right lower lobe measuring 2.3 cm greatest dimension, including a cavitary consolidation at the right lung apex laterally which measures 1 9 cm and an additional cavitary consolidation in the right lung apex anteriorly which measures 1.2 cm. Larger denser consolidation at the left lung base is likely atelectasis, less likely pneumonia. Additional patchy atelectasis noted at the right lung base. Small left pleural effusion. No pneumothorax. Upper Abdomen: Limited images of the upper abdomen are unremarkable. Musculoskeletal: No chest wall abnormality. No acute or significant osseous findings. Review of the MIP images confirms the above findings. IMPRESSION: 1. Numerous nodular consolidations throughout the periphery of both lungs, some of which have central cavitations, largest in the right lower lobe measuring 2.3 cm greatest  dimension. Differential includes atypical infection (such as fungal pneumonia) and septic emboli. Favor septic emboli. 2. Additional denser consolidation at the left lung base is most likely atelectasis, less likely pneumonia. 3. Small left pleural effusion. 4. No pulmonary embolism, with mild study limitations detailed above. 5. Heart size is upper normal.  No pericardial effusion. Electronically Signed   By: Franki Cabot M.D.   On: 12/06/2015 18:51    Deovion Batrez, DO  [facility] Pager [phone]  If 7PM-7AM, please contact night-coverage [email or web address] Password [email or web address] 12/07/2015, 3:25 PM   LOS: 2  days

## 2015-12-08 DIAGNOSIS — B181 Chronic viral hepatitis B without delta-agent: Secondary | ICD-10-CM

## 2015-12-08 DIAGNOSIS — K0889 Other specified disorders of teeth and supporting structures: Secondary | ICD-10-CM

## 2015-12-08 DIAGNOSIS — F1910 Other psychoactive substance abuse, uncomplicated: Secondary | ICD-10-CM

## 2015-12-08 DIAGNOSIS — B9561 Methicillin susceptible Staphylococcus aureus infection as the cause of diseases classified elsewhere: Secondary | ICD-10-CM

## 2015-12-08 DIAGNOSIS — I330 Acute and subacute infective endocarditis: Principal | ICD-10-CM

## 2015-12-08 DIAGNOSIS — I2690 Septic pulmonary embolism without acute cor pulmonale: Secondary | ICD-10-CM

## 2015-12-08 LAB — BLOOD CULTURE ID PANEL (REFLEXED)
ACINETOBACTER BAUMANNII: NOT DETECTED
CANDIDA KRUSEI: NOT DETECTED
CANDIDA TROPICALIS: NOT DETECTED
Candida albicans: NOT DETECTED
Candida glabrata: NOT DETECTED
Candida parapsilosis: NOT DETECTED
ENTEROBACTER CLOACAE COMPLEX: NOT DETECTED
ENTEROBACTERIACEAE SPECIES: NOT DETECTED
ENTEROCOCCUS SPECIES: NOT DETECTED
ESCHERICHIA COLI: NOT DETECTED
Haemophilus influenzae: NOT DETECTED
Klebsiella oxytoca: NOT DETECTED
Klebsiella pneumoniae: NOT DETECTED
LISTERIA MONOCYTOGENES: NOT DETECTED
Methicillin resistance: NOT DETECTED
Neisseria meningitidis: NOT DETECTED
PSEUDOMONAS AERUGINOSA: NOT DETECTED
Proteus species: NOT DETECTED
STAPHYLOCOCCUS AUREUS BCID: DETECTED — AB
STAPHYLOCOCCUS SPECIES: DETECTED — AB
STREPTOCOCCUS AGALACTIAE: NOT DETECTED
STREPTOCOCCUS SPECIES: NOT DETECTED
Serratia marcescens: NOT DETECTED
Streptococcus pneumoniae: NOT DETECTED
Streptococcus pyogenes: NOT DETECTED

## 2015-12-08 LAB — AFP TUMOR MARKER: AFP TUMOR MARKER: 1.4 ng/mL (ref 0.0–8.3)

## 2015-12-08 LAB — CBC
HEMATOCRIT: 45.3 % (ref 39.0–52.0)
HEMOGLOBIN: 15.1 g/dL (ref 13.0–17.0)
MCH: 27.7 pg (ref 26.0–34.0)
MCHC: 33.3 g/dL (ref 30.0–36.0)
MCV: 83.1 fL (ref 78.0–100.0)
Platelets: 207 10*3/uL (ref 150–400)
RBC: 5.45 MIL/uL (ref 4.22–5.81)
RDW: 12.9 % (ref 11.5–15.5)
WBC: 12.7 10*3/uL — AB (ref 4.0–10.5)

## 2015-12-08 LAB — HIV ANTIBODY (ROUTINE TESTING W REFLEX): HIV Screen 4th Generation wRfx: NONREACTIVE

## 2015-12-08 LAB — VANCOMYCIN, TROUGH: Vancomycin Tr: 14 ug/mL — ABNORMAL LOW (ref 15–20)

## 2015-12-08 LAB — HEPATITIS C ANTIBODY: HCV Ab: 0.1 s/co ratio (ref 0.0–0.9)

## 2015-12-08 LAB — MAGNESIUM: MAGNESIUM: 2.1 mg/dL (ref 1.7–2.4)

## 2015-12-08 MED ORDER — VANCOMYCIN HCL 10 G IV SOLR
1500.0000 mg | Freq: Three times a day (TID) | INTRAVENOUS | Status: DC
  Administered 2015-12-08 – 2015-12-10 (×5): 1500 mg via INTRAVENOUS
  Filled 2015-12-08 (×8): qty 1500

## 2015-12-08 NOTE — Consult Note (Signed)
   [facility] CM Inpatient Consult   12/08/2015  RACHEL MUNETON 08-17-1995 [id number]    Came to visit patient at bedside on behalf of [facility] to [facility] Care Management program for [facility] employees/dependents with [facility] insurance. Denies any current Link to Wellness needs. Provided Link to [employer] and contact information. Discussed that he will receive post hospital follow up call. Confirmed best contact number as [phone]. Will make inpatient RNCM aware of visit.   Marthenia Rolling, MSN-Ed, RN,BSN [facility] Liaison [phone]

## 2015-12-08 NOTE — Consult Note (Signed)
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 Carles Collet and Terrilyn Saver "auto-consult"   HPI: Erik [name] is an 20 y.o. 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 I've been alerted to this via the [facility] system and pharmacy had tried to get ahold of him but were not successful. Rarely someone was successful in getting hold of him ultimately and he was admitted to the hospital on September 29 the following day. I do not know that he was in the hospital however myself until today when Legrand Como from pharmacy alerted me.  His repeat blood cultures are still growing methicillin sensitive Staphylococcus aureus and his echocardiogram shows a 2 cm vegetation on the tricuspid valve. He had a CT angiogram that showed numerous lesions consistent with with septic emboli to the lungs. Other than chest pain and fevers he has no other localizing symptoms. He admits to injecting IV drugs though he claims that he tries to use clean equipment and to sterilize his skin as best as he can claims to be very meticulous with this. I informed her that no matter how meticulously was that getting bloodstream infections or injection drug use was inevitable.   Past Medical History:  Diagnosis Date  . Anxiety   . Drug abuse and dependence ([place])    admit with OD 11/2014: rhabdo and AKI, ARF -   . Hepatitis B    chronic viral/hx per notes 12/05/2015  . History of endocarditis    LESCHBER, Clifton A [[id number]]  . IV drug user    heroine  . NSTEMI (non-ST elevated myocardial infarction) ([place])    Archie Endo 12/05/2015  . Pneumonia 12/04/2015  . Sinusitis     Past Surgical History:  Procedure Laterality Date  . CARDIAC CATHETERIZATION N/A 02/22/2015   Procedure: Left Heart Cath and Coronary Angiography;  Surgeon: Troy Sine, MD;  Location: [place] [facility];  Service: Cardiovascular;  Laterality: N/A;  . TEE WITHOUT CARDIOVERSION N/A 02/24/2015   Procedure: TRANSESOPHAGEAL ECHOCARDIOGRAM (TEE);  Surgeon: Skeet Latch, MD;  Location: [facility] ENDOSCOPY;  Service: Cardiovascular;  Laterality: N/A;    Social History:  reports that he has been smoking Cigarettes.  He has been smoking about 1.00 pack per day. He has never used smokeless tobacco. He reports that he drinks alcohol. He reports that he uses drugs, including Cocaine, Marijuana, and IV.   Family History  Problem Relation Age of Onset  . Adopted: Yes    Allergies  Allergen Reactions  . Penicillins Anaphylaxis and Swelling    All "-cillins."  Angioedema also  . Sulfa Antibiotics Swelling and Rash    Childhood allergic reaction     Medications: I have reviewed patients current medications as documented in Epic Anti-infectives    Start     Dose/Rate Route Frequency Ordered Stop   12/07/15 1000  vancomycin (VANCOCIN) 1,750 mg in sodium chloride 0.9 % 500 mL IVPB  Status:  Discontinued     1,750 mg 250 mL/hr over 120 Minutes Intravenous Every 8 hours 12/07/15 0947 12/07/15 0957   12/07/15 1000  vancomycin (VANCOCIN) IVPB 1000 mg/200 mL  premix     1,000 mg 200 mL/hr over 60 Minutes Intravenous Every 6 hours 12/07/15 0958     12/06/15 0100  vancomycin (VANCOCIN) IVPB 750 mg/150 ml premix  Status:  Discontinued     750 mg 150 mL/hr over 60 Minutes Intravenous Every 8 hours 12/05/15 1605 12/07/15 0947   12/05/15 1615  vancomycin (VANCOCIN) IVPB 1000 mg/200 mL premix     1,000 mg 200 mL/hr over 60 Minutes Intravenous  Once 12/05/15 1605 12/05/15 2008         ROS:  as in HPI otherwise remainder of 12 point Review of  Systems is negative    Blood pressure 131/73, pulse 73, temperature 98.4 F (36.9 C), resp. rate 18, height 5' 6" (1.676 m), weight 129 lb 6.6 oz (58.7 kg), SpO2 98 %. General: Alert and awake, oriented x3, not in any acute distress. HEENT: anicteric sclera,  Conjunctiva are clear ,EOMI, oropharynx clear and without exudate, he has a cracked molar posteriorly on the right Cardiovascular: tachycardic rate, normal r,  no murmur rubs or gallops Pulmonary: clear to auscultation bilaterally, no wheezing, rales or rhonchi, really having full breaths due to pleuritic chest pain Gastrointestinal: soft nontender, nondistended, normal bowel sounds, Musculoskeletal: no  clubbing or edema noted bilaterally,  Skin, soft tissue: no rashes, tattoos present, or Janeway lesions splitter hemorrhages or Osler's nodes Neuro: nonfocal, strength and sensation intact   Results for orders placed or performed during the hospital encounter of 12/05/15 (from the past 48 hour(s))  Basic metabolic panel     Status: Abnormal   Collection Time: 12/07/15  7:57 AM  Result Value Ref Range   Sodium 136 135 - 145 mmol/L   Potassium 3.3 (L) 3.5 - 5.1 mmol/L   Chloride 103 101 - 111 mmol/L   CO2 24 22 - 32 mmol/L   Glucose, Bld 109 (H) 65 - 99 mg/dL   BUN 8 6 - 20 mg/dL   Creatinine, Ser 0.60 (L) 0.61 - 1.24 mg/dL   Calcium 8.2 (L) 8.9 - 10.3 mg/dL   GFR calc non Af Amer >60 >60 mL/min   GFR calc Af Amer >60 >60 mL/min    Comment: (NOTE) The eGFR has been calculated using the CKD EPI equation. This calculation has not been validated in all clinical situations. eGFR's persistently <60 mL/min signify possible Chronic Kidney Disease.    Anion gap 9 5 - 15  CBC     Status: Abnormal   Collection Time: 12/07/15  7:57 AM  Result Value Ref Range   WBC 12.9 (H) 4.0 - 10.5 K/uL   RBC 4.61 4.22 - 5.81 MIL/uL   Hemoglobin 12.6 (L) 13.0 - 17.0 g/dL   HCT 37.6 (L) 39.0 - 52.0 %   MCV 81.6 78.0 - 100.0 fL   MCH 27.3 26.0 -  34.0 pg   MCHC 33.5 30.0 - 36.0 g/dL   RDW 12.9 11.5 - 15.5 %   Platelets 158 150 - 400 K/uL    Comment: DELTA CHECK NOTED  Vitamin B12     Status: None   Collection Time: 12/07/15  7:57 AM  Result Value Ref Range   Vitamin B-12 392 180 - 914 pg/mL    Comment: (NOTE) This assay is not validated for testing neonatal or myeloproliferative syndrome specimens for Vitamin B12 levels.   Vancomycin, trough     Status: Abnormal   Collection Time: 12/07/15  7:57 AM  Result Value Ref Range   Vancomycin Tr 6 (L) 15 -  20 ug/mL  Culture, blood (Routine X 2) w Reflex to ID Panel     Status: None (Preliminary result)   Collection Time: 12/07/15  5:30 PM  Result Value Ref Range   Specimen Description BLOOD RIGHT HAND    Special Requests IN PEDIATRIC BOTTLE 1CC    Culture  Setup Time      GRAM POSITIVE COCCI IN CLUSTERS IN PEDIATRIC BOTTLE    Culture GRAM POSITIVE COCCI    Report Status PENDING   Culture, blood (Routine X 2) w Reflex to ID Panel     Status: None (Preliminary result)   Collection Time: 12/07/15  5:30 PM  Result Value Ref Range   Specimen Description BLOOD LEFT HAND    Special Requests IN PEDIATRIC BOTTLE 2CC    Culture  Setup Time      GRAM POSITIVE COCCI IN CLUSTERS IN PEDIATRIC BOTTLE Organism ID to follow CRITICAL RESULT CALLED TO, READ BACK BY AND VERIFIED WITH: MVonita Moss.D. 11:20 12/08/15 (wilsonm)    Culture GRAM POSITIVE COCCI    Report Status PENDING   Blood Culture ID Panel (Reflexed)     Status: Abnormal   Collection Time: 12/07/15  5:30 PM  Result Value Ref Range   Enterococcus species NOT DETECTED NOT DETECTED   Listeria monocytogenes NOT DETECTED NOT DETECTED   Staphylococcus species DETECTED (A) NOT DETECTED    Comment: CRITICAL RESULT CALLED TO, READ BACK BY AND VERIFIED WITH: M. Vonita Moss.D. 11:20 12/08/15 (wilsonm)    Staphylococcus aureus DETECTED (A) NOT DETECTED    Comment: CRITICAL RESULT CALLED TO, READ BACK BY AND VERIFIED WITH: M.  Radford Pax Pharm.D. 11:20 12/08/15 (wilsonm)    Methicillin resistance NOT DETECTED NOT DETECTED   Streptococcus species NOT DETECTED NOT DETECTED   Streptococcus agalactiae NOT DETECTED NOT DETECTED   Streptococcus pneumoniae NOT DETECTED NOT DETECTED   Streptococcus pyogenes NOT DETECTED NOT DETECTED   Acinetobacter baumannii NOT DETECTED NOT DETECTED   Enterobacteriaceae species NOT DETECTED NOT DETECTED   Enterobacter cloacae complex NOT DETECTED NOT DETECTED   Escherichia coli NOT DETECTED NOT DETECTED   Klebsiella oxytoca NOT DETECTED NOT DETECTED   Klebsiella pneumoniae NOT DETECTED NOT DETECTED   Proteus species NOT DETECTED NOT DETECTED   Serratia marcescens NOT DETECTED NOT DETECTED   Haemophilus influenzae NOT DETECTED NOT DETECTED   Neisseria meningitidis NOT DETECTED NOT DETECTED   Pseudomonas aeruginosa NOT DETECTED NOT DETECTED   Candida albicans NOT DETECTED NOT DETECTED   Candida glabrata NOT DETECTED NOT DETECTED   Candida krusei NOT DETECTED NOT DETECTED   Candida parapsilosis NOT DETECTED NOT DETECTED   Candida tropicalis NOT DETECTED NOT DETECTED   _0 (sdes,specrequest,cult,reptstatus)   ) Recent Results (from the past 720 hour(s))  Culture, blood (routine x 2)     Status: Abnormal   Collection Time: 12/04/15  7:20 PM  Result Value Ref Range Status   Specimen Description BLOOD LEFT ANTECUBITAL  Final   Special Requests BOTTLES DRAWN AEROBIC AND ANAEROBIC 5 CC  Final   Culture  Setup Time   Final    GRAM POSITIVE COCCI IN CLUSTERS IN BOTH AEROBIC AND ANAEROBIC BOTTLES CRITICAL RESULT CALLED TO, READ BACK BY AND VERIFIED WITH: P CLARK,RN AT 6812 12/05/15 BY L BENFIELD Performed at [place] (A)  Final   Report Status 12/07/2015 FINAL  Final   Organism ID, Bacteria STAPHYLOCOCCUS AUREUS  Final      Susceptibility   Staphylococcus aureus - MIC*  CIPROFLOXACIN <=0.5 SENSITIVE Sensitive     ERYTHROMYCIN >=8  RESISTANT Resistant     GENTAMICIN <=0.5 SENSITIVE Sensitive     OXACILLIN <=0.25 SENSITIVE Sensitive     TETRACYCLINE <=1 SENSITIVE Sensitive     VANCOMYCIN 1 SENSITIVE Sensitive     TRIMETH/SULFA <=10 SENSITIVE Sensitive     CLINDAMYCIN <=0.25 SENSITIVE Sensitive     RIFAMPIN <=0.5 SENSITIVE Sensitive     Inducible Clindamycin NEGATIVE Sensitive     * STAPHYLOCOCCUS AUREUS  Blood Culture ID Panel (Reflexed)     Status: Abnormal   Collection Time: 12/04/15  7:20 PM  Result Value Ref Range Status   Enterococcus species NOT DETECTED NOT DETECTED Final   Listeria monocytogenes NOT DETECTED NOT DETECTED Final   Staphylococcus species DETECTED (A) NOT DETECTED Final    Comment: CRITICAL RESULT CALLED TO, READ BACK BY AND VERIFIED WITH: P CLARK,RN AT 6962 12/05/15 BY L BENFIELD    Staphylococcus aureus DETECTED (A) NOT DETECTED Final    Comment: CRITICAL RESULT CALLED TO, READ BACK BY AND VERIFIED WITH: P CLARK,RN AT 9528 12/05/15 BY L BENFIELD    Methicillin resistance NOT DETECTED NOT DETECTED Final   Streptococcus species NOT DETECTED NOT DETECTED Final   Streptococcus agalactiae NOT DETECTED NOT DETECTED Final   Streptococcus pneumoniae NOT DETECTED NOT DETECTED Final   Streptococcus pyogenes NOT DETECTED NOT DETECTED Final   Acinetobacter baumannii NOT DETECTED NOT DETECTED Final   Enterobacteriaceae species NOT DETECTED NOT DETECTED Final   Enterobacter cloacae complex NOT DETECTED NOT DETECTED Final   Escherichia coli NOT DETECTED NOT DETECTED Final   Klebsiella oxytoca NOT DETECTED NOT DETECTED Final   Klebsiella pneumoniae NOT DETECTED NOT DETECTED Final   Proteus species NOT DETECTED NOT DETECTED Final   Serratia marcescens NOT DETECTED NOT DETECTED Final   Haemophilus influenzae NOT DETECTED NOT DETECTED Final   Neisseria meningitidis NOT DETECTED NOT DETECTED Final   Pseudomonas aeruginosa NOT DETECTED NOT DETECTED Final   Candida albicans NOT DETECTED NOT DETECTED Final     Candida glabrata NOT DETECTED NOT DETECTED Final   Candida krusei NOT DETECTED NOT DETECTED Final   Candida parapsilosis NOT DETECTED NOT DETECTED Final   Candida tropicalis NOT DETECTED NOT DETECTED Final    Comment: Performed at [facility]  Culture, blood (routine x 2)     Status: Abnormal   Collection Time: 12/04/15  8:40 PM  Result Value Ref Range Status   Specimen Description BLOOD RIGHT HAND  Final   Special Requests BOTTLES DRAWN AEROBIC AND ANAEROBIC 5CC  Final   Culture  Setup Time   Final    GRAM POSITIVE COCCI IN CLUSTERS IN BOTH AEROBIC AND ANAEROBIC BOTTLES CRITICAL RESULT CALLED TO, READ BACK BY AND VERIFIED WITH: P CLARK,RN AT 4132 12/05/15 BY L BENFIELD    Culture (A)  Final    STAPHYLOCOCCUS AUREUS SUSCEPTIBILITIES PERFORMED ON PREVIOUS CULTURE WITHIN THE LAST 5 DAYS. Performed at [facility]    Report Status 12/07/2015 FINAL  Final  MRSA PCR Screening     Status: None   Collection Time: 12/05/15  3:38 PM  Result Value Ref Range Status   MRSA by PCR NEGATIVE NEGATIVE Final    Comment:        The GeneXpert MRSA Assay (FDA approved for NASAL specimens only), is one component of a comprehensive MRSA colonization surveillance program. It is not intended to diagnose MRSA infection nor to guide or monitor treatment for MRSA infections.   Culture,  blood (Routine X 2) w Reflex to ID Panel     Status: None (Preliminary result)   Collection Time: 12/07/15  5:30 PM  Result Value Ref Range Status   Specimen Description BLOOD RIGHT HAND  Final   Special Requests IN PEDIATRIC BOTTLE 1CC  Final   Culture  Setup Time   Final    GRAM POSITIVE COCCI IN CLUSTERS IN PEDIATRIC BOTTLE    Culture GRAM POSITIVE COCCI  Final   Report Status PENDING  Incomplete  Culture, blood (Routine X 2) w Reflex to ID Panel     Status: None (Preliminary result)   Collection Time: 12/07/15  5:30 PM  Result Value Ref Range Status   Specimen Description BLOOD LEFT HAND   Final   Special Requests IN PEDIATRIC BOTTLE 2CC  Final   Culture  Setup Time   Final    GRAM POSITIVE COCCI IN CLUSTERS IN PEDIATRIC BOTTLE Organism ID to follow CRITICAL RESULT CALLED TO, READ BACK BY AND VERIFIED WITH: MVonita Moss.D. 11:20 12/08/15 (wilsonm)    Culture GRAM POSITIVE COCCI  Final   Report Status PENDING  Incomplete  Blood Culture ID Panel (Reflexed)     Status: Abnormal   Collection Time: 12/07/15  5:30 PM  Result Value Ref Range Status   Enterococcus species NOT DETECTED NOT DETECTED Final   Listeria monocytogenes NOT DETECTED NOT DETECTED Final   Staphylococcus species DETECTED (A) NOT DETECTED Final    Comment: CRITICAL RESULT CALLED TO, READ BACK BY AND VERIFIED WITH: M. Vonita Moss.D. 11:20 12/08/15 (wilsonm)    Staphylococcus aureus DETECTED (A) NOT DETECTED Final    Comment: CRITICAL RESULT CALLED TO, READ BACK BY AND VERIFIED WITH: M. Radford Pax Pharm.D. 11:20 12/08/15 (wilsonm)    Methicillin resistance NOT DETECTED NOT DETECTED Final   Streptococcus species NOT DETECTED NOT DETECTED Final   Streptococcus agalactiae NOT DETECTED NOT DETECTED Final   Streptococcus pneumoniae NOT DETECTED NOT DETECTED Final   Streptococcus pyogenes NOT DETECTED NOT DETECTED Final   Acinetobacter baumannii NOT DETECTED NOT DETECTED Final   Enterobacteriaceae species NOT DETECTED NOT DETECTED Final   Enterobacter cloacae complex NOT DETECTED NOT DETECTED Final   Escherichia coli NOT DETECTED NOT DETECTED Final   Klebsiella oxytoca NOT DETECTED NOT DETECTED Final   Klebsiella pneumoniae NOT DETECTED NOT DETECTED Final   Proteus species NOT DETECTED NOT DETECTED Final   Serratia marcescens NOT DETECTED NOT DETECTED Final   Haemophilus influenzae NOT DETECTED NOT DETECTED Final   Neisseria meningitidis NOT DETECTED NOT DETECTED Final   Pseudomonas aeruginosa NOT DETECTED NOT DETECTED Final   Candida albicans NOT DETECTED NOT DETECTED Final   Candida glabrata NOT DETECTED NOT  DETECTED Final   Candida krusei NOT DETECTED NOT DETECTED Final   Candida parapsilosis NOT DETECTED NOT DETECTED Final   Candida tropicalis NOT DETECTED NOT DETECTED Final     Impression/Recommendation  Principal Problem:   Bacteremia Active Problems:   Benzodiazepine dependence (HCC)   IVDU (intravenous drug user)   Viral hepatitis B chronic (HCC)   Leukocytosis   Hyponatremia   Thrombocytopenia (HCC)   Chest pain   Staphylococcus aureus bacteremia   Septic pulmonary embolism (HCC)   Hypokalemia   Erik [name] is a 20 y.o. 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 MSSA right-sided endocarditis:       [place] [facility] Staphylococcus aureus bacteremia  Staphylococcus aureus bacteremia (SAB) is associated with a high rate of complications and mortality.  Specific aspects of clinical management are critical to optimizing the outcome of patients with SAB.  Therefore, the [facility] Antimicrobial Management Team [facility]) has initiated an intervention aimed at improving the management of SAB at [facility].  To do so, Infectious Diseases physicians are providing an evidence-based consult for the management of all patients with SAB.     Yes No Comments  Perform follow-up blood cultures (even if the patient is afebrile) to ensure clearance of bacteremia [x] [] Repeat blood cultures were still + on levaquin, repeating them again today  Remove vascular catheter and obtain follow-up blood cultures after the removal of the catheter [] [] DO NOT PLACE PICC [place] ARE NO GROWTH X 4-5 DAYS  Perform echocardiography to evaluate for endocarditis (transthoracic ECHO is 40-50% sensitive, TEE is > 90% sensitive) [] [] Please keep in mind, that neither test can definitively EXCLUDE endocarditis, and that should clinical suspicion  remain high for endocarditis the patient should then still be treated with an "endocarditis" duration of therapy = 6 weeks  Pt  Scheduled for transesophageal echocardiogram tomorrow   Consult electrophysiologist to evaluate implanted cardiac device (pacemaker, ICD) [] [] Not applicable   Ensure source control [] [] Have all abscesses been drained effectively? Have deep seeded infections (septic joints or osteomyelitis) had appropriate surgical debridement? Source seems to be his injection drug use   Investigate for "metastatic" sites of infection [] [] Does the patient have ANY symptom or physical exam finding that would suggest a deeper infection (back or neck pain that may be suggestive of vertebral osteomyelitis or epidural abscess, muscle pain that could be a symptom of pyomyositis)?  Keep in mind that for deep seeded infections MRI imaging with contrast is preferred rather than other often insensitive tests such as plain x-rays, especially early in a patient's presentation.   No clinical signs pointing to and others metastatic site of infection at this point   Change antibiotic therapy to Vancomycin given his severe penicillin allergy and given the fact that we have not any history or color documented history of him having tolerated cephalosporins  [] [] Beta-lactam antibiotics are preferred for MSSA due to higher cure rates.   If on Vancomycin, goal trough should be 15 - 20 mcg/mL  Estimated duration of IV antibiotic therapy:  6 weeks but would need to be in a skilled nursing facility  [] [] Consult case management for probably prolonged outpatient IV antibiotic therapy    ONCE WE HAVE THE TEE [place] FORMALLY I DOUBT HE WILL END UP HAVING VALVE REPLACEMENT BUT VEGETATION IS LARGE and we also agreed as part of Endocarditis task force to have these pts seen by multiple disciplines  #2 Chronic hepatitis be without hepatic coma: I would are you that  there is a good reason to consider treating him with anti-hepatitis B therapy in particular if there is any suspicion of shared needles as he could spread hep B in the community if he shares his needles with fellow IV drug users. Today he denied using shared needles but it is something to consider. I don't think he needs screening for hepatocellular carcinoma at this young age  #3 Intravenous drug use: Clearly he needs to being gauged in a opiate replacement plan to treat his injection drug use as are hisa for a to administer  intravenous drugs and to avoid further episodes of infectious endocarditis from injection drug use.  #4 cracked tooth and problems with dentition: Consider Panorex and he will need to be plugged into the dental as an outpatient. Proper dental hygiene is clearly also critical given his now documented history of endocarditis he will also need antecedent antibiotics prior to invasive dental procedures.  I spent greater than 80 minutes with the patient including greater than 50% of time in face to face counsel of the patient re his MSSA infectious endocarditis with septic emboli, hep b,  and in coordination of his  Care.  He also gave me mission to talk to his father Hanad Leino and I called him over his cell phone shortly after leaving the room with Marjory Lies.    12/08/2015, 11:59 AM   Thank you so much for this interesting consult  [place] for [place] [phone] (pager) [phone] (office) 12/08/2015, 11:59 AM  Rhina Brack[place] 12/08/2015, 11:59 AM

## 2015-12-08 NOTE — Progress Notes (Signed)
PROGRESS NOTE  Erik [name] [id number] DOB: 1995-12-14 DOA: 12/05/2015 PCP: Gwendolyn Grant, MD  Brief History: 20 year old male with a history of intravenous drug use, anxiety, endocarditis, chronic hepatitis B presented after being informed of positive blood cultures that was obtained on 12/04/2015. Preliminary results show Staphylococcus aureus in 2 out of 2 sets. The patient initially presented to the emergency department on 12/04/2015 with chest pain and shortness of breath. The patient was given a dose of levofloxacin and discharged from the emergency department after blood cultures were obtained. The patient states that he last used heroin approximately 2 weeks prior to this admission.  during his admission from 02/22/2015 through 02/26/2015,, the patient was admitted for treatment of rhabdomyolysis, cellulitis of bilateral feet, and elevated troponins. The patient also has had a previous admission back in September 2016 for narcotic induced respiratory failure requiring intubation. In addition, the patient has had a number of previous admissions in the past for narcotic and other illicit drug overdose.The patient was started on intravenous vancomycin and admitted for further evaluation of his bacteremia.  Assessment/Plan: MSSA bacteremia/Tricuspid Valve Endocarditis -Etiology =  intravenous drug use -Infectious disease  consulted per protocol -Surveillance blood cultures--could not obtain -Already requested TEE--will be done 10/3 -pt has anaphylaxis to PCN--->continue vancomycin -12/07/15 Echo--EF 50-55%, TV vegetation, moderate TR  Pulmonary opacities/septic emboli -Secondary to metastatic infection -CT angio chest---patchy consolidations peripheral bilateral lungs largest in RLL including cavitary consolidation right apex x 2  Hyponatremia -Secondary to volume depletion -Improved with fluid resuscitation  Thrombocytopenia -Repeat HIV--pending -Likely due  to chronic liver disease secondary to chronic hepatitis B -B12--392 -screen hep C -improving with treatment of infection  Chronic hepatitis B infection without coma -Appears clinically compensated at this time -Outpatient follow-up -screen hep C & AFP--neg  Chest pain -minimally elevated troponin likely vasospasm/demand ischemia -02/22/2015--heart catheterization--normal coronaries -personally reviewed EKG--sinus rhythm, nonspecific T-wave changes -Personally reviewed CXR--vagueparenchymal opacities in the right lung -CTA chest--patchy consolidations peripheral bilateral lungs largest in RLL including cavitary consolidation right apex x 2; No PE -norco for pain, will not be giving IV opioids  Hypokalemia -repleted -check mag     Disposition Plan: Not stable for discharge. Anticipate several days/weeks Family Communication: No Family at bedside--Total time spent 35 minutes.  Greater than 50% spent face to face counseling and coordinating care.   Consultants: ID, Cardiology for TEE  Code Status: FULL   DVT Prophylaxis: SCDs   Procedures: As Listed in Progress Note Above  Antibiotics: None   Subjective: Still c/o left sided chest pain--somewhat pleuritic in nature but also reproducible on examination. Denies fevers, chills, nausea, vomiting, diarrhea, abdominal pain. No dysuria or hematuria. No rashes.  Objective: Vitals:   12/07/15 0439 12/07/15 1315 12/08/15 0533 12/08/15 1409  BP: (!) 100/56 108/67 131/73 125/78  Pulse: 96 90 73 96  Resp: 16 16 18 15   Temp: 99.2 F (37.3 C) 98.8 F (37.1 C) 98.4 F (36.9 C) 97.8 F (36.6 C)  TempSrc: Oral Oral  Oral  SpO2: 97% 100% 98% 100%  Weight:      Height:        Intake/Output Summary (Last 24 hours) at 12/08/15 1603 Last data filed at 12/08/15 1013  Gross per 24 hour  Intake              520 ml  Output             1000 ml  Net             -  480 ml   Weight change:  Exam:   General:   Pt is alert, follows commands appropriately, not in acute distress  HEENT: No icterus, No thrush, No neck mass, [place]/AT  Cardiovascular: RRR, S1/S2, no rubs, no gallops  Respiratory: CTA bilaterally, no wheezing, no crackles, no rhonchi  Abdomen: Soft/+BS, non tender, non distended, no guarding  Extremities: No edema, No lymphangitis, No petechiae, No rashes, no synovitis   Data Reviewed: I have personally reviewed following labs and imaging studies Basic Metabolic Panel:  Recent Labs Lab 12/04/15 1309 12/05/15 1600 12/06/15 0226 12/07/15 0757  NA 127* 129* 131* 136  K 3.6 3.7 3.9 3.3*  CL 92* 92* 100* 103  CO2 25 25 23 24   GLUCOSE 149* 171* 111* 109*  BUN 10 11 12 8   CREATININE 0.75 0.74 0.71 0.60*  CALCIUM 8.4* 8.6* 8.3* 8.2*   Liver Function Tests:  Recent Labs Lab 12/04/15 1309 12/05/15 1600  AST 28 26  ALT 23 23  ALKPHOS 75 79  BILITOT 0.8 0.5  PROT 7.4 6.8  ALBUMIN 3.3* 2.8*    Recent Labs Lab 12/04/15 1309  LIPASE 17   No results for input(s): AMMONIA in the last 168 hours. Coagulation Profile: No results for input(s): INR, PROTIME in the last 168 hours. CBC:  Recent Labs Lab 12/04/15 1309 12/05/15 1600 12/06/15 0226 12/07/15 0757 12/08/15 1234  WBC 10.1 13.6* 9.2 12.9* 12.7*  NEUTROABS  --  10.6*  --   --   --   HGB 14.0 13.3 14.0 12.6* 15.1  HCT 40.5 38.4* 40.9 37.6* 45.3  MCV 80.0 80.5 82.5 81.6 83.1  PLT 77* 92* 86* 158 207   Cardiac Enzymes:  Recent Labs Lab 12/04/15 1309 12/05/15 1828 12/05/15 2336  CKTOTAL 12*  --   --   TROPONINI  --  0.03* <0.03   BNP: Invalid input(s): POCBNP CBG: No results for input(s): GLUCAP in the last 168 hours. HbA1C: No results for input(s): HGBA1C in the last 72 hours. Urine analysis:    Component Value Date/Time   COLORURINE YELLOW 12/05/2015 2053   APPEARANCEUR CLOUDY (A) 12/05/2015 2053   APPEARANCEUR Clear 02/27/2014 0351   LABSPEC 1.019 12/05/2015 2053   LABSPEC 1.025 02/27/2014  0351   PHURINE 6.5 12/05/2015 2053   GLUCOSEU NEGATIVE 12/05/2015 2053   GLUCOSEU Negative 02/27/2014 0351   HGBUR NEGATIVE 12/05/2015 2053   BILIRUBINUR NEGATIVE 12/05/2015 2053   BILIRUBINUR Negative 02/27/2014 0351   KETONESUR NEGATIVE 12/05/2015 2053   PROTEINUR NEGATIVE 12/05/2015 2053   UROBILINOGEN 0.2 11/12/2014 2124   NITRITE NEGATIVE 12/05/2015 2053   LEUKOCYTESUR NEGATIVE 12/05/2015 2053   LEUKOCYTESUR Negative 02/27/2014 0351   Sepsis Labs: @LABRCNTIP (procalcitonin:4,lacticidven:4) ) Recent Results (from the past 240 hour(s))  Culture, blood (routine x 2)     Status: Abnormal   Collection Time: 12/04/15  7:20 PM  Result Value Ref Range Status   Specimen Description BLOOD LEFT ANTECUBITAL  Final   Special Requests BOTTLES DRAWN AEROBIC AND ANAEROBIC 5 CC  Final   Culture  Setup Time   Final    GRAM POSITIVE COCCI IN CLUSTERS IN BOTH AEROBIC AND ANAEROBIC BOTTLES CRITICAL RESULT CALLED TO, READ BACK BY AND VERIFIED WITH: P CLARK,RN AT [id number] 12/05/15 BY L BENFIELD Performed at [place] (A)  Final   Report Status 12/07/2015 FINAL  Final   Organism ID, Bacteria STAPHYLOCOCCUS AUREUS  Final      Susceptibility   Staphylococcus aureus -  MIC*    CIPROFLOXACIN <=0.5 SENSITIVE Sensitive     ERYTHROMYCIN >=8 RESISTANT Resistant     GENTAMICIN <=0.5 SENSITIVE Sensitive     OXACILLIN <=0.25 SENSITIVE Sensitive     TETRACYCLINE <=1 SENSITIVE Sensitive     VANCOMYCIN 1 SENSITIVE Sensitive     TRIMETH/SULFA <=10 SENSITIVE Sensitive     CLINDAMYCIN <=0.25 SENSITIVE Sensitive     RIFAMPIN <=0.5 SENSITIVE Sensitive     Inducible Clindamycin NEGATIVE Sensitive     * STAPHYLOCOCCUS AUREUS  Blood Culture ID Panel (Reflexed)     Status: Abnormal   Collection Time: 12/04/15  7:20 PM  Result Value Ref Range Status   Enterococcus species NOT DETECTED NOT DETECTED Final   Listeria monocytogenes NOT DETECTED NOT DETECTED Final    Staphylococcus species DETECTED (A) NOT DETECTED Final    Comment: CRITICAL RESULT CALLED TO, READ BACK BY AND VERIFIED WITH: P CLARK,RN AT [id number] 12/05/15 BY L BENFIELD    Staphylococcus aureus DETECTED (A) NOT DETECTED Final    Comment: CRITICAL RESULT CALLED TO, READ BACK BY AND VERIFIED WITH: P CLARK,RN AT [id number] 12/05/15 BY L BENFIELD    Methicillin resistance NOT DETECTED NOT DETECTED Final   Streptococcus species NOT DETECTED NOT DETECTED Final   Streptococcus agalactiae NOT DETECTED NOT DETECTED Final   Streptococcus pneumoniae NOT DETECTED NOT DETECTED Final   Streptococcus pyogenes NOT DETECTED NOT DETECTED Final   Acinetobacter baumannii NOT DETECTED NOT DETECTED Final   Enterobacteriaceae species NOT DETECTED NOT DETECTED Final   Enterobacter cloacae complex NOT DETECTED NOT DETECTED Final   Escherichia coli NOT DETECTED NOT DETECTED Final   Klebsiella oxytoca NOT DETECTED NOT DETECTED Final   Klebsiella pneumoniae NOT DETECTED NOT DETECTED Final   Proteus species NOT DETECTED NOT DETECTED Final   Serratia marcescens NOT DETECTED NOT DETECTED Final   Haemophilus influenzae NOT DETECTED NOT DETECTED Final   Neisseria meningitidis NOT DETECTED NOT DETECTED Final   Pseudomonas aeruginosa NOT DETECTED NOT DETECTED Final   Candida albicans NOT DETECTED NOT DETECTED Final   Candida glabrata NOT DETECTED NOT DETECTED Final   Candida krusei NOT DETECTED NOT DETECTED Final   Candida parapsilosis NOT DETECTED NOT DETECTED Final   Candida tropicalis NOT DETECTED NOT DETECTED Final    Comment: Performed at [facility]  Culture, blood (routine x 2)     Status: Abnormal   Collection Time: 12/04/15  8:40 PM  Result Value Ref Range Status   Specimen Description BLOOD RIGHT HAND  Final   Special Requests BOTTLES DRAWN AEROBIC AND ANAEROBIC 5CC  Final   Culture  Setup Time   Final    GRAM POSITIVE COCCI IN CLUSTERS IN BOTH AEROBIC AND ANAEROBIC BOTTLES CRITICAL RESULT CALLED TO,  READ BACK BY AND VERIFIED WITH: P CLARK,RN AT [id number] 12/05/15 BY L BENFIELD    Culture (A)  Final    STAPHYLOCOCCUS AUREUS SUSCEPTIBILITIES PERFORMED ON PREVIOUS CULTURE WITHIN THE LAST 5 DAYS. Performed at [facility]    Report Status 12/07/2015 FINAL  Final  MRSA PCR Screening     Status: None   Collection Time: 12/05/15  3:38 PM  Result Value Ref Range Status   MRSA by PCR NEGATIVE NEGATIVE Final    Comment:        The GeneXpert MRSA Assay (FDA approved for NASAL specimens only), is one component of a comprehensive MRSA colonization surveillance program. It is not intended to diagnose MRSA infection nor to guide or monitor treatment for MRSA infections.  Culture, blood (Routine X 2) w Reflex to ID Panel     Status: None (Preliminary result)   Collection Time: 12/07/15  5:30 PM  Result Value Ref Range Status   Specimen Description BLOOD RIGHT HAND  Final   Special Requests IN PEDIATRIC BOTTLE 1CC  Final   Culture  Setup Time   Final    GRAM POSITIVE COCCI IN CLUSTERS IN PEDIATRIC BOTTLE    Culture GRAM POSITIVE COCCI  Final   Report Status PENDING  Incomplete  Culture, blood (Routine X 2) w Reflex to ID Panel     Status: None (Preliminary result)   Collection Time: 12/07/15  5:30 PM  Result Value Ref Range Status   Specimen Description BLOOD LEFT HAND  Final   Special Requests IN PEDIATRIC BOTTLE 2CC  Final   Culture  Setup Time   Final    GRAM POSITIVE COCCI IN CLUSTERS IN PEDIATRIC BOTTLE Organism ID to follow CRITICAL RESULT CALLED TO, READ BACK BY AND VERIFIED WITH: MVonita Moss.D. 11:20 12/08/15 ([email or web address])    Culture GRAM POSITIVE COCCI  Final   Report Status PENDING  Incomplete  Blood Culture ID Panel (Reflexed)     Status: Abnormal   Collection Time: 12/07/15  5:30 PM  Result Value Ref Range Status   Enterococcus species NOT DETECTED NOT DETECTED Final   Listeria monocytogenes NOT DETECTED NOT DETECTED Final   Staphylococcus species DETECTED (A) NOT  DETECTED Final    Comment: CRITICAL RESULT CALLED TO, READ BACK BY AND VERIFIED WITH: M. Vonita Moss.D. 11:20 12/08/15 ([email or web address])    Staphylococcus aureus DETECTED (A) NOT DETECTED Final    Comment: CRITICAL RESULT CALLED TO, READ BACK BY AND VERIFIED WITH: M. Radford Pax Pharm.D. 11:20 12/08/15 ([email or web address])    Methicillin resistance NOT DETECTED NOT DETECTED Final   Streptococcus species NOT DETECTED NOT DETECTED Final   Streptococcus agalactiae NOT DETECTED NOT DETECTED Final   Streptococcus pneumoniae NOT DETECTED NOT DETECTED Final   Streptococcus pyogenes NOT DETECTED NOT DETECTED Final   Acinetobacter baumannii NOT DETECTED NOT DETECTED Final   Enterobacteriaceae species NOT DETECTED NOT DETECTED Final   Enterobacter cloacae complex NOT DETECTED NOT DETECTED Final   Escherichia coli NOT DETECTED NOT DETECTED Final   Klebsiella oxytoca NOT DETECTED NOT DETECTED Final   Klebsiella pneumoniae NOT DETECTED NOT DETECTED Final   Proteus species NOT DETECTED NOT DETECTED Final   Serratia marcescens NOT DETECTED NOT DETECTED Final   Haemophilus influenzae NOT DETECTED NOT DETECTED Final   Neisseria meningitidis NOT DETECTED NOT DETECTED Final   Pseudomonas aeruginosa NOT DETECTED NOT DETECTED Final   Candida albicans NOT DETECTED NOT DETECTED Final   Candida glabrata NOT DETECTED NOT DETECTED Final   Candida krusei NOT DETECTED NOT DETECTED Final   Candida parapsilosis NOT DETECTED NOT DETECTED Final   Candida tropicalis NOT DETECTED NOT DETECTED Final     Scheduled Meds: . vancomycin  1,500 mg Intravenous Q8H   Continuous Infusions:   Procedures/Studies: Dg Chest 2 View  Result Date: 12/04/2015 CLINICAL DATA:  Left-sided chest pain, shortness of breath, former smoking history EXAM: CHEST  2 VIEW COMPARISON:  Chest x-ray of 02/22/2015 FINDINGS: There are vague parenchymal opacities scattered throughout the right lung. In this young patient an infectious or inflammatory process would seem  most likely. No definite opacity is seen throughout the left lung. There are somewhat prominent perihilar markings better seen on the lateral view which may indicate bronchitis. Mediastinal and hilar contours are unremarkable with no evidence  of hilar or adenopathy. The heart is within normal limits in size. No bony abnormality is seen. IMPRESSION: Patchy parenchymal opacities throughout the right lung most likely inflammatory or infectious in etiology. Recommend followup. No adenopathy. Also question bronchitis. Electronically Signed   By: Ivar Drape M.D.   On: 12/04/2015 14:29   Ct Angio Chest Pe W Or Wo Contrast  Result Date: 12/06/2015 CLINICAL DATA:  Left-sided chest pain for 2-3 days. Staph aureus bacteremia with chest pain. Clinical suspicion of septic emboli. Additional history of drug abuse/dependence, IV drug abuser, history of endocarditis, hepatitis B. EXAM: CT ANGIOGRAPHY CHEST WITH CONTRAST TECHNIQUE: Multidetector CT imaging of the chest was performed using the standard protocol during bolus administration of intravenous contrast. Multiplanar CT image reconstructions and MIPs were obtained to evaluate the vascular anatomy. CONTRAST:  100 cc Isovue 370 COMPARISON:  Chest x-ray dated 12/04/2015. FINDINGS: Cardiovascular: The majority of the most peripheral segmental and subsegmental pulmonary arteries cannot be definitively characterized due to patient breathing motion artifact but there is no pulmonary embolism identified within the main, lobar or central segmental pulmonary arteries bilaterally. Heart size is normal. No pericardial effusion. Thoracic aorta is normal. No aortic aneurysm or dissection. Mediastinum/Nodes: Scattered small lymph nodes within the mediastinum, perihilar and axillary regions, likely reactive in nature. Lungs/Pleura: Patchy nodular consolidations are seen throughout the periphery of both lungs, largest in the right lower lobe measuring 2.3 cm greatest dimension, including  a cavitary consolidation at the right lung apex laterally which measures 1 9 cm and an additional cavitary consolidation in the right lung apex anteriorly which measures 1.2 cm. Larger denser consolidation at the left lung base is likely atelectasis, less likely pneumonia. Additional patchy atelectasis noted at the right lung base. Small left pleural effusion. No pneumothorax. Upper Abdomen: Limited images of the upper abdomen are unremarkable. Musculoskeletal: No chest wall abnormality. No acute or significant osseous findings. Review of the MIP images confirms the above findings. IMPRESSION: 1. Numerous nodular consolidations throughout the periphery of both lungs, some of which have central cavitations, largest in the right lower lobe measuring 2.3 cm greatest dimension. Differential includes atypical infection (such as fungal pneumonia) and septic emboli. Favor septic emboli. 2. Additional denser consolidation at the left lung base is most likely atelectasis, less likely pneumonia. 3. Small left pleural effusion. 4. No pulmonary embolism, with mild study limitations detailed above. 5. Heart size is upper normal.  No pericardial effusion. Electronically Signed   By: Franki Cabot M.D.   On: 12/06/2015 18:51    Tacuma Graffam, DO  [facility] Pager [phone]  If 7PM-7AM, please contact night-coverage [email or web address] Password [email or web address] 12/08/2015, 4:03 PM   LOS: 3 days

## 2015-12-08 NOTE — Progress Notes (Signed)
Pharmacy Antibiotic Note  Erik [name] is a 20 y.o. male admitted on 12/05/2015 with MSSA bacteremia and endocarditis.  Pharmacy has been consulted for Vancomycin dosing (in the setting of PCN allergy).  Vancomycin trough = 14 on 1gm IV q6h.  The q6h dosing interval is difficult for nursing as well as laboratory monitoring of trough levels.  Will try a 1500mg  IV q8h regimen to obtain calculated trough ~ 16.  Noted pt has a hx of AKI.  Will need to be sensitive to this monitor Vancomycin dosing closely.  Expect patient will reach a point where the dose can be reduced as the drug starts to accumulate.  Plan: Vancomycin 1500 IV every 8 hours.  Goal trough 15-20 mcg/mL. Vancomycin trough level and BMET 10/4 with AM labs.  Height: 5\' 6"  (167.6 cm) Weight: 129 lb 6.6 oz (58.7 kg) IBW/kg (Calculated) : 63.8  Temp (24hrs), Avg:98.1 F (36.7 C), Min:97.8 F (36.6 C), Max:98.4 F (36.9 C)   Recent Labs Lab 12/04/15 1309 12/05/15 1600 12/05/15 1844 12/05/15 2336 12/06/15 0226 12/07/15 0757 12/08/15 1230 12/08/15 1234  WBC 10.1 13.6*  --   --  9.2 12.9*  --  12.7*  CREATININE 0.75 0.74  --   --  0.71 0.60*  --   --   LATICACIDVEN  --   --  3.0* 1.3 1.9  --   --   --   VANCOTROUGH  --   --   --   --   --  6* 14*  --     Estimated Creatinine Clearance: 120.3 mL/min (by C-G formula based on SCr of 0.6 mg/dL (L)).    Allergies  Allergen Reactions  . Penicillins Anaphylaxis and Swelling    All "-cillins."  Angioedema also  . Sulfa Antibiotics Swelling and Rash    Childhood allergic reaction    Antimicrobials this admission: Vanc 9/29 >>  Dose adjustments this admission: 10/1 VT = 6 on 750mg  IV q8h: Dose increased to 1000mg  Q6H 10/2 VT = 14 on 1gm q6h: Dose changed to avoid q6h interval  Microbiology results: 9/28 BCx x4 - [facility] (BCID MSSA) 10/1 BCx - [facility] x 2 (BCID MSSA) -MRSA PCR negative  Thank you for allowing pharmacy to be a part of this patient's care.  [employer], Pharm.D., BCPS Clinical Pharmacist Pager [phone] 12/08/2015 2:45 PM

## 2015-12-08 NOTE — Progress Notes (Signed)
PHARMACY - PHYSICIAN COMMUNICATION CRITICAL VALUE ALERT - BLOOD CULTURE IDENTIFICATION (BCID)  Results for orders placed or performed during the hospital encounter of 12/05/15  Blood Culture ID Panel (Reflexed) (Collected: 12/07/2015  5:30 PM)  Result Value Ref Range   Enterococcus species NOT DETECTED NOT DETECTED   Listeria monocytogenes NOT DETECTED NOT DETECTED   Staphylococcus species DETECTED (A) NOT DETECTED   Staphylococcus aureus DETECTED (A) NOT DETECTED   Methicillin resistance NOT DETECTED NOT DETECTED   Streptococcus species NOT DETECTED NOT DETECTED   Streptococcus agalactiae NOT DETECTED NOT DETECTED   Streptococcus pneumoniae NOT DETECTED NOT DETECTED   Streptococcus pyogenes NOT DETECTED NOT DETECTED   Acinetobacter baumannii NOT DETECTED NOT DETECTED   Enterobacteriaceae species NOT DETECTED NOT DETECTED   Enterobacter cloacae complex NOT DETECTED NOT DETECTED   Escherichia coli NOT DETECTED NOT DETECTED   Klebsiella oxytoca NOT DETECTED NOT DETECTED   Klebsiella pneumoniae NOT DETECTED NOT DETECTED   Proteus species NOT DETECTED NOT DETECTED   Serratia marcescens NOT DETECTED NOT DETECTED   Haemophilus influenzae NOT DETECTED NOT DETECTED   Neisseria meningitidis NOT DETECTED NOT DETECTED   Pseudomonas aeruginosa NOT DETECTED NOT DETECTED   Candida albicans NOT DETECTED NOT DETECTED   Candida glabrata NOT DETECTED NOT DETECTED   Candida krusei NOT DETECTED NOT DETECTED   Candida parapsilosis NOT DETECTED NOT DETECTED   Candida tropicalis NOT DETECTED NOT DETECTED    Name of physician (or Provider) Contacted: Dr. Tommy Medal  20 year old male with history of prior infective endocarditis, now with MSSA bacteremia isolated in blood cultures obtained 9/28 in the ED and 10/1 while inpatient. He also has a tricuspid valve vegetation.   He has a pencillin allergy that complicates his treatment.  Usually a beta lactam would be preferred for MSSA bacteremia but he has had  minimal beta lactam exposure (1 dose of Zosyn given in September 2016).  He is currently on Vancomycin.   Norva Riffle 12/08/2015  12:26 PM

## 2015-12-09 ENCOUNTER — Inpatient Hospital Stay (HOSPITAL_COMMUNITY): Payer: 59 | Admitting: Anesthesiology

## 2015-12-09 ENCOUNTER — Inpatient Hospital Stay (HOSPITAL_COMMUNITY): Payer: 59

## 2015-12-09 ENCOUNTER — Encounter (HOSPITAL_COMMUNITY): Payer: Self-pay | Admitting: Anesthesiology

## 2015-12-09 ENCOUNTER — Encounter (HOSPITAL_COMMUNITY)
Admission: AD | Disposition: A | Discharge status: Home / Self Care | Payer: Self-pay | Source: Ambulatory Visit | Attending: Family Medicine

## 2015-12-09 DIAGNOSIS — I071 Rheumatic tricuspid insufficiency: Secondary | ICD-10-CM

## 2015-12-09 DIAGNOSIS — I361 Nonrheumatic tricuspid (valve) insufficiency: Secondary | ICD-10-CM

## 2015-12-09 DIAGNOSIS — I39 Endocarditis and heart valve disorders in diseases classified elsewhere: Secondary | ICD-10-CM

## 2015-12-09 DIAGNOSIS — R7881 Bacteremia: Secondary | ICD-10-CM

## 2015-12-09 DIAGNOSIS — F1990 Other psychoactive substance use, unspecified, uncomplicated: Secondary | ICD-10-CM

## 2015-12-09 HISTORY — DX: Rheumatic tricuspid insufficiency: I07.1

## 2015-12-09 HISTORY — PX: TEE WITHOUT CARDIOVERSION: SHX5443

## 2015-12-09 LAB — BASIC METABOLIC PANEL
ANION GAP: 7 (ref 5–15)
BUN: 7 mg/dL (ref 6–20)
CALCIUM: 8.5 mg/dL — AB (ref 8.9–10.3)
CO2: 22 mmol/L (ref 22–32)
CREATININE: 0.63 mg/dL (ref 0.61–1.24)
Chloride: 107 mmol/L (ref 101–111)
GLUCOSE: 89 mg/dL (ref 65–99)
Potassium: 4.2 mmol/L (ref 3.5–5.1)
Sodium: 136 mmol/L (ref 135–145)

## 2015-12-09 LAB — MAGNESIUM: Magnesium: 2 mg/dL (ref 1.7–2.4)

## 2015-12-09 SURGERY — ECHOCARDIOGRAM, TRANSESOPHAGEAL
Anesthesia: Monitor Anesthesia Care

## 2015-12-09 MED ORDER — PROPOFOL 10 MG/ML IV BOLUS
INTRAVENOUS | Status: DC | PRN
  Administered 2015-12-09: 30 mg via INTRAVENOUS
  Administered 2015-12-09 (×2): 20 mg via INTRAVENOUS
  Administered 2015-12-09 (×2): 30 mg via INTRAVENOUS

## 2015-12-09 MED ORDER — BUTAMBEN-TETRACAINE-BENZOCAINE 2-2-14 % EX AERO
INHALATION_SPRAY | CUTANEOUS | Status: DC | PRN
  Administered 2015-12-09: 2 via TOPICAL

## 2015-12-09 MED ORDER — PROPOFOL 500 MG/50ML IV EMUL
INTRAVENOUS | Status: DC | PRN
  Administered 2015-12-09: 100 ug/kg/min via INTRAVENOUS

## 2015-12-09 MED ORDER — LACTATED RINGERS IV SOLN
INTRAVENOUS | Status: DC | PRN
  Administered 2015-12-09: 12:00:00 via INTRAVENOUS

## 2015-12-09 MED ORDER — MIDAZOLAM HCL 5 MG/5ML IJ SOLN
INTRAMUSCULAR | Status: DC | PRN
  Administered 2015-12-09: 2 mg via INTRAVENOUS

## 2015-12-09 MED ORDER — SODIUM CHLORIDE 0.9 % IV SOLN
INTRAVENOUS | Status: DC
  Administered 2015-12-12: 08:00:00 via INTRAVENOUS

## 2015-12-09 NOTE — Progress Notes (Signed)
Subjective: No new complaints   Antibiotics:  Anti-infectives    Start     Dose/Rate Route Frequency Ordered Stop   12/08/15 2200  vancomycin (VANCOCIN) 1,500 mg in sodium chloride 0.9 % 500 mL IVPB     1,500 mg 250 mL/hr over 120 Minutes Intravenous Every 8 hours 12/08/15 1447     12/07/15 1000  vancomycin (VANCOCIN) 1,750 mg in sodium chloride 0.9 % 500 mL IVPB  Status:  Discontinued     1,750 mg 250 mL/hr over 120 Minutes Intravenous Every 8 hours 12/07/15 0947 12/07/15 0957   12/07/15 1000  vancomycin (VANCOCIN) IVPB 1000 mg/200 mL premix  Status:  Discontinued     1,000 mg 200 mL/hr over 60 Minutes Intravenous Every 6 hours 12/07/15 0958 12/08/15 1447   12/06/15 0100  vancomycin (VANCOCIN) IVPB 750 mg/150 ml premix  Status:  Discontinued     750 mg 150 mL/hr over 60 Minutes Intravenous Every 8 hours 12/05/15 1605 12/07/15 0947   12/05/15 1615  vancomycin (VANCOCIN) IVPB 1000 mg/200 mL premix     1,000 mg 200 mL/hr over 60 Minutes Intravenous  Once 12/05/15 1605 12/05/15 2008      Medications: Scheduled Meds: . vancomycin  1,500 mg Intravenous Q8H   Continuous Infusions: . sodium chloride 400 mL (12/09/15 1440)   PRN Meds:.acetaminophen **OR** acetaminophen, bisacodyl, HYDROcodone-acetaminophen, ketorolac, ondansetron **OR** ondansetron (ZOFRAN) IV, traZODone    Objective: Weight change:   Intake/Output Summary (Last 24 hours) at 12/09/15 1458 Last data filed at 12/09/15 1223  Gross per 24 hour  Intake              910 ml  Output              650 ml  Net              260 ml   Blood pressure 118/78, pulse 90, temperature 98.5 F (36.9 C), temperature source Oral, resp. rate 16, height 5\' 6"  (1.676 m), weight 129 lb 6.6 oz (58.7 kg), SpO2 100 %. Temp:  [98.1 F (36.7 C)-99.1 F (37.3 C)] 98.5 F (36.9 C) (10/03 1445) Pulse Rate:  [68-91] 90 (10/03 1445) Resp:  [14-26] 16 (10/03 1445) BP: (109-130)/(57-84) 118/78 (10/03 1445) SpO2:  [98 %-100 %]  100 % (10/03 1445)  Physical Exam: General: Alert and awake, oriented x3, not in any acute distress. HEENT: anicteric sclera,  Conjunctiva are clear ,EOMI, oropharynx clear and without exudate, Cardiovascular: tachycardic rate, normal r,  no murmur rubs or gallops Pulmonary: clear to auscultation bilaterally, no wheezing, rales or rhonchi, really having full breaths due to pleuritic chest pain Gastrointestinal: soft nontender, nondistended, normal bowel sounds, Musculoskeletal: no  clubbing or edema noted bilaterally,  Skin, soft tissue: no rashes, tattoos present, or Janeway lesions splitter hemorrhages or Osler's nodes Neuro: nonfocal, strength and sensation intact  CBC:  CBC Latest Ref Rng & Units 12/08/2015 12/07/2015 12/06/2015  WBC 4.0 - 10.5 K/uL 12.7(H) 12.9(H) 9.2  Hemoglobin 13.0 - 17.0 g/dL 15.1 12.6(L) 14.0  Hematocrit 39.0 - 52.0 % 45.3 37.6(L) 40.9  Platelets 150 - 400 K/uL 207 158 86(L)     BMET  Recent Labs  12/07/15 0757 12/09/15 0444  NA 136 136  K 3.3* 4.2  CL 103 107  CO2 24 22  GLUCOSE 109* 89  BUN 8 7  CREATININE 0.60* 0.63  CALCIUM 8.2* 8.5*     Liver Panel  No results for input(s): PROT, ALBUMIN, AST,  ALT, ALKPHOS, BILITOT, BILIDIR, IBILI in the last 72 hours.     Sedimentation Rate No results for input(s): ESRSEDRATE in the last 72 hours. C-Reactive Protein No results for input(s): CRP in the last 72 hours.  Micro Results: Recent Results (from the past 720 hour(s))  Culture, blood (routine x 2)     Status: Abnormal   Collection Time: 12/04/15  7:20 PM  Result Value Ref Range Status   Specimen Description BLOOD LEFT ANTECUBITAL  Final   Special Requests BOTTLES DRAWN AEROBIC AND ANAEROBIC 5 CC  Final   Culture  Setup Time   Final    GRAM POSITIVE COCCI IN CLUSTERS IN BOTH AEROBIC AND ANAEROBIC BOTTLES CRITICAL RESULT CALLED TO, READ BACK BY AND VERIFIED WITH: P CLARK,RN AT [id number] 12/05/15 BY L BENFIELD Performed at [facility] (A)  Final   Report Status 12/07/2015 FINAL  Final   Organism ID, Bacteria STAPHYLOCOCCUS AUREUS  Final      Susceptibility   Staphylococcus aureus - MIC*    CIPROFLOXACIN <=0.5 SENSITIVE Sensitive     ERYTHROMYCIN >=8 RESISTANT Resistant     GENTAMICIN <=0.5 SENSITIVE Sensitive     OXACILLIN <=0.25 SENSITIVE Sensitive     TETRACYCLINE <=1 SENSITIVE Sensitive     VANCOMYCIN 1 SENSITIVE Sensitive     TRIMETH/SULFA <=10 SENSITIVE Sensitive     CLINDAMYCIN <=0.25 SENSITIVE Sensitive     RIFAMPIN <=0.5 SENSITIVE Sensitive     Inducible Clindamycin NEGATIVE Sensitive     * STAPHYLOCOCCUS AUREUS  Blood Culture ID Panel (Reflexed)     Status: Abnormal   Collection Time: 12/04/15  7:20 PM  Result Value Ref Range Status   Enterococcus species NOT DETECTED NOT DETECTED Final   Listeria monocytogenes NOT DETECTED NOT DETECTED Final   Staphylococcus species DETECTED (A) NOT DETECTED Final    Comment: CRITICAL RESULT CALLED TO, READ BACK BY AND VERIFIED WITH: P CLARK,RN AT [id number] 12/05/15 BY L BENFIELD    Staphylococcus aureus DETECTED (A) NOT DETECTED Final    Comment: CRITICAL RESULT CALLED TO, READ BACK BY AND VERIFIED WITH: P CLARK,RN AT [id number] 12/05/15 BY L BENFIELD    Methicillin resistance NOT DETECTED NOT DETECTED Final   Streptococcus species NOT DETECTED NOT DETECTED Final   Streptococcus agalactiae NOT DETECTED NOT DETECTED Final   Streptococcus pneumoniae NOT DETECTED NOT DETECTED Final   Streptococcus pyogenes NOT DETECTED NOT DETECTED Final   Acinetobacter baumannii NOT DETECTED NOT DETECTED Final   Enterobacteriaceae species NOT DETECTED NOT DETECTED Final   Enterobacter cloacae complex NOT DETECTED NOT DETECTED Final   Escherichia coli NOT DETECTED NOT DETECTED Final   Klebsiella oxytoca NOT DETECTED NOT DETECTED Final   Klebsiella pneumoniae NOT DETECTED NOT DETECTED Final   Proteus species NOT DETECTED NOT DETECTED Final   Serratia marcescens  NOT DETECTED NOT DETECTED Final   Haemophilus influenzae NOT DETECTED NOT DETECTED Final   Neisseria meningitidis NOT DETECTED NOT DETECTED Final   Pseudomonas aeruginosa NOT DETECTED NOT DETECTED Final   Candida albicans NOT DETECTED NOT DETECTED Final   Candida glabrata NOT DETECTED NOT DETECTED Final   Candida krusei NOT DETECTED NOT DETECTED Final   Candida parapsilosis NOT DETECTED NOT DETECTED Final   Candida tropicalis NOT DETECTED NOT DETECTED Final    Comment: Performed at [facility]  Culture, blood (routine x 2)     Status: Abnormal   Collection Time: 12/04/15  8:40 PM  Result Value Ref  Range Status   Specimen Description BLOOD RIGHT HAND  Final   Special Requests BOTTLES DRAWN AEROBIC AND ANAEROBIC 5CC  Final   Culture  Setup Time   Final    GRAM POSITIVE COCCI IN CLUSTERS IN BOTH AEROBIC AND ANAEROBIC BOTTLES CRITICAL RESULT CALLED TO, READ BACK BY AND VERIFIED WITH: P CLARK,RN AT [id number] 12/05/15 BY L BENFIELD    Culture (A)  Final    STAPHYLOCOCCUS AUREUS SUSCEPTIBILITIES PERFORMED ON PREVIOUS CULTURE WITHIN THE LAST 5 DAYS. Performed at [facility]    Report Status 12/07/2015 FINAL  Final  MRSA PCR Screening     Status: None   Collection Time: 12/05/15  3:38 PM  Result Value Ref Range Status   MRSA by PCR NEGATIVE NEGATIVE Final    Comment:        The GeneXpert MRSA Assay (FDA approved for NASAL specimens only), is one component of a comprehensive MRSA colonization surveillance program. It is not intended to diagnose MRSA infection nor to guide or monitor treatment for MRSA infections.   Culture, blood (Routine X 2) w Reflex to ID Panel     Status: Abnormal (Preliminary result)   Collection Time: 12/07/15  5:30 PM  Result Value Ref Range Status   Specimen Description BLOOD RIGHT HAND  Final   Special Requests IN PEDIATRIC BOTTLE 1CC  Final   Culture  Setup Time   Final    GRAM POSITIVE COCCI IN CLUSTERS IN PEDIATRIC BOTTLE CRITICAL VALUE  NOTED.  VALUE IS CONSISTENT WITH PREVIOUSLY REPORTED AND CALLED VALUE.    Culture STAPHYLOCOCCUS AUREUS (A)  Final   Report Status PENDING  Incomplete  Culture, blood (Routine X 2) w Reflex to ID Panel     Status: Abnormal (Preliminary result)   Collection Time: 12/07/15  5:30 PM  Result Value Ref Range Status   Specimen Description BLOOD LEFT HAND  Final   Special Requests IN PEDIATRIC BOTTLE 2CC  Final   Culture  Setup Time   Final    GRAM POSITIVE COCCI IN CLUSTERS IN PEDIATRIC BOTTLE CRITICAL RESULT CALLED TO, READ BACK BY AND VERIFIED WITH: Leonie Green Pharm.D. 11:20 12/08/15 ([email or web address])    Culture STAPHYLOCOCCUS AUREUS (A)  Final   Report Status PENDING  Incomplete  Blood Culture ID Panel (Reflexed)     Status: Abnormal   Collection Time: 12/07/15  5:30 PM  Result Value Ref Range Status   Enterococcus species NOT DETECTED NOT DETECTED Final   Listeria monocytogenes NOT DETECTED NOT DETECTED Final   Staphylococcus species DETECTED (A) NOT DETECTED Final    Comment: CRITICAL RESULT CALLED TO, READ BACK BY AND VERIFIED WITH: M. Vonita Moss.D. 11:20 12/08/15 ([email or web address])    Staphylococcus aureus DETECTED (A) NOT DETECTED Final    Comment: CRITICAL RESULT CALLED TO, READ BACK BY AND VERIFIED WITH: M. Radford Pax Pharm.D. 11:20 12/08/15 ([email or web address])    Methicillin resistance NOT DETECTED NOT DETECTED Final   Streptococcus species NOT DETECTED NOT DETECTED Final   Streptococcus agalactiae NOT DETECTED NOT DETECTED Final   Streptococcus pneumoniae NOT DETECTED NOT DETECTED Final   Streptococcus pyogenes NOT DETECTED NOT DETECTED Final   Acinetobacter baumannii NOT DETECTED NOT DETECTED Final   Enterobacteriaceae species NOT DETECTED NOT DETECTED Final   Enterobacter cloacae complex NOT DETECTED NOT DETECTED Final   Escherichia coli NOT DETECTED NOT DETECTED Final   Klebsiella oxytoca NOT DETECTED NOT DETECTED Final   Klebsiella pneumoniae NOT DETECTED NOT DETECTED Final   Proteus species NOT  DETECTED NOT DETECTED  Final   Serratia marcescens NOT DETECTED NOT DETECTED Final   Haemophilus influenzae NOT DETECTED NOT DETECTED Final   Neisseria meningitidis NOT DETECTED NOT DETECTED Final   Pseudomonas aeruginosa NOT DETECTED NOT DETECTED Final   Candida albicans NOT DETECTED NOT DETECTED Final   Candida glabrata NOT DETECTED NOT DETECTED Final   Candida krusei NOT DETECTED NOT DETECTED Final   Candida parapsilosis NOT DETECTED NOT DETECTED Final   Candida tropicalis NOT DETECTED NOT DETECTED Final    Studies/Results: No results found.    Assessment/Plan:  INTERVAL HISTORY: TEE with LARGE vegetation on TWO leaflets of TV   Principal Problem:   Bacteremia Active Problems:   Benzodiazepine dependence (HCC)   IVDU (intravenous drug user)   Viral hepatitis B chronic (HCC)   Leukocytosis   Hyponatremia   Thrombocytopenia (HCC)   Chest pain   Staphylococcus aureus bacteremia   Septic pulmonary embolism (HCC)   Hypokalemia   Acute bacterial endocarditis    ALVERTO NONAKA is a 20 y.o. male with 20 y.o. 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 MSSA right-sided endocarditis:                                             [facility] Management Team Staphylococcus aureus bacteremia  Staphylococcus aureus bacteremia (SAB) is associated with a high rate of complications and mortality.  Specific aspects of clinical management are critical to optimizing the outcome of patients with SAB.  Therefore, the [facility] Antimicrobial Management Team [facility]) has initiated an intervention aimed at improving the management of SAB at [facility].  To do so, Infectious Diseases physicians are providing an evidence-based consult for the management of all patients with SAB.     Yes No Comments  Perform follow-up blood cultures (even if the patient is afebrile) to ensure  clearance of bacteremia [x]   BLOOD CULTURES WERE FINALLY REPEATED TODAY (I ORDERED YESTERDAY AS WELL)  Remove vascular catheter and obtain follow-up blood cultures after the removal of the catheter []  []  DO NOT PLACE PICC LINE OR CENTRAL LINE UNTIL CULTURES FROM TODAY ARE NO GROWTH X 4-5 DAYS  Perform echocardiography to evaluate for endocarditis (transthoracic ECHO is 40-50% sensitive, TEE is > 90% sensitive) []  []  TEE with large vegetation on 2 leaflets of TV       Ensure source control []  []  Have all abscesses been drained effectively? Have deep seeded infections (septic joints or osteomyelitis) had appropriate surgical debridement? Source seems to be his injection drug use   Investigate for "metastatic" sites of infection []  []  Does the patient have ANY symptom or physical exam finding that would suggest a deeper infection (back or neck pain that may be suggestive of vertebral osteomyelitis or epidural abscess, muscle pain that could be a symptom of pyomyositis)?  Keep in mind that for deep seeded infections MRI imaging with contrast is preferred rather than other often insensitive tests such as plain x-rays, especially early in a patient's presentation.   No clinical signs pointing to and others metastatic site of infection besides the lungs at this point   Change antibiotic therapy to Vancomycin given his severe penicillin allergy and given the fact that we have not any history or color documented history of him having tolerated cephalosporins  [  x] []  Beta-lactam antibiotics are preferred for MSSA due to higher cure rates.   If on Vancomycin, goal trough should be 15 - 20 mcg/mL  Estimated duration of IV antibiotic therapy:  6 weeks but would need to be in a skilled nursing facility  [x]  []  Consult case management for probably prolonged outpatient IV antibiotic therapy    PLEASE CONSULT CARDIOLOGY AND CARDIOTHORACIC SURGERY FORMALLY   I DOUBT HE WILL END UP HAVING VALVE REPLACEMENT BUT  VEGETATION IS LARGE and we also agreed as part of Endocarditis task force to have these pts seen by multiple disciplines  #2 Chronic hepatitis be without hepatic coma: I would are you that there is a good reason to consider treating him with anti-hepatitis B therapy in particular if there is any suspicion of shared needles as he could spread hep B in the community if he shares his needles with fellow IV drug users. He denied using shared needles but it is something to consider. I don't think he needs screening for hepatocellular carcinoma at this young age  #3 Intravenous drug use: Clearly he needs to being gauged in a opiate replacement plan to treat his injection drug use as are hisa for a to administer intravenous drugs and to avoid further episodes of infectious endocarditis from injection drug use. He says he did not like methadone at all suboxone was "ok"   #4 cracked tooth and problems with dentition: Consider Panorex and he will need to be plugged into the dental as an outpatient. Proper dental hygiene is clearly also critical given his now documented history of endocarditis he will also need antecedent antibiotics prior to invasive dental procedures.  I updated Jayven on results of TEE and his father Richardson Landry after results were back.    LOS: 4 days   Erik [name] 12/09/2015, 2:58 PM

## 2015-12-09 NOTE — Anesthesia Postprocedure Evaluation (Signed)
Anesthesia Post Note  Patient: Erik [name]  Procedure(s) Performed: Procedure(s) (LRB): TRANSESOPHAGEAL ECHOCARDIOGRAM (TEE) (N/A)  Patient location during evaluation: Endoscopy Anesthesia Type: MAC Level of consciousness: awake, awake and alert and oriented Pain management: pain level controlled Vital Signs Assessment: post-procedure vital signs reviewed and stable Respiratory status: spontaneous breathing, nonlabored ventilation and respiratory function stable Cardiovascular status: blood pressure returned to baseline Anesthetic complications: no    Last Vitals:  Vitals:   12/09/15 1315 12/09/15 1445  BP: 130/66 118/78  Pulse: 75 90  Resp: (!) 24 16  Temp:  36.9 C    Last Pain:  Vitals:   12/09/15 1654  TempSrc:   PainSc: 4                  Deborah Lazcano COKER

## 2015-12-09 NOTE — Consult Note (Signed)
[address]       [place] [address]             [phone]        Santonio Y Zipper [facility] Medical Record [id number] Date of Birth: 26-Sep-1995   Primary Care: Gwendolyn Grant, MD Requesting Physician: Dr. Martinique  Chief Complaint:   Fever, chills, shortness of breath, chest pain   History of Present Illness:     This is a 20 year old male with a history of Hepatitis B,  IVDA, non-STEMI with troponin greater than 65 in setting of rhabdomyolysis 02/2015 who admitted by Dr. Carles Collet on 12/08/2015.  More recently, he presented to the ER on 12/04/2015 with complaints of chest pain and shortness of breath. Apparently he had a set of blood cultures showed gram positive cocci and Staph Aureus. He was given Levofloxacin and discharged. According to the patient, he last used heroin about 2 weeks ago. He has had a number of previous admissions in the past for narcotic and other illicit drug abuse.  An infectious disease consult was obtained with Dr. Tommy Medal. His repeat blood cultures are still growing methicillin sensitive Staphylococcus aureus. He was started on IV Vancomycin. Echo done yesterday showed an LVEF 50-55%, a 2 cm vegetation on the tricuspid valve. CT angiogram showed  numerous nodular consolidations throughout the periphery of both lungs, some of which have central cavitations, largest in the right lower lobe measuring 2.3 cm greatest dimension (likely septic emboli), no PE, and a small left pleural effusion. A TEE was done earlier that showed a large vegatation involving 2 of the leaflets of the TV;at least, moderate TR. There was no LA thrombus or PFO. A cardiothoracic consultation has been requested with Dr. Roxy Manns because of TV vegetation and TR. Currently, the patient is no acute distress and vital signs are stable. Mot  Current Activity/ Functional Status: Patient is independent with mobility/ambulation, transfers, ADL's, IADL's.   Zubrod Score: At the  time of surgery this patient's most appropriate activity status/level should be described as: []     0    Normal activity, no symptoms [x]     1    Restricted in physical strenuous activity but ambulatory, able to do out light work []     2    Ambulatory and capable of self care, unable to do work activities, up and about                 more than 50%  Of the time                            []     3    Only limited self care, in bed greater than 50% of waking hours []     4    Completely disabled, no self care, confined to bed or chair []     5    Moribund  Past Medical History:  Diagnosis Date  . Anxiety   . Drug abuse and dependence ([place])    admit with OD 11/2014: rhabdo and AKI, ARF -   . Hepatitis B    chronic viral/hx per notes 12/05/2015  . History of endocarditis    LESCHBER, Shattuck A [[id number]]  . IV drug user    heroine  . NSTEMI (non-ST elevated myocardial infarction) ([place])    Archie Endo 12/05/2015  . Pneumonia 12/04/2015  . Sinusitis     Past Surgical  History:  Procedure Laterality Date  . CARDIAC CATHETERIZATION N/A 02/22/2015   Procedure: Left Heart Cath and Coronary Angiography;  Surgeon: Troy Sine, MD;  Location: [facility];  Service: Cardiovascular;  Laterality: N/A;  . TEE WITHOUT CARDIOVERSION N/A 02/24/2015   Procedure: TRANSESOPHAGEAL ECHOCARDIOGRAM (TEE);  Surgeon: Skeet Latch, MD;  Location: [facility] ENDOSCOPY;  Service: Cardiovascular;  Laterality: N/A;     History  Alcohol Use  . Yes    Comment: 12/05/2015 "don't drink now"    Social History   Social History  . Marital status: Single    Spouse name: N/A  . Number of children: 0  . Years of education: N/A   Occupational History  . Not employed or in school.   Social History Main Topics  . Smoking status: Current Every Day Smoker    Packs/day: 1.00    Types: Cigarettes  . Smokeless tobacco: Never Used     Comment: 12/05/2015 "haven't had a cigarette in 2 wks"  . Alcohol use Yes     Comment:  12/05/2015 "don't drink now"  . Drug use:     Types: Cocaine, Marijuana, IV     Comment: marijuana, heroin  . Sexual activity: Yes     Comment: heroine    Allergies  Allergen Reactions  . Penicillins Anaphylaxis and Swelling    All "-cillins."  Angioedema also  . Sulfa Antibiotics Swelling and Rash    Childhood allergic reaction    Current Facility-Administered Medications  Medication Dose Route Frequency Provider Last Rate Last Dose  . 0.9 %  sodium chloride infusion   Intravenous Continuous Eileen Stanford, PA-C 20 mL/hr at 12/09/15 1440 400 mL at 12/09/15 1440  . acetaminophen (TYLENOL) tablet 650 mg  650 mg Oral Q6H PRN Radene Gunning, NP   650 mg at 12/06/15 [id number]   Or  . acetaminophen (TYLENOL) suppository 650 mg  650 mg Rectal Q6H PRN Radene Gunning, NP      . bisacodyl (DULCOLAX) EC tablet 5 mg  5 mg Oral Daily PRN Radene Gunning, NP      . HYDROcodone-acetaminophen (NORCO/VICODIN) 5-325 MG per tablet 1 tablet  1 tablet Oral Q6H PRN Orson Eva, MD   1 tablet at 12/09/15 1554  . ketorolac (TORADOL) 15 MG/ML injection 15 mg  15 mg Intravenous Q6H PRN Radene Gunning, NP   15 mg at 12/09/15 0124  . ondansetron (ZOFRAN) tablet 4 mg  4 mg Oral Q6H PRN Radene Gunning, NP       Or  . ondansetron [facility]) injection 4 mg  4 mg Intravenous Q6H PRN Radene Gunning, NP      . traZODone (DESYREL) tablet 25 mg  25 mg Oral QHS PRN Radene Gunning, NP   25 mg at 12/09/15 0011  . vancomycin (VANCOCIN) 1,500 mg in sodium chloride 0.9 % 500 mL IVPB  1,500 mg Intravenous Q8H Kimberly B Hammons, RPH   1,500 mg at 12/09/15 1440    Prescriptions Prior to Admission  Medication Sig Dispense Refill Last Dose  . levofloxacin (LEVAQUIN) 750 MG tablet Take 1 tablet (750 mg total) by mouth daily. 4 tablet 0 12/04/2015 at Unknown time  . [DISCONTINUED] aspirin EC 81 MG EC tablet Take 1 tablet (81 mg total) by mouth daily. (Patient not taking: Reported on 12/05/2015) 30 tablet 0 Not Taking at Unknown time     Family History  Problem Relation Age of Onset  . Adopted: Yes  Review of Systems:     Cardiac Review of Systems: Y or N  Orthopnea [ N ]   Pedal Edema Aqua.Slicker  ]    Palpitations Aqua.Slicker  ] Syncope  Aqua.Slicker  ]   Presyncope Aqua.Slicker   ]  General Review of Systems: [Y] = yes [N  ]=no Constitional:  nausea [ N ]; night sweats [  N]; fever [ Y ]; or chills [ Y]                                                             Eye : blurred vision [ N ]; diplopia [ N ]; vision changes [ N ];  Amaurosis fugax[ N ]; Resp: cough [ N ];  wheezing[N  ];  hemoptysis[ N ];  GI:  vomiting[ N ];  dysphagia[ N ]; melena[ N ];  hematochezia [ N ];       Heme/Lymph: bruising[ N ];  bleeding[N  ];  anemia[ N ];  Neuro: TIA[N  ];  headaches[  ];  stroke[ N ];   Endocrine: diabetes[N  ];  thyroid dysfunction[N  ];   Physical Exam: BP 118/78 (BP Location: Right Arm)   Pulse 90   Temp 98.5 F (36.9 C) (Oral)   Resp 16   Ht 5\' 6"  (1.676 m)   Wt 129 lb 6.6 oz (58.7 kg)   SpO2 100%   BMI 20.89 kg/m   General appearance: alert, cooperative and no distress Head: Normocephalic, without obvious abnormality, atraumatic Neck: no carotid bruit, no JVD and supple, symmetrical, trachea midline Resp: clear to auscultation bilaterally Cardio: RRR, soft murmur grade I/VI GI: soft, non-tender; bowel sounds normal; no masses,  no organomegaly Extremities: No lower extremity edema, palpable DP/PT. Large tattoo LUE Neurologic: Grossly normal  Diagnostic Studies & Laboratory data:   Recent Radiology Findings:     Transthoracic Echocardiography done on 12/07/2015:  Patient:    Rigley, Falgout MR #:       [id number] Study Date: 12/07/2015 Gender:     M Age:        25 Height:     167.6 cm Weight:     58.7 kg BSA:        1.65 m^2 Pt. Status: Room:       [id number]   ATTENDING    Rama, Christina P  SONOGRAPHER  Johny Chess, RDCS, CCT  ORDERING     Black, Lezlie Octave  PERFORMING   [facility], Inpatient  ADMITTING    Emokpae,  Courage  cc:  ------------------------------------------------------------------- LV EF: 50% -   55%  ------------------------------------------------------------------- Indications:      Dyspnea 786.09.  ------------------------------------------------------------------- History:   PMH:   Angina pectoris.  Risk factors:  IV drug use. Hepatitis B. Bacteremia.  ------------------------------------------------------------------- Study Conclusions  - Left ventricle: The cavity size was normal. Wall thickness was   normal. Systolic function was normal. The estimated ejection   fraction was in the range of 50% to 55%. Wall motion was normal;   there were no regional wall motion abnormalities. - Tricuspid valve: Irregular echodensity noted on atrial side of   tricuspid valve consistent with vegetation and clinical diagnosis   of endocarditis given history of bacteremia and IV drug use.   Longest dimension measures nearly 2 cm.  There was moderate   regurgitation. - Pulmonary arteries: PA peak pressure: 25 mm Hg (S). - Pericardium, extracardiac: There was no pericardial effusion.  Impressions:  - Normal LV wall thickness with LVEF 50-55%. Normal diastolic   function. Irregular echodensity noted on atrial side of tricuspid   valve consistent with vegetation and clinical diagnosis of   endocarditis given history of bacteremia and IV drug use. Longest   dimension measures nearly 2 cm. Moderate tricuspid regurgitation   noted with PASP estimated 25 mmHg.  ------------------------------------------------------------------- Study data:  Comparison was made to the study of 02/22/2015.  Study status:  Routine.  Procedure:  The patient&'s pain level was 8 on a scale of 1 to 10. Transthoracic echocardiography. Image quality was good.  Study completion:  There were no complications. Transthoracic echocardiography.  M-mode, complete 2D, spectral Doppler, and color Doppler.   Birthdate:  Patient birthdate: [birth year].  Age:  Patient is 20 yr old.  Sex:  Gender: male. BMI: 20.9 kg/m^2.  Blood pressure:     108/67  Patient status: Inpatient.  Study date:  Study date: 12/07/2015. Study time: 03:37 PM.  Location:  Echo laboratory.  -------------------------------------------------------------------  ------------------------------------------------------------------- Left ventricle:  The cavity size was normal. Wall thickness was normal. Systolic function was normal. The estimated ejection fraction was in the range of 50% to 55%. Wall motion was normal; there were no regional wall motion abnormalities.  ------------------------------------------------------------------- Aortic valve:   Trileaflet.  Doppler:  There was no significant regurgitation.  ------------------------------------------------------------------- Aorta:  Aortic root: The aortic root was normal in size.  ------------------------------------------------------------------- Mitral valve:   The valve appears to be grossly normal.    Doppler:  There was no significant regurgitation.    Peak gradient (D): 4 mm Hg.  ------------------------------------------------------------------- Left atrium:  The atrium was normal in size.  ------------------------------------------------------------------- Right ventricle:  The cavity size was normal. Systolic function was normal.  ------------------------------------------------------------------- Pulmonic valve:    The valve appears to be grossly normal. Doppler:  There was no significant regurgitation.  ------------------------------------------------------------------- Tricuspid valve:  Irregular echodensity noted on atrial side of tricuspid valve consistent with vegetation and clinical diagnosis of endocarditis given history of bacteremia and IV drug use. Longest dimension measures nearly 2 cm.  Doppler:  There was moderate  regurgitation.  ------------------------------------------------------------------- Right atrium:  The atrium was normal in size.  ------------------------------------------------------------------- Pericardium:  There was no pericardial effusion.  ------------------------------------------------------------------- Systemic veins: Inferior vena cava: The vessel was normal in size. The respirophasic diameter changes were in the normal range (>= 50%), consistent with normal central venous pressure.  TEE done by Dr. Acie Fredrickson on 12/09/2015:  Left Ventrical:  Normal LV   Mitral Valve: normal   Aortic Valve: normal  Tricuspid Valve: large vegatation involving 2 of the leaflets.   At leads moderate TR  Pulmonic Valve: normal   Left Atrium/ Left atrial appendage: no thrombus   Atrial septum: no PFO by color doppler   Aorta: normal     I have independently reviewed the above radiologic studies.  Recent Lab Findings: Lab Results  Component Value Date   WBC 12.7 (H) 12/08/2015   HGB 15.1 12/08/2015   HCT 45.3 12/08/2015   PLT 207 12/08/2015   GLUCOSE 89 12/09/2015   CHOL 138 02/23/2015   TRIG 173 (H) 02/23/2015   HDL 32 (L) 02/23/2015   LDLCALC 71 02/23/2015   ALT 23 12/05/2015   AST 26 12/05/2015   NA 136 12/09/2015   K 4.2 12/09/2015   CL  107 12/09/2015   CREATININE 0.63 12/09/2015   BUN 7 12/09/2015   CO2 22 12/09/2015   INR 1.17 10/31/2015   HGBA1C 5.5 12/05/2015   Assessment / Plan:     1. 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 Await repeat blood cultures. Likely needs at least 6 weeks of IV antibiotics and repeat echo later. No surgical intervention at this time. 2. IVDA-patient states he has tried rehab in the past. Had a discussion with him about importance of cessation. 3. Chronic hepatitis B 4. Broken tooth right upper jaw   Lars Pinks PA-C 12/09/2015 5:43 PM   I have seen and  examined the patient and agree with the assessment as outlined above.  Patient is a 20 year old male with long history of intravenous drug abuse associated with numerous hospitalizations and a history of chronic hepatitis B who has been admitted to the hospital with acute bacterial endocarditis secondary to methicillin sensitive Staphylococcus aureus.  I have personally reviewed the patient's transesophageal echocardiogram and CT scan. The patient has an obvious large vegetation adherent to the tricuspid valve with at least moderate tricuspid regurgitation. There is no sign of annular abscess formation or leaflet perforation. No other significant abnormalities are noted and in particular there appears to be normal left and right ventricular size and function. There is no patent foramen ovale.  There are no signs or symptoms of right heart failure or significant AV conduction delay to suggest the development of heart block.  The patient has septic embolization to both lungs on CT scan with no complicating features.  Under the circumstances there are no clear indications for surgical intervention of any kind at this time. I had a long discussion with the patient and his father at the bedside. We discussed the nature of his current acute illness, indications for surgical intervention, and long-term prognosis both with and without successful rehabilitation and treatment for his underlying problem with intravenous drug abuse. The patient understands that under the current circumstances he would not be considered a candidate for elective surgical intervention of any type unless he were willing to commit to long-term treatment and rehabilitation.  I have answered all of the patient's questions and all the questions posed by his father.  Please call if we can be of further assistance.   I spent in excess of 60 minutes during the conduct of this hospital encounter and >50% of this time involved direct face-to-face  encounter with the patient for counseling and/or coordination of their care.   Rexene Alberts, MD 12/09/2015 7:10 PM

## 2015-12-09 NOTE — CV Procedure (Signed)
    Transesophageal Echocardiogram Note  Erik [name] [id number] [birth year]  Procedure: Transesophageal Echocardiogram Indications: bacteremia   Procedure Details Consent: Obtained Time Out: Verified patient identification, verified procedure, site/side was marked, verified correct patient position, special equipment/implants available, Radiology Safety Procedures followed,  medications/allergies/relevent history reviewed, required imaging and test results available.  Performed  Medications:  During this procedure the patient is administered a total of Propofol 244 mg via drip from anesthesiaology   to achieve and maintain moderate   sedation.  The patient's heart rate, blood pressure, and oxygen saturation are monitored continuously during the procedure.    Left Ventrical:  Normal LV   Mitral Valve: normal   Aortic Valve: normal  Tricuspid Valve: large vegatation involving 2 of the leaflets.   At leads moderate TR  Pulmonic Valve: normal   Left Atrium/ Left atrial appendage: no thrombus   Atrial septum: no PFO by color doppler   Aorta: normal    Complications: No apparent complications Patient did not tolerate procedure well.    Impression:  Tricuspid valve endocarditis.   [name], [name]., MD, [facility] 12/09/2015, 12:21 PM

## 2015-12-09 NOTE — Interval H&P Note (Signed)
History and Physical Interval Note:  12/09/2015 11:58 AM  Erik [name]  has presented today for surgery, with the diagnosis of ENDOCARDITIS  The various methods of treatment have been discussed with the patient and family. After consideration of risks, benefits and other options for treatment, the patient has consented to  Procedure(s): TRANSESOPHAGEAL ECHOCARDIOGRAM (TEE) (N/A) as a surgical intervention .  The patient's history has been reviewed, patient examined, no change in status, stable for surgery.  I have reviewed the patient's chart and labs.  Questions were answered to the patient's satisfaction.     Mertie Moores

## 2015-12-09 NOTE — Progress Notes (Signed)
PROGRESS NOTE  Erik [name] [id number] DOB: 1995-07-17 DOA: 12/05/2015 PCP: Gwendolyn Grant, MD  Brief History: 20 year old male with a history of intravenous drug use, anxiety, endocarditis, chronic hepatitis B presented after being informed of positive blood cultures that was obtained on 12/04/2015. Preliminary results show Staphylococcus aureus in 2 out of 2 sets. The patient initially presented to the emergency department on 12/04/2015 with chest pain and shortness of breath. The patient was given a dose of levofloxacin and discharged from the emergency department after blood cultures were obtained. The patient states that he last used heroin approximately 2 weeks prior to this admission. during his admission from 02/22/2015 through 02/26/2015,, the patient was admitted for treatment of rhabdomyolysis, cellulitisof bilateral feet, and elevated troponins. The patient also has had a previous admission back in September 2016 for narcotic induced respiratory failure requiring intubation. In addition, the patient has had a number of previous admissions in the past for narcotic and other illicit drug overdose.The patient was started on intravenous vancomycin and admitted for further evaluation of his bacteremia.  Assessment/Plan: MSSAbacteremia/Tricuspid Valve Endocarditis -Etiology =  intravenous drug use -Infectious disease  consulted per protocol -103/17--Surveillance blood cultures--follow -12/09/15--large vegetation involving 2 of tricuspid valve leaflets -pt has anaphylaxis to PCN--->continue vancomycin (Dad will check into whether pt had true anaphylaxis) -per ID request and Endocarditis Taskforce Protocol-->consulted cardiology and thoracic surgery -12/07/15 Echo--EF 50-55%, TV vegetation, moderate TR -12/09/15--discussed with pt's father (employee at [employer])--he is willing to take patient home and provides assurance that further IVDU will not be allowed   Pulmonary  opacities/septic emboli -Secondary to metastatic infection -CT angio chest---patchy consolidations peripheralbilateral lungs largest in RLLincluding cavitary consolidation right apex x 2  Hyponatremia -Secondary to volume depletion -Improvedwith fluid resuscitation  Thrombocytopenia -Repeat HIV--neg -B12--392 -screen hep C--neg -improving with treatment of infection  Chronic hepatitis B infection without coma -Appears clinically compensated at this time -Outpatient follow-up -screen hep C & AFP--neg  Poor Dentition -Orthopantogram per ID  Chest pain -minimally elevated troponin likely vasospasm/demand ischemia -02/22/2015--heart catheterization--normal coronaries -personally reviewed EKG--sinus rhythm, nonspecific T-wave changes -Personally reviewed CXR--vagueparenchymal opacities in the right lung -CTA chest--patchy consolidations peripheralbilateral lungs largest in RLLincluding cavitary consolidation right apex x 2; No PE -norco for pain, will not be giving IV opioids  Hypokalemia -repleted -check mag--2.0    Disposition Plan: Not stable for discharge. Anticipate several days/weeks Family Communication: father updated at bedside 12/09/15--Total time spent 35 minutes.  Greater than 50% spent face to face counseling and coordinating care.   Consultants: ID, Cardiology for TEE  Code Status: FULL   DVT Prophylaxis: SCDs   Procedures: As Listed in Progress Note Above  Antibiotics: None    Subjective: She complains of intermittent left-sided pleuritic chest pain. Denies any headache, fevers, chills, vomiting, diarrhea, abdominal pain. No dysuria or hematuria. No rashes.  Objective: Vitals:   12/09/15 1250 12/09/15 1300 12/09/15 1315 12/09/15 1445  BP: 114/73 127/83 130/66 118/78  Pulse: 85 84 75 90  Resp: 18 14 (!) 24 16  Temp:    98.5 F (36.9 C)  TempSrc:    Oral  SpO2: 100% 100% 100% 100%  Weight:      Height:         Intake/Output Summary (Last 24 hours) at 12/09/15 1609 Last data filed at 12/09/15 1223  Gross per 24 hour  Intake              910 ml  Output              650 ml  Net              260 ml   Weight change:  Exam:   General:  Pt is alert, follows commands appropriately, not in acute distress  HEENT: No icterus, No thrush, No neck mass, [place]/AT  Cardiovascular: RRR, S1/S2, no rubs, no gallops  Respiratory: CTA bilaterally, no wheezing, no crackles, no rhonchi  Abdomen: Soft/+BS, non tender, non distended, no guarding  Extremities: No edema, No lymphangitis, No petechiae, No rashes, no synovitis   Data Reviewed: I have personally reviewed following labs and imaging studies Basic Metabolic Panel:  Recent Labs Lab 12/04/15 1309 12/05/15 1600 12/06/15 0226 12/07/15 0757 12/08/15 1836 12/09/15 0444  NA 127* 129* 131* 136  --  136  K 3.6 3.7 3.9 3.3*  --  4.2  CL 92* 92* 100* 103  --  107  CO2 25 25 23 24   --  22  GLUCOSE 149* 171* 111* 109*  --  89  BUN 10 11 12 8   --  7  CREATININE 0.75 0.74 0.71 0.60*  --  0.63  CALCIUM 8.4* 8.6* 8.3* 8.2*  --  8.5*  MG  --   --   --   --  2.1 2.0   Liver Function Tests:  Recent Labs Lab 12/04/15 1309 12/05/15 1600  AST 28 26  ALT 23 23  ALKPHOS 75 79  BILITOT 0.8 0.5  PROT 7.4 6.8  ALBUMIN 3.3* 2.8*    Recent Labs Lab 12/04/15 1309  LIPASE 17   No results for input(s): AMMONIA in the last 168 hours. Coagulation Profile: No results for input(s): INR, PROTIME in the last 168 hours. CBC:  Recent Labs Lab 12/04/15 1309 12/05/15 1600 12/06/15 0226 12/07/15 0757 12/08/15 1234  WBC 10.1 13.6* 9.2 12.9* 12.7*  NEUTROABS  --  10.6*  --   --   --   HGB 14.0 13.3 14.0 12.6* 15.1  HCT 40.5 38.4* 40.9 37.6* 45.3  MCV 80.0 80.5 82.5 81.6 83.1  PLT 77* 92* 86* 158 207   Cardiac Enzymes:  Recent Labs Lab 12/04/15 1309 12/05/15 1828 12/05/15 2336  CKTOTAL 12*  --   --   TROPONINI  --  0.03* <0.03    BNP: Invalid input(s): POCBNP CBG: No results for input(s): GLUCAP in the last 168 hours. HbA1C: No results for input(s): HGBA1C in the last 72 hours. Urine analysis:    Component Value Date/Time   COLORURINE YELLOW 12/05/2015 2053   APPEARANCEUR CLOUDY (A) 12/05/2015 2053   APPEARANCEUR Clear 02/27/2014 0351   LABSPEC 1.019 12/05/2015 2053   LABSPEC 1.025 02/27/2014 0351   PHURINE 6.5 12/05/2015 2053   GLUCOSEU NEGATIVE 12/05/2015 2053   GLUCOSEU Negative 02/27/2014 0351   HGBUR NEGATIVE 12/05/2015 2053   BILIRUBINUR NEGATIVE 12/05/2015 2053   BILIRUBINUR Negative 02/27/2014 0351   KETONESUR NEGATIVE 12/05/2015 2053   PROTEINUR NEGATIVE 12/05/2015 2053   UROBILINOGEN 0.2 11/12/2014 2124   NITRITE NEGATIVE 12/05/2015 2053   LEUKOCYTESUR NEGATIVE 12/05/2015 2053   LEUKOCYTESUR Negative 02/27/2014 0351   Sepsis Labs: @LABRCNTIP (procalcitonin:4,lacticidven:4) ) Recent Results (from the past 240 hour(s))  Culture, blood (routine x 2)     Status: Abnormal   Collection Time: 12/04/15  7:20 PM  Result Value Ref Range Status   Specimen Description BLOOD LEFT ANTECUBITAL  Final   Special Requests BOTTLES DRAWN AEROBIC AND ANAEROBIC 5 CC  Final  Culture  Setup Time   Final    GRAM POSITIVE COCCI IN CLUSTERS IN BOTH AEROBIC AND ANAEROBIC BOTTLES CRITICAL RESULT CALLED TO, READ BACK BY AND VERIFIED WITH: P CLARK,RN AT [id number] 12/05/15 BY L BENFIELD Performed at [facility] (A)  Final   Report Status 12/07/2015 FINAL  Final   Organism ID, Bacteria STAPHYLOCOCCUS AUREUS  Final      Susceptibility   Staphylococcus aureus - MIC*    CIPROFLOXACIN <=0.5 SENSITIVE Sensitive     ERYTHROMYCIN >=8 RESISTANT Resistant     GENTAMICIN <=0.5 SENSITIVE Sensitive     OXACILLIN <=0.25 SENSITIVE Sensitive     TETRACYCLINE <=1 SENSITIVE Sensitive     VANCOMYCIN 1 SENSITIVE Sensitive     TRIMETH/SULFA <=10 SENSITIVE Sensitive     CLINDAMYCIN <=0.25  SENSITIVE Sensitive     RIFAMPIN <=0.5 SENSITIVE Sensitive     Inducible Clindamycin NEGATIVE Sensitive     * STAPHYLOCOCCUS AUREUS  Blood Culture ID Panel (Reflexed)     Status: Abnormal   Collection Time: 12/04/15  7:20 PM  Result Value Ref Range Status   Enterococcus species NOT DETECTED NOT DETECTED Final   Listeria monocytogenes NOT DETECTED NOT DETECTED Final   Staphylococcus species DETECTED (A) NOT DETECTED Final    Comment: CRITICAL RESULT CALLED TO, READ BACK BY AND VERIFIED WITH: P CLARK,RN AT [id number] 12/05/15 BY L BENFIELD    Staphylococcus aureus DETECTED (A) NOT DETECTED Final    Comment: CRITICAL RESULT CALLED TO, READ BACK BY AND VERIFIED WITH: P CLARK,RN AT [id number] 12/05/15 BY L BENFIELD    Methicillin resistance NOT DETECTED NOT DETECTED Final   Streptococcus species NOT DETECTED NOT DETECTED Final   Streptococcus agalactiae NOT DETECTED NOT DETECTED Final   Streptococcus pneumoniae NOT DETECTED NOT DETECTED Final   Streptococcus pyogenes NOT DETECTED NOT DETECTED Final   Acinetobacter baumannii NOT DETECTED NOT DETECTED Final   Enterobacteriaceae species NOT DETECTED NOT DETECTED Final   Enterobacter cloacae complex NOT DETECTED NOT DETECTED Final   Escherichia coli NOT DETECTED NOT DETECTED Final   Klebsiella oxytoca NOT DETECTED NOT DETECTED Final   Klebsiella pneumoniae NOT DETECTED NOT DETECTED Final   Proteus species NOT DETECTED NOT DETECTED Final   Serratia marcescens NOT DETECTED NOT DETECTED Final   Haemophilus influenzae NOT DETECTED NOT DETECTED Final   Neisseria meningitidis NOT DETECTED NOT DETECTED Final   Pseudomonas aeruginosa NOT DETECTED NOT DETECTED Final   Candida albicans NOT DETECTED NOT DETECTED Final   Candida glabrata NOT DETECTED NOT DETECTED Final   Candida krusei NOT DETECTED NOT DETECTED Final   Candida parapsilosis NOT DETECTED NOT DETECTED Final   Candida tropicalis NOT DETECTED NOT DETECTED Final    Comment: Performed at [facility]  Culture, blood (routine x 2)     Status: Abnormal   Collection Time: 12/04/15  8:40 PM  Result Value Ref Range Status   Specimen Description BLOOD RIGHT HAND  Final   Special Requests BOTTLES DRAWN AEROBIC AND ANAEROBIC 5CC  Final   Culture  Setup Time   Final    GRAM POSITIVE COCCI IN CLUSTERS IN BOTH AEROBIC AND ANAEROBIC BOTTLES CRITICAL RESULT CALLED TO, READ BACK BY AND VERIFIED WITH: P CLARK,RN AT [id number] 12/05/15 BY L BENFIELD    Culture (A)  Final    STAPHYLOCOCCUS AUREUS SUSCEPTIBILITIES PERFORMED ON PREVIOUS CULTURE WITHIN THE LAST 5 DAYS. Performed at [facility]    Report Status 12/07/2015 FINAL  Final  MRSA PCR Screening     Status: None   Collection Time: 12/05/15  3:38 PM  Result Value Ref Range Status   MRSA by PCR NEGATIVE NEGATIVE Final    Comment:        The GeneXpert MRSA Assay (FDA approved for NASAL specimens only), is one component of a comprehensive MRSA colonization surveillance program. It is not intended to diagnose MRSA infection nor to guide or monitor treatment for MRSA infections.   Culture, blood (Routine X 2) w Reflex to ID Panel     Status: Abnormal (Preliminary result)   Collection Time: 12/07/15  5:30 PM  Result Value Ref Range Status   Specimen Description BLOOD RIGHT HAND  Final   Special Requests IN PEDIATRIC BOTTLE 1CC  Final   Culture  Setup Time   Final    GRAM POSITIVE COCCI IN CLUSTERS IN PEDIATRIC BOTTLE CRITICAL VALUE NOTED.  VALUE IS CONSISTENT WITH PREVIOUSLY REPORTED AND CALLED VALUE.    Culture STAPHYLOCOCCUS AUREUS (A)  Final   Report Status PENDING  Incomplete  Culture, blood (Routine X 2) w Reflex to ID Panel     Status: Abnormal (Preliminary result)   Collection Time: 12/07/15  5:30 PM  Result Value Ref Range Status   Specimen Description BLOOD LEFT HAND  Final   Special Requests IN PEDIATRIC BOTTLE 2CC  Final   Culture  Setup Time   Final    GRAM POSITIVE COCCI IN CLUSTERS IN PEDIATRIC  BOTTLE CRITICAL RESULT CALLED TO, READ BACK BY AND VERIFIED WITH: Leonie Green Pharm.D. 11:20 12/08/15 ([email or web address])    Culture STAPHYLOCOCCUS AUREUS (A)  Final   Report Status PENDING  Incomplete  Blood Culture ID Panel (Reflexed)     Status: Abnormal   Collection Time: 12/07/15  5:30 PM  Result Value Ref Range Status   Enterococcus species NOT DETECTED NOT DETECTED Final   Listeria monocytogenes NOT DETECTED NOT DETECTED Final   Staphylococcus species DETECTED (A) NOT DETECTED Final    Comment: CRITICAL RESULT CALLED TO, READ BACK BY AND VERIFIED WITH: M. Vonita Moss.D. 11:20 12/08/15 ([email or web address])    Staphylococcus aureus DETECTED (A) NOT DETECTED Final    Comment: CRITICAL RESULT CALLED TO, READ BACK BY AND VERIFIED WITH: M. Radford Pax Pharm.D. 11:20 12/08/15 ([email or web address])    Methicillin resistance NOT DETECTED NOT DETECTED Final   Streptococcus species NOT DETECTED NOT DETECTED Final   Streptococcus agalactiae NOT DETECTED NOT DETECTED Final   Streptococcus pneumoniae NOT DETECTED NOT DETECTED Final   Streptococcus pyogenes NOT DETECTED NOT DETECTED Final   Acinetobacter baumannii NOT DETECTED NOT DETECTED Final   Enterobacteriaceae species NOT DETECTED NOT DETECTED Final   Enterobacter cloacae complex NOT DETECTED NOT DETECTED Final   Escherichia coli NOT DETECTED NOT DETECTED Final   Klebsiella oxytoca NOT DETECTED NOT DETECTED Final   Klebsiella pneumoniae NOT DETECTED NOT DETECTED Final   Proteus species NOT DETECTED NOT DETECTED Final   Serratia marcescens NOT DETECTED NOT DETECTED Final   Haemophilus influenzae NOT DETECTED NOT DETECTED Final   Neisseria meningitidis NOT DETECTED NOT DETECTED Final   Pseudomonas aeruginosa NOT DETECTED NOT DETECTED Final   Candida albicans NOT DETECTED NOT DETECTED Final   Candida glabrata NOT DETECTED NOT DETECTED Final   Candida krusei NOT DETECTED NOT DETECTED Final   Candida parapsilosis NOT DETECTED NOT DETECTED Final   Candida tropicalis NOT  DETECTED NOT DETECTED Final     Scheduled Meds: . vancomycin  1,500 mg Intravenous Q8H   Continuous Infusions: .  sodium chloride 400 mL (12/09/15 1440)    Procedures/Studies: Dg Chest 2 View  Result Date: 12/04/2015 CLINICAL DATA:  Left-sided chest pain, shortness of breath, former smoking history EXAM: CHEST  2 VIEW COMPARISON:  Chest x-ray of 02/22/2015 FINDINGS: There are vague parenchymal opacities scattered throughout the right lung. In this young patient an infectious or inflammatory process would seem most likely. No definite opacity is seen throughout the left lung. There are somewhat prominent perihilar markings better seen on the lateral view which may indicate bronchitis. Mediastinal and hilar contours are unremarkable with no evidence of hilar or adenopathy. The heart is within normal limits in size. No bony abnormality is seen. IMPRESSION: Patchy parenchymal opacities throughout the right lung most likely inflammatory or infectious in etiology. Recommend followup. No adenopathy. Also question bronchitis. Electronically Signed   By: Ivar Drape M.D.   On: 12/04/2015 14:29   Ct Angio Chest Pe W Or Wo Contrast  Result Date: 12/06/2015 CLINICAL DATA:  Left-sided chest pain for 2-3 days. Staph aureus bacteremia with chest pain. Clinical suspicion of septic emboli. Additional history of drug abuse/dependence, IV drug abuser, history of endocarditis, hepatitis B. EXAM: CT ANGIOGRAPHY CHEST WITH CONTRAST TECHNIQUE: Multidetector CT imaging of the chest was performed using the standard protocol during bolus administration of intravenous contrast. Multiplanar CT image reconstructions and MIPs were obtained to evaluate the vascular anatomy. CONTRAST:  100 cc Isovue 370 COMPARISON:  Chest x-ray dated 12/04/2015. FINDINGS: Cardiovascular: The majority of the most peripheral segmental and subsegmental pulmonary arteries cannot be definitively characterized due to patient breathing motion artifact but  there is no pulmonary embolism identified within the main, lobar or central segmental pulmonary arteries bilaterally. Heart size is normal. No pericardial effusion. Thoracic aorta is normal. No aortic aneurysm or dissection. Mediastinum/Nodes: Scattered small lymph nodes within the mediastinum, perihilar and axillary regions, likely reactive in nature. Lungs/Pleura: Patchy nodular consolidations are seen throughout the periphery of both lungs, largest in the right lower lobe measuring 2.3 cm greatest dimension, including a cavitary consolidation at the right lung apex laterally which measures 1 9 cm and an additional cavitary consolidation in the right lung apex anteriorly which measures 1.2 cm. Larger denser consolidation at the left lung base is likely atelectasis, less likely pneumonia. Additional patchy atelectasis noted at the right lung base. Small left pleural effusion. No pneumothorax. Upper Abdomen: Limited images of the upper abdomen are unremarkable. Musculoskeletal: No chest wall abnormality. No acute or significant osseous findings. Review of the MIP images confirms the above findings. IMPRESSION: 1. Numerous nodular consolidations throughout the periphery of both lungs, some of which have central cavitations, largest in the right lower lobe measuring 2.3 cm greatest dimension. Differential includes atypical infection (such as fungal pneumonia) and septic emboli. Favor septic emboli. 2. Additional denser consolidation at the left lung base is most likely atelectasis, less likely pneumonia. 3. Small left pleural effusion. 4. No pulmonary embolism, with mild study limitations detailed above. 5. Heart size is upper normal.  No pericardial effusion. Electronically Signed   By: Franki Cabot M.D.   On: 12/06/2015 18:51    Redell Bhandari, DO  [facility] Pager [phone]  If 7PM-7AM, please contact night-coverage [email or web address] Password [email or web address] 12/09/2015, 4:09 PM   LOS: 4 days

## 2015-12-09 NOTE — Anesthesia Procedure Notes (Signed)
Procedure Name: MAC Date/Time: 12/09/2015 12:06 PM Performed by: Rush Farmer E Pre-anesthesia Checklist: Patient identified, Emergency Drugs available, Suction available and Patient being monitored Patient Re-evaluated:Patient Re-evaluated prior to inductionOxygen Delivery Method: Nasal cannula Intubation Type: IV induction Placement Confirmation: positive ETCO2 Dental Injury: Teeth and Oropharynx as per pre-operative assessment

## 2015-12-09 NOTE — H&P (View-Only) (Signed)
PROGRESS NOTE  Erik [name] [id number] DOB: 10-02-1995 DOA: 12/05/2015 PCP: Gwendolyn Grant, MD  Brief History: 21 year old male with a history of intravenous drug use, anxiety, endocarditis, chronic hepatitis B presented after being informed of positive blood cultures that was obtained on 12/04/2015. Preliminary results show Staphylococcus aureus in 2 out of 2 sets. The patient initially presented to the emergency department on 12/04/2015 with chest pain and shortness of breath. The patient was given a dose of levofloxacin and discharged from the emergency department after blood cultures were obtained. The patient states that he last used heroin approximately 2 weeks prior to this admission.  during his admission from 02/22/2015 through 02/26/2015,, the patient was admitted for treatment of rhabdomyolysis, cellulitis of bilateral feet, and elevated troponins. The patient also has had a previous admission back in September 2016 for narcotic induced respiratory failure requiring intubation. In addition, the patient has had a number of previous admissions in the past for narcotic and other illicit drug overdose.The patient was started on intravenous vancomycin and admitted for further evaluation of his bacteremia.  Assessment/Plan: MSSA bacteremia/Tricuspid Valve Endocarditis -Etiology =  intravenous drug use -Infectious disease  consulted per protocol -Surveillance blood cultures--could not obtain -Already requested TEE--will be done 10/3 -pt has anaphylaxis to PCN--->continue vancomycin -12/07/15 Echo--EF 50-55%, TV vegetation, moderate TR  Pulmonary opacities/septic emboli -Secondary to metastatic infection -CT angio chest---patchy consolidations peripheral bilateral lungs largest in RLL including cavitary consolidation right apex x 2  Hyponatremia -Secondary to volume depletion -Improved with fluid resuscitation  Thrombocytopenia -Repeat HIV--pending -Likely due  to chronic liver disease secondary to chronic hepatitis B -B12--392 -screen hep C -improving with treatment of infection  Chronic hepatitis B infection without coma -Appears clinically compensated at this time -Outpatient follow-up -screen hep C & AFP--neg  Chest pain -minimally elevated troponin likely vasospasm/demand ischemia -02/22/2015--heart catheterization--normal coronaries -personally reviewed EKG--sinus rhythm, nonspecific T-wave changes -Personally reviewed CXR--vagueparenchymal opacities in the right lung -CTA chest--patchy consolidations peripheral bilateral lungs largest in RLL including cavitary consolidation right apex x 2; No PE -norco for pain, will not be giving IV opioids  Hypokalemia -repleted -check mag     Disposition Plan: Not stable for discharge. Anticipate several days/weeks Family Communication: No Family at bedside--Total time spent 35 minutes.  Greater than 50% spent face to face counseling and coordinating care.   Consultants: ID, Cardiology for TEE  Code Status: FULL   DVT Prophylaxis: SCDs   Procedures: As Listed in Progress Note Above  Antibiotics: None   Subjective: Still c/o left sided chest pain--somewhat pleuritic in nature but also reproducible on examination. Denies fevers, chills, nausea, vomiting, diarrhea, abdominal pain. No dysuria or hematuria. No rashes.  Objective: Vitals:   12/07/15 0439 12/07/15 1315 12/08/15 0533 12/08/15 1409  BP: (!) 100/56 108/67 131/73 125/78  Pulse: 96 90 73 96  Resp: 16 16 18 15   Temp: 99.2 F (37.3 C) 98.8 F (37.1 C) 98.4 F (36.9 C) 97.8 F (36.6 C)  TempSrc: Oral Oral  Oral  SpO2: 97% 100% 98% 100%  Weight:      Height:        Intake/Output Summary (Last 24 hours) at 12/08/15 1603 Last data filed at 12/08/15 1013  Gross per 24 hour  Intake              520 ml  Output             1000 ml  Net             -  480 ml   Weight change:  Exam:   General:   Pt is alert, follows commands appropriately, not in acute distress  HEENT: No icterus, No thrush, No neck mass, [place]/AT  Cardiovascular: RRR, S1/S2, no rubs, no gallops  Respiratory: CTA bilaterally, no wheezing, no crackles, no rhonchi  Abdomen: Soft/+BS, non tender, non distended, no guarding  Extremities: No edema, No lymphangitis, No petechiae, No rashes, no synovitis   Data Reviewed: I have personally reviewed following labs and imaging studies Basic Metabolic Panel:  Recent Labs Lab 12/04/15 1309 12/05/15 1600 12/06/15 0226 12/07/15 0757  NA 127* 129* 131* 136  K 3.6 3.7 3.9 3.3*  CL 92* 92* 100* 103  CO2 25 25 23 24   GLUCOSE 149* 171* 111* 109*  BUN 10 11 12 8   CREATININE 0.75 0.74 0.71 0.60*  CALCIUM 8.4* 8.6* 8.3* 8.2*   Liver Function Tests:  Recent Labs Lab 12/04/15 1309 12/05/15 1600  AST 28 26  ALT 23 23  ALKPHOS 75 79  BILITOT 0.8 0.5  PROT 7.4 6.8  ALBUMIN 3.3* 2.8*    Recent Labs Lab 12/04/15 1309  LIPASE 17   No results for input(s): AMMONIA in the last 168 hours. Coagulation Profile: No results for input(s): INR, PROTIME in the last 168 hours. CBC:  Recent Labs Lab 12/04/15 1309 12/05/15 1600 12/06/15 0226 12/07/15 0757 12/08/15 1234  WBC 10.1 13.6* 9.2 12.9* 12.7*  NEUTROABS  --  10.6*  --   --   --   HGB 14.0 13.3 14.0 12.6* 15.1  HCT 40.5 38.4* 40.9 37.6* 45.3  MCV 80.0 80.5 82.5 81.6 83.1  PLT 77* 92* 86* 158 207   Cardiac Enzymes:  Recent Labs Lab 12/04/15 1309 12/05/15 1828 12/05/15 2336  CKTOTAL 12*  --   --   TROPONINI  --  0.03* <0.03   BNP: Invalid input(s): POCBNP CBG: No results for input(s): GLUCAP in the last 168 hours. HbA1C: No results for input(s): HGBA1C in the last 72 hours. Urine analysis:    Component Value Date/Time   COLORURINE YELLOW 12/05/2015 2053   APPEARANCEUR CLOUDY (A) 12/05/2015 2053   APPEARANCEUR Clear 02/27/2014 0351   LABSPEC 1.019 12/05/2015 2053   LABSPEC 1.025 02/27/2014  0351   PHURINE 6.5 12/05/2015 2053   GLUCOSEU NEGATIVE 12/05/2015 2053   GLUCOSEU Negative 02/27/2014 0351   HGBUR NEGATIVE 12/05/2015 2053   BILIRUBINUR NEGATIVE 12/05/2015 2053   BILIRUBINUR Negative 02/27/2014 0351   KETONESUR NEGATIVE 12/05/2015 2053   PROTEINUR NEGATIVE 12/05/2015 2053   UROBILINOGEN 0.2 11/12/2014 2124   NITRITE NEGATIVE 12/05/2015 2053   LEUKOCYTESUR NEGATIVE 12/05/2015 2053   LEUKOCYTESUR Negative 02/27/2014 0351   Sepsis Labs: @LABRCNTIP (procalcitonin:4,lacticidven:4) ) Recent Results (from the past 240 hour(s))  Culture, blood (routine x 2)     Status: Abnormal   Collection Time: 12/04/15  7:20 PM  Result Value Ref Range Status   Specimen Description BLOOD LEFT ANTECUBITAL  Final   Special Requests BOTTLES DRAWN AEROBIC AND ANAEROBIC 5 CC  Final   Culture  Setup Time   Final    GRAM POSITIVE COCCI IN CLUSTERS IN BOTH AEROBIC AND ANAEROBIC BOTTLES CRITICAL RESULT CALLED TO, READ BACK BY AND VERIFIED WITH: P CLARK,RN AT [id number] 12/05/15 BY L BENFIELD Performed at [facility] (A)  Final   Report Status 12/07/2015 FINAL  Final   Organism ID, Bacteria STAPHYLOCOCCUS AUREUS  Final      Susceptibility   Staphylococcus aureus -  MIC*    CIPROFLOXACIN <=0.5 SENSITIVE Sensitive     ERYTHROMYCIN >=8 RESISTANT Resistant     GENTAMICIN <=0.5 SENSITIVE Sensitive     OXACILLIN <=0.25 SENSITIVE Sensitive     TETRACYCLINE <=1 SENSITIVE Sensitive     VANCOMYCIN 1 SENSITIVE Sensitive     TRIMETH/SULFA <=10 SENSITIVE Sensitive     CLINDAMYCIN <=0.25 SENSITIVE Sensitive     RIFAMPIN <=0.5 SENSITIVE Sensitive     Inducible Clindamycin NEGATIVE Sensitive     * STAPHYLOCOCCUS AUREUS  Blood Culture ID Panel (Reflexed)     Status: Abnormal   Collection Time: 12/04/15  7:20 PM  Result Value Ref Range Status   Enterococcus species NOT DETECTED NOT DETECTED Final   Listeria monocytogenes NOT DETECTED NOT DETECTED Final    Staphylococcus species DETECTED (A) NOT DETECTED Final    Comment: CRITICAL RESULT CALLED TO, READ BACK BY AND VERIFIED WITH: P CLARK,RN AT [id number] 12/05/15 BY L BENFIELD    Staphylococcus aureus DETECTED (A) NOT DETECTED Final    Comment: CRITICAL RESULT CALLED TO, READ BACK BY AND VERIFIED WITH: P CLARK,RN AT [id number] 12/05/15 BY L BENFIELD    Methicillin resistance NOT DETECTED NOT DETECTED Final   Streptococcus species NOT DETECTED NOT DETECTED Final   Streptococcus agalactiae NOT DETECTED NOT DETECTED Final   Streptococcus pneumoniae NOT DETECTED NOT DETECTED Final   Streptococcus pyogenes NOT DETECTED NOT DETECTED Final   Acinetobacter baumannii NOT DETECTED NOT DETECTED Final   Enterobacteriaceae species NOT DETECTED NOT DETECTED Final   Enterobacter cloacae complex NOT DETECTED NOT DETECTED Final   Escherichia coli NOT DETECTED NOT DETECTED Final   Klebsiella oxytoca NOT DETECTED NOT DETECTED Final   Klebsiella pneumoniae NOT DETECTED NOT DETECTED Final   Proteus species NOT DETECTED NOT DETECTED Final   Serratia marcescens NOT DETECTED NOT DETECTED Final   Haemophilus influenzae NOT DETECTED NOT DETECTED Final   Neisseria meningitidis NOT DETECTED NOT DETECTED Final   Pseudomonas aeruginosa NOT DETECTED NOT DETECTED Final   Candida albicans NOT DETECTED NOT DETECTED Final   Candida glabrata NOT DETECTED NOT DETECTED Final   Candida krusei NOT DETECTED NOT DETECTED Final   Candida parapsilosis NOT DETECTED NOT DETECTED Final   Candida tropicalis NOT DETECTED NOT DETECTED Final    Comment: Performed at [facility]  Culture, blood (routine x 2)     Status: Abnormal   Collection Time: 12/04/15  8:40 PM  Result Value Ref Range Status   Specimen Description BLOOD RIGHT HAND  Final   Special Requests BOTTLES DRAWN AEROBIC AND ANAEROBIC 5CC  Final   Culture  Setup Time   Final    GRAM POSITIVE COCCI IN CLUSTERS IN BOTH AEROBIC AND ANAEROBIC BOTTLES CRITICAL RESULT CALLED TO,  READ BACK BY AND VERIFIED WITH: P CLARK,RN AT [id number] 12/05/15 BY L BENFIELD    Culture (A)  Final    STAPHYLOCOCCUS AUREUS SUSCEPTIBILITIES PERFORMED ON PREVIOUS CULTURE WITHIN THE LAST 5 DAYS. Performed at [facility]    Report Status 12/07/2015 FINAL  Final  MRSA PCR Screening     Status: None   Collection Time: 12/05/15  3:38 PM  Result Value Ref Range Status   MRSA by PCR NEGATIVE NEGATIVE Final    Comment:        The GeneXpert MRSA Assay (FDA approved for NASAL specimens only), is one component of a comprehensive MRSA colonization surveillance program. It is not intended to diagnose MRSA infection nor to guide or monitor treatment for MRSA infections.  Culture, blood (Routine X 2) w Reflex to ID Panel     Status: None (Preliminary result)   Collection Time: 12/07/15  5:30 PM  Result Value Ref Range Status   Specimen Description BLOOD RIGHT HAND  Final   Special Requests IN PEDIATRIC BOTTLE 1CC  Final   Culture  Setup Time   Final    GRAM POSITIVE COCCI IN CLUSTERS IN PEDIATRIC BOTTLE    Culture GRAM POSITIVE COCCI  Final   Report Status PENDING  Incomplete  Culture, blood (Routine X 2) w Reflex to ID Panel     Status: None (Preliminary result)   Collection Time: 12/07/15  5:30 PM  Result Value Ref Range Status   Specimen Description BLOOD LEFT HAND  Final   Special Requests IN PEDIATRIC BOTTLE 2CC  Final   Culture  Setup Time   Final    GRAM POSITIVE COCCI IN CLUSTERS IN PEDIATRIC BOTTLE Organism ID to follow CRITICAL RESULT CALLED TO, READ BACK BY AND VERIFIED WITH: MVonita Moss.D. 11:20 12/08/15 ([email or web address])    Culture GRAM POSITIVE COCCI  Final   Report Status PENDING  Incomplete  Blood Culture ID Panel (Reflexed)     Status: Abnormal   Collection Time: 12/07/15  5:30 PM  Result Value Ref Range Status   Enterococcus species NOT DETECTED NOT DETECTED Final   Listeria monocytogenes NOT DETECTED NOT DETECTED Final   Staphylococcus species DETECTED (A) NOT  DETECTED Final    Comment: CRITICAL RESULT CALLED TO, READ BACK BY AND VERIFIED WITH: M. Vonita Moss.D. 11:20 12/08/15 ([email or web address])    Staphylococcus aureus DETECTED (A) NOT DETECTED Final    Comment: CRITICAL RESULT CALLED TO, READ BACK BY AND VERIFIED WITH: M. Radford Pax Pharm.D. 11:20 12/08/15 ([email or web address])    Methicillin resistance NOT DETECTED NOT DETECTED Final   Streptococcus species NOT DETECTED NOT DETECTED Final   Streptococcus agalactiae NOT DETECTED NOT DETECTED Final   Streptococcus pneumoniae NOT DETECTED NOT DETECTED Final   Streptococcus pyogenes NOT DETECTED NOT DETECTED Final   Acinetobacter baumannii NOT DETECTED NOT DETECTED Final   Enterobacteriaceae species NOT DETECTED NOT DETECTED Final   Enterobacter cloacae complex NOT DETECTED NOT DETECTED Final   Escherichia coli NOT DETECTED NOT DETECTED Final   Klebsiella oxytoca NOT DETECTED NOT DETECTED Final   Klebsiella pneumoniae NOT DETECTED NOT DETECTED Final   Proteus species NOT DETECTED NOT DETECTED Final   Serratia marcescens NOT DETECTED NOT DETECTED Final   Haemophilus influenzae NOT DETECTED NOT DETECTED Final   Neisseria meningitidis NOT DETECTED NOT DETECTED Final   Pseudomonas aeruginosa NOT DETECTED NOT DETECTED Final   Candida albicans NOT DETECTED NOT DETECTED Final   Candida glabrata NOT DETECTED NOT DETECTED Final   Candida krusei NOT DETECTED NOT DETECTED Final   Candida parapsilosis NOT DETECTED NOT DETECTED Final   Candida tropicalis NOT DETECTED NOT DETECTED Final     Scheduled Meds: . vancomycin  1,500 mg Intravenous Q8H   Continuous Infusions:   Procedures/Studies: Dg Chest 2 View  Result Date: 12/04/2015 CLINICAL DATA:  Left-sided chest pain, shortness of breath, former smoking history EXAM: CHEST  2 VIEW COMPARISON:  Chest x-ray of 02/22/2015 FINDINGS: There are vague parenchymal opacities scattered throughout the right lung. In this young patient an infectious or inflammatory process would seem  most likely. No definite opacity is seen throughout the left lung. There are somewhat prominent perihilar markings better seen on the lateral view which may indicate bronchitis. Mediastinal and hilar contours are unremarkable with no evidence  of hilar or adenopathy. The heart is within normal limits in size. No bony abnormality is seen. IMPRESSION: Patchy parenchymal opacities throughout the right lung most likely inflammatory or infectious in etiology. Recommend followup. No adenopathy. Also question bronchitis. Electronically Signed   By: Ivar Drape M.D.   On: 12/04/2015 14:29   Ct Angio Chest Pe W Or Wo Contrast  Result Date: 12/06/2015 CLINICAL DATA:  Left-sided chest pain for 2-3 days. Staph aureus bacteremia with chest pain. Clinical suspicion of septic emboli. Additional history of drug abuse/dependence, IV drug abuser, history of endocarditis, hepatitis B. EXAM: CT ANGIOGRAPHY CHEST WITH CONTRAST TECHNIQUE: Multidetector CT imaging of the chest was performed using the standard protocol during bolus administration of intravenous contrast. Multiplanar CT image reconstructions and MIPs were obtained to evaluate the vascular anatomy. CONTRAST:  100 cc Isovue 370 COMPARISON:  Chest x-ray dated 12/04/2015. FINDINGS: Cardiovascular: The majority of the most peripheral segmental and subsegmental pulmonary arteries cannot be definitively characterized due to patient breathing motion artifact but there is no pulmonary embolism identified within the main, lobar or central segmental pulmonary arteries bilaterally. Heart size is normal. No pericardial effusion. Thoracic aorta is normal. No aortic aneurysm or dissection. Mediastinum/Nodes: Scattered small lymph nodes within the mediastinum, perihilar and axillary regions, likely reactive in nature. Lungs/Pleura: Patchy nodular consolidations are seen throughout the periphery of both lungs, largest in the right lower lobe measuring 2.3 cm greatest dimension, including  a cavitary consolidation at the right lung apex laterally which measures 1 9 cm and an additional cavitary consolidation in the right lung apex anteriorly which measures 1.2 cm. Larger denser consolidation at the left lung base is likely atelectasis, less likely pneumonia. Additional patchy atelectasis noted at the right lung base. Small left pleural effusion. No pneumothorax. Upper Abdomen: Limited images of the upper abdomen are unremarkable. Musculoskeletal: No chest wall abnormality. No acute or significant osseous findings. Review of the MIP images confirms the above findings. IMPRESSION: 1. Numerous nodular consolidations throughout the periphery of both lungs, some of which have central cavitations, largest in the right lower lobe measuring 2.3 cm greatest dimension. Differential includes atypical infection (such as fungal pneumonia) and septic emboli. Favor septic emboli. 2. Additional denser consolidation at the left lung base is most likely atelectasis, less likely pneumonia. 3. Small left pleural effusion. 4. No pulmonary embolism, with mild study limitations detailed above. 5. Heart size is upper normal.  No pericardial effusion. Electronically Signed   By: Franki Cabot M.D.   On: 12/06/2015 18:51    Caira Poche, DO  [facility] Pager [phone]  If 7PM-7AM, please contact night-coverage [email or web address] Password [email or web address] 12/08/2015, 4:03 PM   LOS: 3 days

## 2015-12-09 NOTE — Transfer of Care (Signed)
Immediate Anesthesia Transfer of Care Note  Patient: Erik [name]  Procedure(s) Performed: Procedure(s): TRANSESOPHAGEAL ECHOCARDIOGRAM (TEE) (N/A)  Patient Location: Endoscopy Unit  Anesthesia Type:MAC  Level of Consciousness: awake, alert  and oriented  Airway & Oxygen Therapy: Patient Spontanous Breathing and Patient connected to nasal cannula oxygen  Post-op Assessment: Report given to RN, Post -op Vital signs reviewed and stable and Patient moving all extremities  Post vital signs: Reviewed and stable  Last Vitals:  Vitals:   12/09/15 1130 12/09/15 1233  BP: 128/73 110/84  Pulse: 90 69  Resp: 20 (!) 21  Temp: 37.1 C     Last Pain:  Vitals:   12/09/15 1130  TempSrc: Oral  PainSc:       Patients Stated Pain Goal: 3 ([id number])  Complications: No apparent anesthesia complications

## 2015-12-09 NOTE — Progress Notes (Signed)
  Echocardiogram Echocardiogram Transesophageal has been performed.  Darlina Sicilian M 12/09/2015, 12:46 PM

## 2015-12-09 NOTE — Progress Notes (Signed)
PLEASE MAKE SURE PATIENT HAS REPEAT BLOOD CULTURES DONE  WE WILL NEED TO KEEP REPEATING THEM UNTIL WE CAN PROVE HE IS CLEARING HIS BACTEREMIA

## 2015-12-09 NOTE — Consult Note (Signed)
CARDIOLOGY CONSULT NOTE   Patient ID: Erik [name] MRN: [id number] DOB/AGE: 1995-08-12 20 y.o.  Admit date: 12/05/2015  Primary Physician   Gwendolyn Grant, MD Primary Cardiologist   Dr. Meda Coffee (previosly seen by Dr. Mare Ferrari) never followed up in clinic Reason for Consultation   Endocarditis Requesting Physician  Dr. Carles Collet  HPI: Erik [name] is a 20 y.o. 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  11/2014 patient admitted with overdose and found to be acute HF, stress cardiomyopathy with EF 35% on echo 11/08/14  and rhabdomyolysis. Repeat echo 11/14/14 showed normal LV function. No signs suspicious for endocarditis. Because of elevated troponin he also underwent cardiac cath which showed normal coronaries.   The patient never seen by cardiologist in a clinic. Last seen during 02/2015 admission during non-STEMI/abdominal abscess/cellulitis.  Patient was seen in ER 12/04/15 chest pain and fever. He was diagnosed with pneumonia and sent home on Levaquin. Blood culture drawn at that time was positive for staph aureus and he called for admission. Echocardiogram done on 12/07/2015 showed left ventricular function of 50-55%, no wall motion abnormality with vegetation on tricuspid valve with diameter of 2 cm, moderate TR with PASP estimated 25 mmHg. The patient was started on IV antibiotic. CT angiogram of chest showed septic emboli and pneumonia. TEE done by Dr. Acie Fredrickson today showed large vegetation involving 2 of the leaflet of tricuspid valve with moderate TR. Normal LV function. No thrombus or PFO. Cardiology is consulted for possible valve replacement per ID. Blood cultures positive for MSSA.   The patient states that he is a intermittent chest pain. He denies any shortness of breath, orthopnea, PND, syncope  or lower extremity edema. His use of IV heroine about week ago. He denies cocaine abuse. Seems he has a poor insight of his medical condition. EKG done on 12/04/15 showed sinus tachycardia at rate of 107 bpm.   Past Medical History:  Diagnosis Date  . Anxiety   . Drug abuse and dependence ([place])    admit with OD 11/2014: rhabdo and AKI, ARF -   . Hepatitis B    chronic viral/hx per notes 12/05/2015  . History of endocarditis    LESCHBER, [place] A [[id number]]  . IV drug user    heroine  . NSTEMI (non-ST elevated myocardial infarction) ([place])    Archie Endo 12/05/2015  . Pneumonia 12/04/2015  . Sinusitis      Past Surgical History:  Procedure Laterality Date  . CARDIAC CATHETERIZATION N/A 02/22/2015   Procedure: Left Heart Cath and Coronary Angiography;  Surgeon: Troy Sine, MD;  Location: [facility];  Service: Cardiovascular;  Laterality: N/A;  . TEE WITHOUT CARDIOVERSION N/A 02/24/2015   Procedure: TRANSESOPHAGEAL ECHOCARDIOGRAM (TEE);  Surgeon: Skeet Latch, MD;  Location: [facility] ENDOS[facility];  Service: Cardiovascular;  Laterality: N/A;    Allergies  Allergen Reactions  . Penicillins Anaphylaxis and Swelling    All "-cillins."  Angioedema also  . Sulfa Antibiotics Swelling and Rash    Childhood allergic reaction    I have reviewed the patient's current medications . vancomycin  1,500 mg Intravenous Q8H   . sodium chloride 400 mL (12/09/15 1440)   acetaminophen **OR** acetaminophen, bisacodyl, HYDROcodone-acetaminophen, ketorolac, ondansetron **OR** ondansetron (ZOFRAN) IV, traZODone  Prior to Admission medications   Medication Sig Start Date End Date  Taking? Authorizing Provider  levofloxacin (LEVAQUIN) 750 MG tablet Take 1 tablet (750 mg total) by mouth daily. 12/04/15  Yes Recardo Evangelist, PA-C     Social History   Social History  . Marital status: Single    Spouse name: N/A  . Number of children: 0  . Years of education: N/A   Occupational History  . Not on  file.   Social History Main Topics  . Smoking status: Current Every Day Smoker    Packs/day: 1.00    Types: Cigarettes  . Smokeless tobacco: Never Used     Comment: 12/05/2015 "haven't had a cigarette in 2 wks"  . Alcohol use Yes     Comment: 12/05/2015 "don't drink now"  . Drug use:     Types: Cocaine, Marijuana, IV     Comment: marijuana, heroin  . Sexual activity: Yes     Comment: heroine   Other Topics Concern  . Not on file   Social History Narrative   ** Merged History Encounter **       ** Merged History Encounter **      No family status information on file.   Family History  Problem Relation Age of Onset  . Adopted: Yes     ROS:  Full 14 point review of systems complete and found to be negative unless listed above.  Physical Exam: Blood pressure 118/78, pulse 90, temperature 98.5 F (36.9 C), temperature source Oral, resp. rate 16, height 5\' 6"  (1.676 m), weight 129 lb 6.6 oz (58.7 kg), SpO2 100 %.  General: Well developed, well nourished, male in no acute distress Head: Eyes PERRLA, No xanthomas. Normocephalic and atraumatic, oropharynx without edema or exudate.  Lungs: Resp regular and unlabored, CTA. Heart: RRR no s3, s4, or murmurs..   Neck: No carotid bruits. No lymphadenopathy.  No JVD. Abdomen: Bowel sounds present, abdomen soft and non-tender without masses or hernias noted. Msk:  No spine or cva tenderness. No weakness, no joint deformities or effusions. Extremities: No clubbing, cyanosis or edema. DP/PT/Radials 2+ and equal bilaterally. Neuro: Alert and oriented X 3. No focal deficits noted. Psych:  Good affect, responds appropriately Skin: No rashes or lesions noted.  Labs:   Lab Results  Component Value Date   WBC 12.7 (H) 12/08/2015   HGB 15.1 12/08/2015   HCT 45.3 12/08/2015   MCV 83.1 12/08/2015   PLT 207 12/08/2015   No results for input(s): INR in the last 72 hours.  Recent Labs Lab 12/05/15 1600  12/09/15 0444  NA 129*  < > 136    K 3.7  < > 4.2  CL 92*  < > 107  CO2 25  < > 22  BUN 11  < > 7  CREATININE 0.74  < > 0.63  CALCIUM 8.6*  < > 8.5*  PROT 6.8  --   --   BILITOT 0.5  --   --   ALKPHOS 79  --   --   ALT 23  --   --   AST 26  --   --   GLUCOSE 171*  < > 89  ALBUMIN 2.8*  --   --   < > = values in this interval not displayed. Magnesium  Date Value Ref Range Status  12/09/2015 2.0 1.7 - 2.4 mg/dL Final   No results for input(s): CKTOTAL, CKMB, TROPONINI in the last 72 hours. No results for input(s): TROPIPOC in the last 72 hours. No results found for: PROBNP  Lab Results  Component Value Date   CHOL 138 02/23/2015   HDL 32 (L) 02/23/2015   LDLCALC 71 02/23/2015   TRIG 173 (H) 02/23/2015   No results found for: DDIMER Lipase  Date/Time Value Ref Range Status  12/04/2015 01:09 PM 17 11 - 51 U/L Final   No results found for: TSH, T4TOTAL, T3FREE, THYROIDAB Vitamin B-12  Date/Time Value Ref Range Status  12/07/2015 07:57 AM 392 180 - 914 pg/mL Final    Comment:    (NOTE) This assay is not validated for testing neonatal or myeloproliferative syndrome specimens for Vitamin B12 levels.     TTE 12/07/15 LV EF: 50% -   55%  ------------------------------------------------------------------- Indications:      Dyspnea 786.09.  ------------------------------------------------------------------- History:   PMH:   Angina pectoris.  Risk factors:  IV drug use. Hepatitis B. Bacteremia.  ------------------------------------------------------------------- Study Conclusions  - Left ventricle: The cavity size was normal. Wall thickness was   normal. Systolic function was normal. The estimated ejection   fraction was in the range of 50% to 55%. Wall motion was normal;   there were no regional wall motion abnormalities. - Tricuspid valve: Irregular echodensity noted on atrial side of   tricuspid valve consistent with vegetation and clinical diagnosis   of endocarditis given history of  bacteremia and IV drug use.   Longest dimension measures nearly 2 cm. There was moderate   regurgitation. - Pulmonary arteries: PA peak pressure: 25 mm Hg (S). - Pericardium, extracardiac: There was no pericardial effusion.  Impressions:  - Normal LV wall thickness with LVEF 50-55%. Normal diastolic   function. Irregular echodensity noted on atrial side of tricuspid   valve consistent with vegetation and clinical diagnosis of   endocarditis given history of bacteremia and IV drug use. Longest   dimension measures nearly 2 cm. Moderate tricuspid regurgitation   noted with PASP estimated 25 mmHg.   TEE 12/09/15 Left Ventrical:  Normal LV   Mitral Valve: normal   Aortic Valve: normal  Tricuspid Valve: large vegatation involving 2 of the leaflets.   At leads moderate TR  Pulmonic Valve: normal   Left Atrium/ Left atrial appendage: no thrombus   Atrial septum: no PFO by color doppler   Aorta: normal    Complications: No apparent complications Patient did not tolerate procedure well.   Impression:  Tricuspid valve endocarditis.  Radiology:  No results found.  ASSESSMENT AND PLAN:     1. 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 TTE done on 12/07/2015 showed left ventricular function of 50-55%, no wall motion abnormality with dictation on tricuspid valve with diameter of 2 cm, moderate TR with PASP estimated 25 mmHg. CT angiogram of chest showed septic emboli and pneumonia. TEE done by Dr. Sharrell Ku today showed large vegetation involving 2 of the leaflet of tricuspid valve with moderate TR. Normal LV function. No thrombus or PFO.  Needs 6 weeks of IV antibiotics and CT surgery consultation.   2. Septic Embolic - Due to endocarditis  3. IVDU - Strongly encouraged discontinuation.    SignedLeanor Kail, [place] 12/09/2015, 4:30 PM Pager [phone]  Patient seen and examined and  history reviewed. Agree with above findings and plan. 20 yo Asian male with history of IVDA with heroin. Uses 2-3 x /week. Admitted with fever, chills and chest pain of one week duration. Blood cultures + for MSSA. Echo shows evidence of TV endocarditis with vegetations on 2 leaflets  and moderate TR. Confirmed by TEE without evidence of left sided endocarditis or ring abscess. CT confirms evidence of pulmonary septic emboli. Patient has responded initially with IV antibiotics with reduction in fever. No pain currently. On exam no peripheral stigmata of SBE. Lungs with few crackles. Soft 2/6 systolic murmur mid sternum. No edema.  Impression: SBE with MSSA involving the TV.  Plan: IV antibiotics for 6 weeks. Follow clinical course. Repeat Echo in one week CT surgery consult.  Counseled on need to abstain from IV drug use.  Peter Martinique, [place] 12/09/2015 5:10 PM

## 2015-12-09 NOTE — Anesthesia Preprocedure Evaluation (Addendum)
Anesthesia Evaluation  Patient identified by MRN, date of birth, ID band Patient awake    Reviewed: Allergy & Precautions, NPO status , Patient's Chart, lab work & pertinent test results  Airway Mallampati: I  TM Distance: >3 FB Neck ROM: Full    Dental  (+) Teeth Intact, Dental Advisory Given   Pulmonary Current Smoker,    breath sounds clear to auscultation       Cardiovascular  Rhythm:Regular Rate:Normal     Neuro/Psych    GI/Hepatic   Endo/Other    Renal/GU      Musculoskeletal   Abdominal   Peds  Hematology   Anesthesia Other Findings   Reproductive/Obstetrics                            Anesthesia Physical Anesthesia Plan  ASA: III  Anesthesia Plan: MAC   Post-op Pain Management:    Induction: Intravenous  Airway Management Planned: Natural Airway and Nasal Cannula  Additional Equipment:   Intra-op Plan:   Post-operative Plan:   Informed Consent: I have reviewed the patients History and Physical, chart, labs and discussed the procedure including the risks, benefits and alternatives for the proposed anesthesia with the patient or authorized representative who has indicated his/her understanding and acceptance.   Dental advisory given  Plan Discussed with: CRNA and Anesthesiologist  Anesthesia Plan Comments:         Anesthesia Quick Evaluation

## 2015-12-10 ENCOUNTER — Encounter (HOSPITAL_COMMUNITY): Payer: Self-pay | Admitting: Cardiovascular Disease

## 2015-12-10 ENCOUNTER — Inpatient Hospital Stay (HOSPITAL_COMMUNITY): Payer: 59

## 2015-12-10 DIAGNOSIS — I071 Rheumatic tricuspid insufficiency: Secondary | ICD-10-CM | POA: Diagnosis not present

## 2015-12-10 DIAGNOSIS — E871 Hypo-osmolality and hyponatremia: Secondary | ICD-10-CM | POA: Diagnosis not present

## 2015-12-10 DIAGNOSIS — D6959 Other secondary thrombocytopenia: Secondary | ICD-10-CM | POA: Diagnosis not present

## 2015-12-10 DIAGNOSIS — I2690 Septic pulmonary embolism without acute cor pulmonale: Secondary | ICD-10-CM | POA: Diagnosis not present

## 2015-12-10 DIAGNOSIS — F1320 Sedative, hypnotic or anxiolytic dependence, uncomplicated: Secondary | ICD-10-CM | POA: Diagnosis not present

## 2015-12-10 DIAGNOSIS — I361 Nonrheumatic tricuspid (valve) insufficiency: Secondary | ICD-10-CM

## 2015-12-10 DIAGNOSIS — Z880 Allergy status to penicillin: Secondary | ICD-10-CM

## 2015-12-10 DIAGNOSIS — F1910 Other psychoactive substance abuse, uncomplicated: Secondary | ICD-10-CM | POA: Diagnosis not present

## 2015-12-10 DIAGNOSIS — I330 Acute and subacute infective endocarditis: Secondary | ICD-10-CM | POA: Diagnosis not present

## 2015-12-10 DIAGNOSIS — B181 Chronic viral hepatitis B without delta-agent: Secondary | ICD-10-CM | POA: Diagnosis not present

## 2015-12-10 DIAGNOSIS — B9561 Methicillin susceptible Staphylococcus aureus infection as the cause of diseases classified elsewhere: Secondary | ICD-10-CM | POA: Diagnosis not present

## 2015-12-10 DIAGNOSIS — R079 Chest pain, unspecified: Secondary | ICD-10-CM

## 2015-12-10 LAB — CBC
HEMATOCRIT: 36.3 % — AB (ref 39.0–52.0)
Hemoglobin: 11.9 g/dL — ABNORMAL LOW (ref 13.0–17.0)
MCH: 27.5 pg (ref 26.0–34.0)
MCHC: 32.8 g/dL (ref 30.0–36.0)
MCV: 84 fL (ref 78.0–100.0)
PLATELETS: 239 10*3/uL (ref 150–400)
RBC: 4.32 MIL/uL (ref 4.22–5.81)
RDW: 12.8 % (ref 11.5–15.5)
WBC: 13.2 10*3/uL — AB (ref 4.0–10.5)

## 2015-12-10 LAB — CULTURE, BLOOD (ROUTINE X 2)

## 2015-12-10 LAB — VANCOMYCIN, TROUGH: VANCOMYCIN TR: 19 ug/mL (ref 15–20)

## 2015-12-10 MED ORDER — CEFAZOLIN SODIUM-DEXTROSE 2-4 GM/100ML-% IV SOLN
2.0000 g | Freq: Three times a day (TID) | INTRAVENOUS | Status: DC
  Administered 2015-12-10 – 2015-12-16 (×19): 2 g via INTRAVENOUS
  Filled 2015-12-10 (×24): qty 100

## 2015-12-10 NOTE — Progress Notes (Signed)
Received a call from the Micro Lab that he has a Blood culture obtained on 10/3 that is growing Gram positive cocci in clusters.  BCID will not be performed since he has known MSSA infection.  He is currently being treated with Vancomycin due to a penicillin allergy.  This information has been shared with the ID team.  Legrand Como, Pharm.D., BCPS, AAHIVP Clinical Pharmacist Phone: [phone] or [phone] Pager: [phone] 12/10/2015, 10:33 AM

## 2015-12-10 NOTE — Progress Notes (Signed)
Pharmacy Antibiotic Note Erik [name] is a 20 y.o. male admitted on 12/05/2015 with MSSA bacteremia and endocarditis. Currently on Vancomycin in the setting of PCN allergy.   Vancomycin trough obtain this morning was 19 which is within desired range of 15 - 20 based on indication. SCr has remained stable.    Plan: 1. Continue vancomycin 1500 mg every 8 hours for now 2. Obtain weekly trough unless clinical course dictates otherwise 3. SCr every 72 hours while on vancomycin and in the inpatient setting   Height: 5\' 6"  (167.6 cm) Weight: 129 lb 6.6 oz (58.7 kg) IBW/kg (Calculated) : 63.8  Temp (24hrs), Avg:98.5 F (36.9 C), Min:98.1 F (36.7 C), Max:98.8 F (37.1 C)   Recent Labs Lab 12/04/15 1309 12/05/15 1600 12/05/15 1844 12/05/15 2336 12/06/15 0226  12/07/15 0757 12/08/15 1230 12/08/15 1234 12/09/15 0444 12/10/15 0457  WBC 10.1 13.6*  --   --  9.2  --  12.9*  --  12.7*  --  13.2*  CREATININE 0.75 0.74  --   --  0.71  --  0.60*  --   --  0.63  --   LATICACIDVEN  --   --  3.0* 1.3 1.9  --   --   --   --   --   --   VANCOTROUGH  --   --   --   --   --   < > 6* 14*  --   --  19  < > = values in this interval not displayed.  Estimated Creatinine Clearance: 120.3 mL/min (by C-G formula based on SCr of 0.63 mg/dL).    Allergies  Allergen Reactions  . Penicillins Anaphylaxis and Swelling    All "-cillins."  Angioedema also  . Sulfa Antibiotics Swelling and Rash    Childhood allergic reaction    Antimicrobials this admission: Vanc 9/29 >>  Dose adjustments this admission: 10/1 VT = 6 on 750mg  IV q8h: Dose increased to 1000mg  Q6H 10/2 VT = 14 on 1gm q6h: Dose changed to avoid q6h interval  Microbiology results: 9/28 BCx x4 - GPC (BCID MSSA) 10/1 BCx - GPC x 2 (BCID MSSA) 10/3 BCx: pending    Thank you for allowing pharmacy to be a part of this patient's care.  Vincenza Hews, PharmD, BCPS 12/10/2015, 8:59 AM Pager: [phone]

## 2015-12-10 NOTE — Progress Notes (Signed)
Pharmacy Antibiotic Note Erik [name] is a 20 y.o. male admitted on 12/05/2015 with MSSA bacteremia and endocarditis. Pt has been on vancomycin for 5 days, but repeat blood cultures still growing MSSA. Clarified PCN allergy with patient's mom- lip swelling when he was 7yo. Patient is willing to try cephalosporin w/ close monitoring.   Plan: -Discontinue vancomycin -Start cefazolin 2g IV Q8H -Monitor closely for allergic reaction   Height: 5\' 6"  (167.6 cm) Weight: 129 lb 6.6 oz (58.7 kg) IBW/kg (Calculated) : 63.8  Temp (24hrs), Avg:98.9 F (37.2 C), Min:98.2 F (36.8 C), Max:100.1 F (37.8 C)   Recent Labs Lab 12/04/15 1309 12/05/15 1600 12/05/15 1844 12/05/15 2336 12/06/15 0226  12/07/15 0757 12/08/15 1230 12/08/15 1234 12/09/15 0444 12/10/15 0457  WBC 10.1 13.6*  --   --  9.2  --  12.9*  --  12.7*  --  13.2*  CREATININE 0.75 0.74  --   --  0.71  --  0.60*  --   --  0.63  --   LATICACIDVEN  --   --  3.0* 1.3 1.9  --   --   --   --   --   --   VANCOTROUGH  --   --   --   --   --   < > 6* 14*  --   --  19  < > = values in this interval not displayed.  Estimated Creatinine Clearance: 120.3 mL/min (by C-G formula based on SCr of 0.63 mg/dL).    Allergies  Allergen Reactions  . Penicillins Swelling    Per mom- when he was 6 or 7yo his bottom lip swelled  . Sulfa Antibiotics Swelling and Rash    Childhood allergic reaction    Antimicrobials this admission: Vanc 9/29 >>10/4 Cefazolin 10/4>>  Dose adjustments this admission: 10/1 VT = 6 on 750mg  IV q8h: Dose increased to 1000mg  Q6H 10/2 VT = 14 on 1gm q6h: Dose changed to avoid q6h interval  Microbiology results: 9/28 BCx x4: MSSA 10/1 BCx: MSSA 10/3 BCx: BCID- GPC in clusters   Thank you for allowing pharmacy to be a part of this patient's care.  Erik [name], PharmD PGY1 Pharmacy Resident Pager: [phone] 12/10/2015 4:50 PM

## 2015-12-10 NOTE — Progress Notes (Signed)
Subjective: No new complaints   Antibiotics:  Anti-infectives    Start     Dose/Rate Route Frequency Ordered Stop   12/10/15 1700  ceFAZolin (ANCEF) IVPB 2g/100 mL premix     2 g 200 mL/hr over 30 Minutes Intravenous Every 8 hours 12/10/15 1642     12/08/15 2200  vancomycin (VANCOCIN) 1,500 mg in sodium chloride 0.9 % 500 mL IVPB  Status:  Discontinued     1,500 mg 250 mL/hr over 120 Minutes Intravenous Every 8 hours 12/08/15 1447 12/10/15 1640   12/07/15 1000  vancomycin (VANCOCIN) 1,750 mg in sodium chloride 0.9 % 500 mL IVPB  Status:  Discontinued     1,750 mg 250 mL/hr over 120 Minutes Intravenous Every 8 hours 12/07/15 0947 12/07/15 0957   12/07/15 1000  vancomycin (VANCOCIN) IVPB 1000 mg/200 mL premix  Status:  Discontinued     1,000 mg 200 mL/hr over 60 Minutes Intravenous Every 6 hours 12/07/15 0958 12/08/15 1447   12/06/15 0100  vancomycin (VANCOCIN) IVPB 750 mg/150 ml premix  Status:  Discontinued     750 mg 150 mL/hr over 60 Minutes Intravenous Every 8 hours 12/05/15 1605 12/07/15 0947   12/05/15 1615  vancomycin (VANCOCIN) IVPB 1000 mg/200 mL premix     1,000 mg 200 mL/hr over 60 Minutes Intravenous  Once 12/05/15 1605 12/05/15 2008      Medications: Scheduled Meds: .  ceFAZolin (ANCEF) IV  2 g Intravenous Q8H   Continuous Infusions: . sodium chloride 400 mL (12/09/15 1440)   PRN Meds:.acetaminophen **OR** acetaminophen, bisacodyl, HYDROcodone-acetaminophen, ondansetron **OR** ondansetron (ZOFRAN) IV, traZODone    Objective: Weight change:   Intake/Output Summary (Last 24 hours) at 12/10/15 1916 Last data filed at 12/10/15 0438  Gross per 24 hour  Intake           899.33 ml  Output                0 ml  Net           899.33 ml   Blood pressure 112/60, pulse 94, temperature 100.1 F (37.8 C), temperature source Oral, resp. rate 15, height 5\' 6"  (1.676 m), weight 129 lb 6.6 oz (58.7 kg), SpO2 96 %. Temp:  [98.2 F (36.8 C)-100.1 F (37.8 C)]  100.1 F (37.8 C) (10/04 1504) Pulse Rate:  [79-94] 94 (10/04 1504) Resp:  [15-18] 15 (10/04 1504) BP: (107-113)/(60-66) 112/60 (10/04 1504) SpO2:  [96 %-98 %] 96 % (10/04 1504)  Physical Exam: General: Alert and awake, oriented x3, not in any acute distress. HEENT: anicteric sclera,  Conjunctiva are clear ,EOMI, oropharynx clear and without exudate, Cardiovascular: tachycardic rate, normal r,  no murmur rubs or gallops Pulmonary: clear to auscultation bilaterally, no wheezing, rales or rhonchi, really having full breaths due to pleuritic chest pain Gastrointestinal: soft nontender, nondistended, normal bowel sounds, Musculoskeletal: no  clubbing or edema noted bilaterally,  Skin, soft tissue: no rashes, tattoos present, or Janeway lesions splitter hemorrhages or Osler's nodes Neuro: nonfocal, strength and sensation intact  CBC:  CBC Latest Ref Rng & Units 12/10/2015 12/08/2015 12/07/2015  WBC 4.0 - 10.5 K/uL 13.2(H) 12.7(H) 12.9(H)  Hemoglobin 13.0 - 17.0 g/dL 11.9(L) 15.1 12.6(L)  Hematocrit 39.0 - 52.0 % 36.3(L) 45.3 37.6(L)  Platelets 150 - 400 K/uL 239 207 158     BMET  Recent Labs  12/09/15 0444  NA 136  K 4.2  CL 107  CO2 22  GLUCOSE 89  BUN 7  CREATININE 0.63  CALCIUM 8.5*     Liver Panel  No results for input(s): PROT, ALBUMIN, AST, ALT, ALKPHOS, BILITOT, BILIDIR, IBILI in the last 72 hours.     Sedimentation Rate No results for input(s): ESRSEDRATE in the last 72 hours. C-Reactive Protein No results for input(s): CRP in the last 72 hours.  Micro Results: Recent Results (from the past 720 hour(s))  Culture, blood (routine x 2)     Status: Abnormal   Collection Time: 12/04/15  7:20 PM  Result Value Ref Range Status   Specimen Description BLOOD LEFT ANTECUBITAL  Final   Special Requests BOTTLES DRAWN AEROBIC AND ANAEROBIC 5 CC  Final   Culture  Setup Time   Final    GRAM POSITIVE COCCI IN CLUSTERS IN BOTH AEROBIC AND ANAEROBIC BOTTLES CRITICAL  RESULT CALLED TO, READ BACK BY AND VERIFIED WITH: P CLARK,RN AT [id number] 12/05/15 BY L BENFIELD Performed at [facility] (A)  Final   Report Status 12/07/2015 FINAL  Final   Organism ID, Bacteria STAPHYLOCOCCUS AUREUS  Final      Susceptibility   Staphylococcus aureus - MIC*    CIPROFLOXACIN <=0.5 SENSITIVE Sensitive     ERYTHROMYCIN >=8 RESISTANT Resistant     GENTAMICIN <=0.5 SENSITIVE Sensitive     OXACILLIN <=0.25 SENSITIVE Sensitive     TETRACYCLINE <=1 SENSITIVE Sensitive     VANCOMYCIN 1 SENSITIVE Sensitive     TRIMETH/SULFA <=10 SENSITIVE Sensitive     CLINDAMYCIN <=0.25 SENSITIVE Sensitive     RIFAMPIN <=0.5 SENSITIVE Sensitive     Inducible Clindamycin NEGATIVE Sensitive     * STAPHYLOCOCCUS AUREUS  Blood Culture ID Panel (Reflexed)     Status: Abnormal   Collection Time: 12/04/15  7:20 PM  Result Value Ref Range Status   Enterococcus species NOT DETECTED NOT DETECTED Final   Listeria monocytogenes NOT DETECTED NOT DETECTED Final   Staphylococcus species DETECTED (A) NOT DETECTED Final    Comment: CRITICAL RESULT CALLED TO, READ BACK BY AND VERIFIED WITH: P CLARK,RN AT [id number] 12/05/15 BY L BENFIELD    Staphylococcus aureus DETECTED (A) NOT DETECTED Final    Comment: CRITICAL RESULT CALLED TO, READ BACK BY AND VERIFIED WITH: P CLARK,RN AT [id number] 12/05/15 BY L BENFIELD    Methicillin resistance NOT DETECTED NOT DETECTED Final   Streptococcus species NOT DETECTED NOT DETECTED Final   Streptococcus agalactiae NOT DETECTED NOT DETECTED Final   Streptococcus pneumoniae NOT DETECTED NOT DETECTED Final   Streptococcus pyogenes NOT DETECTED NOT DETECTED Final   Acinetobacter baumannii NOT DETECTED NOT DETECTED Final   Enterobacteriaceae species NOT DETECTED NOT DETECTED Final   Enterobacter cloacae complex NOT DETECTED NOT DETECTED Final   Escherichia coli NOT DETECTED NOT DETECTED Final   Klebsiella oxytoca NOT DETECTED NOT DETECTED Final    Klebsiella pneumoniae NOT DETECTED NOT DETECTED Final   Proteus species NOT DETECTED NOT DETECTED Final   Serratia marcescens NOT DETECTED NOT DETECTED Final   Haemophilus influenzae NOT DETECTED NOT DETECTED Final   Neisseria meningitidis NOT DETECTED NOT DETECTED Final   Pseudomonas aeruginosa NOT DETECTED NOT DETECTED Final   Candida albicans NOT DETECTED NOT DETECTED Final   Candida glabrata NOT DETECTED NOT DETECTED Final   Candida krusei NOT DETECTED NOT DETECTED Final   Candida parapsilosis NOT DETECTED NOT DETECTED Final   Candida tropicalis NOT DETECTED NOT DETECTED Final    Comment: Performed at [facility]  Culture, blood (  routine x 2)     Status: Abnormal   Collection Time: 12/04/15  8:40 PM  Result Value Ref Range Status   Specimen Description BLOOD RIGHT HAND  Final   Special Requests BOTTLES DRAWN AEROBIC AND ANAEROBIC 5CC  Final   Culture  Setup Time   Final    GRAM POSITIVE COCCI IN CLUSTERS IN BOTH AEROBIC AND ANAEROBIC BOTTLES CRITICAL RESULT CALLED TO, READ BACK BY AND VERIFIED WITH: P CLARK,RN AT [id number] 12/05/15 BY L BENFIELD    Culture (A)  Final    STAPHYLOCOCCUS AUREUS SUSCEPTIBILITIES PERFORMED ON PREVIOUS CULTURE WITHIN THE LAST 5 DAYS. Performed at [facility]    Report Status 12/07/2015 FINAL  Final  MRSA PCR Screening     Status: None   Collection Time: 12/05/15  3:38 PM  Result Value Ref Range Status   MRSA by PCR NEGATIVE NEGATIVE Final    Comment:        The GeneXpert MRSA Assay (FDA approved for NASAL specimens only), is one component of a comprehensive MRSA colonization surveillance program. It is not intended to diagnose MRSA infection nor to guide or monitor treatment for MRSA infections.   Culture, blood (Routine X 2) w Reflex to ID Panel     Status: Abnormal   Collection Time: 12/07/15  5:30 PM  Result Value Ref Range Status   Specimen Description BLOOD RIGHT HAND  Final   Special Requests IN PEDIATRIC BOTTLE 1CC  Final    Culture  Setup Time   Final    GRAM POSITIVE COCCI IN CLUSTERS IN PEDIATRIC BOTTLE CRITICAL VALUE NOTED.  VALUE IS CONSISTENT WITH PREVIOUSLY REPORTED AND CALLED VALUE.    Culture (A)  Final    STAPHYLOCOCCUS AUREUS SUSCEPTIBILITIES PERFORMED ON PREVIOUS CULTURE WITHIN THE LAST 5 DAYS.    Report Status 12/10/2015 FINAL  Final  Culture, blood (Routine X 2) w Reflex to ID Panel     Status: Abnormal   Collection Time: 12/07/15  5:30 PM  Result Value Ref Range Status   Specimen Description BLOOD LEFT HAND  Final   Special Requests IN PEDIATRIC BOTTLE 2CC  Final   Culture  Setup Time   Final    GRAM POSITIVE COCCI IN CLUSTERS IN PEDIATRIC BOTTLE CRITICAL RESULT CALLED TO, READ BACK BY AND VERIFIED WITH: Leonie Green Pharm.D. 11:20 12/08/15 ([email or web address])    Culture STAPHYLOCOCCUS AUREUS (A)  Final   Report Status 12/10/2015 FINAL  Final   Organism ID, Bacteria STAPHYLOCOCCUS AUREUS  Final      Susceptibility   Staphylococcus aureus - MIC*    CIPROFLOXACIN <=0.5 SENSITIVE Sensitive     ERYTHROMYCIN >=8 RESISTANT Resistant     GENTAMICIN <=0.5 SENSITIVE Sensitive     OXACILLIN 0.5 SENSITIVE Sensitive     TETRACYCLINE <=1 SENSITIVE Sensitive     VANCOMYCIN <=0.5 SENSITIVE Sensitive     TRIMETH/SULFA <=10 SENSITIVE Sensitive     CLINDAMYCIN <=0.25 SENSITIVE Sensitive     RIFAMPIN <=0.5 SENSITIVE Sensitive     Inducible Clindamycin NEGATIVE Sensitive     * STAPHYLOCOCCUS AUREUS  Blood Culture ID Panel (Reflexed)     Status: Abnormal   Collection Time: 12/07/15  5:30 PM  Result Value Ref Range Status   Enterococcus species NOT DETECTED NOT DETECTED Final   Listeria monocytogenes NOT DETECTED NOT DETECTED Final   Staphylococcus species DETECTED (A) NOT DETECTED Final    Comment: CRITICAL RESULT CALLED TO, READ BACK BY AND VERIFIED WITH: MRadford Pax Pharm.D. 11:20 12/08/15 ([email or web address])  Staphylococcus aureus DETECTED (A) NOT DETECTED Final    Comment: CRITICAL RESULT CALLED TO, READ BACK BY  AND VERIFIED WITH: M. Radford Pax Pharm.D. 11:20 12/08/15 ([email or web address])    Methicillin resistance NOT DETECTED NOT DETECTED Final   Streptococcus species NOT DETECTED NOT DETECTED Final   Streptococcus agalactiae NOT DETECTED NOT DETECTED Final   Streptococcus pneumoniae NOT DETECTED NOT DETECTED Final   Streptococcus pyogenes NOT DETECTED NOT DETECTED Final   Acinetobacter baumannii NOT DETECTED NOT DETECTED Final   Enterobacteriaceae species NOT DETECTED NOT DETECTED Final   Enterobacter cloacae complex NOT DETECTED NOT DETECTED Final   Escherichia coli NOT DETECTED NOT DETECTED Final   Klebsiella oxytoca NOT DETECTED NOT DETECTED Final   Klebsiella pneumoniae NOT DETECTED NOT DETECTED Final   Proteus species NOT DETECTED NOT DETECTED Final   Serratia marcescens NOT DETECTED NOT DETECTED Final   Haemophilus influenzae NOT DETECTED NOT DETECTED Final   Neisseria meningitidis NOT DETECTED NOT DETECTED Final   Pseudomonas aeruginosa NOT DETECTED NOT DETECTED Final   Candida albicans NOT DETECTED NOT DETECTED Final   Candida glabrata NOT DETECTED NOT DETECTED Final   Candida krusei NOT DETECTED NOT DETECTED Final   Candida parapsilosis NOT DETECTED NOT DETECTED Final   Candida tropicalis NOT DETECTED NOT DETECTED Final  Culture, blood (Routine X 2) w Reflex to ID Panel     Status: None (Preliminary result)   Collection Time: 12/09/15  9:40 AM  Result Value Ref Range Status   Specimen Description BLOOD LEFT HAND  Final   Special Requests IN PEDIATRIC BOTTLE 4CC  Final   Culture  Setup Time   Final    GRAM POSITIVE COCCI IN CLUSTERS AEROBIC BOTTLE ONLY CRITICAL RESULT CALLED TO, READ BACK BY AND VERIFIED WITH: M TURNER 12/10/15 @ 1027 M VESTAL    Culture GRAM POSITIVE COCCI  Final   Report Status PENDING  Incomplete  Culture, blood (Routine X 2) w Reflex to ID Panel     Status: None (Preliminary result)   Collection Time: 12/09/15  9:55 AM  Result Value Ref Range Status   Specimen Description  BLOOD RIGHT HAND  Final   Special Requests IN PEDIATRIC BOTTLE 1CC  Final   Culture NO GROWTH 1 DAY  Final   Report Status PENDING  Incomplete    Studies/Results: Dg Orthopantogram  Result Date: 12/10/2015 CLINICAL DATA:  Bacteremia.  Jaw pain. EXAM: ORTHOPANTOGRAM/PANORAMIC COMPARISON:  None. FINDINGS: There is no evidence of fracture or other bony abnormality. IMPRESSION: No definite abnormality involving the mandible. Electronically Signed   By: Marijo Conception, M.D.   On: 12/10/2015 07:37      Assessment/Plan:  INTERVAL HISTORY: TEE with LARGE vegetation on TWO leaflets of TV  SEEN BY CVTS AND CARDIOLOGY FORMALLY  PTS MOM STATES HIS TOP LIP SWELLED ON PCN AS A CHILD, PATIENT WILLING TO TRIAL OF CEPHALOSPORIN   Principal Problem:   Bacteremia Active Problems:   Benzodiazepine dependence ([place])   IVDU (intravenous drug user)   Viral hepatitis B chronic (HCC)   Leukocytosis   Hyponatremia   Thrombocytopenia (HCC)   Chest pain   Staphylococcus aureus bacteremia   Septic pulmonary embolism (HCC)   Hypokalemia   Acute bacterial endocarditis   Tricuspid regurgitation    RITHVIK WALWORTH is a 20 y.o. male with 20 y.o. 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 MSSA right-sided endocarditis:  [facility] Staphylococcus aureus bacteremia  Staphylococcus aureus bacteremia (SAB) is associated with a high rate of complications and mortality.  Specific aspects of clinical management are critical to optimizing the outcome of patients with SAB.  Therefore, the [facility] Antimicrobial Management Team [facility]) has initiated an intervention aimed at improving the management of SAB at [facility].  To do so, Infectious Diseases physicians are providing an evidence-based consult for the management of all patients  with SAB.     Yes No Comments  Perform follow-up blood cultures (even if the patient is afebrile) to ensure clearance of bacteremia [x]   BLOOD CULTURES are PERSISTENTLY POSITIVE RE-ORDERING TODAY  Remove vascular catheter and obtain follow-up blood cultures after the removal of the catheter []  []  DO NOT PLACE PICC LINE OR CENTRAL LINE UNTIL CULTURES FROM TODAY ARE NO GROWTH X 4-5 DAYS  Perform echocardiography to evaluate for endocarditis (transthoracic ECHO is 40-50% sensitive, TEE is > 90% sensitive) []  []  TEE with large vegetation on 2 leaflets of TV       Ensure source control []  []  Have all abscesses been drained effectively? Have deep seeded infections (septic joints or osteomyelitis) had appropriate surgical debridement? Source seems to be his injection drug use   Investigate for "metastatic" sites of infection []  []  Does the patient have ANY symptom or physical exam finding that would suggest a deeper infection (back or neck pain that may be suggestive of vertebral osteomyelitis or epidural abscess, muscle pain that could be a symptom of pyomyositis)?  Keep in mind that for deep seeded infections MRI imaging with contrast is preferred rather than other often insensitive tests such as plain x-rays, especially early in a patient's presentation.   No clinical signs pointing to and others metastatic site of infection besides the lungs at this point   Trial of ANCEF 2G IV Q 8 HOURS [x]  []  Beta-lactam antibiotics are preferred for MSSA due to higher cure rates.   If on Vancomycin, goal trough should be 15 - 20 mcg/mL  Estimated duration of IV antibiotic therapy:  6 weeks but would need to be in a skilled nursing facility  [x]  []  Consult case management for probably prolonged outpatient IV antibiotic therapy    PLEASE CONSULT CARDIOLOGY AND CARDIOTHORACIC SURGERY FORMALLY   I DOUBT HE WILL END UP HAVING VALVE REPLACEMENT BUT VEGETATION IS LARGE and we also agreed as part of Endocarditis  task force to have these pts seen by multiple disciplines  #2 Chronic hepatitis be without hepatic coma: I would are you that there is a good reason to consider treating him with anti-hepatitis B therapy in particular if there is any suspicion of shared needles as he could spread hep B in the community if he shares his needles with fellow IV drug users. He denied using shared needles but it is something to consider. I don't think he needs screening for hepatocellular carcinoma at this young age  #3 Intravenous drug use: Clearly he needs to being gauged in a opiate replacement plan to treat his injection drug use as are hisa for a to administer intravenous drugs and to avoid further episodes of infectious endocarditis from injection drug use. He says he did not like methadone at all suboxone was "ok"   #4 cracked tooth and problems with dentition: Consider Panorex and he will need to be plugged into the dental as an outpatient. Proper dental hygiene is clearly also critical given his now documented history of endocarditis he  will also need antecedent antibiotics prior to invasive dental procedures.   #5 PCN ALLERGY: Grateful to pharmacy for thoroughly investigating PCN allergy will trial cephalosporin  I spent greater than 35  minutes with the patient including greater than 50% of time in face to face counsel of the patient re his MSSA Right sided endocdarditis with septic emboli, IVDU, chronic hepatitis B  and in coordination of his care.     LOS: 5 days   Alcide Evener 12/10/2015, 7:16 PM

## 2015-12-10 NOTE — Care Management Note (Signed)
Case Management Note  Patient Details  Name: REES FATEMI MRN: [id number] Date of Birth: 03-05-1996  Subjective/Objective:           Admitted with bacteremia, hx of IVDU, hepatitis B, MSSA tricuspid valve endocarditis/septic emboli. From home with family. PTA independent with ADL, no DME usage.        PCP: Gwendolyn Grant  Action/Plan: Pt will need 6 weeks of IV antibiotics.  ID following.....Marland KitchenCSW aware and following....patient will need  SNF placement 2/2 IVDU. CSW.  Expected Discharge Date:                  Expected Discharge Plan:  Skilled Nursing Facility  In-House Referral:  Clinical Social Work  Discharge planning Services  CM Consult  Post Acute Care Choice:    Choice offered to:     DME Arranged:    DME Agency:     HH Arranged:    [place] Agency:     Status of Service:  In process, will continue to follow  If discussed at Long Length of Stay Meetings, dates discussed:    Additional Comments:  Sharin Mons, RN 12/10/2015, 1:50 PM

## 2015-12-10 NOTE — Progress Notes (Signed)
TELEMETRY: Reviewed telemetry pt in NSR: Vitals:   12/09/15 1445 12/09/15 2157 12/10/15 0449 12/10/15 0612  BP: 118/78 113/65 108/61 107/66  Pulse: 90 90 79 80  Resp: 16 18 18 18   Temp: 98.5 F (36.9 C) 98.8 F (37.1 C) 98.6 F (37 C) 98.2 F (36.8 C)  TempSrc: Oral   Oral  SpO2: 100% 97% 98% 97%  Weight:      Height:        Intake/Output Summary (Last 24 hours) at 12/10/15 1104 Last data filed at 12/10/15 0438  Gross per 24 hour  Intake          1099.33 ml  Output                0 ml  Net          1099.33 ml   Filed Weights   12/05/15 1540  Weight: 129 lb 6.6 oz (58.7 kg)    Subjective  Still has some lateral chest pain that is "bearable", notes some intermittent pain in left ankle  . vancomycin  1,500 mg Intravenous Q8H   . sodium chloride 400 mL (12/09/15 1440)    LABS: Basic Metabolic Panel:  Recent Labs  12/08/15 1836 12/09/15 0444  NA  --  136  K  --  4.2  CL  --  107  CO2  --  22  GLUCOSE  --  89  BUN  --  7  CREATININE  --  0.63  CALCIUM  --  8.5*  MG 2.1 2.0   Liver Function Tests: No results for input(s): AST, ALT, ALKPHOS, BILITOT, PROT, ALBUMIN in the last 72 hours. No results for input(s): LIPASE, AMYLASE in the last 72 hours. CBC:  Recent Labs  12/08/15 1234 12/10/15 0457  WBC 12.7* 13.2*  HGB 15.1 11.9*  HCT 45.3 36.3*  MCV 83.1 84.0  PLT 207 239   Cardiac Enzymes: No results for input(s): CKTOTAL, CKMB, CKMBINDEX, TROPONINI in the last 72 hours. BNP: No results for input(s): PROBNP in the last 72 hours. D-Dimer: No results for input(s): DDIMER in the last 72 hours. Hemoglobin A1C: No results for input(s): HGBA1C in the last 72 hours. Fasting Lipid Panel: No results for input(s): CHOL, HDL, LDLCALC, TRIG, CHOLHDL, LDLDIRECT in the last 72 hours. Thyroid Function Tests: No results for input(s): TSH, T4TOTAL, T3FREE, THYROIDAB in the last 72 hours.  Invalid input(s): FREET3   Radiology/Studies:  Dg  Orthopantogram  Result Date: 12/10/2015 CLINICAL DATA:  Bacteremia.  Jaw pain. EXAM: ORTHOPANTOGRAM/PANORAMIC COMPARISON:  None. FINDINGS: There is no evidence of fracture or other bony abnormality. IMPRESSION: No definite abnormality involving the mandible. Electronically Signed   By: Marijo Conception, M.D.   On: 12/10/2015 07:37    PHYSICAL EXAM General: Well developed, well nourished, in no acute distress. Head: Normal Neck: Negative for carotid bruits. JVD not elevated. No adenopathy Lungs: Clear bilaterally to auscultation without wheezes, rales, or rhonchi. Breathing is unlabored. Heart: RRR S1 S2 with gr 2/6 systolic murmur LSB Abdomen: Soft, non-tender, non-distended with normoactive bowel sounds. No hepatomegaly. No rebound/guarding. No obvious abdominal masses. Msk:  Strength and tone appears normal for age. Extremities: No clubbing, cyanosis or edema.  Distal pedal pulses are 2+ and equal bilaterally. Left ankle without swelling, nodules, or rash. Neuro: Alert and oriented X 3. Moves all extremities spontaneously. Psych:  Responds to questions appropriately with a normal affect.  ASSESSMENT AND PLAN: 1. Tricuspid valve endocarditis in setting of methicillin sensitive  Staphylococcus aureus bacteremia - He continues to use IV drug abuse. TTE done on 12/07/2015 showed left ventricular function of 50-55%, no wall motion abnormality with vegetation on tricuspid valve with diameter of 2 cm, moderate TR with PASP estimated 25 mmHg. CT angiogram of chest showed septic emboli and pneumonia. TEE  showed large vegetation involving 2 of the leaflet of tricuspid valve with moderate TR. Normal LV function. No thrombus or PFO.  Needs 6 weeks of IV antibiotics. CT surgery consulted. No indication for surgery at this point. BC dated 10/3 still positive. Continue IV antibiotics per ID. Would repeat Echo in one week   2. Septic Embolic - Due to endocarditis  3. IVDU - Strongly encouraged  discontinuation.   Present on Admission: . Benzodiazepine dependence ([place]) . Bacteremia . Hyponatremia . Thrombocytopenia ([place]) . IVDU (intravenous drug user) . Leukocytosis . Viral hepatitis B chronic ([place]) . Chest pain . Staphylococcus aureus bacteremia . Septic pulmonary embolism ([place]) . Acute bacterial endocarditis . Tricuspid regurgitation   Signed, Peter [place], [place] 12/10/2015 11:04 AM

## 2015-12-10 NOTE — Progress Notes (Signed)
PROGRESS NOTE    Erik [name]  [id number] DOB: 1995-11-21 DOA: 12/05/2015 PCP: Gwendolyn Grant, MD   Brief Narrative: Erik [name] is a 20 y.o. male with a history of intravenous drug use, anxiety, endocarditis, chronic hepatitis B presented after being informed of positive blood cultures that was obtained on 12/04/2015. Preliminary results show Staphylococcus aureus in 2 out of 2 sets. The patient initially presented to the emergency department on 12/04/2015 with chest pain and shortness of breath. The patient was given a dose of levofloxacin and discharged from the emergency department after blood cultures were obtained. The patient states that he last used heroin approximately 2 weeks prior to this admission. during his admission from 02/22/2015 through 02/26/2015,, the patient was admitted for treatment of rhabdomyolysis, cellulitisof bilateral feet, and elevated troponins. The patient also has had a previous admission back in September 2016 for narcotic induced respiratory failure requiring intubation. In addition, the patient has had a number of previous admissions in the past for narcotic and other illicit drug overdose.The patient was started on intravenous vancomycin and admitted for further evaluation of his bacteremia.   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 Blood cultures still growing bacteria. Large vegetation seen on 2 tricuspid valve leaflets on TEE. Apparently patient has had angioedema secondary to penicillin exposure. Cardiothoracic surgery  evaluated sided no surgery at this time. -continue vancomycin -repeat blood cultures. Goal is no growth x5 days  Pulmonary opacities Septic emboli Secondary to MSSA bacteremia. Patient is symptomatic with pleuritic chest pain.  Hyponatremia Resolved  Thrombocytopenia Resolved  Chronic hepatitis B infection without, Pierce considered. No acute symptoms. Hepatitis C negative -Follow-up outpatient  Poor dentition Orthopantogram shows no fractures. -Follow-up with dentist outpatient  Chest pain Patient has a recent heart catheterization significant for normal coronary arteries. Troponin was borderline initially and has decreased. Likely secondary to pleuritic chest pain from embolic septic disease. -Continue Norco for pain  DVT prophylaxis: SCDs  Code Status: Full code  Family Communication: Father at bedside  Disposition Plan: Likely discharge to SNF to continue IV antibiotics once blood cultures were negative for 5 days per ID recommendations   Consultants:   Infectious disease  Cardiology  Cardiothoracic surgery  Procedures:   TEE (12/09/2015)  TTE (12/07/2015)  Antimicrobials:  Vancomycin (9/29>>   Subjective: Patient reports some chest pain that has remained persistent. No fevers, chills, nausea or vomiting.  Objective: Vitals:   12/09/15 2157 12/10/15 0449 12/10/15 0612 12/10/15 1504  BP: 113/65 108/61 107/66 112/60  Pulse: 90 79 80 94  Resp: 18 18 18 15   Temp: 98.8 F (37.1 C) 98.6 F (37 C) 98.2 F (36.8 C) 100.1 F (37.8 C)  TempSrc:   Oral Oral  SpO2: 97% 98% 97% 96%  Weight:      Height:        Intake/Output Summary (Last 24 hours) at 12/10/15 1526 Last data filed at 12/10/15 0438  Gross per 24 hour  Intake           899.33 ml  Output  0 ml  Net           899.33 ml   Filed Weights   12/05/15 1540  Weight: 58.7 kg (129 lb 6.6 oz)    Examination:  General exam: Appears calm and comfortable. Father at  bedside Respiratory system: Clear to auscultation. Respiratory effort normal. Cardiovascular system: S1 & S2 heard, RRR. 2/6 systolic murmur Gastrointestinal system: Abdomen is nondistended, soft and nontender. No organomegaly or masses felt. Normal bowel sounds heard. Central nervous system: Alert and oriented. No focal neurological deficits. Extremities: No edema. No calf tenderness Skin: No cyanosis. No rashes. No petechiae Psychiatry: Judgement and insight appear normal. Mood & affect appropriate.     Data Reviewed: I have personally reviewed following labs and imaging studies  CBC:  Recent Labs Lab 12/05/15 1600 12/06/15 0226 12/07/15 0757 12/08/15 1234 12/10/15 0457  WBC 13.6* 9.2 12.9* 12.7* 13.2*  NEUTROABS 10.6*  --   --   --   --   HGB 13.3 14.0 12.6* 15.1 11.9*  HCT 38.4* 40.9 37.6* 45.3 36.3*  MCV 80.5 82.5 81.6 83.1 84.0  PLT 92* 86* 158 207 [id number]   Basic Metabolic Panel:  Recent Labs Lab 12/04/15 1309 12/05/15 1600 12/06/15 0226 12/07/15 0757 12/08/15 1836 12/09/15 0444  NA 127* 129* 131* 136  --  136  K 3.6 3.7 3.9 3.3*  --  4.2  CL 92* 92* 100* 103  --  107  CO2 25 25 23 24   --  22  GLUCOSE 149* 171* 111* 109*  --  89  BUN 10 11 12 8   --  7  CREATININE 0.75 0.74 0.71 0.60*  --  0.63  CALCIUM 8.4* 8.6* 8.3* 8.2*  --  8.5*  MG  --   --   --   --  2.1 2.0   GFR: Estimated Creatinine Clearance: 120.3 mL/min (by C-G formula based on SCr of 0.63 mg/dL). Liver Function Tests:  Recent Labs Lab 12/04/15 1309 12/05/15 1600  AST 28 26  ALT 23 23  ALKPHOS 75 79  BILITOT 0.8 0.5  PROT 7.4 6.8  ALBUMIN 3.3* 2.8*    Recent Labs Lab 12/04/15 1309  LIPASE 17   No results for input(s): AMMONIA in the last 168 hours. Coagulation Profile: No results for input(s): INR, PROTIME in the last 168 hours. Cardiac Enzymes:  Recent Labs Lab 12/04/15 1309 12/05/15 1828 12/05/15 2336  CKTOTAL 12*  --   --   TROPONINI  --  0.03* <0.03   BNP (last 3  results) No results for input(s): PROBNP in the last 8760 hours. HbA1C: No results for input(s): HGBA1C in the last 72 hours. CBG: No results for input(s): GLUCAP in the last 168 hours. Lipid Profile: No results for input(s): CHOL, HDL, LDLCALC, TRIG, CHOLHDL, LDLDIRECT in the last 72 hours. Thyroid Function Tests: No results for input(s): TSH, T4TOTAL, FREET4, T3FREE, THYROIDAB in the last 72 hours. Anemia Panel: No results for input(s): VITAMINB12, FOLATE, FERRITIN, TIBC, IRON, RETICCTPCT in the last 72 hours. Sepsis Labs:  Recent Labs Lab 12/05/15 1844 12/05/15 2336 12/06/15 0226  LATICACIDVEN 3.0* 1.3 1.9    Recent Results (from the past 240 hour(s))  Culture, blood (routine x 2)     Status: Abnormal   Collection Time: 12/04/15  7:20 PM  Result Value Ref Range Status   Specimen Description BLOOD LEFT ANTECUBITAL  Final   Special Requests BOTTLES DRAWN AEROBIC AND ANAEROBIC 5 CC  Final   Culture  Setup Time  Final    GRAM POSITIVE COCCI IN CLUSTERS IN BOTH AEROBIC AND ANAEROBIC BOTTLES CRITICAL RESULT CALLED TO, READ BACK BY AND VERIFIED WITH: P CLARK,RN AT [id number] 12/05/15 BY L BENFIELD Performed at [facility] (A)  Final   Report Status 12/07/2015 FINAL  Final   Organism ID, Bacteria STAPHYLOCOCCUS AUREUS  Final      Susceptibility   Staphylococcus aureus - MIC*    CIPROFLOXACIN <=0.5 SENSITIVE Sensitive     ERYTHROMYCIN >=8 RESISTANT Resistant     GENTAMICIN <=0.5 SENSITIVE Sensitive     OXACILLIN <=0.25 SENSITIVE Sensitive     TETRACYCLINE <=1 SENSITIVE Sensitive     VANCOMYCIN 1 SENSITIVE Sensitive     TRIMETH/SULFA <=10 SENSITIVE Sensitive     CLINDAMYCIN <=0.25 SENSITIVE Sensitive     RIFAMPIN <=0.5 SENSITIVE Sensitive     Inducible Clindamycin NEGATIVE Sensitive     * STAPHYLOCOCCUS AUREUS  Blood Culture ID Panel (Reflexed)     Status: Abnormal   Collection Time: 12/04/15  7:20 PM  Result Value Ref Range Status    Enterococcus species NOT DETECTED NOT DETECTED Final   Listeria monocytogenes NOT DETECTED NOT DETECTED Final   Staphylococcus species DETECTED (A) NOT DETECTED Final    Comment: CRITICAL RESULT CALLED TO, READ BACK BY AND VERIFIED WITH: P CLARK,RN AT [id number] 12/05/15 BY L BENFIELD    Staphylococcus aureus DETECTED (A) NOT DETECTED Final    Comment: CRITICAL RESULT CALLED TO, READ BACK BY AND VERIFIED WITH: P CLARK,RN AT [id number] 12/05/15 BY L BENFIELD    Methicillin resistance NOT DETECTED NOT DETECTED Final   Streptococcus species NOT DETECTED NOT DETECTED Final   Streptococcus agalactiae NOT DETECTED NOT DETECTED Final   Streptococcus pneumoniae NOT DETECTED NOT DETECTED Final   Streptococcus pyogenes NOT DETECTED NOT DETECTED Final   Acinetobacter baumannii NOT DETECTED NOT DETECTED Final   Enterobacteriaceae species NOT DETECTED NOT DETECTED Final   Enterobacter cloacae complex NOT DETECTED NOT DETECTED Final   Escherichia coli NOT DETECTED NOT DETECTED Final   Klebsiella oxytoca NOT DETECTED NOT DETECTED Final   Klebsiella pneumoniae NOT DETECTED NOT DETECTED Final   Proteus species NOT DETECTED NOT DETECTED Final   Serratia marcescens NOT DETECTED NOT DETECTED Final   Haemophilus influenzae NOT DETECTED NOT DETECTED Final   Neisseria meningitidis NOT DETECTED NOT DETECTED Final   Pseudomonas aeruginosa NOT DETECTED NOT DETECTED Final   Candida albicans NOT DETECTED NOT DETECTED Final   Candida glabrata NOT DETECTED NOT DETECTED Final   Candida krusei NOT DETECTED NOT DETECTED Final   Candida parapsilosis NOT DETECTED NOT DETECTED Final   Candida tropicalis NOT DETECTED NOT DETECTED Final    Comment: Performed at [facility]  Culture, blood (routine x 2)     Status: Abnormal   Collection Time: 12/04/15  8:40 PM  Result Value Ref Range Status   Specimen Description BLOOD RIGHT HAND  Final   Special Requests BOTTLES DRAWN AEROBIC AND ANAEROBIC 5CC  Final   Culture  Setup Time    Final    GRAM POSITIVE COCCI IN CLUSTERS IN BOTH AEROBIC AND ANAEROBIC BOTTLES CRITICAL RESULT CALLED TO, READ BACK BY AND VERIFIED WITH: P CLARK,RN AT [id number] 12/05/15 BY L BENFIELD    Culture (A)  Final    STAPHYLOCOCCUS AUREUS SUSCEPTIBILITIES PERFORMED ON PREVIOUS CULTURE WITHIN THE LAST 5 DAYS. Performed at [facility]    Report Status 12/07/2015 FINAL  Final  MRSA PCR Screening  Status: None   Collection Time: 12/05/15  3:38 PM  Result Value Ref Range Status   MRSA by PCR NEGATIVE NEGATIVE Final    Comment:        The GeneXpert MRSA Assay (FDA approved for NASAL specimens only), is one component of a comprehensive MRSA colonization surveillance program. It is not intended to diagnose MRSA infection nor to guide or monitor treatment for MRSA infections.   Culture, blood (Routine X 2) w Reflex to ID Panel     Status: Abnormal   Collection Time: 12/07/15  5:30 PM  Result Value Ref Range Status   Specimen Description BLOOD RIGHT HAND  Final   Special Requests IN PEDIATRIC BOTTLE 1CC  Final   Culture  Setup Time   Final    GRAM POSITIVE COCCI IN CLUSTERS IN PEDIATRIC BOTTLE CRITICAL VALUE NOTED.  VALUE IS CONSISTENT WITH PREVIOUSLY REPORTED AND CALLED VALUE.    Culture (A)  Final    STAPHYLOCOCCUS AUREUS SUSCEPTIBILITIES PERFORMED ON PREVIOUS CULTURE WITHIN THE LAST 5 DAYS.    Report Status 12/10/2015 FINAL  Final  Culture, blood (Routine X 2) w Reflex to ID Panel     Status: Abnormal   Collection Time: 12/07/15  5:30 PM  Result Value Ref Range Status   Specimen Description BLOOD LEFT HAND  Final   Special Requests IN PEDIATRIC BOTTLE 2CC  Final   Culture  Setup Time   Final    GRAM POSITIVE COCCI IN CLUSTERS IN PEDIATRIC BOTTLE CRITICAL RESULT CALLED TO, READ BACK BY AND VERIFIED WITH: Leonie Green Pharm.D. 11:20 12/08/15 ([email or web address])    Culture STAPHYLOCOCCUS AUREUS (A)  Final   Report Status 12/10/2015 FINAL  Final   Organism ID, Bacteria STAPHYLOCOCCUS  AUREUS  Final      Susceptibility   Staphylococcus aureus - MIC*    CIPROFLOXACIN <=0.5 SENSITIVE Sensitive     ERYTHROMYCIN >=8 RESISTANT Resistant     GENTAMICIN <=0.5 SENSITIVE Sensitive     OXACILLIN 0.5 SENSITIVE Sensitive     TETRACYCLINE <=1 SENSITIVE Sensitive     VANCOMYCIN <=0.5 SENSITIVE Sensitive     TRIMETH/SULFA <=10 SENSITIVE Sensitive     CLINDAMYCIN <=0.25 SENSITIVE Sensitive     RIFAMPIN <=0.5 SENSITIVE Sensitive     Inducible Clindamycin NEGATIVE Sensitive     * STAPHYLOCOCCUS AUREUS  Blood Culture ID Panel (Reflexed)     Status: Abnormal   Collection Time: 12/07/15  5:30 PM  Result Value Ref Range Status   Enterococcus species NOT DETECTED NOT DETECTED Final   Listeria monocytogenes NOT DETECTED NOT DETECTED Final   Staphylococcus species DETECTED (A) NOT DETECTED Final    Comment: CRITICAL RESULT CALLED TO, READ BACK BY AND VERIFIED WITH: MRadford Pax Pharm.D. 11:20 12/08/15 ([email or web address])    Staphylococcus aureus DETECTED (A) NOT DETECTED Final    Comment: CRITICAL RESULT CALLED TO, READ BACK BY AND VERIFIED WITH: M. Radford Pax Pharm.D. 11:20 12/08/15 ([email or web address])    Methicillin resistance NOT DETECTED NOT DETECTED Final   Streptococcus species NOT DETECTED NOT DETECTED Final   Streptococcus agalactiae NOT DETECTED NOT DETECTED Final   Streptococcus pneumoniae NOT DETECTED NOT DETECTED Final   Streptococcus pyogenes NOT DETECTED NOT DETECTED Final   Acinetobacter baumannii NOT DETECTED NOT DETECTED Final   Enterobacteriaceae species NOT DETECTED NOT DETECTED Final   Enterobacter cloacae complex NOT DETECTED NOT DETECTED Final   Escherichia coli NOT DETECTED NOT DETECTED Final   Klebsiella oxytoca NOT DETECTED NOT DETECTED Final   Klebsiella pneumoniae NOT DETECTED NOT  DETECTED Final   Proteus species NOT DETECTED NOT DETECTED Final   Serratia marcescens NOT DETECTED NOT DETECTED Final   Haemophilus influenzae NOT DETECTED NOT DETECTED Final   Neisseria meningitidis NOT  DETECTED NOT DETECTED Final   Pseudomonas aeruginosa NOT DETECTED NOT DETECTED Final   Candida albicans NOT DETECTED NOT DETECTED Final   Candida glabrata NOT DETECTED NOT DETECTED Final   Candida krusei NOT DETECTED NOT DETECTED Final   Candida parapsilosis NOT DETECTED NOT DETECTED Final   Candida tropicalis NOT DETECTED NOT DETECTED Final  Culture, blood (Routine X 2) w Reflex to ID Panel     Status: None (Preliminary result)   Collection Time: 12/09/15  9:40 AM  Result Value Ref Range Status   Specimen Description BLOOD LEFT HAND  Final   Special Requests IN PEDIATRIC BOTTLE 4CC  Final   Culture  Setup Time   Final    GRAM POSITIVE COCCI IN CLUSTERS AEROBIC BOTTLE ONLY CRITICAL RESULT CALLED TO, READ BACK BY AND VERIFIED WITH: M TURNER 12/10/15 @ 1027 M VESTAL    Culture GRAM POSITIVE COCCI  Final   Report Status PENDING  Incomplete         Radiology Studies: Dg Orthopantogram  Result Date: 12/10/2015 CLINICAL DATA:  Bacteremia.  Jaw pain. EXAM: ORTHOPANTOGRAM/PANORAMIC COMPARISON:  None. FINDINGS: There is no evidence of fracture or other bony abnormality. IMPRESSION: No definite abnormality involving the mandible. Electronically Signed   By: Marijo Conception, M.D.   On: 12/10/2015 07:37        Scheduled Meds: . vancomycin  1,500 mg Intravenous Q8H   Continuous Infusions: . sodium chloride 400 mL (12/09/15 1440)     LOS: 5 days     Cordelia Poche [facility] 12/10/2015, 3:26 PM Pager: [phone]  If 7PM-7AM, please contact night-coverage [email or web address] Password [email or web address] 12/10/2015, 3:26 PM

## 2015-12-11 DIAGNOSIS — B182 Chronic viral hepatitis C: Secondary | ICD-10-CM

## 2015-12-11 LAB — QUANTIFERON IN TUBE
QFT TB AG MINUS NIL VALUE: <0 IU/mL
QUANTIFERON MITOGEN VALUE: 0.13 IU/mL
QUANTIFERON TB AG VALUE: 0.01 IU/mL
QUANTIFERON TB GOLD: UNDETERMINED
Quantiferon Nil Value: 0.02 IU/mL

## 2015-12-11 LAB — QUANTIFERON TB GOLD ASSAY (BLOOD)

## 2015-12-11 MED ORDER — KETOROLAC TROMETHAMINE 30 MG/ML IJ SOLN
30.0000 mg | Freq: Three times a day (TID) | INTRAMUSCULAR | Status: DC | PRN
Start: 2015-12-11 — End: 2015-12-12
  Administered 2015-12-11: 30 mg via INTRAVENOUS
  Filled 2015-12-11: qty 1

## 2015-12-11 NOTE — Progress Notes (Signed)
TELEMETRY: Reviewed telemetry pt in NSR: Vitals:   12/10/15 0612 12/10/15 1504 12/10/15 2108 12/11/15 0607  BP: 107/66 112/60 107/69 114/65  Pulse: 80 94 95 78  Resp: 18 15 18 18   Temp: 98.2 F (36.8 C) 100.1 F (37.8 C) 98.4 F (36.9 C) 98.4 F (36.9 C)  TempSrc: Oral Oral Oral Oral  SpO2: 97% 96% 98% 97%  Weight:      Height:        Intake/Output Summary (Last 24 hours) at 12/11/15 1123 Last data filed at 12/11/15 0900  Gross per 24 hour  Intake              200 ml  Output                0 ml  Net              200 ml   Filed Weights   12/05/15 1540  Weight: 129 lb 6.6 oz (58.7 kg)    Subjective  Still reports some left sided chest pain, but is no worse today. Resting comfortably in the bed.  Marland Kitchen  ceFAZolin (ANCEF) IV  2 g Intravenous Q8H   . sodium chloride 400 mL (12/09/15 1440)    LABS: Basic Metabolic Panel:  Recent Labs  12/08/15 1836 12/09/15 0444  NA  --  136  K  --  4.2  CL  --  107  CO2  --  22  GLUCOSE  --  89  BUN  --  7  CREATININE  --  0.63  CALCIUM  --  8.5*  MG 2.1 2.0   Liver Function Tests: No results for input(s): AST, ALT, ALKPHOS, BILITOT, PROT, ALBUMIN in the last 72 hours. No results for input(s): LIPASE, AMYLASE in the last 72 hours. CBC:  Recent Labs  12/08/15 1234 12/10/15 0457  WBC 12.7* 13.2*  HGB 15.1 11.9*  HCT 45.3 36.3*  MCV 83.1 84.0  PLT 207 239   Cardiac Enzymes: No results for input(s): CKTOTAL, CKMB, CKMBINDEX, TROPONINI in the last 72 hours. BNP: No results for input(s): PROBNP in the last 72 hours. D-Dimer: No results for input(s): DDIMER in the last 72 hours. Hemoglobin A1C: No results for input(s): HGBA1C in the last 72 hours. Fasting Lipid Panel: No results for input(s): CHOL, HDL, LDLCALC, TRIG, CHOLHDL, LDLDIRECT in the last 72 hours. Thyroid Function Tests: No results for input(s): TSH, T4TOTAL, T3FREE, THYROIDAB in the last 72 hours.  Invalid input(s): FREET3   Radiology/Studies:  Dg  Orthopantogram  Result Date: 12/10/2015 CLINICAL DATA:  Bacteremia.  Jaw pain. EXAM: ORTHOPANTOGRAM/PANORAMIC COMPARISON:  None. FINDINGS: There is no evidence of fracture or other bony abnormality. IMPRESSION: No definite abnormality involving the mandible. Electronically Signed   By: Marijo Conception, M.D.   On: 12/10/2015 07:37    PHYSICAL EXAM General: Well developed, well nourished, in no acute distress. Head: Normal Neck: Negative for carotid bruits. JVD not elevated. No adenopathy Lungs: Clear bilaterally to auscultation without wheezes, rales, or rhonchi. Breathing is unlabored. Heart: RRR S1 S2 with gr 2/6 systolic murmur LSB Abdomen: Soft, non-tender, non-distended with normoactive bowel sounds. No hepatomegaly. No rebound/guarding. No obvious abdominal masses. Msk:  Strength and tone appears normal for age. Extremities: No clubbing, cyanosis or edema.  Distal pedal pulses are 2+ and equal bilaterally. Left ankle without swelling, nodules, or rash. Neuro: Alert and oriented X 3. Moves all extremities spontaneously. Psych:  Responds to questions appropriately with a normal affect.  ASSESSMENT AND PLAN: 1. Tricuspid valve endocarditis in setting of methicillin sensitive Staphylococcus aureus bacteremia - He continues to use IV drug abuse. TTE done on 12/07/2015 showed left ventricular function of 50-55%, no wall motion abnormality with vegetation on tricuspid valve with diameter of 2 cm, moderate TR with PASP estimated 25 mmHg. CT angiogram of chest showed septic emboli and pneumonia. TEE  showed large vegetation involving 2 of the leaflet of tricuspid valve with moderate TR. Normal LV function. No thrombus or PFO.  Needs 6 weeks of IV antibiotics. CT surgery consulted. No indication for surgery at this point. BC  From 10/4 pending still. Continue IV antibiotics per ID. Would repeat Echo in one week   2. Septic Embolic - Due to endocarditis  3. IVDU - Strongly encouraged  discontinuation.   Present on Admission: . Benzodiazepine dependence ([place]) . Bacteremia . Hyponatremia . Thrombocytopenia ([place]) . IVDU (intravenous drug user) . Leukocytosis . Viral hepatitis B chronic ([place]) . Chest pain . Staphylococcus aureus bacteremia . Septic pulmonary embolism ([place]) . Acute bacterial endocarditis . Tricuspid regurgitation   Signed, Reino Bellis, NP -C 12/11/2015 11:23 AM Patient seen and examined and history reviewed. Agree with above findings and plan. Still complains of chest pain worse with deep breath. Overall no change. Afebrile. No change in systolic murmur. Plan to repeat Echo 12/14/15.  Peter Martinique, [place] 12/11/2015 11:49 AM

## 2015-12-11 NOTE — Progress Notes (Signed)
Subjective: No new complaints he is tolerating cephalosporins without difficulty   Antibiotics:  Anti-infectives    Start     Dose/Rate Route Frequency Ordered Stop   12/10/15 1700  ceFAZolin (ANCEF) IVPB 2g/100 mL premix     2 g 200 mL/hr over 30 Minutes Intravenous Every 8 hours 12/10/15 1642     12/08/15 2200  vancomycin (VANCOCIN) 1,500 mg in sodium chloride 0.9 % 500 mL IVPB  Status:  Discontinued     1,500 mg 250 mL/hr over 120 Minutes Intravenous Every 8 hours 12/08/15 1447 12/10/15 1640   12/07/15 1000  vancomycin (VANCOCIN) 1,750 mg in sodium chloride 0.9 % 500 mL IVPB  Status:  Discontinued     1,750 mg 250 mL/hr over 120 Minutes Intravenous Every 8 hours 12/07/15 0947 12/07/15 0957   12/07/15 1000  vancomycin (VANCOCIN) IVPB 1000 mg/200 mL premix  Status:  Discontinued     1,000 mg 200 mL/hr over 60 Minutes Intravenous Every 6 hours 12/07/15 0958 12/08/15 1447   12/06/15 0100  vancomycin (VANCOCIN) IVPB 750 mg/150 ml premix  Status:  Discontinued     750 mg 150 mL/hr over 60 Minutes Intravenous Every 8 hours 12/05/15 1605 12/07/15 0947   12/05/15 1615  vancomycin (VANCOCIN) IVPB 1000 mg/200 mL premix     1,000 mg 200 mL/hr over 60 Minutes Intravenous  Once 12/05/15 1605 12/05/15 2008      Medications: Scheduled Meds: .  ceFAZolin (ANCEF) IV  2 g Intravenous Q8H   Continuous Infusions: . sodium chloride 400 mL (12/09/15 1440)   PRN Meds:.acetaminophen **OR** acetaminophen, bisacodyl, HYDROcodone-acetaminophen, ketorolac, ondansetron **OR** ondansetron (ZOFRAN) IV, traZODone    Objective: Weight change:   Intake/Output Summary (Last 24 hours) at 12/11/15 2040 Last data filed at 12/11/15 0900  Gross per 24 hour  Intake              200 ml  Output                0 ml  Net              200 ml   Blood pressure (!) 123/59, pulse 82, temperature 98.2 F (36.8 C), temperature source Oral, resp. rate 18, height 5\' 6"  (1.676 m), weight 129 lb 6.6 oz  (58.7 kg), SpO2 99 %. Temp:  [98.2 F (36.8 C)-98.4 F (36.9 C)] 98.2 F (36.8 C) (10/05 1413) Pulse Rate:  [78-95] 82 (10/05 1413) Resp:  [18] 18 (10/05 1413) BP: (107-123)/(59-69) 123/59 (10/05 1413) SpO2:  [97 %-99 %] 99 % (10/05 1413)  Physical Exam: General: Alert and awake, oriented x3, not in any acute distress. HEENT: anicteric sclera,  Conjunctiva are clear ,EOMI, oropharynx clear and without exudate, Cardiovascular: tachycardic rate, normal r,  no murmur rubs or gallops Pulmonary: clear to auscultation bilaterally, no wheezing, rales or rhonchi, really having full breaths due to pleuritic chest pain Gastrointestinal: soft nontender, nondistended, normal bowel sounds, Musculoskeletal: no  clubbing or edema noted bilaterally,  Skin, soft tissue: no rashes, tattoos present, or Janeway lesions splitter hemorrhages or Osler's nodes Neuro: nonfocal, strength and sensation intact  CBC:  CBC Latest Ref Rng & Units 12/10/2015 12/08/2015 12/07/2015  WBC 4.0 - 10.5 K/uL 13.2(H) 12.7(H) 12.9(H)  Hemoglobin 13.0 - 17.0 g/dL 11.9(L) 15.1 12.6(L)  Hematocrit 39.0 - 52.0 % 36.3(L) 45.3 37.6(L)  Platelets 150 - 400 K/uL 239 207 158     BMET  Recent Labs  12/09/15 0444  NA  136  K 4.2  CL 107  CO2 22  GLUCOSE 89  BUN 7  CREATININE 0.63  CALCIUM 8.5*     Liver Panel  No results for input(s): PROT, ALBUMIN, AST, ALT, ALKPHOS, BILITOT, BILIDIR, IBILI in the last 72 hours.     Sedimentation Rate No results for input(s): ESRSEDRATE in the last 72 hours. C-Reactive Protein No results for input(s): CRP in the last 72 hours.  Micro Results: Recent Results (from the past 720 hour(s))  Culture, blood (routine x 2)     Status: Abnormal   Collection Time: 12/04/15  7:20 PM  Result Value Ref Range Status   Specimen Description BLOOD LEFT ANTECUBITAL  Final   Special Requests BOTTLES DRAWN AEROBIC AND ANAEROBIC 5 CC  Final   Culture  Setup Time   Final    GRAM POSITIVE COCCI  IN CLUSTERS IN BOTH AEROBIC AND ANAEROBIC BOTTLES CRITICAL RESULT CALLED TO, READ BACK BY AND VERIFIED WITH: P CLARK,RN AT [id number] 12/05/15 BY L BENFIELD Performed at [facility] (A)  Final   Report Status 12/07/2015 FINAL  Final   Organism ID, Bacteria STAPHYLOCOCCUS AUREUS  Final      Susceptibility   Staphylococcus aureus - MIC*    CIPROFLOXACIN <=0.5 SENSITIVE Sensitive     ERYTHROMYCIN >=8 RESISTANT Resistant     GENTAMICIN <=0.5 SENSITIVE Sensitive     OXACILLIN <=0.25 SENSITIVE Sensitive     TETRACYCLINE <=1 SENSITIVE Sensitive     VANCOMYCIN 1 SENSITIVE Sensitive     TRIMETH/SULFA <=10 SENSITIVE Sensitive     CLINDAMYCIN <=0.25 SENSITIVE Sensitive     RIFAMPIN <=0.5 SENSITIVE Sensitive     Inducible Clindamycin NEGATIVE Sensitive     * STAPHYLOCOCCUS AUREUS  Blood Culture ID Panel (Reflexed)     Status: Abnormal   Collection Time: 12/04/15  7:20 PM  Result Value Ref Range Status   Enterococcus species NOT DETECTED NOT DETECTED Final   Listeria monocytogenes NOT DETECTED NOT DETECTED Final   Staphylococcus species DETECTED (A) NOT DETECTED Final    Comment: CRITICAL RESULT CALLED TO, READ BACK BY AND VERIFIED WITH: P CLARK,RN AT [id number] 12/05/15 BY L BENFIELD    Staphylococcus aureus DETECTED (A) NOT DETECTED Final    Comment: CRITICAL RESULT CALLED TO, READ BACK BY AND VERIFIED WITH: P CLARK,RN AT [id number] 12/05/15 BY L BENFIELD    Methicillin resistance NOT DETECTED NOT DETECTED Final   Streptococcus species NOT DETECTED NOT DETECTED Final   Streptococcus agalactiae NOT DETECTED NOT DETECTED Final   Streptococcus pneumoniae NOT DETECTED NOT DETECTED Final   Streptococcus pyogenes NOT DETECTED NOT DETECTED Final   Acinetobacter baumannii NOT DETECTED NOT DETECTED Final   Enterobacteriaceae species NOT DETECTED NOT DETECTED Final   Enterobacter cloacae complex NOT DETECTED NOT DETECTED Final   Escherichia coli NOT DETECTED NOT DETECTED  Final   Klebsiella oxytoca NOT DETECTED NOT DETECTED Final   Klebsiella pneumoniae NOT DETECTED NOT DETECTED Final   Proteus species NOT DETECTED NOT DETECTED Final   Serratia marcescens NOT DETECTED NOT DETECTED Final   Haemophilus influenzae NOT DETECTED NOT DETECTED Final   Neisseria meningitidis NOT DETECTED NOT DETECTED Final   Pseudomonas aeruginosa NOT DETECTED NOT DETECTED Final   Candida albicans NOT DETECTED NOT DETECTED Final   Candida glabrata NOT DETECTED NOT DETECTED Final   Candida krusei NOT DETECTED NOT DETECTED Final   Candida parapsilosis NOT DETECTED NOT DETECTED Final   Candida tropicalis NOT DETECTED NOT  DETECTED Final    Comment: Performed at [facility]  Culture, blood (routine x 2)     Status: Abnormal   Collection Time: 12/04/15  8:40 PM  Result Value Ref Range Status   Specimen Description BLOOD RIGHT HAND  Final   Special Requests BOTTLES DRAWN AEROBIC AND ANAEROBIC 5CC  Final   Culture  Setup Time   Final    GRAM POSITIVE COCCI IN CLUSTERS IN BOTH AEROBIC AND ANAEROBIC BOTTLES CRITICAL RESULT CALLED TO, READ BACK BY AND VERIFIED WITH: P CLARK,RN AT [id number] 12/05/15 BY L BENFIELD    Culture (A)  Final    STAPHYLOCOCCUS AUREUS SUSCEPTIBILITIES PERFORMED ON PREVIOUS CULTURE WITHIN THE LAST 5 DAYS. Performed at [facility]    Report Status 12/07/2015 FINAL  Final  MRSA PCR Screening     Status: None   Collection Time: 12/05/15  3:38 PM  Result Value Ref Range Status   MRSA by PCR NEGATIVE NEGATIVE Final    Comment:        The GeneXpert MRSA Assay (FDA approved for NASAL specimens only), is one component of a comprehensive MRSA colonization surveillance program. It is not intended to diagnose MRSA infection nor to guide or monitor treatment for MRSA infections.   Culture, blood (Routine X 2) w Reflex to ID Panel     Status: Abnormal   Collection Time: 12/07/15  5:30 PM  Result Value Ref Range Status   Specimen Description BLOOD RIGHT  HAND  Final   Special Requests IN PEDIATRIC BOTTLE 1CC  Final   Culture  Setup Time   Final    GRAM POSITIVE COCCI IN CLUSTERS IN PEDIATRIC BOTTLE CRITICAL VALUE NOTED.  VALUE IS CONSISTENT WITH PREVIOUSLY REPORTED AND CALLED VALUE.    Culture (A)  Final    STAPHYLOCOCCUS AUREUS SUSCEPTIBILITIES PERFORMED ON PREVIOUS CULTURE WITHIN THE LAST 5 DAYS.    Report Status 12/10/2015 FINAL  Final  Culture, blood (Routine X 2) w Reflex to ID Panel     Status: Abnormal   Collection Time: 12/07/15  5:30 PM  Result Value Ref Range Status   Specimen Description BLOOD LEFT HAND  Final   Special Requests IN PEDIATRIC BOTTLE 2CC  Final   Culture  Setup Time   Final    GRAM POSITIVE COCCI IN CLUSTERS IN PEDIATRIC BOTTLE CRITICAL RESULT CALLED TO, READ BACK BY AND VERIFIED WITH: Leonie Green Pharm.D. 11:20 12/08/15 ([email or web address])    Culture STAPHYLOCOCCUS AUREUS (A)  Final   Report Status 12/10/2015 FINAL  Final   Organism ID, Bacteria STAPHYLOCOCCUS AUREUS  Final      Susceptibility   Staphylococcus aureus - MIC*    CIPROFLOXACIN <=0.5 SENSITIVE Sensitive     ERYTHROMYCIN >=8 RESISTANT Resistant     GENTAMICIN <=0.5 SENSITIVE Sensitive     OXACILLIN 0.5 SENSITIVE Sensitive     TETRACYCLINE <=1 SENSITIVE Sensitive     VANCOMYCIN <=0.5 SENSITIVE Sensitive     TRIMETH/SULFA <=10 SENSITIVE Sensitive     CLINDAMYCIN <=0.25 SENSITIVE Sensitive     RIFAMPIN <=0.5 SENSITIVE Sensitive     Inducible Clindamycin NEGATIVE Sensitive     * STAPHYLOCOCCUS AUREUS  Blood Culture ID Panel (Reflexed)     Status: Abnormal   Collection Time: 12/07/15  5:30 PM  Result Value Ref Range Status   Enterococcus species NOT DETECTED NOT DETECTED Final   Listeria monocytogenes NOT DETECTED NOT DETECTED Final   Staphylococcus species DETECTED (A) NOT DETECTED Final    Comment: CRITICAL RESULT  CALLED TO, READ BACK BY AND VERIFIED WITH: M. Radford Pax Pharm.D. 11:20 12/08/15 ([email or web address])    Staphylococcus aureus DETECTED (A) NOT  DETECTED Final    Comment: CRITICAL RESULT CALLED TO, READ BACK BY AND VERIFIED WITH: M. Radford Pax Pharm.D. 11:20 12/08/15 ([email or web address])    Methicillin resistance NOT DETECTED NOT DETECTED Final   Streptococcus species NOT DETECTED NOT DETECTED Final   Streptococcus agalactiae NOT DETECTED NOT DETECTED Final   Streptococcus pneumoniae NOT DETECTED NOT DETECTED Final   Streptococcus pyogenes NOT DETECTED NOT DETECTED Final   Acinetobacter baumannii NOT DETECTED NOT DETECTED Final   Enterobacteriaceae species NOT DETECTED NOT DETECTED Final   Enterobacter cloacae complex NOT DETECTED NOT DETECTED Final   Escherichia coli NOT DETECTED NOT DETECTED Final   Klebsiella oxytoca NOT DETECTED NOT DETECTED Final   Klebsiella pneumoniae NOT DETECTED NOT DETECTED Final   Proteus species NOT DETECTED NOT DETECTED Final   Serratia marcescens NOT DETECTED NOT DETECTED Final   Haemophilus influenzae NOT DETECTED NOT DETECTED Final   Neisseria meningitidis NOT DETECTED NOT DETECTED Final   Pseudomonas aeruginosa NOT DETECTED NOT DETECTED Final   Candida albicans NOT DETECTED NOT DETECTED Final   Candida glabrata NOT DETECTED NOT DETECTED Final   Candida krusei NOT DETECTED NOT DETECTED Final   Candida parapsilosis NOT DETECTED NOT DETECTED Final   Candida tropicalis NOT DETECTED NOT DETECTED Final  Culture, blood (Routine X 2) w Reflex to ID Panel     Status: Abnormal (Preliminary result)   Collection Time: 12/09/15  9:40 AM  Result Value Ref Range Status   Specimen Description BLOOD LEFT HAND  Final   Special Requests IN PEDIATRIC BOTTLE 4CC  Final   Culture  Setup Time   Final    GRAM POSITIVE COCCI IN CLUSTERS AEROBIC BOTTLE ONLY CRITICAL RESULT CALLED TO, READ BACK BY AND VERIFIED WITH: M TURNER 12/10/15 @ 97 M VESTAL    Culture (A)  Final    STAPHYLOCOCCUS AUREUS SUSCEPTIBILITIES TO FOLLOW    Report Status PENDING  Incomplete  Culture, blood (Routine X 2) w Reflex to ID Panel     Status: None  (Preliminary result)   Collection Time: 12/09/15  9:55 AM  Result Value Ref Range Status   Specimen Description BLOOD RIGHT HAND  Final   Special Requests IN PEDIATRIC BOTTLE 1CC  Final   Culture NO GROWTH 2 DAYS  Final   Report Status PENDING  Incomplete  Culture, blood (routine x 2)     Status: None (Preliminary result)   Collection Time: 12/10/15  4:17 PM  Result Value Ref Range Status   Specimen Description BLOOD RIGHT HAND  Final   Special Requests BOTTLES DRAWN AEROBIC ONLY 5CC  Final   Culture NO GROWTH < 24 HOURS  Final   Report Status PENDING  Incomplete  Culture, blood (routine x 2)     Status: None (Preliminary result)   Collection Time: 12/10/15  4:22 PM  Result Value Ref Range Status   Specimen Description BLOOD RIGHT ARM  Final   Special Requests BOTTLES DRAWN AEROBIC ONLY 5CC  Final   Culture NO GROWTH < 24 HOURS  Final   Report Status PENDING  Incomplete    Studies/Results: Dg Orthopantogram  Result Date: 12/10/2015 CLINICAL DATA:  Bacteremia.  Jaw pain. EXAM: ORTHOPANTOGRAM/PANORAMIC COMPARISON:  None. FINDINGS: There is no evidence of fracture or other bony abnormality. IMPRESSION: No definite abnormality involving the mandible. Electronically Signed   By: Marijo Conception, M.D.   On:  12/10/2015 07:37      Assessment/Plan:  INTERVAL HISTORY: TEE with LARGE vegetation on TWO leaflets of TV  SEEN BY CVTS AND CARDIOLOGY FORMALLY  PTS MOM STATES HIS TOP LIP SWELLED ON PCN AS A CHILD, PATIENT WILLING TO TRIAL OF CEPHALOSPORIN   Principal Problem:   Bacteremia Active Problems:   Benzodiazepine dependence ([place])   IVDU (intravenous drug user)   Viral hepatitis B chronic (HCC)   Leukocytosis   Hyponatremia   Thrombocytopenia (HCC)   Chest pain   Staphylococcus aureus bacteremia   Septic pulmonary embolism (HCC)   Hypokalemia   Acute bacterial endocarditis   Tricuspid regurgitation    CONER KALLMEYER is a 20 y.o. male with 20 y.o. 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 MSSA right-sided endocarditis:                                             [place] [facility] Staphylococcus aureus bacteremia  Staphylococcus aureus bacteremia (SAB) is associated with a high rate of complications and mortality.  Specific aspects of clinical management are critical to optimizing the outcome of patients with SAB.  Therefore, the [facility] Antimicrobial Management Team [facility]) has initiated an intervention aimed at improving the management of SAB at [facility].  To do so, Infectious Diseases physicians are providing an evidence-based consult for the management of all patients with SAB.     Yes No Comments  Perform follow-up blood cultures (even if the patient is afebrile) to ensure clearance of bacteremia [x]   BLOOD CULTURES have been  PERSISTENTLY POSITIVE  Ones from 12/10/15 are NGTD   Remove vascular catheter and obtain follow-up blood cultures after the removal of the catheter []  []  DO NOT PLACE PICC LINE OR CENTRAL LINE UNTIL CULTURES FROM TODAY ARE NO GROWTH X 4-5 DAYS  Perform echocardiography to evaluate for endocarditis (transthoracic ECHO is 40-50% sensitive, TEE is > 90% sensitive) []  []  TEE with large vegetation on 2 leaflets of TV       Ensure source control []  []  Have all abscesses been drained effectively? Have deep seeded infections (septic joints or osteomyelitis) had appropriate surgical debridement? Source seems to be his injection drug use   Investigate for "metastatic" sites of infection []  []  Does the patient have ANY symptom or physical exam finding that would suggest a deeper infection (back or neck pain that may be suggestive of vertebral osteomyelitis or epidural abscess, muscle pain that could be a symptom of pyomyositis)?  Keep in mind that for deep seeded infections MRI imaging with contrast is  preferred rather than other often insensitive tests such as plain x-rays, especially early in a patient's presentation.   No clinical signs pointing to and others metastatic site of infection besides the lungs at this point   Trial of ANCEF 2G IV Q 8 HOURS [x]  []  Beta-lactam antibiotics are preferred for MSSA due to higher cure rates.   If on Vancomycin, goal trough should be 15 - 20 mcg/mL  Estimated duration of IV antibiotic therapy:  6 weeks but would need to be in a skilled nursing facility  [x]  []  Consult case management for probably prolonged outpatient IV antibiotic therapy    #2 Chronic hepatitis be without hepatic coma: I would are you  that there is a good reason to consider treating him with anti-hepatitis B therapy in particular if there is any suspicion of shared needles as he could spread hep B in the community if he shares his needles with fellow IV drug users. He denied using shared needles but it is something to consider. I don't think he needs screening for hepatocellular carcinoma at this young age  #3 Intravenous drug use: Clearly he needs to being gauged in a opiate replacement plan to treat his injection drug use as are hisa for a to administer intravenous drugs and to avoid further episodes of infectious endocarditis from injection drug use. He says he did not like methadone at all suboxone was "ok"   #4 cracked tooth and problems with dentition: Consider Panorex and he will need to be plugged into the dental as an outpatient. Proper dental hygiene is clearly also critical given his now documented history of endocarditis he will also need antecedent antibiotics prior to invasive dental procedures.   LOS: 6 days   Alcide Evener 12/11/2015, 8:40 PM

## 2015-12-11 NOTE — Progress Notes (Signed)
PROGRESS NOTE    Erik [name]  [id number] DOB: 1995-11-15 DOA: 12/05/2015 PCP: Gwendolyn Grant, MD   Brief Narrative: Erik [name] is a 20 y.o. male with a history of intravenous drug use, anxiety, endocarditis, chronic hepatitis B presented after being informed of positive blood cultures that was obtained on 12/04/2015. Preliminary results show Staphylococcus aureus in 2 out of 2 sets. The patient initially presented to the emergency department on 12/04/2015 with chest pain and shortness of breath. The patient was given a dose of levofloxacin and discharged from the emergency department after blood cultures were obtained. The patient states that he last used heroin approximately 2 weeks prior to this admission. during his admission from 02/22/2015 through 02/26/2015,, the patient was admitted for treatment of rhabdomyolysis, cellulitisof bilateral feet, and elevated troponins. The patient also has had a previous admission back in September 2016 for narcotic induced respiratory failure requiring intubation. In addition, the patient has had a number of previous admissions in the past for narcotic and other illicit drug overdose.The patient was started on intravenous vancomycin and admitted for further evaluation of his bacteremia.   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 Blood cultures still growing bacteria. Large vegetation seen on 2 tricuspid valve leaflets on TEE. Apparently patient has had angioedema secondary to penicillin exposure. Cardiothoracic surgery  evaluated sided no surgery at this time. -cefazolin per infectious disease recommendations  Pulmonary opacities Septic emboli Secondary to MSSA bacteremia. Patient is symptomatic with pleuritic chest pain.  Hyponatremia Resolved  Thrombocytopenia Resolved  Chronic hepatitis B infection without, [place] considered. No acute symptoms. Hepatitis C negative -Follow-up outpatient  Poor dentition Orthopantogram shows no fractures. -Follow-up with dentist outpatient  Chest pain Patient has a recent heart catheterization significant for normal coronary arteries. Troponin was borderline initially and has decreased. Likely secondary to pleuritic chest pain from embolic septic disease. -Continue Norco for pain  DVT prophylaxis: SCDs  Code Status: Full code  Family Communication: Father at bedside  Disposition Plan: Likely discharge to SNF to continue IV antibiotics once blood cultures were negative for 5 days per ID recommendations   Consultants:   Infectious disease  Cardiology  Cardiothoracic surgery  Procedures:   TEE (12/09/2015)  TTE (12/07/2015)  Antimicrobials:  Vancomycin (9/29>>10/4)  Cefazolin (10/4>>   Subjective: Chest pain still present and bothers him a lot at night.  Objective: Vitals:   12/10/15 1504 12/10/15 2108 12/11/15 0607 12/11/15 1413  BP: 112/60 107/69 114/65 (!) 123/59  Pulse: 94 95 78 82  Resp: 15 18 18 18   Temp: 100.1 F (37.8 C) 98.4 F (36.9 C) 98.4 F (36.9 C) 98.2 F (36.8 C)  TempSrc: Oral Oral Oral Oral  SpO2: 96% 98% 97% 99%  Weight:      Height:        Intake/Output Summary (Last 24 hours) at 12/11/15 1542 Last data filed at 12/11/15 0900  Gross per 24 hour  Intake              200 ml  Output  0 ml  Net              200 ml   Filed Weights   12/05/15 1540  Weight: 58.7 kg (129 lb 6.6 oz)    Examination:  General exam: Appears calm and comfortable. Father at bedside Respiratory system: Clear to  auscultation. Respiratory effort normal. Cardiovascular system: S1 & S2 heard, RRR. 1/6 systolic murmur Gastrointestinal system: Abdomen is nondistended, soft and nontender. No organomegaly or masses felt. Normal bowel sounds heard. Central nervous system: Alert and oriented. No focal neurological deficits. Extremities: No edema. No calf tenderness Skin: No cyanosis. No rashes. No petechiae Psychiatry: Judgement and insight appear normal. Mood & affect appropriate.     Data Reviewed: I have personally reviewed following labs and imaging studies  CBC:  Recent Labs Lab 12/05/15 1600 12/06/15 0226 12/07/15 0757 12/08/15 1234 12/10/15 0457  WBC 13.6* 9.2 12.9* 12.7* 13.2*  NEUTROABS 10.6*  --   --   --   --   HGB 13.3 14.0 12.6* 15.1 11.9*  HCT 38.4* 40.9 37.6* 45.3 36.3*  MCV 80.5 82.5 81.6 83.1 84.0  PLT 92* 86* 158 207 [id number]   Basic Metabolic Panel:  Recent Labs Lab 12/05/15 1600 12/06/15 0226 12/07/15 0757 12/08/15 1836 12/09/15 0444  NA 129* 131* 136  --  136  K 3.7 3.9 3.3*  --  4.2  CL 92* 100* 103  --  107  CO2 25 23 24   --  22  GLUCOSE 171* 111* 109*  --  89  BUN 11 12 8   --  7  CREATININE 0.74 0.71 0.60*  --  0.63  CALCIUM 8.6* 8.3* 8.2*  --  8.5*  MG  --   --   --  2.1 2.0   GFR: Estimated Creatinine Clearance: 120.3 mL/min (by C-G formula based on SCr of 0.63 mg/dL). Liver Function Tests:  Recent Labs Lab 12/05/15 1600  AST 26  ALT 23  ALKPHOS 79  BILITOT 0.5  PROT 6.8  ALBUMIN 2.8*   No results for input(s): LIPASE, AMYLASE in the last 168 hours. No results for input(s): AMMONIA in the last 168 hours. Coagulation Profile: No results for input(s): INR, PROTIME in the last 168 hours. Cardiac Enzymes:  Recent Labs Lab 12/05/15 1828 12/05/15 2336  TROPONINI 0.03* <0.03   BNP (last 3 results) No results for input(s): PROBNP in the last 8760 hours. HbA1C: No results for input(s): HGBA1C in the last 72 hours. CBG: No results for input(s):  GLUCAP in the last 168 hours. Lipid Profile: No results for input(s): CHOL, HDL, LDLCALC, TRIG, CHOLHDL, LDLDIRECT in the last 72 hours. Thyroid Function Tests: No results for input(s): TSH, T4TOTAL, FREET4, T3FREE, THYROIDAB in the last 72 hours. Anemia Panel: No results for input(s): VITAMINB12, FOLATE, FERRITIN, TIBC, IRON, RETICCTPCT in the last 72 hours. Sepsis Labs:  Recent Labs Lab 12/05/15 1844 12/05/15 2336 12/06/15 0226  LATICACIDVEN 3.0* 1.3 1.9    Recent Results (from the past 240 hour(s))  Culture, blood (routine x 2)     Status: Abnormal   Collection Time: 12/04/15  7:20 PM  Result Value Ref Range Status   Specimen Description BLOOD LEFT ANTECUBITAL  Final   Special Requests BOTTLES DRAWN AEROBIC AND ANAEROBIC 5 CC  Final   Culture  Setup Time   Final    GRAM POSITIVE COCCI IN CLUSTERS IN BOTH AEROBIC AND ANAEROBIC BOTTLES CRITICAL RESULT CALLED TO, READ BACK BY AND VERIFIED WITH: P CLARK,RN AT [id number] 12/05/15  BY L BENFIELD Performed at [facility] (A)  Final   Report Status 12/07/2015 FINAL  Final   Organism ID, Bacteria STAPHYLOCOCCUS AUREUS  Final      Susceptibility   Staphylococcus aureus - MIC*    CIPROFLOXACIN <=0.5 SENSITIVE Sensitive     ERYTHROMYCIN >=8 RESISTANT Resistant     GENTAMICIN <=0.5 SENSITIVE Sensitive     OXACILLIN <=0.25 SENSITIVE Sensitive     TETRACYCLINE <=1 SENSITIVE Sensitive     VANCOMYCIN 1 SENSITIVE Sensitive     TRIMETH/SULFA <=10 SENSITIVE Sensitive     CLINDAMYCIN <=0.25 SENSITIVE Sensitive     RIFAMPIN <=0.5 SENSITIVE Sensitive     Inducible Clindamycin NEGATIVE Sensitive     * STAPHYLOCOCCUS AUREUS  Blood Culture ID Panel (Reflexed)     Status: Abnormal   Collection Time: 12/04/15  7:20 PM  Result Value Ref Range Status   Enterococcus species NOT DETECTED NOT DETECTED Final   Listeria monocytogenes NOT DETECTED NOT DETECTED Final   Staphylococcus species DETECTED (A) NOT DETECTED  Final    Comment: CRITICAL RESULT CALLED TO, READ BACK BY AND VERIFIED WITH: P CLARK,RN AT [id number] 12/05/15 BY L BENFIELD    Staphylococcus aureus DETECTED (A) NOT DETECTED Final    Comment: CRITICAL RESULT CALLED TO, READ BACK BY AND VERIFIED WITH: P CLARK,RN AT [id number] 12/05/15 BY L BENFIELD    Methicillin resistance NOT DETECTED NOT DETECTED Final   Streptococcus species NOT DETECTED NOT DETECTED Final   Streptococcus agalactiae NOT DETECTED NOT DETECTED Final   Streptococcus pneumoniae NOT DETECTED NOT DETECTED Final   Streptococcus pyogenes NOT DETECTED NOT DETECTED Final   Acinetobacter baumannii NOT DETECTED NOT DETECTED Final   Enterobacteriaceae species NOT DETECTED NOT DETECTED Final   Enterobacter cloacae complex NOT DETECTED NOT DETECTED Final   Escherichia coli NOT DETECTED NOT DETECTED Final   Klebsiella oxytoca NOT DETECTED NOT DETECTED Final   Klebsiella pneumoniae NOT DETECTED NOT DETECTED Final   Proteus species NOT DETECTED NOT DETECTED Final   Serratia marcescens NOT DETECTED NOT DETECTED Final   Haemophilus influenzae NOT DETECTED NOT DETECTED Final   Neisseria meningitidis NOT DETECTED NOT DETECTED Final   Pseudomonas aeruginosa NOT DETECTED NOT DETECTED Final   Candida albicans NOT DETECTED NOT DETECTED Final   Candida glabrata NOT DETECTED NOT DETECTED Final   Candida krusei NOT DETECTED NOT DETECTED Final   Candida parapsilosis NOT DETECTED NOT DETECTED Final   Candida tropicalis NOT DETECTED NOT DETECTED Final    Comment: Performed at [facility]  Culture, blood (routine x 2)     Status: Abnormal   Collection Time: 12/04/15  8:40 PM  Result Value Ref Range Status   Specimen Description BLOOD RIGHT HAND  Final   Special Requests BOTTLES DRAWN AEROBIC AND ANAEROBIC 5CC  Final   Culture  Setup Time   Final    GRAM POSITIVE COCCI IN CLUSTERS IN BOTH AEROBIC AND ANAEROBIC BOTTLES CRITICAL RESULT CALLED TO, READ BACK BY AND VERIFIED WITH: P CLARK,RN AT [id number]  12/05/15 BY L BENFIELD    Culture (A)  Final    STAPHYLOCOCCUS AUREUS SUSCEPTIBILITIES PERFORMED ON PREVIOUS CULTURE WITHIN THE LAST 5 DAYS. Performed at [facility]    Report Status 12/07/2015 FINAL  Final  MRSA PCR Screening     Status: None   Collection Time: 12/05/15  3:38 PM  Result Value Ref Range Status   MRSA by PCR NEGATIVE NEGATIVE Final  Comment:        The GeneXpert MRSA Assay (FDA approved for NASAL specimens only), is one component of a comprehensive MRSA colonization surveillance program. It is not intended to diagnose MRSA infection nor to guide or monitor treatment for MRSA infections.   Culture, blood (Routine X 2) w Reflex to ID Panel     Status: Abnormal   Collection Time: 12/07/15  5:30 PM  Result Value Ref Range Status   Specimen Description BLOOD RIGHT HAND  Final   Special Requests IN PEDIATRIC BOTTLE 1CC  Final   Culture  Setup Time   Final    GRAM POSITIVE COCCI IN CLUSTERS IN PEDIATRIC BOTTLE CRITICAL VALUE NOTED.  VALUE IS CONSISTENT WITH PREVIOUSLY REPORTED AND CALLED VALUE.    Culture (A)  Final    STAPHYLOCOCCUS AUREUS SUSCEPTIBILITIES PERFORMED ON PREVIOUS CULTURE WITHIN THE LAST 5 DAYS.    Report Status 12/10/2015 FINAL  Final  Culture, blood (Routine X 2) w Reflex to ID Panel     Status: Abnormal   Collection Time: 12/07/15  5:30 PM  Result Value Ref Range Status   Specimen Description BLOOD LEFT HAND  Final   Special Requests IN PEDIATRIC BOTTLE 2CC  Final   Culture  Setup Time   Final    GRAM POSITIVE COCCI IN CLUSTERS IN PEDIATRIC BOTTLE CRITICAL RESULT CALLED TO, READ BACK BY AND VERIFIED WITH: Leonie Green Pharm.D. 11:20 12/08/15 ([email or web address])    Culture STAPHYLOCOCCUS AUREUS (A)  Final   Report Status 12/10/2015 FINAL  Final   Organism ID, Bacteria STAPHYLOCOCCUS AUREUS  Final      Susceptibility   Staphylococcus aureus - MIC*    CIPROFLOXACIN <=0.5 SENSITIVE Sensitive     ERYTHROMYCIN >=8 RESISTANT Resistant      GENTAMICIN <=0.5 SENSITIVE Sensitive     OXACILLIN 0.5 SENSITIVE Sensitive     TETRACYCLINE <=1 SENSITIVE Sensitive     VANCOMYCIN <=0.5 SENSITIVE Sensitive     TRIMETH/SULFA <=10 SENSITIVE Sensitive     CLINDAMYCIN <=0.25 SENSITIVE Sensitive     RIFAMPIN <=0.5 SENSITIVE Sensitive     Inducible Clindamycin NEGATIVE Sensitive     * STAPHYLOCOCCUS AUREUS  Blood Culture ID Panel (Reflexed)     Status: Abnormal   Collection Time: 12/07/15  5:30 PM  Result Value Ref Range Status   Enterococcus species NOT DETECTED NOT DETECTED Final   Listeria monocytogenes NOT DETECTED NOT DETECTED Final   Staphylococcus species DETECTED (A) NOT DETECTED Final    Comment: CRITICAL RESULT CALLED TO, READ BACK BY AND VERIFIED WITH: MRadford Pax Pharm.D. 11:20 12/08/15 ([email or web address])    Staphylococcus aureus DETECTED (A) NOT DETECTED Final    Comment: CRITICAL RESULT CALLED TO, READ BACK BY AND VERIFIED WITH: M. Radford Pax Pharm.D. 11:20 12/08/15 ([email or web address])    Methicillin resistance NOT DETECTED NOT DETECTED Final   Streptococcus species NOT DETECTED NOT DETECTED Final   Streptococcus agalactiae NOT DETECTED NOT DETECTED Final   Streptococcus pneumoniae NOT DETECTED NOT DETECTED Final   Streptococcus pyogenes NOT DETECTED NOT DETECTED Final   Acinetobacter baumannii NOT DETECTED NOT DETECTED Final   Enterobacteriaceae species NOT DETECTED NOT DETECTED Final   Enterobacter cloacae complex NOT DETECTED NOT DETECTED Final   Escherichia coli NOT DETECTED NOT DETECTED Final   Klebsiella oxytoca NOT DETECTED NOT DETECTED Final   Klebsiella pneumoniae NOT DETECTED NOT DETECTED Final   Proteus species NOT DETECTED NOT DETECTED Final   Serratia marcescens NOT DETECTED NOT DETECTED Final   Haemophilus influenzae NOT DETECTED NOT  DETECTED Final   Neisseria meningitidis NOT DETECTED NOT DETECTED Final   Pseudomonas aeruginosa NOT DETECTED NOT DETECTED Final   Candida albicans NOT DETECTED NOT DETECTED Final   Candida glabrata  NOT DETECTED NOT DETECTED Final   Candida krusei NOT DETECTED NOT DETECTED Final   Candida parapsilosis NOT DETECTED NOT DETECTED Final   Candida tropicalis NOT DETECTED NOT DETECTED Final  Culture, blood (Routine X 2) w Reflex to ID Panel     Status: Abnormal (Preliminary result)   Collection Time: 12/09/15  9:40 AM  Result Value Ref Range Status   Specimen Description BLOOD LEFT HAND  Final   Special Requests IN PEDIATRIC BOTTLE 4CC  Final   Culture  Setup Time   Final    GRAM POSITIVE COCCI IN CLUSTERS AEROBIC BOTTLE ONLY CRITICAL RESULT CALLED TO, READ BACK BY AND VERIFIED WITH: M TURNER 12/10/15 @ 58 M VESTAL    Culture (A)  Final    STAPHYLOCOCCUS AUREUS SUSCEPTIBILITIES TO FOLLOW    Report Status PENDING  Incomplete  Culture, blood (Routine X 2) w Reflex to ID Panel     Status: None (Preliminary result)   Collection Time: 12/09/15  9:55 AM  Result Value Ref Range Status   Specimen Description BLOOD RIGHT HAND  Final   Special Requests IN PEDIATRIC BOTTLE 1CC  Final   Culture NO GROWTH 2 DAYS  Final   Report Status PENDING  Incomplete  Culture, blood (routine x 2)     Status: None (Preliminary result)   Collection Time: 12/10/15  4:17 PM  Result Value Ref Range Status   Specimen Description BLOOD RIGHT HAND  Final   Special Requests BOTTLES DRAWN AEROBIC ONLY 5CC  Final   Culture NO GROWTH < 24 HOURS  Final   Report Status PENDING  Incomplete  Culture, blood (routine x 2)     Status: None (Preliminary result)   Collection Time: 12/10/15  4:22 PM  Result Value Ref Range Status   Specimen Description BLOOD RIGHT ARM  Final   Special Requests BOTTLES DRAWN AEROBIC ONLY 5CC  Final   Culture NO GROWTH < 24 HOURS  Final   Report Status PENDING  Incomplete         Radiology Studies: Dg Orthopantogram  Result Date: 12/10/2015 CLINICAL DATA:  Bacteremia.  Jaw pain. EXAM: ORTHOPANTOGRAM/PANORAMIC COMPARISON:  None. FINDINGS: There is no evidence of fracture or other bony  abnormality. IMPRESSION: No definite abnormality involving the mandible. Electronically Signed   By: Marijo Conception, M.D.   On: 12/10/2015 07:37        Scheduled Meds: .  ceFAZolin (ANCEF) IV  2 g Intravenous Q8H   Continuous Infusions: . sodium chloride 400 mL (12/09/15 1440)     LOS: 6 days     Cordelia Poche [facility] 12/11/2015, 3:42 PM Pager: [phone]  If 7PM-7AM, please contact night-coverage [email or web address] Password [email or web address] 12/11/2015, 3:42 PM

## 2015-12-12 LAB — CBC WITH DIFFERENTIAL/PLATELET
BASOS PCT: 0 %
Basophils Absolute: 0 10*3/uL (ref 0.0–0.1)
EOS ABS: 0 10*3/uL (ref 0.0–0.7)
EOS PCT: 0 %
HCT: 39.1 % (ref 39.0–52.0)
HEMOGLOBIN: 12.9 g/dL — AB (ref 13.0–17.0)
LYMPHS ABS: 1.6 10*3/uL (ref 0.7–4.0)
Lymphocytes Relative: 11 %
MCH: 27.8 pg (ref 26.0–34.0)
MCHC: 33 g/dL (ref 30.0–36.0)
MCV: 84.3 fL (ref 78.0–100.0)
MONOS PCT: 7 %
Monocytes Absolute: 1 10*3/uL (ref 0.1–1.0)
NEUTROS PCT: 82 %
Neutro Abs: 11.7 10*3/uL — ABNORMAL HIGH (ref 1.7–7.7)
PLATELETS: 277 10*3/uL (ref 150–400)
RBC: 4.64 MIL/uL (ref 4.22–5.81)
RDW: 12.9 % (ref 11.5–15.5)
WBC: 14.4 10*3/uL — ABNORMAL HIGH (ref 4.0–10.5)

## 2015-12-12 LAB — BASIC METABOLIC PANEL
Anion gap: 11 (ref 5–15)
BUN: 9 mg/dL (ref 6–20)
CALCIUM: 9.2 mg/dL (ref 8.9–10.3)
CO2: 27 mmol/L (ref 22–32)
CREATININE: 0.64 mg/dL (ref 0.61–1.24)
Chloride: 100 mmol/L — ABNORMAL LOW (ref 101–111)
GFR calc non Af Amer: >60 mL/min (ref >60)
Glucose, Bld: 98 mg/dL (ref 65–99)
Potassium: 4.6 mmol/L (ref 3.5–5.1)
SODIUM: 138 mmol/L (ref 135–145)

## 2015-12-12 MED ORDER — DICLOFENAC SODIUM 75 MG PO TBEC
75.0000 mg | DELAYED_RELEASE_TABLET | Freq: Two times a day (BID) | ORAL | Status: DC | PRN
  Administered 2015-12-12 – 2015-12-13 (×3): 75 mg via ORAL
  Filled 2015-12-12 (×5): qty 1

## 2015-12-12 NOTE — NC FL2 (Signed)
[place] LEVEL OF CARE SCREENING TOOL     IDENTIFICATION  Patient Name: Erik [name] Birthdate: 1995/04/23 Sex: male Admission Date (Current Location): 12/05/2015  [facility] and [place] Number:  Herbalist and Address:  The . [facility], [place] [address] [place], [place] [address]      Provider Number: [id number]  Attending Physician Name and Address:  Mariel Aloe, MD  Relative Name and Phone Number:       Current Level of Care: Hospital Recommended Level of Care: [place] Prior Approval Number:    Date Approved/Denied:   PASRR Number: [id number]  Discharge Plan: SNF    Current Diagnoses: Patient Active Problem List   Diagnosis Date Noted  . 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  . Septic pulmonary embolism ([place]) 12/07/2015  . Hypokalemia 12/07/2015  . Staphylococcus aureus bacteremia 12/06/2015  . Chest pain   . Bacteremia 12/05/2015  . Hyponatremia 12/05/2015  . Thrombocytopenia ([place]) 12/05/2015  . Acute bacterial endocarditis 12/05/2015  . Cocaine abuse   . Lower extremity pain, bilateral 02/22/2015  . Hyperkalemia 02/22/2015  . Acute kidney injury ([place]) 02/22/2015  . Elevated troponin 02/22/2015  . Leukocytosis 02/22/2015  . Acute renal failure due to rhabdomyolysis ([place]) 02/22/2015  . Dyspnea   . Acute endocarditis   . NSTEMI (non-ST elevated myocardial infarction) ([place])   . Traumatic rhabdomyolysis ([place]) 11/10/2014  . Viral hepatitis B chronic ([place]) 11/10/2014  . Left leg pain   . IVDU (intravenous drug user)   . Polysubstance dependence ([place]) 11/16/2013  . Benzodiazepine dependence ([place]) 11/16/2013  . Drug overdose 04/16/2013    Orientation RESPIRATION BLADDER Height & Weight     Self, Time, Situation, Place  Normal Continent Weight: 58.7 kg (129 lb 6.6 oz) Height:  5\' 6"  (167.6 cm)  BEHAVIORAL SYMPTOMS/MOOD NEUROLOGICAL BOWEL NUTRITION STATUS      Continent Diet (Please see DC  Summary)  AMBULATORY STATUS COMMUNICATION OF NEEDS Skin   Independent Verbally Normal                       Personal Care Assistance Level of Assistance  Bathing, Feeding, Dressing Bathing Assistance: Independent Feeding assistance: Independent Dressing Assistance: Independent     Functional Limitations Info             SPECIAL CARE FACTORS FREQUENCY                       Contractures      Additional Factors Info  Code Status, Allergies, Isolation Precautions Code Status Info: Full Allergies Info: Penicillins, Sulfa Antibiotics     Isolation Precautions Info: MRSA     Current Medications (12/12/2015):  This is the current hospital active medication list Current Facility-Administered Medications  Medication Dose Route Frequency Provider Last Rate Last Dose  . 0.9 %  sodium chloride infusion   Intravenous Continuous Eileen Stanford, PA-C 20 mL/hr at 12/12/15 0734    . acetaminophen (TYLENOL) tablet 650 mg  650 mg Oral Q6H PRN Radene Gunning, NP   650 mg at 12/06/15 [id number]   Or  . acetaminophen (TYLENOL) suppository 650 mg  650 mg Rectal Q6H PRN Radene Gunning, NP      . bisacodyl (DULCOLAX) EC tablet 5 mg  5 mg Oral Daily PRN Radene Gunning, NP      . ceFAZolin (ANCEF) IVPB 2g/100 mL premix  2 g Intravenous Q8H Shelda Altes, RPH  2 g at 12/12/15 0509  . HYDROcodone-acetaminophen (NORCO/VICODIN) 5-325 MG per tablet 1 tablet  1 tablet Oral Q6H PRN Orson Eva, MD   1 tablet at 12/12/15 [phone]  . ketorolac (TORADOL) 30 MG/ML injection 30 mg  30 mg Intravenous Q8H PRN Mariel Aloe, MD   30 mg at 12/11/15 1632  . ondansetron (ZOFRAN) tablet 4 mg  4 mg Oral Q6H PRN Radene Gunning, NP       Or  . ondansetron [facility]) injection 4 mg  4 mg Intravenous Q6H PRN Radene Gunning, NP      . traZODone (DESYREL) tablet 25 mg  25 mg Oral QHS PRN Radene Gunning, NP   25 mg at 12/09/15 0011     Discharge Medications: Please see discharge summary for a list of discharge  medications.  Relevant Imaging Results:  Relevant Lab Results:   Additional Information SSn: [id number]      Needs IV antibiotics through PICC (IV drug use hx)-currently on Cefazolin   Benard Halsted, LCSWA

## 2015-12-12 NOTE — Progress Notes (Signed)
Pharmacy Antibiotic Note TORRIEN [name] is a 20 y.o. male admitted on 12/05/2015 with MSSA bacteremia and tricuspid valve endocarditis. Initially started on vancomycin due to PCN allergy but blood cultures remained positive and was transitioned to cefazolin 12/10/15.  Tolerating cefazolin without any issues. Repeat blood cx's from 10/4 are currently negative.    Plan: - Continue cefazolin 2g IV Q8H    Height: 5\' 6"  (167.6 cm) Weight: 129 lb 6.6 oz (58.7 kg) IBW/kg (Calculated) : 63.8  Temp (24hrs), Avg:98 F (36.7 C), Min:97.7 F (36.5 C), Max:98.2 F (36.8 C)   Recent Labs Lab 12/05/15 1600 12/05/15 1844 12/05/15 2336 12/06/15 0226  12/07/15 0757 12/08/15 1230 12/08/15 1234 12/09/15 0444 12/10/15 0457 12/12/15 0635  WBC 13.6*  --   --  9.2  --  12.9*  --  12.7*  --  13.2* 14.4*  CREATININE 0.74  --   --  0.71  --  0.60*  --   --  0.63  --  0.64  LATICACIDVEN  --  3.0* 1.3 1.9  --   --   --   --   --   --   --   VANCOTROUGH  --   --   --   --   < > 6* 14*  --   --  19  --   < > = values in this interval not displayed.  Estimated Creatinine Clearance: 120.3 mL/min (by C-G formula based on SCr of 0.64 mg/dL).    Allergies  Allergen Reactions  . Penicillins Swelling    Per mom- when he was 6 or 7yo his bottom lip swelled  . Sulfa Antibiotics Swelling and Rash    Childhood allergic reaction    Antimicrobials this admission: Vanc 9/29 >>10/4  Cefazolin 10/4>>  Dose adjustments this admission: 10/1 VT = 6 on 750mg  IV q8h: Dose increased to 1000mg  Q6H 10/2 VT = 14 on 1gm q6h: Dose changed to avoid q6h interval  Microbiology results: 9/28 BCx x4: MSSA 10/1 BCx: MSSA 10/3 BCx: BCID- presumptive MSSA 10/4 BCx: ngtd   Thank you for allowing pharmacy to be a part of this patient's care.  Vincenza Hews, PharmD, BCPS 12/12/2015, 8:50 AM Pager: [phone]

## 2015-12-12 NOTE — Progress Notes (Signed)
Subjective: Dislikes the heart healthy diet   Antibiotics:  Anti-infectives    Start     Dose/Rate Route Frequency Ordered Stop   12/10/15 1700  ceFAZolin (ANCEF) IVPB 2g/100 mL premix     2 g 200 mL/hr over 30 Minutes Intravenous Every 8 hours 12/10/15 1642     12/08/15 2200  vancomycin (VANCOCIN) 1,500 mg in sodium chloride 0.9 % 500 mL IVPB  Status:  Discontinued     1,500 mg 250 mL/hr over 120 Minutes Intravenous Every 8 hours 12/08/15 1447 12/10/15 1640   12/07/15 1000  vancomycin (VANCOCIN) 1,750 mg in sodium chloride 0.9 % 500 mL IVPB  Status:  Discontinued     1,750 mg 250 mL/hr over 120 Minutes Intravenous Every 8 hours 12/07/15 0947 12/07/15 0957   12/07/15 1000  vancomycin (VANCOCIN) IVPB 1000 mg/200 mL premix  Status:  Discontinued     1,000 mg 200 mL/hr over 60 Minutes Intravenous Every 6 hours 12/07/15 0958 12/08/15 1447   12/06/15 0100  vancomycin (VANCOCIN) IVPB 750 mg/150 ml premix  Status:  Discontinued     750 mg 150 mL/hr over 60 Minutes Intravenous Every 8 hours 12/05/15 1605 12/07/15 0947   12/05/15 1615  vancomycin (VANCOCIN) IVPB 1000 mg/200 mL premix     1,000 mg 200 mL/hr over 60 Minutes Intravenous  Once 12/05/15 1605 12/05/15 2008      Medications: Scheduled Meds: .  ceFAZolin (ANCEF) IV  2 g Intravenous Q8H   Continuous Infusions: . sodium chloride 20 mL/hr at 12/12/15 0734   PRN Meds:.acetaminophen **OR** acetaminophen, bisacodyl, diclofenac, HYDROcodone-acetaminophen, ondansetron **OR** ondansetron (ZOFRAN) IV, traZODone    Objective: Weight change:   Intake/Output Summary (Last 24 hours) at 12/12/15 2033 Last data filed at 12/12/15 1826  Gross per 24 hour  Intake              460 ml  Output                0 ml  Net              460 ml   Blood pressure 109/71, pulse (!) 101, temperature 98.2 F (36.8 C), temperature source Oral, resp. rate 17, height 5\' 6"  (1.676 m), weight 129 lb 6.6 oz (58.7 kg), SpO2 100 %. Temp:   [97.7 F (36.5 C)-98.2 F (36.8 C)] 98.2 F (36.8 C) (10/06 1428) Pulse Rate:  [63-101] 101 (10/06 1428) Resp:  [17-18] 17 (10/06 1428) BP: (98-109)/(50-71) 109/71 (10/06 1428) SpO2:  [93 %-100 %] 100 % (10/06 1428)  Physical Exam: General: Alert and awake, oriented x3, not in any acute distress. HEENT: anicteric sclera,  Conjunctiva are clear ,EOMI, oropharynx clear and without exudate, Cardiovascular: tachycardic rate, normal r,  no murmur rubs or gallops Pulmonary: clear to auscultation bilaterally, no wheezing, rales or rhonchi, really having full breaths due to pleuritic chest pain Gastrointestinal: soft nontender, nondistended, normal bowel sounds, Musculoskeletal: no  clubbing or edema noted bilaterally,  Skin, soft tissue: no rashes, tattoos present, or Janeway lesions splitter hemorrhages or Osler's nodes Neuro: nonfocal, strength and sensation intact  CBC:  CBC Latest Ref Rng & Units 12/12/2015 12/10/2015 12/08/2015  WBC 4.0 - 10.5 K/uL 14.4(H) 13.2(H) 12.7(H)  Hemoglobin 13.0 - 17.0 g/dL 12.9(L) 11.9(L) 15.1  Hematocrit 39.0 - 52.0 % 39.1 36.3(L) 45.3  Platelets 150 - 400 K/uL 277 239 207     BMET  Recent Labs  12/12/15 0635  NA 138  K  4.6  CL 100*  CO2 27  GLUCOSE 98  BUN 9  CREATININE 0.64  CALCIUM 9.2     Liver Panel  No results for input(s): PROT, ALBUMIN, AST, ALT, ALKPHOS, BILITOT, BILIDIR, IBILI in the last 72 hours.     Sedimentation Rate No results for input(s): ESRSEDRATE in the last 72 hours. C-Reactive Protein No results for input(s): CRP in the last 72 hours.  Micro Results: Recent Results (from the past 720 hour(s))  Culture, blood (routine x 2)     Status: Abnormal   Collection Time: 12/04/15  7:20 PM  Result Value Ref Range Status   Specimen Description BLOOD LEFT ANTECUBITAL  Final   Special Requests BOTTLES DRAWN AEROBIC AND ANAEROBIC 5 CC  Final   Culture  Setup Time   Final    GRAM POSITIVE COCCI IN CLUSTERS IN BOTH AEROBIC  AND ANAEROBIC BOTTLES CRITICAL RESULT CALLED TO, READ BACK BY AND VERIFIED WITH: P CLARK,RN AT [id number] 12/05/15 BY L BENFIELD Performed at [facility] (A)  Final   Report Status 12/07/2015 FINAL  Final   Organism ID, Bacteria STAPHYLOCOCCUS AUREUS  Final      Susceptibility   Staphylococcus aureus - MIC*    CIPROFLOXACIN <=0.5 SENSITIVE Sensitive     ERYTHROMYCIN >=8 RESISTANT Resistant     GENTAMICIN <=0.5 SENSITIVE Sensitive     OXACILLIN <=0.25 SENSITIVE Sensitive     TETRACYCLINE <=1 SENSITIVE Sensitive     VANCOMYCIN 1 SENSITIVE Sensitive     TRIMETH/SULFA <=10 SENSITIVE Sensitive     CLINDAMYCIN <=0.25 SENSITIVE Sensitive     RIFAMPIN <=0.5 SENSITIVE Sensitive     Inducible Clindamycin NEGATIVE Sensitive     * STAPHYLOCOCCUS AUREUS  Blood Culture ID Panel (Reflexed)     Status: Abnormal   Collection Time: 12/04/15  7:20 PM  Result Value Ref Range Status   Enterococcus species NOT DETECTED NOT DETECTED Final   Listeria monocytogenes NOT DETECTED NOT DETECTED Final   Staphylococcus species DETECTED (A) NOT DETECTED Final    Comment: CRITICAL RESULT CALLED TO, READ BACK BY AND VERIFIED WITH: P CLARK,RN AT [id number] 12/05/15 BY L BENFIELD    Staphylococcus aureus DETECTED (A) NOT DETECTED Final    Comment: CRITICAL RESULT CALLED TO, READ BACK BY AND VERIFIED WITH: P CLARK,RN AT [id number] 12/05/15 BY L BENFIELD    Methicillin resistance NOT DETECTED NOT DETECTED Final   Streptococcus species NOT DETECTED NOT DETECTED Final   Streptococcus agalactiae NOT DETECTED NOT DETECTED Final   Streptococcus pneumoniae NOT DETECTED NOT DETECTED Final   Streptococcus pyogenes NOT DETECTED NOT DETECTED Final   Acinetobacter baumannii NOT DETECTED NOT DETECTED Final   Enterobacteriaceae species NOT DETECTED NOT DETECTED Final   Enterobacter cloacae complex NOT DETECTED NOT DETECTED Final   Escherichia coli NOT DETECTED NOT DETECTED Final   Klebsiella oxytoca NOT  DETECTED NOT DETECTED Final   Klebsiella pneumoniae NOT DETECTED NOT DETECTED Final   Proteus species NOT DETECTED NOT DETECTED Final   Serratia marcescens NOT DETECTED NOT DETECTED Final   Haemophilus influenzae NOT DETECTED NOT DETECTED Final   Neisseria meningitidis NOT DETECTED NOT DETECTED Final   Pseudomonas aeruginosa NOT DETECTED NOT DETECTED Final   Candida albicans NOT DETECTED NOT DETECTED Final   Candida glabrata NOT DETECTED NOT DETECTED Final   Candida krusei NOT DETECTED NOT DETECTED Final   Candida parapsilosis NOT DETECTED NOT DETECTED Final   Candida tropicalis NOT DETECTED NOT DETECTED Final  Comment: Performed at [facility]  Culture, blood (routine x 2)     Status: Abnormal   Collection Time: 12/04/15  8:40 PM  Result Value Ref Range Status   Specimen Description BLOOD RIGHT HAND  Final   Special Requests BOTTLES DRAWN AEROBIC AND ANAEROBIC 5CC  Final   Culture  Setup Time   Final    GRAM POSITIVE COCCI IN CLUSTERS IN BOTH AEROBIC AND ANAEROBIC BOTTLES CRITICAL RESULT CALLED TO, READ BACK BY AND VERIFIED WITH: P CLARK,RN AT [id number] 12/05/15 BY L BENFIELD    Culture (A)  Final    STAPHYLOCOCCUS AUREUS SUSCEPTIBILITIES PERFORMED ON PREVIOUS CULTURE WITHIN THE LAST 5 DAYS. Performed at [facility]    Report Status 12/07/2015 FINAL  Final  MRSA PCR Screening     Status: None   Collection Time: 12/05/15  3:38 PM  Result Value Ref Range Status   MRSA by PCR NEGATIVE NEGATIVE Final    Comment:        The GeneXpert MRSA Assay (FDA approved for NASAL specimens only), is one component of a comprehensive MRSA colonization surveillance program. It is not intended to diagnose MRSA infection nor to guide or monitor treatment for MRSA infections.   Culture, blood (Routine X 2) w Reflex to ID Panel     Status: Abnormal   Collection Time: 12/07/15  5:30 PM  Result Value Ref Range Status   Specimen Description BLOOD RIGHT HAND  Final   Special Requests  IN PEDIATRIC BOTTLE 1CC  Final   Culture  Setup Time   Final    GRAM POSITIVE COCCI IN CLUSTERS IN PEDIATRIC BOTTLE CRITICAL VALUE NOTED.  VALUE IS CONSISTENT WITH PREVIOUSLY REPORTED AND CALLED VALUE.    Culture (A)  Final    STAPHYLOCOCCUS AUREUS SUSCEPTIBILITIES PERFORMED ON PREVIOUS CULTURE WITHIN THE LAST 5 DAYS.    Report Status 12/10/2015 FINAL  Final  Culture, blood (Routine X 2) w Reflex to ID Panel     Status: Abnormal   Collection Time: 12/07/15  5:30 PM  Result Value Ref Range Status   Specimen Description BLOOD LEFT HAND  Final   Special Requests IN PEDIATRIC BOTTLE 2CC  Final   Culture  Setup Time   Final    GRAM POSITIVE COCCI IN CLUSTERS IN PEDIATRIC BOTTLE CRITICAL RESULT CALLED TO, READ BACK BY AND VERIFIED WITH: Leonie Green Pharm.D. 11:20 12/08/15 ([email or web address])    Culture STAPHYLOCOCCUS AUREUS (A)  Final   Report Status 12/10/2015 FINAL  Final   Organism ID, Bacteria STAPHYLOCOCCUS AUREUS  Final      Susceptibility   Staphylococcus aureus - MIC*    CIPROFLOXACIN <=0.5 SENSITIVE Sensitive     ERYTHROMYCIN >=8 RESISTANT Resistant     GENTAMICIN <=0.5 SENSITIVE Sensitive     OXACILLIN 0.5 SENSITIVE Sensitive     TETRACYCLINE <=1 SENSITIVE Sensitive     VANCOMYCIN <=0.5 SENSITIVE Sensitive     TRIMETH/SULFA <=10 SENSITIVE Sensitive     CLINDAMYCIN <=0.25 SENSITIVE Sensitive     RIFAMPIN <=0.5 SENSITIVE Sensitive     Inducible Clindamycin NEGATIVE Sensitive     * STAPHYLOCOCCUS AUREUS  Blood Culture ID Panel (Reflexed)     Status: Abnormal   Collection Time: 12/07/15  5:30 PM  Result Value Ref Range Status   Enterococcus species NOT DETECTED NOT DETECTED Final   Listeria monocytogenes NOT DETECTED NOT DETECTED Final   Staphylococcus species DETECTED (A) NOT DETECTED Final    Comment: CRITICAL RESULT CALLED TO, READ BACK BY  AND VERIFIED WITH: Leonie Green Pharm.D. 11:20 12/08/15 ([email or web address])    Staphylococcus aureus DETECTED (A) NOT DETECTED Final    Comment: CRITICAL  RESULT CALLED TO, READ BACK BY AND VERIFIED WITH: M. Radford Pax Pharm.D. 11:20 12/08/15 ([email or web address])    Methicillin resistance NOT DETECTED NOT DETECTED Final   Streptococcus species NOT DETECTED NOT DETECTED Final   Streptococcus agalactiae NOT DETECTED NOT DETECTED Final   Streptococcus pneumoniae NOT DETECTED NOT DETECTED Final   Streptococcus pyogenes NOT DETECTED NOT DETECTED Final   Acinetobacter baumannii NOT DETECTED NOT DETECTED Final   Enterobacteriaceae species NOT DETECTED NOT DETECTED Final   Enterobacter cloacae complex NOT DETECTED NOT DETECTED Final   Escherichia coli NOT DETECTED NOT DETECTED Final   Klebsiella oxytoca NOT DETECTED NOT DETECTED Final   Klebsiella pneumoniae NOT DETECTED NOT DETECTED Final   Proteus species NOT DETECTED NOT DETECTED Final   Serratia marcescens NOT DETECTED NOT DETECTED Final   Haemophilus influenzae NOT DETECTED NOT DETECTED Final   Neisseria meningitidis NOT DETECTED NOT DETECTED Final   Pseudomonas aeruginosa NOT DETECTED NOT DETECTED Final   Candida albicans NOT DETECTED NOT DETECTED Final   Candida glabrata NOT DETECTED NOT DETECTED Final   Candida krusei NOT DETECTED NOT DETECTED Final   Candida parapsilosis NOT DETECTED NOT DETECTED Final   Candida tropicalis NOT DETECTED NOT DETECTED Final  Culture, blood (Routine X 2) w Reflex to ID Panel     Status: Abnormal (Preliminary result)   Collection Time: 12/09/15  9:40 AM  Result Value Ref Range Status   Specimen Description BLOOD LEFT HAND  Final   Special Requests IN PEDIATRIC BOTTLE 4CC  Final   Culture  Setup Time   Final    GRAM POSITIVE COCCI IN CLUSTERS AEROBIC BOTTLE ONLY CRITICAL RESULT CALLED TO, READ BACK BY AND VERIFIED WITH: M TURNER 12/10/15 @ 1027 M VESTAL    Culture STAPHYLOCOCCUS AUREUS (A)  Final   Report Status PENDING  Incomplete   Organism ID, Bacteria STAPHYLOCOCCUS AUREUS  Final      Susceptibility   Staphylococcus aureus - MIC*    CIPROFLOXACIN <=0.5 SENSITIVE  Sensitive     ERYTHROMYCIN >=8 RESISTANT Resistant     GENTAMICIN <=0.5 SENSITIVE Sensitive     OXACILLIN 0.5 SENSITIVE Sensitive     TETRACYCLINE <=1 SENSITIVE Sensitive     VANCOMYCIN 1 SENSITIVE Sensitive     TRIMETH/SULFA <=10 SENSITIVE Sensitive     CLINDAMYCIN <=0.25 SENSITIVE Sensitive     RIFAMPIN <=0.5 SENSITIVE Sensitive     Inducible Clindamycin NEGATIVE Sensitive     * STAPHYLOCOCCUS AUREUS  Culture, blood (Routine X 2) w Reflex to ID Panel     Status: None (Preliminary result)   Collection Time: 12/09/15  9:55 AM  Result Value Ref Range Status   Specimen Description BLOOD RIGHT HAND  Final   Special Requests IN PEDIATRIC BOTTLE 1CC  Final   Culture NO GROWTH 3 DAYS  Final   Report Status PENDING  Incomplete  Culture, blood (routine x 2)     Status: None (Preliminary result)   Collection Time: 12/10/15  4:17 PM  Result Value Ref Range Status   Specimen Description BLOOD RIGHT HAND  Final   Special Requests BOTTLES DRAWN AEROBIC ONLY 5CC  Final   Culture NO GROWTH 2 DAYS  Final   Report Status PENDING  Incomplete  Culture, blood (routine x 2)     Status: None (Preliminary result)   Collection Time: 12/10/15  4:22 PM  Result  Value Ref Range Status   Specimen Description BLOOD RIGHT ARM  Final   Special Requests BOTTLES DRAWN AEROBIC ONLY 5CC  Final   Culture NO GROWTH 2 DAYS  Final   Report Status PENDING  Incomplete    Studies/Results: No results found.    Assessment/Plan:  INTERVAL HISTORY: TEE with LARGE vegetation on TWO leaflets of TV  SEEN BY CVTS AND CARDIOLOGY FORMALLY  PTS MOM STATES HIS TOP LIP SWELLED ON PCN AS A CHILD, PATIENT WILLING TO TRIAL OF CEPHALOSPORIN  He is tolerating the CEPH   Principal Problem:   Bacteremia Active Problems:   Benzodiazepine dependence (HCC)   IVDU (intravenous drug user)   Viral hepatitis B chronic (HCC)   Leukocytosis   Hyponatremia   Thrombocytopenia (HCC)   Chest pain   Staphylococcus aureus  bacteremia   Septic pulmonary embolism (HCC)   Hypokalemia   Acute bacterial endocarditis   Tricuspid regurgitation    Erik [name] is a 20 y.o. male with 20 y.o. 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 MSSA right-sided endocarditis:                                             [place] [facility]icrobial Management Team Staphylococcus aureus bacteremia  Staphylococcus aureus bacteremia (SAB) is associated with a high rate of complications and mortality.  Specific aspects of clinical management are critical to optimizing the outcome of patients with SAB.  Therefore, the [facility] Antimicrobial Management Team [facility]) has initiated an intervention aimed at improving the management of SAB at [facility].  To do so, Infectious Diseases physicians are providing an evidence-based consult for the management of all patients with SAB.     Yes No Comments  Perform follow-up blood cultures (even if the patient is afebrile) to ensure clearance of bacteremia [x]   BLOOD CULTURES have been  PERSISTENTLY POSITIVE  Ones from 12/10/15 are NGTD   Remove vascular catheter and obtain follow-up blood cultures after the removal of the catheter []  []  DO NOT PLACE PICC LINE OR CENTRAL LINE UNTIL CULTURES FROM TODAY ARE NO GROWTH X 4-5 DAYS  Perform echocardiography to evaluate for endocarditis (transthoracic ECHO is 40-50% sensitive, TEE is > 90% sensitive) []  []  TEE with large vegetation on 2 leaflets of TV       Ensure source control []  []  Have all abscesses been drained effectively? Have deep seeded infections (septic joints or osteomyelitis) had appropriate surgical debridement? Source seems to be his injection drug use   Investigate for "metastatic" sites of infection []  []  Does the patient have ANY symptom or physical exam finding that would suggest a deeper infection (back or neck pain that may be  suggestive of vertebral osteomyelitis or epidural abscess, muscle pain that could be a symptom of pyomyositis)?  Keep in mind that for deep seeded infections MRI imaging with contrast is preferred rather than other often insensitive tests such as plain x-rays, especially early in a patient's presentation.   No clinical signs pointing to and others metastatic site of infection besides the lungs at this point   Trial of ANCEF 2G IV Q 8 HOURS [x]  []  Beta-lactam antibiotics are preferred for MSSA due to higher cure rates.   If on Vancomycin, goal trough should be 15 -  20 mcg/mL  Estimated duration of IV antibiotic therapy:  6 weeks but would need to be in a skilled nursing facility  [x]  []  Consult case management for probably prolonged outpatient IV antibiotic therapy    #2 Chronic hepatitis be without hepatic coma: I would are you that there is a good reason to consider treating him with anti-hepatitis B therapy in particular if there is any suspicion of shared needles as he could spread hep B in the community if he shares his needles with fellow IV drug users. He denied using shared needles but it is something to consider. I don't think he needs screening for hepatocellular carcinoma at this young age  #3 Intravenous drug use: Clearly he needs to being gauged in a opiate replacement plan to treat his injection drug use as are hisa for a to administer intravenous drugs and to avoid further episodes of infectious endocarditis from injection drug use. He says he did not like methadone at all suboxone was "ok"    Dr. Johnnye Sima available for questions over the weekend.  #4 cracked tooth and problems with dentition: Consider Panorex and he will need to be plugged into the dental as an outpatient. Proper dental hygiene is clearly also critical given his now documented history of endocarditis he will also need antecedent antibiotics prior to invasive dental procedures.   LOS: 7 days   Alcide Evener 12/12/2015, 8:33 PM

## 2015-12-12 NOTE — Progress Notes (Signed)
TELEMETRY: N/A Vitals:   12/11/15 0607 12/11/15 1413 12/11/15 2127 12/12/15 0442  BP: 114/65 (!) 123/59 (!) 98/50 (!) 98/54  Pulse: 78 82 63 67  Resp: 18 18 18 18   Temp: 98.4 F (36.9 C) 98.2 F (36.8 C) 97.7 F (36.5 C) 98.2 F (36.8 C)  TempSrc: Oral Oral Oral Oral  SpO2: 97% 99% 93% 97%  Weight:      Height:       No intake or output data in the 24 hours ending 12/12/15 0949 Filed Weights   12/05/15 1540  Weight: 129 lb 6.6 oz (58.7 kg)    Subjective  Still reports some left sided chest pain, but is no worse today. Resting comfortably in the bed.  Marland Kitchen  ceFAZolin (ANCEF) IV  2 g Intravenous Q8H   . sodium chloride 20 mL/hr at 12/12/15 0734    LABS: Basic Metabolic Panel:  Recent Labs  12/12/15 0635  NA 138  K 4.6  CL 100*  CO2 27  GLUCOSE 98  BUN 9  CREATININE 0.64  CALCIUM 9.2   Liver Function Tests: No results for input(s): AST, ALT, ALKPHOS, BILITOT, PROT, ALBUMIN in the last 72 hours. No results for input(s): LIPASE, AMYLASE in the last 72 hours. CBC:  Recent Labs  12/10/15 0457 12/12/15 0635  WBC 13.2* 14.4*  NEUTROABS  --  11.7*  HGB 11.9* 12.9*  HCT 36.3* 39.1  MCV 84.0 84.3  PLT 239 277   Cardiac Enzymes: No results for input(s): CKTOTAL, CKMB, CKMBINDEX, TROPONINI in the last 72 hours. BNP: No results for input(s): PROBNP in the last 72 hours. D-Dimer: No results for input(s): DDIMER in the last 72 hours. Hemoglobin A1C: No results for input(s): HGBA1C in the last 72 hours. Fasting Lipid Panel: No results for input(s): CHOL, HDL, LDLCALC, TRIG, CHOLHDL, LDLDIRECT in the last 72 hours. Thyroid Function Tests: No results for input(s): TSH, T4TOTAL, T3FREE, THYROIDAB in the last 72 hours.  Invalid input(s): FREET3   Radiology/Studies:  No results found.  PHYSICAL EXAM General: Well developed, well nourished, in no acute distress. Head: Normal Neck: Negative for carotid bruits. JVD not elevated. No adenopathy Lungs: Clear  bilaterally to auscultation without wheezes, rales, or rhonchi. Breathing is unlabored. Heart: RRR S1 S2 with gr 2/6 systolic murmur LSB Abdomen: Soft, non-tender, non-distended with normoactive bowel sounds. No hepatomegaly. No rebound/guarding. No obvious abdominal masses. Msk:  Strength and tone appears normal for age. Extremities: No clubbing, cyanosis or edema.  Distal pedal pulses are 2+ and equal bilaterally. Left ankle without swelling, nodules, or rash. Neuro: Alert and oriented X 3. Moves all extremities spontaneously. Psych:  Responds to questions appropriately with a normal affect.  ASSESSMENT AND PLAN: 1. Tricuspid valve endocarditis in setting of methicillin sensitive Staphylococcus aureus bacteremia - He continues to use IV drug abuse. TTE done on 12/07/2015 showed left ventricular function of 50-55%, no wall motion abnormality with vegetation on tricuspid valve with diameter of 2 cm, moderate TR with PASP estimated 25 mmHg. CT angiogram of chest showed septic emboli and pneumonia. TEE  showed large vegetation involving 2 of the leaflet of tricuspid valve with moderate TR. Normal LV function. No thrombus or PFO. CT surgery consulted. No indication for surgery at this point. BC from 10/4 negative. Continue IV antibiotics per ID changed from vanco to cefazolin 10/4. Repeat echo 10/8.   2. Septic Embolic - Due to endocarditis  3. IVDU - Strongly encouraged discontinuation.   Present on Admission: . Benzodiazepine  dependence ([place]) . Bacteremia . Hyponatremia . Thrombocytopenia ([place]) . IVDU (intravenous drug user) . Leukocytosis . Viral hepatitis B chronic ([place]) . Chest pain . Staphylococcus aureus bacteremia . Septic pulmonary embolism ([place]) . Acute bacterial endocarditis . Tricuspid regurgitation   Signed, Reino Bellis, NP -C 12/12/2015 9:49 AM Patient seen and examined and history reviewed. Agree with above findings and plan. Patient is stable from a cardiac  standpoint. We will plan follow up Echo on Monday. Otherwise call for any questions.  Satoya Feeley [place], [place] 12/12/2015 10:34 AM

## 2015-12-12 NOTE — Progress Notes (Signed)
PROGRESS NOTE    Erik [name]  [id number] DOB: [birth year] DOA: 12/05/2015 PCP: Gwendolyn Grant, MD   Brief Narrative: Erik [name] is a 20 y.o. male with a history of intravenous drug use, anxiety, endocarditis, chronic hepatitis B presented after being informed of positive blood cultures that was obtained on 12/04/2015. Preliminary results show Staphylococcus aureus in 2 out of 2 sets. The patient initially presented to the emergency department on 12/04/2015 with chest pain and shortness of breath. The patient was given a dose of levofloxacin and discharged from the emergency department after blood cultures were obtained. The patient states that he last used heroin approximately 2 weeks prior to this admission. during his admission from 02/22/2015 through 02/26/2015,, the patient was admitted for treatment of rhabdomyolysis, cellulitisof bilateral feet, and elevated troponins. The patient also has had a previous admission back in September 2016 for narcotic induced respiratory failure requiring intubation. In addition, the patient has had a number of previous admissions in the past for narcotic and other illicit drug overdose.The patient was started on intravenous vancomycin and admitted for further evaluation of his bacteremia.   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 Blood cultures still growing bacteria. Large vegetation seen on 2 tricuspid valve leaflets on TEE. Cardiothoracic surgery evaluated sided no surgery at this time. Cultures from 10/5 so far no growth -  continue cefazolin per infectious disease recommendations  Pulmonary opacities Septic emboli Secondary to MSSA bacteremia. Patient is symptomatic with pleuritic chest pain.  Hyponatremia Resolved  Thrombocytopenia Resolved  Chronic hepatitis B infection without, Pierce considered. No acute symptoms. Hepatitis C negative -Follow-up outpatient  Poor dentition Orthopantogram shows no fractures. -Follow-up with dentist outpatient  Chest pain Patient has a recent heart catheterization significant for normal coronary arteries. Troponin was borderline initially and has decreased. Likely secondary to pleuritic chest pain from embolic septic disease. -Continue Norco for pain -start diclofenac prn  DVT prophylaxis: SCDs  Code Status: Full code  Family Communication: Father at bedside  Disposition Plan: Likely discharge to SNF to continue IV antibiotics once blood cultures were negative for 5 days per ID recommendations   Consultants:   Infectious disease  Cardiology  Cardiothoracic surgery  Procedures:   TEE (12/09/2015)  TTE (12/07/2015)  Antimicrobials:  Vancomycin (9/29>>10/4)  Cefazolin (10/4>>   Subjective: Patient still has chest pain. No other concerns  Objective: Vitals:   12/11/15 0607 12/11/15 1413 12/11/15 2127 12/12/15 0442  BP: 114/65 (!) 123/59 (!) 98/50 (!) 98/54  Pulse: 78 82 63 67  Resp: 18 18 18 18   Temp: 98.4 F (36.9 C) 98.2 F (36.8 C) 97.7 F (36.5 C) 98.2 F (36.8 C)  TempSrc: Oral Oral Oral Oral  SpO2: 97% 99% 93% 97%  Weight:      Height:        Intake/Output Summary (Last 24 hours) at 12/12/15 1348 Last data filed at 12/12/15 1100  Gross per 24 hour  Intake              240 ml  Output  0 ml  Net              240 ml   Filed Weights   12/05/15 1540  Weight: 58.7 kg (129 lb 6.6 oz)    Examination:  General exam: Appears calm and comfortable. Respiratory system: Clear to auscultation. Respiratory effort  normal. Cardiovascular system: S1 & S2 heard, RRR. 1/6 systolic murmur Gastrointestinal system: Abdomen is nondistended, soft and nontender. No organomegaly or masses felt. Normal bowel sounds heard. Central nervous system: Alert and oriented. No focal neurological deficits. Extremities: No edema. No calf tenderness Skin: No cyanosis. No rashes. No petechiae Psychiatry: Judgement and insight appear normal. Mood & affect appropriate.     Data Reviewed: I have personally reviewed following labs and imaging studies  CBC:  Recent Labs Lab 12/05/15 1600 12/06/15 0226 12/07/15 0757 12/08/15 1234 12/10/15 0457 12/12/15 0635  WBC 13.6* 9.2 12.9* 12.7* 13.2* 14.4*  NEUTROABS 10.6*  --   --   --   --  11.7*  HGB 13.3 14.0 12.6* 15.1 11.9* 12.9*  HCT 38.4* 40.9 37.6* 45.3 36.3* 39.1  MCV 80.5 82.5 81.6 83.1 84.0 84.3  PLT 92* 86* 158 207 239 [id number]   Basic Metabolic Panel:  Recent Labs Lab 12/05/15 1600 12/06/15 0226 12/07/15 0757 12/08/15 1836 12/09/15 0444 12/12/15 0635  NA 129* 131* 136  --  136 138  K 3.7 3.9 3.3*  --  4.2 4.6  CL 92* 100* 103  --  107 100*  CO2 25 23 24   --  22 27  GLUCOSE 171* 111* 109*  --  89 98  BUN 11 12 8   --  7 9  CREATININE 0.74 0.71 0.60*  --  0.63 0.64  CALCIUM 8.6* 8.3* 8.2*  --  8.5* 9.2  MG  --   --   --  2.1 2.0  --    GFR: Estimated Creatinine Clearance: 120.3 mL/min (by C-G formula based on SCr of 0.64 mg/dL). Liver Function Tests:  Recent Labs Lab 12/05/15 1600  AST 26  ALT 23  ALKPHOS 79  BILITOT 0.5  PROT 6.8  ALBUMIN 2.8*   No results for input(s): LIPASE, AMYLASE in the last 168 hours. No results for input(s): AMMONIA in the last 168 hours. Coagulation Profile: No results for input(s): INR, PROTIME in the last 168 hours. Cardiac Enzymes:  Recent Labs Lab 12/05/15 1828 12/05/15 2336  TROPONINI 0.03* <0.03   BNP (last 3 results) No results for input(s): PROBNP in the last 8760 hours. HbA1C: No results for  input(s): HGBA1C in the last 72 hours. CBG: No results for input(s): GLUCAP in the last 168 hours. Lipid Profile: No results for input(s): CHOL, HDL, LDLCALC, TRIG, CHOLHDL, LDLDIRECT in the last 72 hours. Thyroid Function Tests: No results for input(s): TSH, T4TOTAL, FREET4, T3FREE, THYROIDAB in the last 72 hours. Anemia Panel: No results for input(s): VITAMINB12, FOLATE, FERRITIN, TIBC, IRON, RETICCTPCT in the last 72 hours. Sepsis Labs:  Recent Labs Lab 12/05/15 1844 12/05/15 2336 12/06/15 0226  LATICACIDVEN 3.0* 1.3 1.9    Recent Results (from the past 240 hour(s))  Culture, blood (routine x 2)     Status: Abnormal   Collection Time: 12/04/15  7:20 PM  Result Value Ref Range Status   Specimen Description BLOOD LEFT ANTECUBITAL  Final   Special Requests BOTTLES DRAWN AEROBIC AND ANAEROBIC 5 CC  Final   Culture  Setup Time   Final    GRAM POSITIVE COCCI IN CLUSTERS IN BOTH AEROBIC  AND ANAEROBIC BOTTLES CRITICAL RESULT CALLED TO, READ BACK BY AND VERIFIED WITH: P CLARK,RN AT [id number] 12/05/15 BY L BENFIELD Performed at [facility] (A)  Final   Report Status 12/07/2015 FINAL  Final   Organism ID, Bacteria STAPHYLOCOCCUS AUREUS  Final      Susceptibility   Staphylococcus aureus - MIC*    CIPROFLOXACIN <=0.5 SENSITIVE Sensitive     ERYTHROMYCIN >=8 RESISTANT Resistant     GENTAMICIN <=0.5 SENSITIVE Sensitive     OXACILLIN <=0.25 SENSITIVE Sensitive     TETRACYCLINE <=1 SENSITIVE Sensitive     VANCOMYCIN 1 SENSITIVE Sensitive     TRIMETH/SULFA <=10 SENSITIVE Sensitive     CLINDAMYCIN <=0.25 SENSITIVE Sensitive     RIFAMPIN <=0.5 SENSITIVE Sensitive     Inducible Clindamycin NEGATIVE Sensitive     * STAPHYLOCOCCUS AUREUS  Blood Culture ID Panel (Reflexed)     Status: Abnormal   Collection Time: 12/04/15  7:20 PM  Result Value Ref Range Status   Enterococcus species NOT DETECTED NOT DETECTED Final   Listeria monocytogenes NOT DETECTED  NOT DETECTED Final   Staphylococcus species DETECTED (A) NOT DETECTED Final    Comment: CRITICAL RESULT CALLED TO, READ BACK BY AND VERIFIED WITH: P CLARK,RN AT [id number] 12/05/15 BY L BENFIELD    Staphylococcus aureus DETECTED (A) NOT DETECTED Final    Comment: CRITICAL RESULT CALLED TO, READ BACK BY AND VERIFIED WITH: P CLARK,RN AT [id number] 12/05/15 BY L BENFIELD    Methicillin resistance NOT DETECTED NOT DETECTED Final   Streptococcus species NOT DETECTED NOT DETECTED Final   Streptococcus agalactiae NOT DETECTED NOT DETECTED Final   Streptococcus pneumoniae NOT DETECTED NOT DETECTED Final   Streptococcus pyogenes NOT DETECTED NOT DETECTED Final   Acinetobacter baumannii NOT DETECTED NOT DETECTED Final   Enterobacteriaceae species NOT DETECTED NOT DETECTED Final   Enterobacter cloacae complex NOT DETECTED NOT DETECTED Final   Escherichia coli NOT DETECTED NOT DETECTED Final   Klebsiella oxytoca NOT DETECTED NOT DETECTED Final   Klebsiella pneumoniae NOT DETECTED NOT DETECTED Final   Proteus species NOT DETECTED NOT DETECTED Final   Serratia marcescens NOT DETECTED NOT DETECTED Final   Haemophilus influenzae NOT DETECTED NOT DETECTED Final   Neisseria meningitidis NOT DETECTED NOT DETECTED Final   Pseudomonas aeruginosa NOT DETECTED NOT DETECTED Final   Candida albicans NOT DETECTED NOT DETECTED Final   Candida glabrata NOT DETECTED NOT DETECTED Final   Candida krusei NOT DETECTED NOT DETECTED Final   Candida parapsilosis NOT DETECTED NOT DETECTED Final   Candida tropicalis NOT DETECTED NOT DETECTED Final    Comment: Performed at [facility]  Culture, blood (routine x 2)     Status: Abnormal   Collection Time: 12/04/15  8:40 PM  Result Value Ref Range Status   Specimen Description BLOOD RIGHT HAND  Final   Special Requests BOTTLES DRAWN AEROBIC AND ANAEROBIC 5CC  Final   Culture  Setup Time   Final    GRAM POSITIVE COCCI IN CLUSTERS IN BOTH AEROBIC AND ANAEROBIC BOTTLES CRITICAL  RESULT CALLED TO, READ BACK BY AND VERIFIED WITH: P CLARK,RN AT [id number] 12/05/15 BY L BENFIELD    Culture (A)  Final    STAPHYLOCOCCUS AUREUS SUSCEPTIBILITIES PERFORMED ON PREVIOUS CULTURE WITHIN THE LAST 5 DAYS. Performed at [facility]    Report Status 12/07/2015 FINAL  Final  MRSA PCR Screening     Status: None   Collection Time: 12/05/15  3:38  PM  Result Value Ref Range Status   MRSA by PCR NEGATIVE NEGATIVE Final    Comment:        The GeneXpert MRSA Assay (FDA approved for NASAL specimens only), is one component of a comprehensive MRSA colonization surveillance program. It is not intended to diagnose MRSA infection nor to guide or monitor treatment for MRSA infections.   Culture, blood (Routine X 2) w Reflex to ID Panel     Status: Abnormal   Collection Time: 12/07/15  5:30 PM  Result Value Ref Range Status   Specimen Description BLOOD RIGHT HAND  Final   Special Requests IN PEDIATRIC BOTTLE 1CC  Final   Culture  Setup Time   Final    GRAM POSITIVE COCCI IN CLUSTERS IN PEDIATRIC BOTTLE CRITICAL VALUE NOTED.  VALUE IS CONSISTENT WITH PREVIOUSLY REPORTED AND CALLED VALUE.    Culture (A)  Final    STAPHYLOCOCCUS AUREUS SUSCEPTIBILITIES PERFORMED ON PREVIOUS CULTURE WITHIN THE LAST 5 DAYS.    Report Status 12/10/2015 FINAL  Final  Culture, blood (Routine X 2) w Reflex to ID Panel     Status: Abnormal   Collection Time: 12/07/15  5:30 PM  Result Value Ref Range Status   Specimen Description BLOOD LEFT HAND  Final   Special Requests IN PEDIATRIC BOTTLE 2CC  Final   Culture  Setup Time   Final    GRAM POSITIVE COCCI IN CLUSTERS IN PEDIATRIC BOTTLE CRITICAL RESULT CALLED TO, READ BACK BY AND VERIFIED WITH: Leonie Green Pharm.D. 11:20 12/08/15 ([email or web address])    Culture STAPHYLOCOCCUS AUREUS (A)  Final   Report Status 12/10/2015 FINAL  Final   Organism ID, Bacteria STAPHYLOCOCCUS AUREUS  Final      Susceptibility   Staphylococcus aureus - MIC*    CIPROFLOXACIN <=0.5  SENSITIVE Sensitive     ERYTHROMYCIN >=8 RESISTANT Resistant     GENTAMICIN <=0.5 SENSITIVE Sensitive     OXACILLIN 0.5 SENSITIVE Sensitive     TETRACYCLINE <=1 SENSITIVE Sensitive     VANCOMYCIN <=0.5 SENSITIVE Sensitive     TRIMETH/SULFA <=10 SENSITIVE Sensitive     CLINDAMYCIN <=0.25 SENSITIVE Sensitive     RIFAMPIN <=0.5 SENSITIVE Sensitive     Inducible Clindamycin NEGATIVE Sensitive     * STAPHYLOCOCCUS AUREUS  Blood Culture ID Panel (Reflexed)     Status: Abnormal   Collection Time: 12/07/15  5:30 PM  Result Value Ref Range Status   Enterococcus species NOT DETECTED NOT DETECTED Final   Listeria monocytogenes NOT DETECTED NOT DETECTED Final   Staphylococcus species DETECTED (A) NOT DETECTED Final    Comment: CRITICAL RESULT CALLED TO, READ BACK BY AND VERIFIED WITH: MRadford Pax Pharm.D. 11:20 12/08/15 ([email or web address])    Staphylococcus aureus DETECTED (A) NOT DETECTED Final    Comment: CRITICAL RESULT CALLED TO, READ BACK BY AND VERIFIED WITH: M. Radford Pax Pharm.D. 11:20 12/08/15 ([email or web address])    Methicillin resistance NOT DETECTED NOT DETECTED Final   Streptococcus species NOT DETECTED NOT DETECTED Final   Streptococcus agalactiae NOT DETECTED NOT DETECTED Final   Streptococcus pneumoniae NOT DETECTED NOT DETECTED Final   Streptococcus pyogenes NOT DETECTED NOT DETECTED Final   Acinetobacter baumannii NOT DETECTED NOT DETECTED Final   Enterobacteriaceae species NOT DETECTED NOT DETECTED Final   Enterobacter cloacae complex NOT DETECTED NOT DETECTED Final   Escherichia coli NOT DETECTED NOT DETECTED Final   Klebsiella oxytoca NOT DETECTED NOT DETECTED Final   Klebsiella pneumoniae NOT DETECTED NOT DETECTED Final   Proteus species NOT DETECTED NOT  DETECTED Final   Serratia marcescens NOT DETECTED NOT DETECTED Final   Haemophilus influenzae NOT DETECTED NOT DETECTED Final   Neisseria meningitidis NOT DETECTED NOT DETECTED Final   Pseudomonas aeruginosa NOT DETECTED NOT DETECTED Final    Candida albicans NOT DETECTED NOT DETECTED Final   Candida glabrata NOT DETECTED NOT DETECTED Final   Candida krusei NOT DETECTED NOT DETECTED Final   Candida parapsilosis NOT DETECTED NOT DETECTED Final   Candida tropicalis NOT DETECTED NOT DETECTED Final  Culture, blood (Routine X 2) w Reflex to ID Panel     Status: Abnormal (Preliminary result)   Collection Time: 12/09/15  9:40 AM  Result Value Ref Range Status   Specimen Description BLOOD LEFT HAND  Final   Special Requests IN PEDIATRIC BOTTLE 4CC  Final   Culture  Setup Time   Final    GRAM POSITIVE COCCI IN CLUSTERS AEROBIC BOTTLE ONLY CRITICAL RESULT CALLED TO, READ BACK BY AND VERIFIED WITH: M TURNER 12/10/15 @ 23 M VESTAL    Culture (A)  Final    STAPHYLOCOCCUS AUREUS SUSCEPTIBILITIES TO FOLLOW    Report Status PENDING  Incomplete  Culture, blood (Routine X 2) w Reflex to ID Panel     Status: None (Preliminary result)   Collection Time: 12/09/15  9:55 AM  Result Value Ref Range Status   Specimen Description BLOOD RIGHT HAND  Final   Special Requests IN PEDIATRIC BOTTLE 1CC  Final   Culture NO GROWTH 2 DAYS  Final   Report Status PENDING  Incomplete  Culture, blood (routine x 2)     Status: None (Preliminary result)   Collection Time: 12/10/15  4:17 PM  Result Value Ref Range Status   Specimen Description BLOOD RIGHT HAND  Final   Special Requests BOTTLES DRAWN AEROBIC ONLY 5CC  Final   Culture NO GROWTH < 24 HOURS  Final   Report Status PENDING  Incomplete  Culture, blood (routine x 2)     Status: None (Preliminary result)   Collection Time: 12/10/15  4:22 PM  Result Value Ref Range Status   Specimen Description BLOOD RIGHT ARM  Final   Special Requests BOTTLES DRAWN AEROBIC ONLY 5CC  Final   Culture NO GROWTH < 24 HOURS  Final   Report Status PENDING  Incomplete         Radiology Studies: No results found.      Scheduled Meds: .  ceFAZolin (ANCEF) IV  2 g Intravenous Q8H   Continuous Infusions: .  sodium chloride 20 mL/hr at 12/12/15 0734     LOS: 7 days     Cordelia Poche [facility] 12/12/2015, 1:48 PM Pager: [phone]  If 7PM-7AM, please contact night-coverage [email or web address] Password [email or web address] 12/12/2015, 1:48 PM

## 2015-12-13 LAB — CULTURE, BLOOD (ROUTINE X 2)

## 2015-12-13 MED ORDER — ENSURE ENLIVE PO LIQD: 237.0000 mL | ORAL | Status: DC

## 2015-12-13 NOTE — Progress Notes (Addendum)
PROGRESS NOTE    Erik [name]  [id number] DOB: 09/28/1995 DOA: 12/05/2015 PCP: Gwendolyn Grant, MD   Brief Narrative: Erik [name] is a 20 y.o. male with a history of intravenous drug use, anxiety, endocarditis, chronic hepatitis B presented after being informed of positive blood cultures that was obtained on 12/04/2015. Preliminary results show Staphylococcus aureus in 2 out of 2 sets. The patient initially presented to the emergency department on 12/04/2015 with chest pain and shortness of breath. The patient was given a dose of levofloxacin and discharged from the emergency department after blood cultures were obtained. The patient states that he last used heroin approximately 2 weeks prior to this admission. during his admission from 02/22/2015 through 02/26/2015,, the patient was admitted for treatment of rhabdomyolysis, cellulitisof bilateral feet, and elevated troponins. The patient also has had a previous admission back in September 2016 for narcotic induced respiratory failure requiring intubation. In addition, the patient has had a number of previous admissions in the past for narcotic and other illicit drug overdose.The patient was started on intravenous vancomycin and admitted for further evaluation of his bacteremia.   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 Blood cultures still growing bacteria. Large vegetation seen on 2 tricuspid valve leaflets on TEE. Cardiothoracic surgery evaluated sided no surgery at this time. Cultures from 10/5 so far no growth -  continue cefazolin per infectious disease recommendations -repeat TTE scheduled for tomorrow (Addendum: erroneously stated TEE rather than TTE)  Pulmonary opacities Septic emboli Secondary to MSSA bacteremia. Patient is symptomatic with pleuritic chest pain.  Hyponatremia Resolved  Thrombocytopenia Resolved  Chronic hepatitis B infection without, Pierce considered. No acute symptoms. Hepatitis C negative -Follow-up outpatient  Poor dentition Orthopantogram shows no fractures. -Follow-up with dentist outpatient  Chest pain Patient has a recent heart catheterization significant for normal coronary arteries. Troponin was borderline initially and has decreased. Likely secondary to pleuritic chest pain from embolic septic disease. -Continue Norco for pain -start diclofenac prn  DVT prophylaxis: SCDs  Code Status: Full code  Family Communication: Father at bedside  Disposition Plan: Discharge to SNF to continue IV antibiotics once blood cultures were negative for 5 days per ID recommendations   Consultants:   Infectious disease  Cardiology  Cardiothoracic surgery  Procedures:   TEE (12/09/2015)  TTE (12/07/2015)  Antimicrobials:  Vancomycin (9/29>>10/4)  Cefazolin (10/4>>   Subjective: Chest pain manageable. No other concerns  Objective: Vitals:   12/12/15 0442 12/12/15 1428 12/12/15 2114 12/13/15 0501  BP: (!) 98/54 109/71 (!) 95/57 (!) 98/58  Pulse: 67 (!) 101 82 78  Resp: 18 17 18 18   Temp: 98.2 F (36.8 C) 98.2 F (36.8 C) 97.4 F (36.3 C) 98.2 F (36.8 C)  TempSrc: Oral Oral Oral Oral  SpO2: 97% 100% 99% 92%  Weight:      Height:        Intake/Output Summary (Last 24 hours) at 12/13/15 1356 Last data filed at 12/12/15 1826  Gross per 24 hour  Intake              220 ml  Output                0 ml  Net              220 ml   Filed Weights   12/05/15 1540  Weight: 58.7 kg (129 lb 6.6 oz)    Examination:  General exam: Appears calm and  comfortable. Sitting in chair Respiratory system: Clear to auscultation. Respiratory effort normal. Cardiovascular system: S1 & S2 heard, RRR. 1/6 systolic murmur Gastrointestinal system: Abdomen is nondistended, soft and nontender. No organomegaly or masses felt. Normal bowel sounds heard. Central nervous system: Alert and oriented. No focal neurological deficits. Extremities: No edema. No calf tenderness Skin: No cyanosis. No rashes. No petechiae Psychiatry: Judgement and insight appear normal. Mood & affect appropriate.     Data Reviewed: I have personally reviewed following labs and imaging studies  CBC:  Recent Labs Lab 12/07/15 0757 12/08/15 1234 12/10/15 0457 12/12/15 0635  WBC 12.9* 12.7* 13.2* 14.4*  NEUTROABS  --   --   --  11.7*  HGB 12.6* 15.1 11.9* 12.9*  HCT 37.6* 45.3 36.3* 39.1  MCV 81.6 83.1 84.0 84.3  PLT 158 207 239 [id number]   Basic Metabolic Panel:  Recent Labs Lab 12/07/15 0757 12/08/15 1836 12/09/15 0444 12/12/15 0635  NA 136  --  136 138  K 3.3*  --  4.2 4.6  CL 103  --  107 100*  CO2 24  --  22 27  GLUCOSE 109*  --  89 98  BUN 8  --  7 9  CREATININE 0.60*  --  0.63 0.64  CALCIUM 8.2*  --  8.5* 9.2  MG  --  2.1 2.0  --    GFR: Estimated Creatinine Clearance: 120.3 mL/min (by C-G formula based on SCr of 0.64 mg/dL). Liver Function Tests: No results for input(s): AST, ALT, ALKPHOS, BILITOT, PROT, ALBUMIN in the last 168 hours. No results for input(s): LIPASE, AMYLASE in the last 168 hours. No results for input(s): AMMONIA in the last 168 hours. Coagulation Profile: No results for input(s): INR, PROTIME in the last 168 hours. Cardiac Enzymes: No results for input(s): CKTOTAL, CKMB, CKMBINDEX, TROPONINI in the last 168 hours. BNP (last 3 results) No results for input(s): PROBNP in the last 8760 hours. HbA1C: No results for input(s): HGBA1C in the last 72 hours. CBG: No results for input(s): GLUCAP in the last 168 hours. Lipid Profile: No  results for input(s): CHOL, HDL, LDLCALC, TRIG, CHOLHDL, LDLDIRECT in the last 72 hours. Thyroid Function Tests: No results for input(s): TSH, T4TOTAL, FREET4, T3FREE, THYROIDAB in the last 72 hours. Anemia Panel: No results for input(s): VITAMINB12, FOLATE, FERRITIN, TIBC, IRON, RETICCTPCT in the last 72 hours. Sepsis Labs: No results for input(s): PROCALCITON, LATICACIDVEN in the last 168 hours.  Recent Results (from the past 240 hour(s))  Culture, blood (routine x 2)     Status: Abnormal   Collection Time: 12/04/15  7:20 PM  Result Value Ref Range Status   Specimen Description BLOOD LEFT ANTECUBITAL  Final   Special Requests BOTTLES DRAWN AEROBIC AND ANAEROBIC 5 CC  Final   Culture  Setup Time   Final    GRAM POSITIVE COCCI IN CLUSTERS IN BOTH AEROBIC AND ANAEROBIC BOTTLES CRITICAL RESULT CALLED TO, READ BACK BY AND VERIFIED WITH: P CLARK,RN AT [id number] 12/05/15 BY L BENFIELD Performed at [facility] (A)  Final   Report Status 12/07/2015 FINAL  Final  Organism ID, Bacteria STAPHYLOCOCCUS AUREUS  Final      Susceptibility   Staphylococcus aureus - MIC*    CIPROFLOXACIN <=0.5 SENSITIVE Sensitive     ERYTHROMYCIN >=8 RESISTANT Resistant     GENTAMICIN <=0.5 SENSITIVE Sensitive     OXACILLIN <=0.25 SENSITIVE Sensitive     TETRACYCLINE <=1 SENSITIVE Sensitive     VANCOMYCIN 1 SENSITIVE Sensitive     TRIMETH/SULFA <=10 SENSITIVE Sensitive     CLINDAMYCIN <=0.25 SENSITIVE Sensitive     RIFAMPIN <=0.5 SENSITIVE Sensitive     Inducible Clindamycin NEGATIVE Sensitive     * STAPHYLOCOCCUS AUREUS  Blood Culture ID Panel (Reflexed)     Status: Abnormal   Collection Time: 12/04/15  7:20 PM  Result Value Ref Range Status   Enterococcus species NOT DETECTED NOT DETECTED Final   Listeria monocytogenes NOT DETECTED NOT DETECTED Final   Staphylococcus species DETECTED (A) NOT DETECTED Final    Comment: CRITICAL RESULT CALLED TO, READ BACK BY AND VERIFIED  WITH: P CLARK,RN AT [id number] 12/05/15 BY L BENFIELD    Staphylococcus aureus DETECTED (A) NOT DETECTED Final    Comment: CRITICAL RESULT CALLED TO, READ BACK BY AND VERIFIED WITH: P CLARK,RN AT [id number] 12/05/15 BY L BENFIELD    Methicillin resistance NOT DETECTED NOT DETECTED Final   Streptococcus species NOT DETECTED NOT DETECTED Final   Streptococcus agalactiae NOT DETECTED NOT DETECTED Final   Streptococcus pneumoniae NOT DETECTED NOT DETECTED Final   Streptococcus pyogenes NOT DETECTED NOT DETECTED Final   Acinetobacter baumannii NOT DETECTED NOT DETECTED Final   Enterobacteriaceae species NOT DETECTED NOT DETECTED Final   Enterobacter cloacae complex NOT DETECTED NOT DETECTED Final   Escherichia coli NOT DETECTED NOT DETECTED Final   Klebsiella oxytoca NOT DETECTED NOT DETECTED Final   Klebsiella pneumoniae NOT DETECTED NOT DETECTED Final   Proteus species NOT DETECTED NOT DETECTED Final   Serratia marcescens NOT DETECTED NOT DETECTED Final   Haemophilus influenzae NOT DETECTED NOT DETECTED Final   Neisseria meningitidis NOT DETECTED NOT DETECTED Final   Pseudomonas aeruginosa NOT DETECTED NOT DETECTED Final   Candida albicans NOT DETECTED NOT DETECTED Final   Candida glabrata NOT DETECTED NOT DETECTED Final   Candida krusei NOT DETECTED NOT DETECTED Final   Candida parapsilosis NOT DETECTED NOT DETECTED Final   Candida tropicalis NOT DETECTED NOT DETECTED Final    Comment: Performed at [facility]  Culture, blood (routine x 2)     Status: Abnormal   Collection Time: 12/04/15  8:40 PM  Result Value Ref Range Status   Specimen Description BLOOD RIGHT HAND  Final   Special Requests BOTTLES DRAWN AEROBIC AND ANAEROBIC 5CC  Final   Culture  Setup Time   Final    GRAM POSITIVE COCCI IN CLUSTERS IN BOTH AEROBIC AND ANAEROBIC BOTTLES CRITICAL RESULT CALLED TO, READ BACK BY AND VERIFIED WITH: P CLARK,RN AT [id number] 12/05/15 BY L BENFIELD    Culture (A)  Final    STAPHYLOCOCCUS  AUREUS SUSCEPTIBILITIES PERFORMED ON PREVIOUS CULTURE WITHIN THE LAST 5 DAYS. Performed at [facility]    Report Status 12/07/2015 FINAL  Final  MRSA PCR Screening     Status: None   Collection Time: 12/05/15  3:38 PM  Result Value Ref Range Status   MRSA by PCR NEGATIVE NEGATIVE Final    Comment:        The GeneXpert MRSA Assay (FDA approved for NASAL specimens only), is one component of a comprehensive MRSA colonization surveillance  program. It is not intended to diagnose MRSA infection nor to guide or monitor treatment for MRSA infections.   Culture, blood (Routine X 2) w Reflex to ID Panel     Status: Abnormal   Collection Time: 12/07/15  5:30 PM  Result Value Ref Range Status   Specimen Description BLOOD RIGHT HAND  Final   Special Requests IN PEDIATRIC BOTTLE 1CC  Final   Culture  Setup Time   Final    GRAM POSITIVE COCCI IN CLUSTERS IN PEDIATRIC BOTTLE CRITICAL VALUE NOTED.  VALUE IS CONSISTENT WITH PREVIOUSLY REPORTED AND CALLED VALUE.    Culture (A)  Final    STAPHYLOCOCCUS AUREUS SUSCEPTIBILITIES PERFORMED ON PREVIOUS CULTURE WITHIN THE LAST 5 DAYS.    Report Status 12/10/2015 FINAL  Final  Culture, blood (Routine X 2) w Reflex to ID Panel     Status: Abnormal   Collection Time: 12/07/15  5:30 PM  Result Value Ref Range Status   Specimen Description BLOOD LEFT HAND  Final   Special Requests IN PEDIATRIC BOTTLE 2CC  Final   Culture  Setup Time   Final    GRAM POSITIVE COCCI IN CLUSTERS IN PEDIATRIC BOTTLE CRITICAL RESULT CALLED TO, READ BACK BY AND VERIFIED WITH: Leonie Green Pharm.D. 11:20 12/08/15 ([email or web address])    Culture STAPHYLOCOCCUS AUREUS (A)  Final   Report Status 12/10/2015 FINAL  Final   Organism ID, Bacteria STAPHYLOCOCCUS AUREUS  Final      Susceptibility   Staphylococcus aureus - MIC*    CIPROFLOXACIN <=0.5 SENSITIVE Sensitive     ERYTHROMYCIN >=8 RESISTANT Resistant     GENTAMICIN <=0.5 SENSITIVE Sensitive     OXACILLIN 0.5 SENSITIVE  Sensitive     TETRACYCLINE <=1 SENSITIVE Sensitive     VANCOMYCIN <=0.5 SENSITIVE Sensitive     TRIMETH/SULFA <=10 SENSITIVE Sensitive     CLINDAMYCIN <=0.25 SENSITIVE Sensitive     RIFAMPIN <=0.5 SENSITIVE Sensitive     Inducible Clindamycin NEGATIVE Sensitive     * STAPHYLOCOCCUS AUREUS  Blood Culture ID Panel (Reflexed)     Status: Abnormal   Collection Time: 12/07/15  5:30 PM  Result Value Ref Range Status   Enterococcus species NOT DETECTED NOT DETECTED Final   Listeria monocytogenes NOT DETECTED NOT DETECTED Final   Staphylococcus species DETECTED (A) NOT DETECTED Final    Comment: CRITICAL RESULT CALLED TO, READ BACK BY AND VERIFIED WITH: MRadford Pax Pharm.D. 11:20 12/08/15 ([email or web address])    Staphylococcus aureus DETECTED (A) NOT DETECTED Final    Comment: CRITICAL RESULT CALLED TO, READ BACK BY AND VERIFIED WITH: M. Radford Pax Pharm.D. 11:20 12/08/15 ([email or web address])    Methicillin resistance NOT DETECTED NOT DETECTED Final   Streptococcus species NOT DETECTED NOT DETECTED Final   Streptococcus agalactiae NOT DETECTED NOT DETECTED Final   Streptococcus pneumoniae NOT DETECTED NOT DETECTED Final   Streptococcus pyogenes NOT DETECTED NOT DETECTED Final   Acinetobacter baumannii NOT DETECTED NOT DETECTED Final   Enterobacteriaceae species NOT DETECTED NOT DETECTED Final   Enterobacter cloacae complex NOT DETECTED NOT DETECTED Final   Escherichia coli NOT DETECTED NOT DETECTED Final   Klebsiella oxytoca NOT DETECTED NOT DETECTED Final   Klebsiella pneumoniae NOT DETECTED NOT DETECTED Final   Proteus species NOT DETECTED NOT DETECTED Final   Serratia marcescens NOT DETECTED NOT DETECTED Final   Haemophilus influenzae NOT DETECTED NOT DETECTED Final   Neisseria meningitidis NOT DETECTED NOT DETECTED Final   Pseudomonas aeruginosa NOT DETECTED NOT DETECTED Final   Candida albicans NOT DETECTED NOT  DETECTED Final   Candida glabrata NOT DETECTED NOT DETECTED Final   Candida krusei NOT DETECTED NOT  DETECTED Final   Candida parapsilosis NOT DETECTED NOT DETECTED Final   Candida tropicalis NOT DETECTED NOT DETECTED Final  Culture, blood (Routine X 2) w Reflex to ID Panel     Status: Abnormal (Preliminary result)   Collection Time: 12/09/15  9:40 AM  Result Value Ref Range Status   Specimen Description BLOOD LEFT HAND  Final   Special Requests IN PEDIATRIC BOTTLE 4CC  Final   Culture  Setup Time   Final    GRAM POSITIVE COCCI IN CLUSTERS AEROBIC BOTTLE ONLY CRITICAL RESULT CALLED TO, READ BACK BY AND VERIFIED WITH: M TURNER 12/10/15 @ 1027 M VESTAL    Culture STAPHYLOCOCCUS AUREUS (A)  Final   Report Status PENDING  Incomplete   Organism ID, Bacteria STAPHYLOCOCCUS AUREUS  Final      Susceptibility   Staphylococcus aureus - MIC*    CIPROFLOXACIN <=0.5 SENSITIVE Sensitive     ERYTHROMYCIN >=8 RESISTANT Resistant     GENTAMICIN <=0.5 SENSITIVE Sensitive     OXACILLIN 0.5 SENSITIVE Sensitive     TETRACYCLINE <=1 SENSITIVE Sensitive     VANCOMYCIN 1 SENSITIVE Sensitive     TRIMETH/SULFA <=10 SENSITIVE Sensitive     CLINDAMYCIN <=0.25 SENSITIVE Sensitive     RIFAMPIN <=0.5 SENSITIVE Sensitive     Inducible Clindamycin NEGATIVE Sensitive     * STAPHYLOCOCCUS AUREUS  Culture, blood (Routine X 2) w Reflex to ID Panel     Status: None (Preliminary result)   Collection Time: 12/09/15  9:55 AM  Result Value Ref Range Status   Specimen Description BLOOD RIGHT HAND  Final   Special Requests IN PEDIATRIC BOTTLE 1CC  Final   Culture NO GROWTH 3 DAYS  Final   Report Status PENDING  Incomplete  Culture, blood (routine x 2)     Status: None (Preliminary result)   Collection Time: 12/10/15  4:17 PM  Result Value Ref Range Status   Specimen Description BLOOD RIGHT HAND  Final   Special Requests BOTTLES DRAWN AEROBIC ONLY 5CC  Final   Culture NO GROWTH 2 DAYS  Final   Report Status PENDING  Incomplete  Culture, blood (routine x 2)     Status: None (Preliminary result)   Collection Time:  12/10/15  4:22 PM  Result Value Ref Range Status   Specimen Description BLOOD RIGHT ARM  Final   Special Requests BOTTLES DRAWN AEROBIC ONLY 5CC  Final   Culture NO GROWTH 2 DAYS  Final   Report Status PENDING  Incomplete         Radiology Studies: No results found.      Scheduled Meds: .  ceFAZolin (ANCEF) IV  2 g Intravenous Q8H  . feeding supplement (ENSURE ENLIVE)  237 mL Oral Q24H   Continuous Infusions: . sodium chloride 20 mL/hr at 12/12/15 0734     LOS: 8 days     Cordelia Poche [facility] 12/13/2015, 1:55 PM Pager: [phone]  If 7PM-7AM, please contact night-coverage [email or web address] Password [email or web address] 12/13/2015, 1:55 PM

## 2015-12-13 NOTE — Progress Notes (Signed)
Nutrition Brief Note  Patient identified on the Malnutrition Screening Tool (MST) Report  Wt Readings from Last 15 Encounters:  12/05/15 129 lb 6.6 oz (58.7 kg)  12/04/15 130 lb (59 kg)  10/31/15 120 lb (54.4 kg)  03/05/15 134 lb 6.4 oz (61 kg)  02/26/15 132 lb 7.9 oz (60.1 kg)  11/19/14 142 lb 8 oz (64.6 kg)  11/16/14 156 lb 12 oz (71.1 kg)  11/08/14 148 lb (67.1 kg)  11/16/13 120 lb (54.4 kg)  04/16/13 122 lb 12.7 oz (55.7 kg) (6 %, Z= -1.59)*  12/01/11 133 lb 4 oz (60.4 kg) (24 %, Z= -0.72)*   * Growth percentiles are based on CDC 2-20 Years data.   Body mass index is 20.89 kg/m. Patient meets criteria for Normal/ healthy based on current BMI.   Current diet order is Regular, patient is consuming approximately 100% of meals at this time.   Spoke with patient about MST and reported poor appetite. He states that he  did not like the heart healthy diet and just wanted some salt. This was noted by MD yesterday and his diet is now Regular. He is glad to hear this. He denies any other problems at this time. RD offered snacks, supplements, meal choices. .   He asked if he thought a supplement would bring back his appetite. Suspect poor appetite is more related to his medical conditions and IV abx. He was open to trying a supplement though.  No nutrition interventions warranted at this time. If nutrition issues arise, please consult RD.   Burtis Junes RD, LDN, CNSC Clinical Nutrition Pager: [phone] 12/13/2015 9:24 AM

## 2015-12-14 ENCOUNTER — Inpatient Hospital Stay (HOSPITAL_COMMUNITY): Payer: 59

## 2015-12-14 DIAGNOSIS — I339 Acute and subacute endocarditis, unspecified: Secondary | ICD-10-CM

## 2015-12-14 LAB — ECHOCARDIOGRAM LIMITED
FS: 29 % (ref 28–44)
HEIGHTINCHES: 66 in
IVS/LV PW RATIO, ED: 0.8
LADIAMINDEX: 1.51 cm/m2
LASIZE: 25 mm
LEFT ATRIUM END SYS DIAM: 25 mm
LV e' LATERAL: 16.1 cm/s
LVOT area: 4.15 cm2
LVOTD: 23 mm
PW: 9.39 mm — AB (ref 0.6–1.1)
Reg peak vel: 239 cm/s
TAPSE: 23.6 mm
TDI e' lateral: 16.1
TDI e' medial: 12.2
TR max vel: 239 cm/s
WEIGHTICAEL: 2070.56 [oz_av]

## 2015-12-14 LAB — BASIC METABOLIC PANEL
ANION GAP: 7 (ref 5–15)
BUN: 10 mg/dL (ref 6–20)
CALCIUM: 9.1 mg/dL (ref 8.9–10.3)
CO2: 26 mmol/L (ref 22–32)
Chloride: 103 mmol/L (ref 101–111)
Creatinine, Ser: 0.58 mg/dL — ABNORMAL LOW (ref 0.61–1.24)
Glucose, Bld: 108 mg/dL — ABNORMAL HIGH (ref 65–99)
Potassium: 4.3 mmol/L (ref 3.5–5.1)
Sodium: 136 mmol/L (ref 135–145)

## 2015-12-14 LAB — CBC WITH DIFFERENTIAL/PLATELET
BASOS ABS: 0 10*3/uL (ref 0.0–0.1)
BASOS PCT: 0 %
Eosinophils Absolute: 0 10*3/uL (ref 0.0–0.7)
Eosinophils Relative: 0 %
HEMATOCRIT: 37.4 % — AB (ref 39.0–52.0)
Hemoglobin: 12.2 g/dL — ABNORMAL LOW (ref 13.0–17.0)
Lymphocytes Relative: 30 %
Lymphs Abs: 2.8 10*3/uL (ref 0.7–4.0)
MCH: 27.7 pg (ref 26.0–34.0)
MCHC: 32.6 g/dL (ref 30.0–36.0)
MCV: 84.8 fL (ref 78.0–100.0)
MONO ABS: 0.9 10*3/uL (ref 0.1–1.0)
Monocytes Relative: 10 %
NEUTROS ABS: 5.5 10*3/uL (ref 1.7–7.7)
NEUTROS PCT: 60 %
Platelets: 259 10*3/uL (ref 150–400)
RBC: 4.41 MIL/uL (ref 4.22–5.81)
RDW: 12.7 % (ref 11.5–15.5)
WBC: 9.2 10*3/uL (ref 4.0–10.5)

## 2015-12-14 LAB — CULTURE, BLOOD (ROUTINE X 2): CULTURE: NO GROWTH

## 2015-12-14 NOTE — Clinical Social Work Note (Signed)
Clinical Social Work Assessment  Patient Details  Name: Erik [name] MRN: [id number] Date of Birth: 1995/08/26  Date of referral:  12/14/15               Reason for consult:  Facility Placement                Permission sought to share information with:  Family Supports Permission granted to share information::  Yes, Verbal Permission Granted  Name::        Agency::     Relationship::     Contact Information:     Housing/Transportation Living arrangements for the past 2 months:  Apartment Source of Information:  Patient Patient Interpreter Needed:  None Criminal Activity/Legal Involvement Pertinent to Current Situation/Hospitalization:  No - Comment as needed Significant Relationships:  Friend Lives with:  Self Do you feel safe going back to the place where you live?  Yes Need for family participation in patient care:  No (Coment)  Care giving concerns: No care giving concerns identified at this time.   Social Worker assessment / plan:  CSW received consult for possible SNF placement. CSW engaged with pt at his bedside. CSW introduced herself and her role as a Education officer, museum. CSW asked pt about his current discharge plan. Pt states that he would like to return home however, the medical team is advising him to go to SNF to receive IV antibiotics. Pt states that he would much rather like to receive his medications on an outpatient basis. Pt disclosed that he has a history of drug abuse and that is why it is being recommended for him to receive his IV medications in an inpatient setting. CSW expressed her understanding and provided emotional support. Pt also explains that he has been sober for about 2 months. CSW congratulated pt and asked what has helped him stay sober. Pt stated that changing the people that he hangs around has been the most helpful in his battle to remain sober. At this time patient is not agreeable to SNF. CSW will leave notify weekday CSW to further explore pt's  options.  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  Patient/Family's Understanding of and Emotional Response to Diagnosis, Current Treatment, and Prognosis: Pt is appreciative of the care provided by CSW at this time.  Emotional Assessment Appearance:  Appears stated age Attitude/Demeanor/Rapport:  Complaining, Other (Cooperative) Affect (typically observed):  Appropriate, Pleasant Orientation:  Oriented to Self, Oriented to Place, Oriented to  Time, Oriented to Situation Alcohol / Substance use:  Other (Has been in recovery from drug use for 2 mo) Psych involvement (Current and /or in the community):  No (Comment)  Discharge Needs  Concerns to be addressed:  Discharge Planning Concerns Readmission within the last 30 days:  No Current discharge risk:  None Barriers to Discharge:  No Barriers Identified   Georga Kaufmann, [place] 12/14/2015, 4:15 PM

## 2015-12-14 NOTE — Progress Notes (Signed)
  Echocardiogram 2D Echocardiogram has been performed.  Johny Chess 12/14/2015, 11:48 AM

## 2015-12-14 NOTE — Progress Notes (Signed)
PROGRESS NOTE    Erik [name]  [id number] DOB: [birth year] DOA: 12/05/2015 PCP: Gwendolyn Grant, MD   Brief Narrative: Erik [name] is a 20 y.o. male with a history of intravenous drug use, anxiety, endocarditis, chronic hepatitis B presented after being informed of positive blood cultures that was obtained on 12/04/2015. Preliminary results show Staphylococcus aureus in 2 out of 2 sets. The patient initially presented to the emergency department on 12/04/2015 with chest pain and shortness of breath. The patient was given a dose of levofloxacin and discharged from the emergency department after blood cultures were obtained. The patient states that he last used heroin approximately 2 weeks prior to this admission. during his admission from 02/22/2015 through 02/26/2015,, the patient was admitted for treatment of rhabdomyolysis, cellulitisof bilateral feet, and elevated troponins. The patient also has had a previous admission back in September 2016 for narcotic induced respiratory failure requiring intubation. In addition, the patient has had a number of previous admissions in the past for narcotic and other illicit drug overdose.The patient was started on intravenous vancomycin and admitted for further evaluation of his bacteremia.   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 Blood cultures still growing bacteria. Large vegetation seen on 2 tricuspid valve leaflets on TEE. Cardiothoracic surgery evaluated sided no surgery at this time. Cultures from 10/5 so far no growth.  Leukocytosis resolved. -continue cefazolin per infectious disease recommendations -follow-up echo results today  Pulmonary opacities Septic emboli Secondary to MSSA bacteremia. Patient is symptomatic with pleuritic chest pain. -pain management  Hyponatremia Resolved  Thrombocytopenia Resolved  Chronic hepatitis B infection without, Pierce considered. No acute symptoms. Hepatitis C negative -Follow-up outpatient  Poor dentition Orthopantogram shows no fractures. -Follow-up with dentist outpatient  Chest pain Patient has a recent heart catheterization significant for normal coronary arteries. Troponin was borderline initially and has decreased. Likely secondary to pleuritic chest pain from embolic septic disease. -Continue Norco for pain -continue diclofenac prn  DVT prophylaxis: SCDs  Code Status: Full code  Family Communication: None at bedside  Disposition Plan: Discharge to SNF to continue IV antibiotics once blood cultures were negative for 5 days per ID recommendations   Consultants:   Infectious disease  Cardiology  Cardiothoracic surgery  Procedures:   TTE (12/07/2015)  TEE (12/09/2015)  TTE (12/14/2015)  Antimicrobials:  Vancomycin (9/29>>10/4)  Cefazolin (10/4>>   Subjective: Some trouble sleeping last night. No other complaints.  Objective: Vitals:   12/13/15 0501 12/13/15 1433 12/13/15 2214 12/14/15 0644  BP: (!) 98/58 (!) 95/52 (!) 98/52 (!) 93/50  Pulse: 78 (!) 128 81 63  Resp: 18 18 16 16   Temp: 98.2 F (36.8 C) 98.3 F (36.8 C) 98.8 F (37.1 C) 97.5 F (36.4 C)  TempSrc: Oral Oral Oral Oral  SpO2: 92% 98% 97% 99%  Weight:      Height:        Intake/Output Summary (Last 24 hours) at 12/14/15 1210 Last data filed at 12/14/15 0500  Gross per 24 hour  Intake              720  ml  Output                0 ml  Net              720 ml   Filed Weights   12/05/15 1540  Weight: 58.7 kg (129 lb 6.6 oz)    Examination:  General  exam: Appears calm and comfortable. Lying in bed Respiratory system: Clear to auscultation. Respiratory effort normal. Cardiovascular system: S1 & S2 heard, RRR. 2/6 systolic murmur Gastrointestinal system: Abdomen is nondistended, soft and nontender. No organomegaly or masses felt. Normal bowel sounds heard. Central nervous system: Alert and oriented. No focal neurological deficits. Extremities: No edema. No calf tenderness Skin: No cyanosis. No rashes. No petechiae Psychiatry: Judgement and insight appear normal. Mood & affect appropriate.     Data Reviewed: I have personally reviewed following labs and imaging studies  CBC:  Recent Labs Lab 12/08/15 1234 12/10/15 0457 12/12/15 0635 12/14/15 0529  WBC 12.7* 13.2* 14.4* 9.2  NEUTROABS  --   --  11.7* 5.5  HGB 15.1 11.9* 12.9* 12.2*  HCT 45.3 36.3* 39.1 37.4*  MCV 83.1 84.0 84.3 84.8  PLT 207 239 277 [id number]   Basic Metabolic Panel:  Recent Labs Lab 12/08/15 1836 12/09/15 0444 12/12/15 0635 12/14/15 0529  NA  --  136 138 136  K  --  4.2 4.6 4.3  CL  --  107 100* 103  CO2  --  22 27 26   GLUCOSE  --  89 98 108*  BUN  --  7 9 10   CREATININE  --  0.63 0.64 0.58*  CALCIUM  --  8.5* 9.2 9.1  MG 2.1 2.0  --   --    GFR: Estimated Creatinine Clearance: 120.3 mL/min (by C-G formula based on SCr of 0.58 mg/dL (L)). Liver Function Tests: No results for input(s): AST, ALT, ALKPHOS, BILITOT, PROT, ALBUMIN in the last 168 hours. No results for input(s): LIPASE, AMYLASE in the last 168 hours. No results for input(s): AMMONIA in the last 168 hours. Coagulation Profile: No results for input(s): INR, PROTIME in the last 168 hours. Cardiac Enzymes: No results for input(s): CKTOTAL, CKMB, CKMBINDEX, TROPONINI in the last 168 hours. BNP (last 3 results) No results for input(s): PROBNP in the last 8760 hours. HbA1C: No results for input(s): HGBA1C in the last 72 hours. CBG: No results for input(s): GLUCAP in the last 168  hours. Lipid Profile: No results for input(s): CHOL, HDL, LDLCALC, TRIG, CHOLHDL, LDLDIRECT in the last 72 hours. Thyroid Function Tests: No results for input(s): TSH, T4TOTAL, FREET4, T3FREE, THYROIDAB in the last 72 hours. Anemia Panel: No results for input(s): VITAMINB12, FOLATE, FERRITIN, TIBC, IRON, RETICCTPCT in the last 72 hours. Sepsis Labs: No results for input(s): PROCALCITON, LATICACIDVEN in the last 168 hours.  Recent Results (from the past 240 hour(s))  Culture, blood (routine x 2)     Status: Abnormal   Collection Time: 12/04/15  7:20 PM  Result Value Ref Range Status   Specimen Description BLOOD LEFT ANTECUBITAL  Final   Special Requests BOTTLES DRAWN AEROBIC AND ANAEROBIC 5 CC  Final   Culture  Setup Time   Final    GRAM POSITIVE COCCI IN CLUSTERS IN BOTH AEROBIC AND ANAEROBIC BOTTLES CRITICAL RESULT CALLED TO, READ BACK BY AND VERIFIED WITH: P CLARK,RN AT [id number] 12/05/15 BY L BENFIELD Performed at [place] (A)  Final   Report Status 12/07/2015 FINAL  Final   Organism ID, Bacteria STAPHYLOCOCCUS AUREUS  Final      Susceptibility   Staphylococcus aureus - MIC*    CIPROFLOXACIN <=0.5 SENSITIVE Sensitive     ERYTHROMYCIN >=8 RESISTANT Resistant     GENTAMICIN <=0.5 SENSITIVE Sensitive     OXACILLIN <=0.25 SENSITIVE Sensitive     TETRACYCLINE <=1 SENSITIVE Sensitive     VANCOMYCIN 1 SENSITIVE Sensitive     TRIMETH/SULFA <=10 SENSITIVE Sensitive     CLINDAMYCIN <=0.25 SENSITIVE Sensitive     RIFAMPIN <=0.5 SENSITIVE Sensitive     Inducible Clindamycin NEGATIVE Sensitive     * STAPHYLOCOCCUS AUREUS  Blood Culture ID Panel (Reflexed)     Status: Abnormal   Collection Time: 12/04/15  7:20 PM  Result Value Ref Range Status   Enterococcus species NOT DETECTED NOT DETECTED Final   Listeria monocytogenes NOT DETECTED NOT DETECTED Final   Staphylococcus species DETECTED (A) NOT DETECTED Final    Comment: CRITICAL RESULT CALLED  TO, READ BACK BY AND VERIFIED WITH: P CLARK,RN AT [id number] 12/05/15 BY L BENFIELD    Staphylococcus aureus DETECTED (A) NOT DETECTED Final    Comment: CRITICAL RESULT CALLED TO, READ BACK BY AND VERIFIED WITH: P CLARK,RN AT [id number] 12/05/15 BY L BENFIELD    Methicillin resistance NOT DETECTED NOT DETECTED Final   Streptococcus species NOT DETECTED NOT DETECTED Final   Streptococcus agalactiae NOT DETECTED NOT DETECTED Final   Streptococcus pneumoniae NOT DETECTED NOT DETECTED Final   Streptococcus pyogenes NOT DETECTED NOT DETECTED Final   Acinetobacter baumannii NOT DETECTED NOT DETECTED Final   Enterobacteriaceae species NOT DETECTED NOT DETECTED Final   Enterobacter cloacae complex NOT DETECTED NOT DETECTED Final   Escherichia coli NOT DETECTED NOT DETECTED Final   Klebsiella oxytoca NOT DETECTED NOT DETECTED Final   Klebsiella pneumoniae NOT DETECTED NOT DETECTED Final   Proteus species NOT DETECTED NOT DETECTED Final   Serratia marcescens NOT DETECTED NOT DETECTED Final   Haemophilus influenzae NOT DETECTED NOT DETECTED Final   Neisseria meningitidis NOT DETECTED NOT DETECTED Final   Pseudomonas aeruginosa NOT DETECTED NOT DETECTED Final   Candida albicans NOT DETECTED NOT DETECTED Final   Candida glabrata NOT DETECTED NOT DETECTED Final   Candida krusei NOT DETECTED NOT DETECTED Final   Candida parapsilosis NOT DETECTED NOT DETECTED Final   Candida tropicalis NOT DETECTED NOT DETECTED Final    Comment: Performed at [facility]  Culture, blood (routine x 2)     Status: Abnormal   Collection Time: 12/04/15  8:40 PM  Result Value Ref Range Status   Specimen Description BLOOD RIGHT HAND  Final   Special Requests BOTTLES DRAWN AEROBIC AND ANAEROBIC 5CC  Final   Culture  Setup Time   Final    GRAM POSITIVE COCCI IN CLUSTERS IN BOTH AEROBIC AND ANAEROBIC BOTTLES CRITICAL RESULT CALLED TO, READ BACK BY AND VERIFIED WITH: P CLARK,RN AT [id number] 12/05/15 BY L BENFIELD    Culture (A)   Final    STAPHYLOCOCCUS AUREUS SUSCEPTIBILITIES PERFORMED ON PREVIOUS CULTURE WITHIN THE LAST 5 DAYS. Performed at [facility]    Report Status 12/07/2015 FINAL  Final  MRSA PCR Screening     Status: None   Collection Time: 12/05/15  3:38 PM  Result Value Ref Range Status   MRSA by PCR NEGATIVE NEGATIVE Final    Comment:        The GeneXpert MRSA Assay (FDA approved for NASAL specimens only), is one component of a comprehensive  MRSA colonization surveillance program. It is not intended to diagnose MRSA infection nor to guide or monitor treatment for MRSA infections.   Culture, blood (Routine X 2) w Reflex to ID Panel     Status: Abnormal   Collection Time: 12/07/15  5:30 PM  Result Value Ref Range Status   Specimen Description BLOOD RIGHT HAND  Final   Special Requests IN PEDIATRIC BOTTLE 1CC  Final   Culture  Setup Time   Final    GRAM POSITIVE COCCI IN CLUSTERS IN PEDIATRIC BOTTLE CRITICAL VALUE NOTED.  VALUE IS CONSISTENT WITH PREVIOUSLY REPORTED AND CALLED VALUE.    Culture (A)  Final    STAPHYLOCOCCUS AUREUS SUSCEPTIBILITIES PERFORMED ON PREVIOUS CULTURE WITHIN THE LAST 5 DAYS.    Report Status 12/10/2015 FINAL  Final  Culture, blood (Routine X 2) w Reflex to ID Panel     Status: Abnormal   Collection Time: 12/07/15  5:30 PM  Result Value Ref Range Status   Specimen Description BLOOD LEFT HAND  Final   Special Requests IN PEDIATRIC BOTTLE 2CC  Final   Culture  Setup Time   Final    GRAM POSITIVE COCCI IN CLUSTERS IN PEDIATRIC BOTTLE CRITICAL RESULT CALLED TO, READ BACK BY AND VERIFIED WITH: Leonie Green Pharm.D. 11:20 12/08/15 ([email or web address])    Culture STAPHYLOCOCCUS AUREUS (A)  Final   Report Status 12/10/2015 FINAL  Final   Organism ID, Bacteria STAPHYLOCOCCUS AUREUS  Final      Susceptibility   Staphylococcus aureus - MIC*    CIPROFLOXACIN <=0.5 SENSITIVE Sensitive     ERYTHROMYCIN >=8 RESISTANT Resistant     GENTAMICIN <=0.5 SENSITIVE Sensitive      OXACILLIN 0.5 SENSITIVE Sensitive     TETRACYCLINE <=1 SENSITIVE Sensitive     VANCOMYCIN <=0.5 SENSITIVE Sensitive     TRIMETH/SULFA <=10 SENSITIVE Sensitive     CLINDAMYCIN <=0.25 SENSITIVE Sensitive     RIFAMPIN <=0.5 SENSITIVE Sensitive     Inducible Clindamycin NEGATIVE Sensitive     * STAPHYLOCOCCUS AUREUS  Blood Culture ID Panel (Reflexed)     Status: Abnormal   Collection Time: 12/07/15  5:30 PM  Result Value Ref Range Status   Enterococcus species NOT DETECTED NOT DETECTED Final   Listeria monocytogenes NOT DETECTED NOT DETECTED Final   Staphylococcus species DETECTED (A) NOT DETECTED Final    Comment: CRITICAL RESULT CALLED TO, READ BACK BY AND VERIFIED WITH: MRadford Pax Pharm.D. 11:20 12/08/15 ([email or web address])    Staphylococcus aureus DETECTED (A) NOT DETECTED Final    Comment: CRITICAL RESULT CALLED TO, READ BACK BY AND VERIFIED WITH: M. Radford Pax Pharm.D. 11:20 12/08/15 ([email or web address])    Methicillin resistance NOT DETECTED NOT DETECTED Final   Streptococcus species NOT DETECTED NOT DETECTED Final   Streptococcus agalactiae NOT DETECTED NOT DETECTED Final   Streptococcus pneumoniae NOT DETECTED NOT DETECTED Final   Streptococcus pyogenes NOT DETECTED NOT DETECTED Final   Acinetobacter baumannii NOT DETECTED NOT DETECTED Final   Enterobacteriaceae species NOT DETECTED NOT DETECTED Final   Enterobacter cloacae complex NOT DETECTED NOT DETECTED Final   Escherichia coli NOT DETECTED NOT DETECTED Final   Klebsiella oxytoca NOT DETECTED NOT DETECTED Final   Klebsiella pneumoniae NOT DETECTED NOT DETECTED Final   Proteus species NOT DETECTED NOT DETECTED Final   Serratia marcescens NOT DETECTED NOT DETECTED Final   Haemophilus influenzae NOT DETECTED NOT DETECTED Final   Neisseria meningitidis NOT DETECTED NOT DETECTED Final   Pseudomonas aeruginosa NOT DETECTED NOT DETECTED Final   Candida albicans  NOT DETECTED NOT DETECTED Final   Candida glabrata NOT DETECTED NOT DETECTED Final   Candida  krusei NOT DETECTED NOT DETECTED Final   Candida parapsilosis NOT DETECTED NOT DETECTED Final   Candida tropicalis NOT DETECTED NOT DETECTED Final  Culture, blood (Routine X 2) w Reflex to ID Panel     Status: Abnormal   Collection Time: 12/09/15  9:40 AM  Result Value Ref Range Status   Specimen Description BLOOD LEFT HAND  Final   Special Requests IN PEDIATRIC BOTTLE 4CC  Final   Culture  Setup Time   Final    GRAM POSITIVE COCCI IN CLUSTERS AEROBIC BOTTLE ONLY CRITICAL RESULT CALLED TO, READ BACK BY AND VERIFIED WITH: M TURNER 12/10/15 @ 1027 M VESTAL    Culture STAPHYLOCOCCUS AUREUS (A)  Final   Report Status 12/13/2015 FINAL  Final   Organism ID, Bacteria STAPHYLOCOCCUS AUREUS  Final      Susceptibility   Staphylococcus aureus - MIC*    CIPROFLOXACIN <=0.5 SENSITIVE Sensitive     ERYTHROMYCIN >=8 RESISTANT Resistant     GENTAMICIN <=0.5 SENSITIVE Sensitive     OXACILLIN 0.5 SENSITIVE Sensitive     TETRACYCLINE <=1 SENSITIVE Sensitive     VANCOMYCIN 1 SENSITIVE Sensitive     TRIMETH/SULFA <=10 SENSITIVE Sensitive     CLINDAMYCIN <=0.25 SENSITIVE Sensitive     RIFAMPIN <=0.5 SENSITIVE Sensitive     Inducible Clindamycin NEGATIVE Sensitive     * STAPHYLOCOCCUS AUREUS  Culture, blood (Routine X 2) w Reflex to ID Panel     Status: None (Preliminary result)   Collection Time: 12/09/15  9:55 AM  Result Value Ref Range Status   Specimen Description BLOOD RIGHT HAND  Final   Special Requests IN PEDIATRIC BOTTLE 1CC  Final   Culture NO GROWTH 4 DAYS  Final   Report Status PENDING  Incomplete  Culture, blood (routine x 2)     Status: None (Preliminary result)   Collection Time: 12/10/15  4:17 PM  Result Value Ref Range Status   Specimen Description BLOOD RIGHT HAND  Final   Special Requests BOTTLES DRAWN AEROBIC ONLY 5CC  Final   Culture NO GROWTH 3 DAYS  Final   Report Status PENDING  Incomplete  Culture, blood (routine x 2)     Status: None (Preliminary result)   Collection  Time: 12/10/15  4:22 PM  Result Value Ref Range Status   Specimen Description BLOOD RIGHT ARM  Final   Special Requests BOTTLES DRAWN AEROBIC ONLY 5CC  Final   Culture NO GROWTH 3 DAYS  Final   Report Status PENDING  Incomplete         Radiology Studies: No results found.      Scheduled Meds: .  ceFAZolin (ANCEF) IV  2 g Intravenous Q8H  . feeding supplement (ENSURE ENLIVE)  237 mL Oral Q24H   Continuous Infusions: . sodium chloride 20 mL/hr at 12/12/15 0734     LOS: 9 days     Cordelia Poche [facility] 12/14/2015, 12:10 PM Pager: [phone]  If 7PM-7AM, please contact night-coverage [email or web address] Password [email or web address] 12/14/2015, 12:10 PM

## 2015-12-15 LAB — CULTURE, BLOOD (ROUTINE X 2)
CULTURE: NO GROWTH
Culture: NO GROWTH

## 2015-12-15 NOTE — Progress Notes (Signed)
PROGRESS NOTE    Erik [name]  [id number] DOB: [birth year] DOA: 12/05/2015 PCP: Gwendolyn Grant, MD   Brief Narrative: Erik [name] is a 20 y.o. male with a history of intravenous drug use, anxiety, endocarditis, chronic hepatitis B presented after being informed of positive blood cultures that was obtained on 12/04/2015. Preliminary results show Staphylococcus aureus in 2 out of 2 sets. The patient initially presented to the emergency department on 12/04/2015 with chest pain and shortness of breath. The patient was given a dose of levofloxacin and discharged from the emergency department after blood cultures were obtained. The patient states that he last used heroin approximately 2 weeks prior to this admission. during his admission from 02/22/2015 through 02/26/2015,, the patient was admitted for treatment of rhabdomyolysis, cellulitisof bilateral feet, and elevated troponins. The patient also has had a previous admission back in September 2016 for narcotic induced respiratory failure requiring intubation. In addition, the patient has had a number of previous admissions in the past for narcotic and other illicit drug overdose.The patient was started on intravenous vancomycin and admitted for further evaluation of his bacteremia. He was switched to cefazolin for better coverage and has tolerated it well. Repeat TTE significant for persistent vegetation and tricuspid regurgitation.   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 Blood cultures still growing bacteria. Large  vegetation seen on 2 tricuspid valve leaflets on TEE. Cardiothoracic surgery evaluated sided no surgery at this time. Cultures from 10/5 so far no growth x4 days (will be x5 days later this afternoon). Leukocytosis resolved. Echo unchanged from previous. -continue cefazolin per infectious disease recommendations  Pulmonary opacities Septic emboli Secondary to MSSA bacteremia. Patient is symptomatic with pleuritic chest pain. -norco and diclofenac prn  Hyponatremia Resolved  Thrombocytopenia Resolved  Chronic hepatitis B infection without coma No acute symptoms. Hepatitis C negative -Follow-up outpatient  Poor dentition Orthopantogram shows no fractures. -Follow-up with dentist outpatient  Chest pain Patient has a recent heart catheterization significant for normal coronary arteries. Troponin was borderline initially and has decreased. Likely secondary to pleuritic chest pain from embolic septic disease. -Continue Norco for pain -continue diclofenac prn  DVT prophylaxis: SCDs  Code Status: Full code  Family Communication: None at bedside  Disposition Plan: Discharge to SNF to continue IV antibiotics once blood cultures were negative for 5 days per ID recommendations   Consultants:   Infectious disease  Cardiology  Cardiothoracic surgery  Procedures:   TTE (12/07/2015)  TEE (12/09/2015)  TTE (12/14/2015)  Antimicrobials:  Vancomycin (9/29>>10/4)  Cefazolin (10/4>>   Subjective: Eager to leave. No complaints.  Objective: Vitals:   12/14/15 0644 12/14/15 1342 12/14/15 2144 12/15/15 0553  BP: (!) 93/50 102/68 (!) 107/58 100/64  Pulse: 63 87 90 77  Resp: 16 17 17 16   Temp: 97.5 F (36.4 C) 98.3 F (36.8 C) 99.6 F (37.6 C) 99.9 F (37.7 C)  TempSrc: Oral Oral Oral Oral  SpO2: 99% 100% 95% 100%  Weight:      Height:        Intake/Output Summary (Last 24 hours) at 12/15/15 1151 Last  data filed at 12/15/15 1117  Gross per 24 hour  Intake               960 ml  Output                0 ml  Net              960 ml   Filed Weights   12/05/15 1540  Weight: 58.7 kg (129 lb 6.6 oz)    Examination:  General exam: Appears calm and comfortable. sitting up in bed Respiratory system: Clear to auscultation. Respiratory effort normal. Cardiovascular system: S1 & S2 heard, RRR. 2/6 systolic murmur Gastrointestinal system: Abdomen is nondistended, soft and nontender. No organomegaly or masses felt. Normal bowel sounds heard. Central nervous system: Alert and oriented. No focal neurological deficits. Extremities: No edema. No calf tenderness Skin: No cyanosis. No rashes. No petechiae Psychiatry: Judgement and insight appear normal. Mood & affect appropriate.     Data Reviewed: I have personally reviewed following labs and imaging studies  CBC:  Recent Labs Lab 12/08/15 1234 12/10/15 0457 12/12/15 0635 12/14/15 0529  WBC 12.7* 13.2* 14.4* 9.2  NEUTROABS  --   --  11.7* 5.5  HGB 15.1 11.9* 12.9* 12.2*  HCT 45.3 36.3* 39.1 37.4*  MCV 83.1 84.0 84.3 84.8  PLT 207 239 277 [id number]   Basic Metabolic Panel:  Recent Labs Lab 12/08/15 1836 12/09/15 0444 12/12/15 0635 12/14/15 0529  NA  --  136 138 136  K  --  4.2 4.6 4.3  CL  --  107 100* 103  CO2  --  22 27 26   GLUCOSE  --  89 98 108*  BUN  --  7 9 10   CREATININE  --  0.63 0.64 0.58*  CALCIUM  --  8.5* 9.2 9.1  MG 2.1 2.0  --   --    GFR: Estimated Creatinine Clearance: 120.3 mL/min (by C-G formula based on SCr of 0.58 mg/dL (L)). Liver Function Tests: No results for input(s): AST, ALT, ALKPHOS, BILITOT, PROT, ALBUMIN in the last 168 hours. No results for input(s): LIPASE, AMYLASE in the last 168 hours. No results for input(s): AMMONIA in the last 168 hours. Coagulation Profile: No results for input(s): INR, PROTIME in the last 168 hours. Cardiac Enzymes: No results for input(s): CKTOTAL, CKMB, CKMBINDEX, TROPONINI in the last 168 hours. BNP (last 3 results) No results for  input(s): PROBNP in the last 8760 hours. HbA1C: No results for input(s): HGBA1C in the last 72 hours. CBG: No results for input(s): GLUCAP in the last 168 hours. Lipid Profile: No results for input(s): CHOL, HDL, LDLCALC, TRIG, CHOLHDL, LDLDIRECT in the last 72 hours. Thyroid Function Tests: No results for input(s): TSH, T4TOTAL, FREET4, T3FREE, THYROIDAB in the last 72 hours. Anemia Panel: No results for input(s): VITAMINB12, FOLATE, FERRITIN, TIBC, IRON, RETICCTPCT in the last 72 hours. Sepsis Labs: No results for input(s): PROCALCITON, LATICACIDVEN in the last 168 hours.  Recent Results (from the past 240 hour(s))  MRSA PCR Screening     Status: None   Collection Time: 12/05/15  3:38 PM  Result Value Ref Range Status   MRSA by PCR NEGATIVE NEGATIVE Final    Comment:        The GeneXpert MRSA Assay (FDA approved for NASAL specimens only), is one component of a comprehensive MRSA colonization surveillance program. It is not intended to diagnose MRSA infection nor to guide or monitor treatment for MRSA infections.   Culture,  blood (Routine X 2) w Reflex to ID Panel     Status: Abnormal   Collection Time: 12/07/15  5:30 PM  Result Value Ref Range Status   Specimen Description BLOOD RIGHT HAND  Final   Special Requests IN PEDIATRIC BOTTLE 1CC  Final   Culture  Setup Time   Final    GRAM POSITIVE COCCI IN CLUSTERS IN PEDIATRIC BOTTLE CRITICAL VALUE NOTED.  VALUE IS CONSISTENT WITH PREVIOUSLY REPORTED AND CALLED VALUE.    Culture (A)  Final    STAPHYLOCOCCUS AUREUS SUSCEPTIBILITIES PERFORMED ON PREVIOUS CULTURE WITHIN THE LAST 5 DAYS.    Report Status 12/10/2015 FINAL  Final  Culture, blood (Routine X 2) w Reflex to ID Panel     Status: Abnormal   Collection Time: 12/07/15  5:30 PM  Result Value Ref Range Status   Specimen Description BLOOD LEFT HAND  Final   Special Requests IN PEDIATRIC BOTTLE 2CC  Final   Culture  Setup Time   Final    GRAM POSITIVE COCCI IN  CLUSTERS IN PEDIATRIC BOTTLE CRITICAL RESULT CALLED TO, READ BACK BY AND VERIFIED WITH: Leonie Green Pharm.D. 11:20 12/08/15 ([email or web address])    Culture STAPHYLOCOCCUS AUREUS (A)  Final   Report Status 12/10/2015 FINAL  Final   Organism ID, Bacteria STAPHYLOCOCCUS AUREUS  Final      Susceptibility   Staphylococcus aureus - MIC*    CIPROFLOXACIN <=0.5 SENSITIVE Sensitive     ERYTHROMYCIN >=8 RESISTANT Resistant     GENTAMICIN <=0.5 SENSITIVE Sensitive     OXACILLIN 0.5 SENSITIVE Sensitive     TETRACYCLINE <=1 SENSITIVE Sensitive     VANCOMYCIN <=0.5 SENSITIVE Sensitive     TRIMETH/SULFA <=10 SENSITIVE Sensitive     CLINDAMYCIN <=0.25 SENSITIVE Sensitive     RIFAMPIN <=0.5 SENSITIVE Sensitive     Inducible Clindamycin NEGATIVE Sensitive     * STAPHYLOCOCCUS AUREUS  Blood Culture ID Panel (Reflexed)     Status: Abnormal   Collection Time: 12/07/15  5:30 PM  Result Value Ref Range Status   Enterococcus species NOT DETECTED NOT DETECTED Final   Listeria monocytogenes NOT DETECTED NOT DETECTED Final   Staphylococcus species DETECTED (A) NOT DETECTED Final    Comment: CRITICAL RESULT CALLED TO, READ BACK BY AND VERIFIED WITH: MRadford Pax Pharm.D. 11:20 12/08/15 ([email or web address])    Staphylococcus aureus DETECTED (A) NOT DETECTED Final    Comment: CRITICAL RESULT CALLED TO, READ BACK BY AND VERIFIED WITH: M. Radford Pax Pharm.D. 11:20 12/08/15 ([email or web address])    Methicillin resistance NOT DETECTED NOT DETECTED Final   Streptococcus species NOT DETECTED NOT DETECTED Final   Streptococcus agalactiae NOT DETECTED NOT DETECTED Final   Streptococcus pneumoniae NOT DETECTED NOT DETECTED Final   Streptococcus pyogenes NOT DETECTED NOT DETECTED Final   Acinetobacter baumannii NOT DETECTED NOT DETECTED Final   Enterobacteriaceae species NOT DETECTED NOT DETECTED Final   Enterobacter cloacae complex NOT DETECTED NOT DETECTED Final   Escherichia coli NOT DETECTED NOT DETECTED Final   Klebsiella oxytoca NOT DETECTED NOT  DETECTED Final   Klebsiella pneumoniae NOT DETECTED NOT DETECTED Final   Proteus species NOT DETECTED NOT DETECTED Final   Serratia marcescens NOT DETECTED NOT DETECTED Final   Haemophilus influenzae NOT DETECTED NOT DETECTED Final   Neisseria meningitidis NOT DETECTED NOT DETECTED Final   Pseudomonas aeruginosa NOT DETECTED NOT DETECTED Final   Candida albicans NOT DETECTED NOT DETECTED Final   Candida glabrata NOT DETECTED NOT DETECTED Final   Candida krusei NOT DETECTED NOT DETECTED Final  Candida parapsilosis NOT DETECTED NOT DETECTED Final   Candida tropicalis NOT DETECTED NOT DETECTED Final  Culture, blood (Routine X 2) w Reflex to ID Panel     Status: Abnormal   Collection Time: 12/09/15  9:40 AM  Result Value Ref Range Status   Specimen Description BLOOD LEFT HAND  Final   Special Requests IN PEDIATRIC BOTTLE 4CC  Final   Culture  Setup Time   Final    GRAM POSITIVE COCCI IN CLUSTERS AEROBIC BOTTLE ONLY CRITICAL RESULT CALLED TO, READ BACK BY AND VERIFIED WITH: M TURNER 12/10/15 @ 1027 M VESTAL    Culture STAPHYLOCOCCUS AUREUS (A)  Final   Report Status 12/13/2015 FINAL  Final   Organism ID, Bacteria STAPHYLOCOCCUS AUREUS  Final      Susceptibility   Staphylococcus aureus - MIC*    CIPROFLOXACIN <=0.5 SENSITIVE Sensitive     ERYTHROMYCIN >=8 RESISTANT Resistant     GENTAMICIN <=0.5 SENSITIVE Sensitive     OXACILLIN 0.5 SENSITIVE Sensitive     TETRACYCLINE <=1 SENSITIVE Sensitive     VANCOMYCIN 1 SENSITIVE Sensitive     TRIMETH/SULFA <=10 SENSITIVE Sensitive     CLINDAMYCIN <=0.25 SENSITIVE Sensitive     RIFAMPIN <=0.5 SENSITIVE Sensitive     Inducible Clindamycin NEGATIVE Sensitive     * STAPHYLOCOCCUS AUREUS  Culture, blood (Routine X 2) w Reflex to ID Panel     Status: None   Collection Time: 12/09/15  9:55 AM  Result Value Ref Range Status   Specimen Description BLOOD RIGHT HAND  Final   Special Requests IN PEDIATRIC BOTTLE 1CC  Final   Culture NO GROWTH 5 DAYS   Final   Report Status 12/14/2015 FINAL  Final  Culture, blood (routine x 2)     Status: None   Collection Time: 12/10/15  4:17 PM  Result Value Ref Range Status   Specimen Description BLOOD RIGHT HAND  Final   Special Requests BOTTLES DRAWN AEROBIC ONLY 5CC  Final   Culture NO GROWTH 5 DAYS  Final   Report Status 12/15/2015 FINAL  Final  Culture, blood (routine x 2)     Status: None   Collection Time: 12/10/15  4:22 PM  Result Value Ref Range Status   Specimen Description BLOOD RIGHT ARM  Final   Special Requests BOTTLES DRAWN AEROBIC ONLY 5CC  Final   Culture NO GROWTH 5 DAYS  Final   Report Status 12/15/2015 FINAL  Final         Radiology Studies: No results found.      Scheduled Meds: .  ceFAZolin (ANCEF) IV  2 g Intravenous Q8H  . feeding supplement (ENSURE ENLIVE)  237 mL Oral Q24H   Continuous Infusions: . sodium chloride 20 mL/hr at 12/12/15 0734     LOS: 10 days     Cordelia Poche [facility] 12/15/2015, 11:51 AM Pager: [phone]  If 7PM-7AM, please contact night-coverage [email or web address] Password [email or web address] 12/15/2015, 11:51 AM

## 2015-12-15 NOTE — Progress Notes (Signed)
Pharmacy Antibiotic Note Erik [name] is a 20 y.o. male admitted on 12/05/2015 with MSSA bacteremia and tricuspid valve endocarditis. Initially started on vancomycin due to PCN allergy but blood cultures remained positive and was transitioned to cefazolin 12/10/15.  Tolerating cefazolin without any issues. Repeat blood cx's from 10/4 are currently negative.   Plan: - Continue cefazolin 2g IV Q8H    Height: 5\' 6"  (167.6 cm) Weight: 129 lb 6.6 oz (58.7 kg) IBW/kg (Calculated) : 63.8  Temp (24hrs), Avg:99.3 F (37.4 C), Min:98.3 F (36.8 C), Max:99.9 F (37.7 C)   Recent Labs Lab 12/08/15 1230 12/08/15 1234 12/09/15 0444 12/10/15 0457 12/12/15 0635 12/14/15 0529  WBC  --  12.7*  --  13.2* 14.4* 9.2  CREATININE  --   --  0.63  --  0.64 0.58*  VANCOTROUGH 14*  --   --  19  --   --     Estimated Creatinine Clearance: 120.3 mL/min (by C-G formula based on SCr of 0.58 mg/dL (L)).    Allergies  Allergen Reactions  . Penicillins Swelling    Per mom- when he was 6 or 7yo his bottom lip swelled  . Sulfa Antibiotics Swelling and Rash    Childhood allergic reaction    Antimicrobials this admission: Vanc 9/29 >>10/4  Cefazolin 10/4>>  Dose adjustments this admission: 10/1 VT = 6 on 750mg  IV q8h: Dose increased to 1000mg  Q6H 10/2 VT = 14 on 1gm q6h: Dose changed to avoid q6h interval  Microbiology results: 9/28 BCx x4: MSSA 10/1 BCx: MSSA 10/3 BCx: BCID- presumptive MSSA 10/4 BCx: ngtd   Thank you for allowing [place] to participate in this patients care. Jens Som, PharmD Pager: [phone] 12/15/2015, 10:58 AM

## 2015-12-15 NOTE — Progress Notes (Signed)
Subjective:  He has ambivalent feelings about what to do at discharge. He understands that the although he states emphatically that he can use IV drugs no matter where he goes and that going to the nursing facility will not prevent that. I explained though that he would not be able to go home with IV antibiotics via a PICC line.  He has mixed feelings at present and would like to think about this further he knows at the nursing facility is a better option for giving him long-term IV antibiotics but he has a strong dislike of being in a facility for some much time.  The other option is oral Zyvox but this will be suboptimal for someone with a severe infection such as he has.     Antibiotics:  Anti-infectives    Start     Dose/Rate Route Frequency Ordered Stop   12/10/15 1700  ceFAZolin (ANCEF) IVPB 2g/100 mL premix     2 g 200 mL/hr over 30 Minutes Intravenous Every 8 hours 12/10/15 1642     12/08/15 2200  vancomycin (VANCOCIN) 1,500 mg in sodium chloride 0.9 % 500 mL IVPB  Status:  Discontinued     1,500 mg 250 mL/hr over 120 Minutes Intravenous Every 8 hours 12/08/15 1447 12/10/15 1640   12/07/15 1000  vancomycin (VANCOCIN) 1,750 mg in sodium chloride 0.9 % 500 mL IVPB  Status:  Discontinued     1,750 mg 250 mL/hr over 120 Minutes Intravenous Every 8 hours 12/07/15 0947 12/07/15 0957   12/07/15 1000  vancomycin (VANCOCIN) IVPB 1000 mg/200 mL premix  Status:  Discontinued     1,000 mg 200 mL/hr over 60 Minutes Intravenous Every 6 hours 12/07/15 0958 12/08/15 1447   12/06/15 0100  vancomycin (VANCOCIN) IVPB 750 mg/150 ml premix  Status:  Discontinued     750 mg 150 mL/hr over 60 Minutes Intravenous Every 8 hours 12/05/15 1605 12/07/15 0947   12/05/15 1615  vancomycin (VANCOCIN) IVPB 1000 mg/200 mL premix     1,000 mg 200 mL/hr over 60 Minutes Intravenous  Once 12/05/15 1605 12/05/15 2008      Medications: Scheduled Meds: .  ceFAZolin (ANCEF) IV  2 g Intravenous Q8H    . feeding supplement (ENSURE ENLIVE)  237 mL Oral Q24H   Continuous Infusions: . sodium chloride 20 mL/hr at 12/12/15 0734   PRN Meds:.acetaminophen **OR** acetaminophen, bisacodyl, diclofenac, HYDROcodone-acetaminophen, ondansetron **OR** ondansetron (ZOFRAN) IV, traZODone    Objective: Weight change:   Intake/Output Summary (Last 24 hours) at 12/15/15 1218 Last data filed at 12/15/15 1117  Gross per 24 hour  Intake              960 ml  Output                0 ml  Net              960 ml   Blood pressure 100/64, pulse 77, temperature 99.9 F (37.7 C), temperature source Oral, resp. rate 16, height 5' 6"  (1.676 m), weight 129 lb 6.6 oz (58.7 kg), SpO2 100 %. Temp:  [98.3 F (36.8 C)-99.9 F (37.7 C)] 99.9 F (37.7 C) (10/09 0553) Pulse Rate:  [77-90] 77 (10/09 0553) Resp:  [16-17] 16 (10/09 0553) BP: (100-107)/(58-68) 100/64 (10/09 0553) SpO2:  [95 %-100 %] 100 % (10/09 0553)  Physical Exam: General: Alert and awake, oriented x3, not in any acute distress. HEENT: anicteric sclera,  Conjunctiva are clear ,EOMI,  oropharynx clear and without exudate, Cardiovascular: tachycardic rate, normal r,  no murmur rubs or gallops Pulmonary: clear to auscultation bilaterally, no wheezing, rales or rhonchi, really having full breaths due to pleuritic chest pain Gastrointestinal: soft nontender, nondistended, normal bowel sounds, Musculoskeletal: no  clubbing or edema noted bilaterally,  Skin, soft tissue: no rashes, tattoos present, or Janeway lesions splitter hemorrhages or Osler's nodes Neuro: nonfocal, strength and sensation intact  CBC:  CBC Latest Ref Rng & Units 12/14/2015 12/12/2015 12/10/2015  WBC 4.0 - 10.5 K/uL 9.2 14.4(H) 13.2(H)  Hemoglobin 13.0 - 17.0 g/dL 12.2(L) 12.9(L) 11.9(L)  Hematocrit 39.0 - 52.0 % 37.4(L) 39.1 36.3(L)  Platelets 150 - 400 K/uL 259 277 239     BMET  Recent Labs  12/14/15 0529  NA 136  K 4.3  CL 103  CO2 26  GLUCOSE 108*  BUN 10   CREATININE 0.58*  CALCIUM 9.1     Liver Panel  No results for input(s): PROT, ALBUMIN, AST, ALT, ALKPHOS, BILITOT, BILIDIR, IBILI in the last 72 hours.     Sedimentation Rate No results for input(s): ESRSEDRATE in the last 72 hours. C-Reactive Protein No results for input(s): CRP in the last 72 hours.  Micro Results: Recent Results (from the past 720 hour(s))  Culture, blood (routine x 2)     Status: Abnormal   Collection Time: 12/04/15  7:20 PM  Result Value Ref Range Status   Specimen Description BLOOD LEFT ANTECUBITAL  Final   Special Requests BOTTLES DRAWN AEROBIC AND ANAEROBIC 5 CC  Final   Culture  Setup Time   Final    GRAM POSITIVE COCCI IN CLUSTERS IN BOTH AEROBIC AND ANAEROBIC BOTTLES CRITICAL RESULT CALLED TO, READ BACK BY AND VERIFIED WITH: P CLARK,RN AT 3154 12/05/15 BY L BENFIELD Performed at [facility] (A)  Final   Report Status 12/07/2015 FINAL  Final   Organism ID, Bacteria STAPHYLOCOCCUS AUREUS  Final      Susceptibility   Staphylococcus aureus - MIC*    CIPROFLOXACIN <=0.5 SENSITIVE Sensitive     ERYTHROMYCIN >=8 RESISTANT Resistant     GENTAMICIN <=0.5 SENSITIVE Sensitive     OXACILLIN <=0.25 SENSITIVE Sensitive     TETRACYCLINE <=1 SENSITIVE Sensitive     VANCOMYCIN 1 SENSITIVE Sensitive     TRIMETH/SULFA <=10 SENSITIVE Sensitive     CLINDAMYCIN <=0.25 SENSITIVE Sensitive     RIFAMPIN <=0.5 SENSITIVE Sensitive     Inducible Clindamycin NEGATIVE Sensitive     * STAPHYLOCOCCUS AUREUS  Blood Culture ID Panel (Reflexed)     Status: Abnormal   Collection Time: 12/04/15  7:20 PM  Result Value Ref Range Status   Enterococcus species NOT DETECTED NOT DETECTED Final   Listeria monocytogenes NOT DETECTED NOT DETECTED Final   Staphylococcus species DETECTED (A) NOT DETECTED Final    Comment: CRITICAL RESULT CALLED TO, READ BACK BY AND VERIFIED WITH: P CLARK,RN AT 0086 12/05/15 BY L BENFIELD    Staphylococcus  aureus DETECTED (A) NOT DETECTED Final    Comment: CRITICAL RESULT CALLED TO, READ BACK BY AND VERIFIED WITH: P CLARK,RN AT 7619 12/05/15 BY L BENFIELD    Methicillin resistance NOT DETECTED NOT DETECTED Final   Streptococcus species NOT DETECTED NOT DETECTED Final   Streptococcus agalactiae NOT DETECTED NOT DETECTED Final   Streptococcus pneumoniae NOT DETECTED NOT DETECTED Final   Streptococcus pyogenes NOT DETECTED NOT DETECTED Final   Acinetobacter baumannii NOT DETECTED NOT DETECTED Final  Enterobacteriaceae species NOT DETECTED NOT DETECTED Final   Enterobacter cloacae complex NOT DETECTED NOT DETECTED Final   Escherichia coli NOT DETECTED NOT DETECTED Final   Klebsiella oxytoca NOT DETECTED NOT DETECTED Final   Klebsiella pneumoniae NOT DETECTED NOT DETECTED Final   Proteus species NOT DETECTED NOT DETECTED Final   Serratia marcescens NOT DETECTED NOT DETECTED Final   Haemophilus influenzae NOT DETECTED NOT DETECTED Final   Neisseria meningitidis NOT DETECTED NOT DETECTED Final   Pseudomonas aeruginosa NOT DETECTED NOT DETECTED Final   Candida albicans NOT DETECTED NOT DETECTED Final   Candida glabrata NOT DETECTED NOT DETECTED Final   Candida krusei NOT DETECTED NOT DETECTED Final   Candida parapsilosis NOT DETECTED NOT DETECTED Final   Candida tropicalis NOT DETECTED NOT DETECTED Final    Comment: Performed at [facility]  Culture, blood (routine x 2)     Status: Abnormal   Collection Time: 12/04/15  8:40 PM  Result Value Ref Range Status   Specimen Description BLOOD RIGHT HAND  Final   Special Requests BOTTLES DRAWN AEROBIC AND ANAEROBIC 5CC  Final   Culture  Setup Time   Final    GRAM POSITIVE COCCI IN CLUSTERS IN BOTH AEROBIC AND ANAEROBIC BOTTLES CRITICAL RESULT CALLED TO, READ BACK BY AND VERIFIED WITH: P CLARK,RN AT 6734 12/05/15 BY L BENFIELD    Culture (A)  Final    STAPHYLOCOCCUS AUREUS SUSCEPTIBILITIES PERFORMED ON PREVIOUS CULTURE WITHIN THE LAST 5  DAYS. Performed at [facility]    Report Status 12/07/2015 FINAL  Final  MRSA PCR Screening     Status: None   Collection Time: 12/05/15  3:38 PM  Result Value Ref Range Status   MRSA by PCR NEGATIVE NEGATIVE Final    Comment:        The GeneXpert MRSA Assay (FDA approved for NASAL specimens only), is one component of a comprehensive MRSA colonization surveillance program. It is not intended to diagnose MRSA infection nor to guide or monitor treatment for MRSA infections.   Culture, blood (Routine X 2) w Reflex to ID Panel     Status: Abnormal   Collection Time: 12/07/15  5:30 PM  Result Value Ref Range Status   Specimen Description BLOOD RIGHT HAND  Final   Special Requests IN PEDIATRIC BOTTLE 1CC  Final   Culture  Setup Time   Final    GRAM POSITIVE COCCI IN CLUSTERS IN PEDIATRIC BOTTLE CRITICAL VALUE NOTED.  VALUE IS CONSISTENT WITH PREVIOUSLY REPORTED AND CALLED VALUE.    Culture (A)  Final    STAPHYLOCOCCUS AUREUS SUSCEPTIBILITIES PERFORMED ON PREVIOUS CULTURE WITHIN THE LAST 5 DAYS.    Report Status 12/10/2015 FINAL  Final  Culture, blood (Routine X 2) w Reflex to ID Panel     Status: Abnormal   Collection Time: 12/07/15  5:30 PM  Result Value Ref Range Status   Specimen Description BLOOD LEFT HAND  Final   Special Requests IN PEDIATRIC BOTTLE 2CC  Final   Culture  Setup Time   Final    GRAM POSITIVE COCCI IN CLUSTERS IN PEDIATRIC BOTTLE CRITICAL RESULT CALLED TO, READ BACK BY AND VERIFIED WITH: Leonie Green Pharm.D. 11:20 12/08/15 ([email or web address])    Culture STAPHYLOCOCCUS AUREUS (A)  Final   Report Status 12/10/2015 FINAL  Final   Organism ID, Bacteria STAPHYLOCOCCUS AUREUS  Final      Susceptibility   Staphylococcus aureus - MIC*    CIPROFLOXACIN <=0.5 SENSITIVE Sensitive     ERYTHROMYCIN >=8  RESISTANT Resistant     GENTAMICIN <=0.5 SENSITIVE Sensitive     OXACILLIN 0.5 SENSITIVE Sensitive     TETRACYCLINE <=1 SENSITIVE Sensitive     VANCOMYCIN <=0.5  SENSITIVE Sensitive     TRIMETH/SULFA <=10 SENSITIVE Sensitive     CLINDAMYCIN <=0.25 SENSITIVE Sensitive     RIFAMPIN <=0.5 SENSITIVE Sensitive     Inducible Clindamycin NEGATIVE Sensitive     * STAPHYLOCOCCUS AUREUS  Blood Culture ID Panel (Reflexed)     Status: Abnormal   Collection Time: 12/07/15  5:30 PM  Result Value Ref Range Status   Enterococcus species NOT DETECTED NOT DETECTED Final   Listeria monocytogenes NOT DETECTED NOT DETECTED Final   Staphylococcus species DETECTED (A) NOT DETECTED Final    Comment: CRITICAL RESULT CALLED TO, READ BACK BY AND VERIFIED WITH: MVonita Moss.D. 11:20 12/08/15 ([email or web address])    Staphylococcus aureus DETECTED (A) NOT DETECTED Final    Comment: CRITICAL RESULT CALLED TO, READ BACK BY AND VERIFIED WITH: M. Radford Pax Pharm.D. 11:20 12/08/15 ([email or web address])    Methicillin resistance NOT DETECTED NOT DETECTED Final   Streptococcus species NOT DETECTED NOT DETECTED Final   Streptococcus agalactiae NOT DETECTED NOT DETECTED Final   Streptococcus pneumoniae NOT DETECTED NOT DETECTED Final   Streptococcus pyogenes NOT DETECTED NOT DETECTED Final   Acinetobacter baumannii NOT DETECTED NOT DETECTED Final   Enterobacteriaceae species NOT DETECTED NOT DETECTED Final   Enterobacter cloacae complex NOT DETECTED NOT DETECTED Final   Escherichia coli NOT DETECTED NOT DETECTED Final   Klebsiella oxytoca NOT DETECTED NOT DETECTED Final   Klebsiella pneumoniae NOT DETECTED NOT DETECTED Final   Proteus species NOT DETECTED NOT DETECTED Final   Serratia marcescens NOT DETECTED NOT DETECTED Final   Haemophilus influenzae NOT DETECTED NOT DETECTED Final   Neisseria meningitidis NOT DETECTED NOT DETECTED Final   Pseudomonas aeruginosa NOT DETECTED NOT DETECTED Final   Candida albicans NOT DETECTED NOT DETECTED Final   Candida glabrata NOT DETECTED NOT DETECTED Final   Candida krusei NOT DETECTED NOT DETECTED Final   Candida parapsilosis NOT DETECTED NOT DETECTED Final    Candida tropicalis NOT DETECTED NOT DETECTED Final  Culture, blood (Routine X 2) w Reflex to ID Panel     Status: Abnormal   Collection Time: 12/09/15  9:40 AM  Result Value Ref Range Status   Specimen Description BLOOD LEFT HAND  Final   Special Requests IN PEDIATRIC BOTTLE 4CC  Final   Culture  Setup Time   Final    GRAM POSITIVE COCCI IN CLUSTERS AEROBIC BOTTLE ONLY CRITICAL RESULT CALLED TO, READ BACK BY AND VERIFIED WITH: M TURNER 12/10/15 @ 1027 M VESTAL    Culture STAPHYLOCOCCUS AUREUS (A)  Final   Report Status 12/13/2015 FINAL  Final   Organism ID, Bacteria STAPHYLOCOCCUS AUREUS  Final      Susceptibility   Staphylococcus aureus - MIC*    CIPROFLOXACIN <=0.5 SENSITIVE Sensitive     ERYTHROMYCIN >=8 RESISTANT Resistant     GENTAMICIN <=0.5 SENSITIVE Sensitive     OXACILLIN 0.5 SENSITIVE Sensitive     TETRACYCLINE <=1 SENSITIVE Sensitive     VANCOMYCIN 1 SENSITIVE Sensitive     TRIMETH/SULFA <=10 SENSITIVE Sensitive     CLINDAMYCIN <=0.25 SENSITIVE Sensitive     RIFAMPIN <=0.5 SENSITIVE Sensitive     Inducible Clindamycin NEGATIVE Sensitive     * STAPHYLOCOCCUS AUREUS  Culture, blood (Routine X 2) w Reflex to ID Panel     Status: None   Collection  Time: 12/09/15  9:55 AM  Result Value Ref Range Status   Specimen Description BLOOD RIGHT HAND  Final   Special Requests IN PEDIATRIC BOTTLE 1CC  Final   Culture NO GROWTH 5 DAYS  Final   Report Status 12/14/2015 FINAL  Final  Culture, blood (routine x 2)     Status: None   Collection Time: 12/10/15  4:17 PM  Result Value Ref Range Status   Specimen Description BLOOD RIGHT HAND  Final   Special Requests BOTTLES DRAWN AEROBIC ONLY 5CC  Final   Culture NO GROWTH 5 DAYS  Final   Report Status 12/15/2015 FINAL  Final  Culture, blood (routine x 2)     Status: None   Collection Time: 12/10/15  4:22 PM  Result Value Ref Range Status   Specimen Description BLOOD RIGHT ARM  Final   Special Requests BOTTLES DRAWN AEROBIC ONLY 5CC   Final   Culture NO GROWTH 5 DAYS  Final   Report Status 12/15/2015 FINAL  Final    Studies/Results: No results found.    Assessment/Plan:  INTERVAL HISTORY: TEE with LARGE vegetation on TWO leaflets of TV  SEEN BY CVTS AND CARDIOLOGY FORMALLY  PTS MOM STATES HIS TOP LIP SWELLED ON PCN AS A CHILD, PATIENT WILLING TO TRIAL OF CEPHALOSPORIN  He is tolerating the CEPH  Repeat blood cultures finally appear to be clear   Principal Problem:   Bacteremia Active Problems:   Benzodiazepine dependence (HCC)   IVDU (intravenous drug user)   Viral hepatitis B chronic (HCC)   Leukocytosis   Hyponatremia   Thrombocytopenia (HCC)   Chest pain   Staphylococcus aureus bacteremia   Septic pulmonary embolism (HCC)   Hypokalemia   Acute bacterial endocarditis   Tricuspid regurgitation    WINDEL KEZIAH is a 20 y.o. male with 20 y.o. 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 MSSA right-sided endocarditis:                                             [facility] Staphylococcus aureus bacteremia  Staphylococcus aureus bacteremia (SAB) is associated with a high rate of complications and mortality.  Specific aspects of clinical management are critical to optimizing the outcome of patients with SAB.  Therefore, the [facility] Antimicrobial Management [facility]) has initiated an intervention aimed at improving the management of SAB at [facility].  To do so, Infectious Diseases physicians are providing an evidence-based consult for the management of all patients with SAB.     Yes No Comments  Perform follow-up blood cultures (even if the patient is afebrile) to ensure clearance of bacteremia [x]   BLOOD CULTURES have been  PERSISTENTLY POSITIVE  Ones from 12/10/15 are NO GROWTH AND FINAL   Remove vascular catheter and obtain follow-up blood cultures after  the removal of the catheter []  []  DO NOT PLACE PICC LINE UNTIL and UNLESS HE COMMITS TO GOING TO SNF  Perform echocardiography to evaluate for endocarditis (transthoracic ECHO is 40-50% sensitive, TEE is > 90% sensitive) []  []  TEE with large vegetation on 2 leaflets of TV       Ensure source control []  []  Have all abscesses been drained effectively? Have deep seeded infections (septic joints or osteomyelitis) had appropriate surgical debridement?  Source seems to be his injection drug use   Investigate for "metastatic" sites of infection []  []  Does the patient have ANY symptom or physical exam finding that would suggest a deeper infection (back or neck pain that may be suggestive of vertebral osteomyelitis or epidural abscess, muscle pain that could be a symptom of pyomyositis)?  Keep in mind that for deep seeded infections MRI imaging with contrast is preferred rather than other often insensitive tests such as plain x-rays, especially early in a patient's presentation.   No clinical signs pointing to and others metastatic site of infection besides the lungs at this point   Trial of ANCEF 2G IV Q 8 HOURS [x]  []  Beta-lactam antibiotics are preferred for MSSA due to higher cure rates.   If on Vancomycin, goal trough should be 15 - 20 mcg/mL  Estimated duration of IV antibiotic therapy:  6 weeks but would need to be in a skilled nursing facility  [x]  []  Consult case management for probably prolonged outpatient IV antibiotic therapy    #2 Chronic hepatitis be without hepatic coma: can be addressed further as an outpatient  #3 Intravenous drug use: He was refractory with me and told me that he is got to the point where he just doesn't think he can stop using intravenous drugs. He says he has been clean for up to 4-5 months but he always relapses he feels that in his mind that there is no way that he will be able to stop. We had further discussions about the fact that his particular addiction is  particularly dangerous because of his injecting drugs or his skin which poses him at high risk for getting bloodstream infections and further infections of his heart valves and that if possible being on some other opiate replacement therapy that could satisfy his addiction or possible that this would pose a less of a risk to his health than injection drug use. Unfortunately the options appear limited.  I unfortunately do not have much faith that he will be able to refrain from intravenous drug use in the future  At least he is being honest with [place]  Options for him with re to dispo are  OPTION #1 :  IV antibiotics in SNF in which case plan is  Diagnosis: Right-sided endocarditis with septic emboli to the lungs  Culture Result: MSSA  Allergies  Allergen Reactions  . Penicillins Swelling    Per mom- when he was 6 or 7yo his bottom lip swelled  . Sulfa Antibiotics Swelling and Rash    Childhood allergic reaction    Discharge antibiotics: Ancef 2 g IV every 8 hours   Duration: 6 weeks  End Date:  November 14th  [facility] Care Per Protocol:  Labs weekly while on IV antibiotics: _x_ CBC with differential _x_ CMP _x_ CRP _x_ ESR   _x_ Please pull PIC at completion of IV antibiotics __ Please leave PIC in place until doctor has seen patient or been notified  Fax weekly labs to [phone]  Clinic Follow Up Appt:  2 weeks with ID pharmacy, 4 weeks with ID MD  OPTION # 2:  Home with oral zyvox 600 mg IV q 12 hours x 6 weeks same folllwup as above and would need CBC by week 2   I will arrange HSFU for him while he is making up his mind re going to SNF vs home.  I am available for further questions but will otherwise sign off.   LOS: 10  days   Alcide Evener 12/15/2015, 12:18 PM

## 2015-12-16 LAB — BASIC METABOLIC PANEL
Anion gap: 11 (ref 5–15)
BUN: 8 mg/dL (ref 6–20)
CALCIUM: 9.8 mg/dL (ref 8.9–10.3)
CO2: 26 mmol/L (ref 22–32)
CREATININE: 0.58 mg/dL — AB (ref 0.61–1.24)
Chloride: 100 mmol/L — ABNORMAL LOW (ref 101–111)
Glucose, Bld: 109 mg/dL — ABNORMAL HIGH (ref 65–99)
Potassium: 4.2 mmol/L (ref 3.5–5.1)
SODIUM: 137 mmol/L (ref 135–145)

## 2015-12-16 LAB — CBC
HCT: 42.2 % (ref 39.0–52.0)
Hemoglobin: 13.8 g/dL (ref 13.0–17.0)
MCH: 27.5 pg (ref 26.0–34.0)
MCHC: 32.7 g/dL (ref 30.0–36.0)
MCV: 84.2 fL (ref 78.0–100.0)
Platelets: 283 10*3/uL (ref 150–400)
RBC: 5.01 MIL/uL (ref 4.22–5.81)
RDW: 12.6 % (ref 11.5–15.5)
WBC: 11.7 10*3/uL — AB (ref 4.0–10.5)

## 2015-12-16 MED ORDER — LINEZOLID 600 MG PO TABS: 600.0000 mg | ORAL_TABLET | Freq: Two times a day (BID) | ORAL | 0 refills | Status: AC

## 2015-12-16 MED FILL — LINEZOLID 600 MG TABLET: 600 | 37 days supply | Qty: 74 | Fill #0

## 2015-12-16 NOTE — Progress Notes (Signed)
Lieutenant Diego to be D/C'd Home per MD order.  Discussed with the patient and all questions fully answered.  VSS, Skin clean, dry and intact without evidence of skin break down, no evidence of skin tears noted. IV catheter discontinued intact. Site without signs and symptoms of complications. Dressing and pressure applied.  An After Visit Summary was printed and given to the patient. Patient received prescription.  D/c education completed with patient/family including follow up instructions, medication list, d/c activities limitations if indicated, with other d/c instructions as indicated by MD - patient able to verbalize understanding, all questions fully answered.   Patient instructed to return to ED, call 911, or call MD for any changes in condition.   Patient to be escorted and discharged home    [place], [place] 12/16/2015 3:06 PM

## 2015-12-16 NOTE — Discharge Summary (Signed)
Physician Discharge Summary  Erik [name] [id number] DOB: 1996/02/04 DOA: 12/05/2015  PCP: Gwendolyn Grant, MD  Admit date: 12/05/2015 Discharge date: 12/16/2015  Admitted From: Home Disposition:  Home  Recommendations for Outpatient Follow-up:  1. Follow up with infectious disease in 2 weeks 2. Recheck CBC for thrombocytopenia since on Zyvox 3. Ongoing counseling for drug abuse and addiction 4. Follow-up with dentist for poor dentition   Home Health: None Equipment/Devices: None  Discharge Condition: Guarded CODE STATUS: Full code  Brief/Interim Summary:  HPI written by Dyanne Carrel, NP and attested by Dr. Wolfgang Phoenix is a 20 y.o. male with medical history significant  for IV drug abuse, endocarditis, hepatitis B rhabdomyolysis, non-STEMI presents to [facility] room 36 after being informed of positive blood cultures taken yesterday.  Information is obtained from the patient. He went to the emergency department yesterday with complaints of fever chest pain. He was evaluated and discharged home on Levaquin for pneumonia. Blood cultures were drawn at that time positive for staph aureus. He reports left anterior chest pain worse with inspiration. The pain is sharp constant nonradiating. Denies diaphoresis nausea vomiting. He denies headache visual changes dizziness syncope or near-syncope. He denies fever chills dysuria hematuria frequency or urgency. 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 Infectious disease, cardiology and cardiothoracic surgery consulted. Blood cultures grew MSSA. Patient initially started on Vancomycin. Initial echo on 10/1 was significant for large tricuspid valve vegetation seen on 2 leaflets. Vegetations confirmed by TEE on 10/3. Cardiothoracic surgery evaluated and decided patient currently  does not meet criteria for surgery as he plans to continue to use IV drugs. Vancomycin was eventually switched to Cefazolin by infectious disease after penicillin allergy was clarified by mother. Repeat blood culture on 10/5 eventually was no growth x5 days. Initial plan was for 6 weeks total of IV cefazolin, however, patient would not agree to SNF discharge. High risk for abuse of PICC line if sent home with PICC. Risks and benefits were discussed with the patient and he understood the effect of receiving sub optimal treatment especially with his endocarditis. Patient was sent home with Zyvox regimen to continue 6 weeks of treatment from the date of last negative culture.  Pulmonary opacities Septic emboli Secondary to MSSA bacteremia. Patient is symptomatic with pleuritic chest pain. norco and diclofenac prn  Hyponatremia Resolved with fluids  Thrombocytopenia Likely related to chronic hepatitis B. Resolved  Chronic hepatitis B infection without coma No acute symptoms. Hepatitis C negative  Poor dentition Orthopantogram shows no fractures.  Chest pain Patient has a recent heart catheterization significant for normal coronary arteries. Troponin was borderline initially and has decreased. Likely secondary to pleuritic chest pain from embolic septic disease.  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 Currently at [facility]. Thanks. Marthenia Rolling, MSN-Ed,  RN,BSN-THN  [facility] Liaison-[phone]   Reason for consult:  Please assign UMR member for post toc   Expected date of contact:  1-3 days (reserved for hospital discharges)       Medication List    STOP taking these medications   levofloxacin 750 MG tablet Commonly known as:  LEVAQUIN     TAKE these medications   linezolid 600 MG tablet Commonly known as:  ZYVOX Take 1 tablet (600 mg total) by mouth 2 (two) times daily.      Follow-up [facility], MD. Schedule an appointment as soon as possible for a visit in 2 week(s).   Specialty:  Infectious Diseases Why:  Lab work Contact information: [address] [place] [address] [phone]        Gwendolyn Grant, MD. Schedule an appointment as soon as possible for a visit in 1 week(s).   Specialty:  Internal Medicine Contact information: [address] [address] [phone]          Allergies  Allergen Reactions  . Penicillins Swelling    Per mom- when he was 6 or 7yo his bottom lip swelled  . Sulfa Antibiotics Swelling and Rash    Childhood allergic reaction    Consultations:  Infectious disease  Cardiology  Cardiothoracic surgery   Procedures/Studies: Dg Orthopantogram  Result Date: 12/10/2015 CLINICAL DATA:  Bacteremia.  Jaw pain. EXAM: ORTHOPANTOGRAM/PANORAMIC COMPARISON:  None. FINDINGS: There is no evidence of fracture or other bony abnormality. IMPRESSION: No definite abnormality involving the mandible. Electronically Signed   By: Marijo Conception, M.D.   On: 12/10/2015 07:37   Dg Chest 2 View  Result Date: 12/04/2015 CLINICAL DATA:  Left-sided chest pain, shortness of breath, former smoking history EXAM: CHEST  2 VIEW COMPARISON:  Chest x-ray of 02/22/2015 FINDINGS: There are vague parenchymal opacities scattered throughout the right lung. In this young patient an infectious or inflammatory process would seem most likely. No  definite opacity is seen throughout the left lung. There are somewhat prominent perihilar markings better seen on the lateral view which may indicate bronchitis. Mediastinal and hilar contours are unremarkable with no evidence of hilar or adenopathy. The heart is within normal limits in size. No bony abnormality is seen. IMPRESSION: Patchy parenchymal opacities throughout the right lung most likely inflammatory or infectious in etiology. Recommend followup. No adenopathy. Also question bronchitis. Electronically Signed   By: Ivar Drape M.D.   On: 12/04/2015 14:29   Ct Angio Chest Pe W Or Wo Contrast  Result Date: 12/06/2015 CLINICAL DATA:  Left-sided chest pain for 2-3 days. Staph aureus bacteremia with chest pain. Clinical suspicion of septic emboli. Additional history of drug abuse/dependence, IV drug abuser, history of endocarditis, hepatitis B. EXAM: CT ANGIOGRAPHY CHEST WITH CONTRAST TECHNIQUE: Multidetector CT imaging of the chest was performed using the standard protocol during bolus administration of intravenous contrast. Multiplanar CT image reconstructions and MIPs were obtained to evaluate the vascular anatomy. CONTRAST:  100 cc Isovue 370 COMPARISON:  Chest x-ray dated 12/04/2015. FINDINGS: Cardiovascular: The majority of the most peripheral segmental and subsegmental pulmonary arteries cannot be definitively characterized due to patient breathing motion artifact but there is no pulmonary embolism identified within the main, lobar or central segmental pulmonary arteries bilaterally. Heart size is normal. No pericardial effusion. Thoracic aorta is normal. No aortic aneurysm or dissection. Mediastinum/Nodes: Scattered small lymph nodes within the mediastinum, perihilar and axillary regions, likely  reactive in nature. Lungs/Pleura: Patchy nodular consolidations are seen throughout the periphery of both lungs, largest in the right lower lobe measuring 2.3 cm greatest dimension, including a cavitary  consolidation at the right lung apex laterally which measures 1 9 cm and an additional cavitary consolidation in the right lung apex anteriorly which measures 1.2 cm. Larger denser consolidation at the left lung base is likely atelectasis, less likely pneumonia. Additional patchy atelectasis noted at the right lung base. Small left pleural effusion. No pneumothorax. Upper Abdomen: Limited images of the upper abdomen are unremarkable. Musculoskeletal: No chest wall abnormality. No acute or significant osseous findings. Review of the MIP images confirms the above findings. IMPRESSION: 1. Numerous nodular consolidations throughout the periphery of both lungs, some of which have central cavitations, largest in the right lower lobe measuring 2.3 cm greatest dimension. Differential includes atypical infection (such as fungal pneumonia) and septic emboli. Favor septic emboli. 2. Additional denser consolidation at the left lung base is most likely atelectasis, less likely pneumonia. 3. Small left pleural effusion. 4. No pulmonary embolism, with mild study limitations detailed above. 5. Heart size is upper normal.  No pericardial effusion. Electronically Signed   By: Franki Cabot M.D.   On: 12/06/2015 18:51     Echocardiogram (10/1)  Study Conclusions  - Left ventricle: The cavity size was normal. Wall thickness was   normal. Systolic function was normal. The estimated ejection   fraction was in the range of 50% to 55%. Wall motion was normal;   there were no regional wall motion abnormalities. - Tricuspid valve: Irregular echodensity noted on atrial side of   tricuspid valve consistent with vegetation and clinical diagnosis   of endocarditis given history of bacteremia and IV drug use.   Longest dimension measures nearly 2 cm. There was moderate   regurgitation. - Pulmonary arteries: PA peak pressure: 25 mm Hg (S). - Pericardium, extracardiac: There was no pericardial effusion.  Impressions:  -  Normal LV wall thickness with LVEF 50-55%. Normal diastolic   function. Irregular echodensity noted on atrial side of tricuspid   valve consistent with vegetation and clinical diagnosis of   endocarditis given history of bacteremia and IV drug use. Longest   dimension measures nearly 2 cm. Moderate tricuspid regurgitation   noted with PASP estimated 25 mmHg.    TEE (10/3)  Left Ventrical:  Normal LV   Mitral Valve: normal   Aortic Valve: normal  Tricuspid Valve: large vegatation involving 2 of the leaflets.   At leads moderate TR  Pulmonic Valve: normal   Left Atrium/ Left atrial appendage: no thrombus   Atrial septum: no PFO by color doppler   Aorta: normal     Echocardiogram (10/8)  Study Conclusions  - Left ventricle: The cavity size was normal. Systolic function was   normal. The estimated ejection fraction was in the range of 50%   to 55%. Wall motion was normal; there were no regional wall   motion abnormalities. Left ventricular diastolic function   parameters were normal. - Aortic valve: Trileaflet; normal thickness leaflets. - Aortic root: The aortic root was normal in size. - Mitral valve: Structurally normal valve. There was no   regurgitation. - Tricuspid valve: There was severe regurgitation. - Pulmonic valve: Structurally normal valve. - Inferior vena cava: The vessel was normal in size.  Impressions:  - When compared to the prior studies from 10/3 and 12/07/2015 there   is no significant change.   There is a large vegetation  attached to the tricuspid valve   measuring 27 x 6 mm associated with severe tricuspid   regurgitation.   No vegetations were seen on the aortic, mitral or pulmonic   valves.   RVSP is normal.   Subjective: Patient reports no complaints overnight. Ready to go home. Does not want SNF discharge  Discharge Exam: Vitals:   12/15/15 2115 12/16/15 0410  BP: (!) 98/49 106/69  Pulse: 69 (!) 58  Resp: 18 20  Temp:  98.1 F (36.7 C) 98 F (36.7 C)   Vitals:   12/15/15 0553 12/15/15 1426 12/15/15 2115 12/16/15 0410  BP: 100/64 (!) 116/51 (!) 98/49 106/69  Pulse: 77 71 69 (!) 58  Resp: 16 17 18 20   Temp: 99.9 F (37.7 C) 98.2 F (36.8 C) 98.1 F (36.7 C) 98 F (36.7 C)  TempSrc: Oral Oral Oral   SpO2: 100% 100% 100% 100%  Weight:      Height:        General exam: Appears calm and comfortable. sitting up in bed Respiratory system: Clear to auscultation. Respiratory effort normal. Cardiovascular system: S1 & S2 heard, RRR. 2/6 systolic murmur Gastrointestinal system: Abdomen is nondistended, soft and nontender. No organomegaly or masses felt. Normal bowel sounds heard. Central nervous system: Alert and oriented. No focal neurological deficits. Extremities: No edema. No calf tenderness Skin: No cyanosis. No rashes. No petechiae Psychiatry: Judgement and insight appear normal. Mood & affect appropriate.    The results of significant diagnostics from this hospitalization (including imaging, microbiology, ancillary and laboratory) are listed below for reference.     Microbiology: Recent Results (from the past 240 hour(s))  Culture, blood (Routine X 2) w Reflex to ID Panel     Status: Abnormal   Collection Time: 12/07/15  5:30 PM  Result Value Ref Range Status   Specimen Description BLOOD RIGHT HAND  Final   Special Requests IN PEDIATRIC BOTTLE 1CC  Final   Culture  Setup Time   Final    GRAM POSITIVE COCCI IN CLUSTERS IN PEDIATRIC BOTTLE CRITICAL VALUE NOTED.  VALUE IS CONSISTENT WITH PREVIOUSLY REPORTED AND CALLED VALUE.    Culture (A)  Final    STAPHYLOCOCCUS AUREUS SUSCEPTIBILITIES PERFORMED ON PREVIOUS CULTURE WITHIN THE LAST 5 DAYS.    Report Status 12/10/2015 FINAL  Final  Culture, blood (Routine X 2) w Reflex to ID Panel     Status: Abnormal   Collection Time: 12/07/15  5:30 PM  Result Value Ref Range Status   Specimen Description BLOOD LEFT HAND  Final   Special Requests IN  PEDIATRIC BOTTLE 2CC  Final   Culture  Setup Time   Final    GRAM POSITIVE COCCI IN CLUSTERS IN PEDIATRIC BOTTLE CRITICAL RESULT CALLED TO, READ BACK BY AND VERIFIED WITH: Leonie Green Pharm.D. 11:20 12/08/15 ([email or web address])    Culture STAPHYLOCOCCUS AUREUS (A)  Final   Report Status 12/10/2015 FINAL  Final   Organism ID, Bacteria STAPHYLOCOCCUS AUREUS  Final      Susceptibility   Staphylococcus aureus - MIC*    CIPROFLOXACIN <=0.5 SENSITIVE Sensitive     ERYTHROMYCIN >=8 RESISTANT Resistant     GENTAMICIN <=0.5 SENSITIVE Sensitive     OXACILLIN 0.5 SENSITIVE Sensitive     TETRACYCLINE <=1 SENSITIVE Sensitive     VANCOMYCIN <=0.5 SENSITIVE Sensitive     TRIMETH/SULFA <=10 SENSITIVE Sensitive     CLINDAMYCIN <=0.25 SENSITIVE Sensitive     RIFAMPIN <=0.5 SENSITIVE Sensitive     Inducible Clindamycin NEGATIVE Sensitive     *  STAPHYLOCOCCUS AUREUS  Blood Culture ID Panel (Reflexed)     Status: Abnormal   Collection Time: 12/07/15  5:30 PM  Result Value Ref Range Status   Enterococcus species NOT DETECTED NOT DETECTED Final   Listeria monocytogenes NOT DETECTED NOT DETECTED Final   Staphylococcus species DETECTED (A) NOT DETECTED Final    Comment: CRITICAL RESULT CALLED TO, READ BACK BY AND VERIFIED WITH: M. Radford Pax Pharm.D. 11:20 12/08/15 ([email or web address])    Staphylococcus aureus DETECTED (A) NOT DETECTED Final    Comment: CRITICAL RESULT CALLED TO, READ BACK BY AND VERIFIED WITH: M. Radford Pax Pharm.D. 11:20 12/08/15 ([email or web address])    Methicillin resistance NOT DETECTED NOT DETECTED Final   Streptococcus species NOT DETECTED NOT DETECTED Final   Streptococcus agalactiae NOT DETECTED NOT DETECTED Final   Streptococcus pneumoniae NOT DETECTED NOT DETECTED Final   Streptococcus pyogenes NOT DETECTED NOT DETECTED Final   Acinetobacter baumannii NOT DETECTED NOT DETECTED Final   Enterobacteriaceae species NOT DETECTED NOT DETECTED Final   Enterobacter cloacae complex NOT DETECTED NOT DETECTED Final    Escherichia coli NOT DETECTED NOT DETECTED Final   Klebsiella oxytoca NOT DETECTED NOT DETECTED Final   Klebsiella pneumoniae NOT DETECTED NOT DETECTED Final   Proteus species NOT DETECTED NOT DETECTED Final   Serratia marcescens NOT DETECTED NOT DETECTED Final   Haemophilus influenzae NOT DETECTED NOT DETECTED Final   Neisseria meningitidis NOT DETECTED NOT DETECTED Final   Pseudomonas aeruginosa NOT DETECTED NOT DETECTED Final   Candida albicans NOT DETECTED NOT DETECTED Final   Candida glabrata NOT DETECTED NOT DETECTED Final   Candida krusei NOT DETECTED NOT DETECTED Final   Candida parapsilosis NOT DETECTED NOT DETECTED Final   Candida tropicalis NOT DETECTED NOT DETECTED Final  Culture, blood (Routine X 2) w Reflex to ID Panel     Status: Abnormal   Collection Time: 12/09/15  9:40 AM  Result Value Ref Range Status   Specimen Description BLOOD LEFT HAND  Final   Special Requests IN PEDIATRIC BOTTLE 4CC  Final   Culture  Setup Time   Final    GRAM POSITIVE COCCI IN CLUSTERS AEROBIC BOTTLE ONLY CRITICAL RESULT CALLED TO, READ BACK BY AND VERIFIED WITH: M TURNER 12/10/15 @ 1027 M VESTAL    Culture STAPHYLOCOCCUS AUREUS (A)  Final   Report Status 12/13/2015 FINAL  Final   Organism ID, Bacteria STAPHYLOCOCCUS AUREUS  Final      Susceptibility   Staphylococcus aureus - MIC*    CIPROFLOXACIN <=0.5 SENSITIVE Sensitive     ERYTHROMYCIN >=8 RESISTANT Resistant     GENTAMICIN <=0.5 SENSITIVE Sensitive     OXACILLIN 0.5 SENSITIVE Sensitive     TETRACYCLINE <=1 SENSITIVE Sensitive     VANCOMYCIN 1 SENSITIVE Sensitive     TRIMETH/SULFA <=10 SENSITIVE Sensitive     CLINDAMYCIN <=0.25 SENSITIVE Sensitive     RIFAMPIN <=0.5 SENSITIVE Sensitive     Inducible Clindamycin NEGATIVE Sensitive     * STAPHYLOCOCCUS AUREUS  Culture, blood (Routine X 2) w Reflex to ID Panel     Status: None   Collection Time: 12/09/15  9:55 AM  Result Value Ref Range Status   Specimen Description BLOOD RIGHT HAND   Final   Special Requests IN PEDIATRIC BOTTLE 1CC  Final   Culture NO GROWTH 5 DAYS  Final   Report Status 12/14/2015 FINAL  Final  Culture, blood (routine x 2)     Status: None   Collection Time: 12/10/15  4:17 PM  Result Value Ref  Range Status   Specimen Description BLOOD RIGHT HAND  Final   Special Requests BOTTLES DRAWN AEROBIC ONLY 5CC  Final   Culture NO GROWTH 5 DAYS  Final   Report Status 12/15/2015 FINAL  Final  Culture, blood (routine x 2)     Status: None   Collection Time: 12/10/15  4:22 PM  Result Value Ref Range Status   Specimen Description BLOOD RIGHT ARM  Final   Special Requests BOTTLES DRAWN AEROBIC ONLY 5CC  Final   Culture NO GROWTH 5 DAYS  Final   Report Status 12/15/2015 FINAL  Final     Labs: BNP (last 3 results) No results for input(s): BNP in the last 8760 hours. Basic Metabolic Panel:  Recent Labs Lab 12/12/15 0635 12/14/15 0529 12/16/15 0631  NA 138 136 137  K 4.6 4.3 4.2  CL 100* 103 100*  CO2 27 26 26   GLUCOSE 98 108* 109*  BUN 9 10 8   CREATININE 0.64 0.58* 0.58*  CALCIUM 9.2 9.1 9.8   Liver Function Tests: No results for input(s): AST, ALT, ALKPHOS, BILITOT, PROT, ALBUMIN in the last 168 hours. No results for input(s): LIPASE, AMYLASE in the last 168 hours. No results for input(s): AMMONIA in the last 168 hours. CBC:  Recent Labs Lab 12/10/15 0457 12/12/15 0635 12/14/15 0529 12/16/15 0631  WBC 13.2* 14.4* 9.2 11.7*  NEUTROABS  --  11.7* 5.5  --   HGB 11.9* 12.9* 12.2* 13.8  HCT 36.3* 39.1 37.4* 42.2  MCV 84.0 84.3 84.8 84.2  PLT 239 277 259 283   Cardiac Enzymes: No results for input(s): CKTOTAL, CKMB, CKMBINDEX, TROPONINI in the last 168 hours. BNP: Invalid input(s): POCBNP CBG: No results for input(s): GLUCAP in the last 168 hours. D-Dimer No results for input(s): DDIMER in the last 72 hours. Hgb A1c No results for input(s): HGBA1C in the last 72 hours. Lipid Profile No results for input(s): CHOL, HDL, LDLCALC,  TRIG, CHOLHDL, LDLDIRECT in the last 72 hours. Thyroid function studies No results for input(s): TSH, T4TOTAL, T3FREE, THYROIDAB in the last 72 hours.  Invalid input(s): FREET3 Anemia work up No results for input(s): VITAMINB12, FOLATE, FERRITIN, TIBC, IRON, RETICCTPCT in the last 72 hours. Urinalysis    Component Value Date/Time   COLORURINE YELLOW 12/05/2015 2053   APPEARANCEUR CLOUDY (A) 12/05/2015 2053   APPEARANCEUR Clear 02/27/2014 0351   LABSPEC 1.019 12/05/2015 2053   LABSPEC 1.025 02/27/2014 0351   PHURINE 6.5 12/05/2015 2053   GLUCOSEU NEGATIVE 12/05/2015 2053   GLUCOSEU Negative 02/27/2014 0351   HGBUR NEGATIVE 12/05/2015 2053   BILIRUBINUR NEGATIVE 12/05/2015 2053   BILIRUBINUR Negative 02/27/2014 0351   KETONESUR NEGATIVE 12/05/2015 2053   PROTEINUR NEGATIVE 12/05/2015 2053   UROBILINOGEN 0.2 11/12/2014 2124   NITRITE NEGATIVE 12/05/2015 2053   LEUKOCYTESUR NEGATIVE 12/05/2015 2053   LEUKOCYTESUR Negative 02/27/2014 0351   Sepsis Labs Invalid input(s): PROCALCITONIN,  WBC,  LACTICIDVEN Microbiology Recent Results (from the past 240 hour(s))  Culture, blood (Routine X 2) w Reflex to ID Panel     Status: Abnormal   Collection Time: 12/07/15  5:30 PM  Result Value Ref Range Status   Specimen Description BLOOD RIGHT HAND  Final   Special Requests IN PEDIATRIC BOTTLE 1CC  Final   Culture  Setup Time   Final    GRAM POSITIVE COCCI IN CLUSTERS IN PEDIATRIC BOTTLE CRITICAL VALUE NOTED.  VALUE IS CONSISTENT WITH PREVIOUSLY REPORTED AND CALLED VALUE.    Culture (A)  Final  STAPHYLOCOCCUS AUREUS SUSCEPTIBILITIES PERFORMED ON PREVIOUS CULTURE WITHIN THE LAST 5 DAYS.    Report Status 12/10/2015 FINAL  Final  Culture, blood (Routine X 2) w Reflex to ID Panel     Status: Abnormal   Collection Time: 12/07/15  5:30 PM  Result Value Ref Range Status   Specimen Description BLOOD LEFT HAND  Final   Special Requests IN PEDIATRIC BOTTLE 2CC  Final   Culture  Setup Time    Final    GRAM POSITIVE COCCI IN CLUSTERS IN PEDIATRIC BOTTLE CRITICAL RESULT CALLED TO, READ BACK BY AND VERIFIED WITH: Leonie Green Pharm.D. 11:20 12/08/15 ([email or web address])    Culture STAPHYLOCOCCUS AUREUS (A)  Final   Report Status 12/10/2015 FINAL  Final   Organism ID, Bacteria STAPHYLOCOCCUS AUREUS  Final      Susceptibility   Staphylococcus aureus - MIC*    CIPROFLOXACIN <=0.5 SENSITIVE Sensitive     ERYTHROMYCIN >=8 RESISTANT Resistant     GENTAMICIN <=0.5 SENSITIVE Sensitive     OXACILLIN 0.5 SENSITIVE Sensitive     TETRACYCLINE <=1 SENSITIVE Sensitive     VANCOMYCIN <=0.5 SENSITIVE Sensitive     TRIMETH/SULFA <=10 SENSITIVE Sensitive     CLINDAMYCIN <=0.25 SENSITIVE Sensitive     RIFAMPIN <=0.5 SENSITIVE Sensitive     Inducible Clindamycin NEGATIVE Sensitive     * STAPHYLOCOCCUS AUREUS  Blood Culture ID Panel (Reflexed)     Status: Abnormal   Collection Time: 12/07/15  5:30 PM  Result Value Ref Range Status   Enterococcus species NOT DETECTED NOT DETECTED Final   Listeria monocytogenes NOT DETECTED NOT DETECTED Final   Staphylococcus species DETECTED (A) NOT DETECTED Final    Comment: CRITICAL RESULT CALLED TO, READ BACK BY AND VERIFIED WITH: MRadford Pax Pharm.D. 11:20 12/08/15 ([email or web address])    Staphylococcus aureus DETECTED (A) NOT DETECTED Final    Comment: CRITICAL RESULT CALLED TO, READ BACK BY AND VERIFIED WITH: M. Radford Pax Pharm.D. 11:20 12/08/15 ([email or web address])    Methicillin resistance NOT DETECTED NOT DETECTED Final   Streptococcus species NOT DETECTED NOT DETECTED Final   Streptococcus agalactiae NOT DETECTED NOT DETECTED Final   Streptococcus pneumoniae NOT DETECTED NOT DETECTED Final   Streptococcus pyogenes NOT DETECTED NOT DETECTED Final   Acinetobacter baumannii NOT DETECTED NOT DETECTED Final   Enterobacteriaceae species NOT DETECTED NOT DETECTED Final   Enterobacter cloacae complex NOT DETECTED NOT DETECTED Final   Escherichia coli NOT DETECTED NOT DETECTED Final   Klebsiella  oxytoca NOT DETECTED NOT DETECTED Final   Klebsiella pneumoniae NOT DETECTED NOT DETECTED Final   Proteus species NOT DETECTED NOT DETECTED Final   Serratia marcescens NOT DETECTED NOT DETECTED Final   Haemophilus influenzae NOT DETECTED NOT DETECTED Final   Neisseria meningitidis NOT DETECTED NOT DETECTED Final   Pseudomonas aeruginosa NOT DETECTED NOT DETECTED Final   Candida albicans NOT DETECTED NOT DETECTED Final   Candida glabrata NOT DETECTED NOT DETECTED Final   Candida krusei NOT DETECTED NOT DETECTED Final   Candida parapsilosis NOT DETECTED NOT DETECTED Final   Candida tropicalis NOT DETECTED NOT DETECTED Final  Culture, blood (Routine X 2) w Reflex to ID Panel     Status: Abnormal   Collection Time: 12/09/15  9:40 AM  Result Value Ref Range Status   Specimen Description BLOOD LEFT HAND  Final   Special Requests IN PEDIATRIC BOTTLE 4CC  Final   Culture  Setup Time   Final    GRAM POSITIVE COCCI IN CLUSTERS AEROBIC BOTTLE ONLY CRITICAL RESULT CALLED  TO, READ BACK BY AND VERIFIED WITH: M TURNER 12/10/15 @ 1027 M VESTAL    Culture STAPHYLOCOCCUS AUREUS (A)  Final   Report Status 12/13/2015 FINAL  Final   Organism ID, Bacteria STAPHYLOCOCCUS AUREUS  Final      Susceptibility   Staphylococcus aureus - MIC*    CIPROFLOXACIN <=0.5 SENSITIVE Sensitive     ERYTHROMYCIN >=8 RESISTANT Resistant     GENTAMICIN <=0.5 SENSITIVE Sensitive     OXACILLIN 0.5 SENSITIVE Sensitive     TETRACYCLINE <=1 SENSITIVE Sensitive     VANCOMYCIN 1 SENSITIVE Sensitive     TRIMETH/SULFA <=10 SENSITIVE Sensitive     CLINDAMYCIN <=0.25 SENSITIVE Sensitive     RIFAMPIN <=0.5 SENSITIVE Sensitive     Inducible Clindamycin NEGATIVE Sensitive     * STAPHYLOCOCCUS AUREUS  Culture, blood (Routine X 2) w Reflex to ID Panel     Status: None   Collection Time: 12/09/15  9:55 AM  Result Value Ref Range Status   Specimen Description BLOOD RIGHT HAND  Final   Special Requests IN PEDIATRIC BOTTLE 1CC  Final    Culture NO GROWTH 5 DAYS  Final   Report Status 12/14/2015 FINAL  Final  Culture, blood (routine x 2)     Status: None   Collection Time: 12/10/15  4:17 PM  Result Value Ref Range Status   Specimen Description BLOOD RIGHT HAND  Final   Special Requests BOTTLES DRAWN AEROBIC ONLY 5CC  Final   Culture NO GROWTH 5 DAYS  Final   Report Status 12/15/2015 FINAL  Final  Culture, blood (routine x 2)     Status: None   Collection Time: 12/10/15  4:22 PM  Result Value Ref Range Status   Specimen Description BLOOD RIGHT ARM  Final   Special Requests BOTTLES DRAWN AEROBIC ONLY 5CC  Final   Culture NO GROWTH 5 DAYS  Final   Report Status 12/15/2015 FINAL  Final     Time coordinating discharge: Over 30 minutes  SIGNED:   Cordelia Poche, MD [facility] 12/16/2015, 2:06 PM Pager [phone]  If 7PM-7AM, please contact night-coverage [email or web address] Password [email or web address]

## 2015-12-16 NOTE — Consult Note (Signed)
   [facility] CM Inpatient Consult   12/16/2015  Erik [name] [birth year] [id number]    Spoke with inpatient RNCM. Writer spoke with patient at bedside recently. Made aware that referral has already been made for Telephonic RNCM to contact post discharge. Inpatient RNCM indicates patient is discharging home today and is refusing SNF placement. [name] will receive post discharge call on behalf of [facility] Care Management program for [place] employees/dependents with [facility] insurance.   Marthenia Rolling, MSN-Ed, RN,BSN [facility] Liaison [phone]

## 2015-12-16 NOTE — Care Management Note (Signed)
Case Management Note  Patient Details  Name: Erik [name] MRN: [id number] Date of Birth: [birth year]  Subjective/Objective:            Admitted with bacteremia, septic emboli       PCP: Gwendolyn Grant  Action/Plan: Plan is to d/c pt to home today. Information on outpatient substance abuse programs given to pt per CM/SW.  Expected Discharge Date:   12/16/2015           Expected Discharge Plan:  [place], pt refusing SNF.   In-House Referral:  Clinical Social Work  Ship broker  CM Consult  Post Acute Care Choice:    Choice offered to:     DME Arranged:    DME Agency:     HH Arranged:    HH Agency:     Status of Service:  COMPLETED  If discussed at [employer], dates discussed:    Additional Comments:  Sharin Mons, RN 12/16/2015, 11:23 AM

## 2015-12-16 NOTE — Progress Notes (Signed)
CM made aware by pt's nurse pt's refusal to be d/c to SNF. CM spoke with pt regarding refusal and pt stated he wants to go home. CM paged ID MD to make aware.  Benefits check in process for Zyvox 600MG  q 12hrs x 6 weeks. CM to f/u with results and disposition needs. Whitman Hero RN,BSN,CM  Benefits check: ZYVOX Please check zyvox 600 mg po q 12 hours x 6 weeks, copay and prior authorization if needed. Thanks    RESULTS:Pt copay will be $12 per month at [facility]. Whitman Hero RN,BSN,CM

## 2015-12-16 NOTE — Progress Notes (Signed)
Patient refusing PICC line and reiterated to nurse that he knows that it is suboptimal to go home with oral medication. Patient stating "I do not care, and want to go home". Dr. Lonny Prude made aware.

## 2015-12-18 ENCOUNTER — Other Ambulatory Visit: Payer: Self-pay | Admitting: *Deleted

## 2015-12-18 ENCOUNTER — Encounter: Payer: Self-pay | Admitting: *Deleted

## 2015-12-18 NOTE — Patient Outreach (Addendum)
[facility]) Care Management  12/18/2015  Erik [name] 07/08/1995 [id number]   Subjective: Telephone call to patient's home number, no answer, no voicemail set up, and unable to leave a message.  Telephone call from patient and HIPAA verified.   Discussed [facility] Care Management [facility] Transition of care follow up.   Patient states he is doing well and in agreement to transition of care follow up.   States has discharge instructions,  has not read them, and states he will read over it later.   RNCM advised of hospital follow up appointment with Dr. Megan Salon on 01/14/16, need to call Dr. Tommy Medal for follow up appointment and schedule lab work.   Patient voices understanding and states he will call to schedule follow up appointments with primary MD, Dr. Tommy Medal and lab work.   He will call RNCM if assistance needed in the future.   States he utilizes [facility] outpatient pharmacy for his medications.   Is not sure if he is covered under family member's supplemental insurance or they have it, and he will mention it to family member.   Patient states he does not have any transition of care, care coordination, disease management, disease monitoring, transportation, community resource, or pharmacy needs at this time.  He states he was very appreciative of the follow up call and is in agreement to receiving successful outreach letter.  States he has the Link to [employer].   Objective: Per chart review: Patient hospitalized  12/05/15 - 12/16/15 for endocarditis and bacteremia.   Patient also has a history of: Benzodiazepine dependence,  IVDU (intravenous drug user), Viral hepatitis B chronic, Leukocytosis, Hyponatremia, Thrombocytopenia, Chest pain, Staphylococcus aureus bacteremia, Septic pulmonary embolism, Hypokalemia Acute bacterial endocarditis,  and Tricuspid regurgitation.      Assessment:  Received [facility]MR Transition of care referral on 12/09/15.  Transition of care follow up completed, no  care management needs, and will proceed with case closure.   Plan:  RNCM will send patient successful outreach letter. RNCM will send case closure due to follow up completed / no care management needs to [name] [place] Management.    Erik [name] H. Annia Friendly, BSN, [place] Management [facility] Telephonic CM Phone: [phone] Fax: [phone]

## 2016-01-11 DIAGNOSIS — F1120 Opioid dependence, uncomplicated: Secondary | ICD-10-CM | POA: Diagnosis not present

## 2016-01-11 DIAGNOSIS — F1320 Sedative, hypnotic or anxiolytic dependence, uncomplicated: Secondary | ICD-10-CM | POA: Diagnosis not present

## 2016-01-14 ENCOUNTER — Inpatient Hospital Stay: Payer: Self-pay | Admitting: Internal Medicine

## 2016-01-23 DIAGNOSIS — F1320 Sedative, hypnotic or anxiolytic dependence, uncomplicated: Secondary | ICD-10-CM | POA: Diagnosis not present

## 2016-01-24 DIAGNOSIS — F1320 Sedative, hypnotic or anxiolytic dependence, uncomplicated: Secondary | ICD-10-CM | POA: Diagnosis not present

## 2016-01-25 DIAGNOSIS — F1320 Sedative, hypnotic or anxiolytic dependence, uncomplicated: Secondary | ICD-10-CM | POA: Diagnosis not present

## 2016-01-26 DIAGNOSIS — F1320 Sedative, hypnotic or anxiolytic dependence, uncomplicated: Secondary | ICD-10-CM | POA: Diagnosis not present

## 2016-01-27 DIAGNOSIS — F1320 Sedative, hypnotic or anxiolytic dependence, uncomplicated: Secondary | ICD-10-CM | POA: Diagnosis not present

## 2016-01-28 DIAGNOSIS — F1320 Sedative, hypnotic or anxiolytic dependence, uncomplicated: Secondary | ICD-10-CM | POA: Diagnosis not present

## 2016-01-29 DIAGNOSIS — F1320 Sedative, hypnotic or anxiolytic dependence, uncomplicated: Secondary | ICD-10-CM | POA: Diagnosis not present

## 2016-01-30 DIAGNOSIS — F1320 Sedative, hypnotic or anxiolytic dependence, uncomplicated: Secondary | ICD-10-CM | POA: Diagnosis not present

## 2016-01-31 DIAGNOSIS — F1320 Sedative, hypnotic or anxiolytic dependence, uncomplicated: Secondary | ICD-10-CM | POA: Diagnosis not present

## 2016-02-01 DIAGNOSIS — F1320 Sedative, hypnotic or anxiolytic dependence, uncomplicated: Secondary | ICD-10-CM | POA: Diagnosis not present

## 2016-02-02 DIAGNOSIS — F1320 Sedative, hypnotic or anxiolytic dependence, uncomplicated: Secondary | ICD-10-CM | POA: Diagnosis not present

## 2016-02-03 DIAGNOSIS — F1320 Sedative, hypnotic or anxiolytic dependence, uncomplicated: Secondary | ICD-10-CM | POA: Diagnosis not present

## 2016-02-04 DIAGNOSIS — F1320 Sedative, hypnotic or anxiolytic dependence, uncomplicated: Secondary | ICD-10-CM | POA: Diagnosis not present

## 2016-02-05 DIAGNOSIS — F1320 Sedative, hypnotic or anxiolytic dependence, uncomplicated: Secondary | ICD-10-CM | POA: Diagnosis not present

## 2016-02-10 DIAGNOSIS — F1320 Sedative, hypnotic or anxiolytic dependence, uncomplicated: Secondary | ICD-10-CM | POA: Diagnosis not present

## 2016-02-10 DIAGNOSIS — F1120 Opioid dependence, uncomplicated: Secondary | ICD-10-CM | POA: Diagnosis not present

## 2016-02-10 DIAGNOSIS — F1420 Cocaine dependence, uncomplicated: Secondary | ICD-10-CM | POA: Diagnosis not present

## 2016-02-11 DIAGNOSIS — Z79899 Other long term (current) drug therapy: Secondary | ICD-10-CM | POA: Diagnosis not present

## 2016-02-11 DIAGNOSIS — F1320 Sedative, hypnotic or anxiolytic dependence, uncomplicated: Secondary | ICD-10-CM | POA: Diagnosis not present

## 2016-02-11 DIAGNOSIS — F1420 Cocaine dependence, uncomplicated: Secondary | ICD-10-CM | POA: Diagnosis not present

## 2016-02-11 DIAGNOSIS — F1120 Opioid dependence, uncomplicated: Secondary | ICD-10-CM | POA: Diagnosis not present

## 2016-02-12 DIAGNOSIS — F1120 Opioid dependence, uncomplicated: Secondary | ICD-10-CM | POA: Diagnosis not present

## 2016-02-12 DIAGNOSIS — F1320 Sedative, hypnotic or anxiolytic dependence, uncomplicated: Secondary | ICD-10-CM | POA: Diagnosis not present

## 2016-02-12 DIAGNOSIS — F1420 Cocaine dependence, uncomplicated: Secondary | ICD-10-CM | POA: Diagnosis not present

## 2016-02-13 DIAGNOSIS — F1320 Sedative, hypnotic or anxiolytic dependence, uncomplicated: Secondary | ICD-10-CM | POA: Diagnosis not present

## 2016-02-13 DIAGNOSIS — F1120 Opioid dependence, uncomplicated: Secondary | ICD-10-CM | POA: Diagnosis not present

## 2016-02-13 DIAGNOSIS — F1420 Cocaine dependence, uncomplicated: Secondary | ICD-10-CM | POA: Diagnosis not present

## 2016-03-07 DIAGNOSIS — R21 Rash and other nonspecific skin eruption: Secondary | ICD-10-CM | POA: Diagnosis not present

## 2016-03-24 DIAGNOSIS — T50904A Poisoning by unspecified drugs, medicaments and biological substances, undetermined, initial encounter: Secondary | ICD-10-CM | POA: Diagnosis not present

## 2016-07-02 DIAGNOSIS — F1120 Opioid dependence, uncomplicated: Secondary | ICD-10-CM | POA: Diagnosis not present

## 2016-07-03 DIAGNOSIS — F1120 Opioid dependence, uncomplicated: Secondary | ICD-10-CM | POA: Diagnosis not present

## 2016-07-04 DIAGNOSIS — F1120 Opioid dependence, uncomplicated: Secondary | ICD-10-CM | POA: Diagnosis not present

## 2016-07-05 DIAGNOSIS — F1120 Opioid dependence, uncomplicated: Secondary | ICD-10-CM | POA: Diagnosis not present

## 2016-07-06 DIAGNOSIS — F1120 Opioid dependence, uncomplicated: Secondary | ICD-10-CM | POA: Diagnosis not present

## 2016-07-14 DIAGNOSIS — F1120 Opioid dependence, uncomplicated: Secondary | ICD-10-CM | POA: Diagnosis not present

## 2016-07-15 DIAGNOSIS — F1120 Opioid dependence, uncomplicated: Secondary | ICD-10-CM | POA: Diagnosis not present

## 2016-07-16 DIAGNOSIS — S83511S Sprain of anterior cruciate ligament of right knee, sequela: Secondary | ICD-10-CM | POA: Diagnosis not present

## 2016-07-16 DIAGNOSIS — F1120 Opioid dependence, uncomplicated: Secondary | ICD-10-CM | POA: Diagnosis not present

## 2016-07-16 DIAGNOSIS — F1320 Sedative, hypnotic or anxiolytic dependence, uncomplicated: Secondary | ICD-10-CM | POA: Diagnosis not present

## 2016-09-04 DIAGNOSIS — F1120 Opioid dependence, uncomplicated: Secondary | ICD-10-CM | POA: Diagnosis not present

## 2016-09-04 DIAGNOSIS — F1320 Sedative, hypnotic or anxiolytic dependence, uncomplicated: Secondary | ICD-10-CM | POA: Diagnosis not present

## 2016-09-04 DIAGNOSIS — F1410 Cocaine abuse, uncomplicated: Secondary | ICD-10-CM | POA: Diagnosis not present

## 2016-09-05 DIAGNOSIS — F1420 Cocaine dependence, uncomplicated: Secondary | ICD-10-CM | POA: Diagnosis not present

## 2016-09-05 DIAGNOSIS — F1320 Sedative, hypnotic or anxiolytic dependence, uncomplicated: Secondary | ICD-10-CM | POA: Diagnosis not present

## 2016-09-05 DIAGNOSIS — F1120 Opioid dependence, uncomplicated: Secondary | ICD-10-CM | POA: Diagnosis not present

## 2016-09-06 DIAGNOSIS — F1420 Cocaine dependence, uncomplicated: Secondary | ICD-10-CM | POA: Diagnosis not present

## 2016-09-06 DIAGNOSIS — F1320 Sedative, hypnotic or anxiolytic dependence, uncomplicated: Secondary | ICD-10-CM | POA: Diagnosis not present

## 2016-09-06 DIAGNOSIS — F1120 Opioid dependence, uncomplicated: Secondary | ICD-10-CM | POA: Diagnosis not present

## 2016-09-07 DIAGNOSIS — F1410 Cocaine abuse, uncomplicated: Secondary | ICD-10-CM | POA: Diagnosis not present

## 2016-09-07 DIAGNOSIS — F1320 Sedative, hypnotic or anxiolytic dependence, uncomplicated: Secondary | ICD-10-CM | POA: Diagnosis not present

## 2016-09-07 DIAGNOSIS — F1120 Opioid dependence, uncomplicated: Secondary | ICD-10-CM | POA: Diagnosis not present

## 2016-09-07 DIAGNOSIS — F1420 Cocaine dependence, uncomplicated: Secondary | ICD-10-CM | POA: Diagnosis not present

## 2016-09-08 DIAGNOSIS — F1120 Opioid dependence, uncomplicated: Secondary | ICD-10-CM | POA: Diagnosis not present

## 2016-09-08 DIAGNOSIS — F1320 Sedative, hypnotic or anxiolytic dependence, uncomplicated: Secondary | ICD-10-CM | POA: Diagnosis not present

## 2016-09-08 DIAGNOSIS — F1420 Cocaine dependence, uncomplicated: Secondary | ICD-10-CM | POA: Diagnosis not present

## 2016-09-09 DIAGNOSIS — F1120 Opioid dependence, uncomplicated: Secondary | ICD-10-CM | POA: Diagnosis not present

## 2016-09-09 DIAGNOSIS — F1320 Sedative, hypnotic or anxiolytic dependence, uncomplicated: Secondary | ICD-10-CM | POA: Diagnosis not present

## 2016-09-09 DIAGNOSIS — F1420 Cocaine dependence, uncomplicated: Secondary | ICD-10-CM | POA: Diagnosis not present

## 2016-09-10 DIAGNOSIS — F1320 Sedative, hypnotic or anxiolytic dependence, uncomplicated: Secondary | ICD-10-CM | POA: Diagnosis not present

## 2016-09-10 DIAGNOSIS — F1120 Opioid dependence, uncomplicated: Secondary | ICD-10-CM | POA: Diagnosis not present

## 2016-09-10 DIAGNOSIS — F1410 Cocaine abuse, uncomplicated: Secondary | ICD-10-CM | POA: Diagnosis not present

## 2016-09-11 DIAGNOSIS — N179 Acute kidney failure, unspecified: Secondary | ICD-10-CM | POA: Diagnosis not present

## 2016-09-11 DIAGNOSIS — R000 Tachycardia, unspecified: Secondary | ICD-10-CM | POA: Diagnosis not present

## 2016-09-11 DIAGNOSIS — T50901A Poisoning by unspecified drugs, medicaments and biological substances, accidental (unintentional), initial encounter: Secondary | ICD-10-CM | POA: Diagnosis not present

## 2016-09-11 DIAGNOSIS — I369 Nonrheumatic tricuspid valve disorder, unspecified: Secondary | ICD-10-CM | POA: Diagnosis not present

## 2016-09-11 DIAGNOSIS — R0602 Shortness of breath: Secondary | ICD-10-CM | POA: Diagnosis not present

## 2016-09-11 DIAGNOSIS — I952 Hypotension due to drugs: Secondary | ICD-10-CM | POA: Diagnosis not present

## 2016-09-11 DIAGNOSIS — R1011 Right upper quadrant pain: Secondary | ICD-10-CM | POA: Diagnosis not present

## 2016-09-11 DIAGNOSIS — J690 Pneumonitis due to inhalation of food and vomit: Secondary | ICD-10-CM | POA: Diagnosis not present

## 2016-09-11 DIAGNOSIS — R7309 Other abnormal glucose: Secondary | ICD-10-CM | POA: Diagnosis not present

## 2016-09-11 DIAGNOSIS — R0902 Hypoxemia: Secondary | ICD-10-CM | POA: Diagnosis not present

## 2016-09-11 DIAGNOSIS — F439 Reaction to severe stress, unspecified: Secondary | ICD-10-CM | POA: Diagnosis not present

## 2016-09-11 DIAGNOSIS — A419 Sepsis, unspecified organism: Secondary | ICD-10-CM | POA: Diagnosis not present

## 2016-09-11 DIAGNOSIS — T401X1A Poisoning by heroin, accidental (unintentional), initial encounter: Secondary | ICD-10-CM | POA: Diagnosis not present

## 2016-09-11 DIAGNOSIS — J9601 Acute respiratory failure with hypoxia: Secondary | ICD-10-CM | POA: Diagnosis not present

## 2016-09-11 DIAGNOSIS — F1120 Opioid dependence, uncomplicated: Secondary | ICD-10-CM | POA: Diagnosis not present

## 2016-09-11 DIAGNOSIS — D72829 Elevated white blood cell count, unspecified: Secondary | ICD-10-CM | POA: Diagnosis not present

## 2016-09-11 DIAGNOSIS — R042 Hemoptysis: Secondary | ICD-10-CM | POA: Diagnosis not present

## 2016-09-14 DIAGNOSIS — F1320 Sedative, hypnotic or anxiolytic dependence, uncomplicated: Secondary | ICD-10-CM | POA: Diagnosis not present

## 2016-09-14 DIAGNOSIS — F1120 Opioid dependence, uncomplicated: Secondary | ICD-10-CM | POA: Diagnosis not present

## 2016-09-14 DIAGNOSIS — F1420 Cocaine dependence, uncomplicated: Secondary | ICD-10-CM | POA: Diagnosis not present

## 2016-10-07 DIAGNOSIS — F1320 Sedative, hypnotic or anxiolytic dependence, uncomplicated: Secondary | ICD-10-CM | POA: Diagnosis not present

## 2016-10-07 DIAGNOSIS — F1120 Opioid dependence, uncomplicated: Secondary | ICD-10-CM | POA: Diagnosis not present

## 2016-10-07 DIAGNOSIS — F1410 Cocaine abuse, uncomplicated: Secondary | ICD-10-CM | POA: Diagnosis not present

## 2017-01-31 ENCOUNTER — Encounter (HOSPITAL_COMMUNITY): Payer: Self-pay | Admitting: Emergency Medicine

## 2017-01-31 ENCOUNTER — Emergency Department (HOSPITAL_COMMUNITY)
Admission: EM | Admit: 2017-01-31 | Discharge: 2017-01-31 | Disposition: A | Discharge status: Home / Self Care | Payer: BC Managed Care – PPO | Attending: Emergency Medicine | Admitting: Emergency Medicine

## 2017-01-31 DIAGNOSIS — F1110 Opioid abuse, uncomplicated: Secondary | ICD-10-CM | POA: Diagnosis not present

## 2017-01-31 DIAGNOSIS — Z5321 Procedure and treatment not carried out due to patient leaving prior to being seen by health care provider: Secondary | ICD-10-CM | POA: Insufficient documentation

## 2017-01-31 NOTE — ED Notes (Signed)
Bed: WLPT4 Expected date:  Expected time:  Means of arrival:  Comments: 

## 2017-01-31 NOTE — ED Notes (Signed)
Pt and mother leaving prior to being seen. Pt alert and ambulatory

## 2017-01-31 NOTE — ED Notes (Signed)
Pt and mother report that they want to go home. Pt has been alert and oriented. VS WNL on monitor. Explained we are waiting for a provider to see him, prior to going home and getting a work note.
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
## 2017-01-31 NOTE — ED Notes (Signed)
Family at bedside. 

## 2017-04-01 ENCOUNTER — Encounter: Payer: Self-pay | Admitting: Internal Medicine

## 2017-04-01 DIAGNOSIS — F1120 Opioid dependence, uncomplicated: Secondary | ICD-10-CM | POA: Insufficient documentation

## 2017-04-01 NOTE — Assessment & Plan Note (Signed)
Heroin addition with daily use:  Suboxone protocol for 30 days for medical detox with conversion to vivitrol  AA or NA attendance  And bi weekly drug test reguired  Referral to behavioral health for intensive out pt therapy once medical detox completed Hx of Hepatitis B chronic...  LFTS IV drug abuse  recommend HIV screen

## 2017-04-01 NOTE — Progress Notes (Signed)
Erik [name] is a 22 yo asian male with a long hx of heroin abuse. He has been through prior treatment programs both with Suboxone and Vivitrol. He present for counseling and to discuss treatment options.  He is currently on probation and has drug testing monthly. He continues to use and admits to current daily use. Part of his motivation may be his risk for failing the requirements of his probation.  He deniesHIV and had testing at his last rehabilitation program.  He has a hx of chronic Hep B and bacterial endocarditis He is allergic to PCN.  We reviewed his options for treatment. Since he is currently using he would need to start a Suboxone program with a taper for withdrawal and a daily maintanance dose for 30 days at which time he would convert to vivitrol injections.   We reviewed risks and possible side effects and he signed a consent to treat that will be scanned into the system.  I will complete the required 8 hours for suboxone treatment and set up initial therapy meetings. Drug monitoring and weekly meetings will be required  See problem list for assessment created today

## 2017-04-21 ENCOUNTER — Telehealth (HOSPITAL_COMMUNITY): Payer: Self-pay

## 2017-04-21 ENCOUNTER — Ambulatory Visit (HOSPITAL_COMMUNITY): Payer: Self-pay

## 2017-04-21 NOTE — Patient Outreach (Signed)
CPSS spoke with the patient on the phone and emailed the patient a list of suboxone providers in the [place] area. Patient stated that he was not interested in outpatient treatment, but he just wants to meet with a provider. CPSS also provided the patient information about [facility], so he can contact them for any further help with finding a suboxone provider.

## 2018-01-23 ENCOUNTER — Encounter (HOSPITAL_COMMUNITY): Payer: Self-pay | Admitting: *Deleted

## 2018-01-23 ENCOUNTER — Ambulatory Visit (HOSPITAL_COMMUNITY)
Admission: EM | Admit: 2018-01-23 | Discharge: 2018-01-23 | Disposition: A | Discharge status: Home / Self Care | Payer: BC Managed Care – PPO | Attending: Family Medicine | Admitting: Family Medicine

## 2018-01-23 DIAGNOSIS — Z880 Allergy status to penicillin: Secondary | ICD-10-CM | POA: Insufficient documentation

## 2018-01-23 DIAGNOSIS — L0291 Cutaneous abscess, unspecified: Secondary | ICD-10-CM | POA: Diagnosis not present

## 2018-01-23 DIAGNOSIS — Z882 Allergy status to sulfonamides status: Secondary | ICD-10-CM | POA: Insufficient documentation

## 2018-01-23 DIAGNOSIS — F17210 Nicotine dependence, cigarettes, uncomplicated: Secondary | ICD-10-CM | POA: Diagnosis not present

## 2018-01-23 DIAGNOSIS — M25532 Pain in left wrist: Secondary | ICD-10-CM | POA: Diagnosis present

## 2018-01-23 DIAGNOSIS — L02414 Cutaneous abscess of left upper limb: Secondary | ICD-10-CM | POA: Diagnosis not present

## 2018-01-23 DIAGNOSIS — M25432 Effusion, left wrist: Secondary | ICD-10-CM | POA: Diagnosis present

## 2018-01-23 MED ORDER — MUPIROCIN 2 % EX OINT: 1.0000 "application " | TOPICAL_OINTMENT | Freq: Two times a day (BID) | CUTANEOUS | 0 refills | Status: DC

## 2018-01-23 MED ORDER — IPRATROPIUM BROMIDE 0.06 % NA SOLN: 2.0000 | Freq: Four times a day (QID) | NASAL | 0 refills | Status: DC

## 2018-01-23 MED ORDER — DOXYCYCLINE HYCLATE 100 MG PO CAPS: 100.0000 mg | ORAL_CAPSULE | Freq: Two times a day (BID) | ORAL | 0 refills | Status: DC

## 2018-01-23 NOTE — ED Provider Notes (Signed)
[place]    CSN: [id number] Arrival date & time: 01/23/18  1725     History   Chief Complaint Chief Complaint  Patient presents with  . Abscess  . Sinusitis    HPI Erik [name] is a 22 y.o. male.   22 year old male comes in for left wrist swelling, pain, erythema that started about 1.5 weeks ago.  States started out as a pimple, and slowly increased in size.  Tried to squeeze the area and has had purulent drainage to the area.  Continued with pain, erythema, warmth.  Denies fever, chills, night sweats.  History of IV drug use with MRSA infection.  Denies current IV drug use, last drug use 5 months ago.  Tattoo on the arm has been 24 years old.  States prior to symptoms starting, had itching to the arm, and has been scratching with a few abrasions from scratching, and not sure if this could have caused the symptoms.   Patient also worries of sinus infection.  States started having rhinorrhea yesterday.  Denies other symptoms such as cough, sore throat, fever.  Has not tried anything for the symptoms.     Past Medical History:  Diagnosis Date  . 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 Anxiety   . Drug abuse and dependence ([place])    admit with OD 11/2014: rhabdo and AKI, ARF -   . Hepatitis B    chronic viral/hx per notes 12/05/2015  . History of endocarditis    LESCHBER, [place] A [[id number]]  . IV drug user    heroine  . NSTEMI (non-ST elevated myocardial infarction) ([place])    Archie Endo 12/05/2015  . Pneumonia 12/04/2015  . Septic pulmonary embolism ([place]) 12/07/2015  . Sinusitis   . 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    Patient Active Problem List   Diagnosis Date Noted  . Heroin dependence ([place]) 04/01/2017  . 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  . Septic pulmonary embolism ([place]) 12/07/2015  . Staphylococcus aureus bacteremia 12/06/2015  . Thrombocytopenia ([place]) 12/05/2015  .  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 Cocaine abuse ([place])   . Acute renal failure due to rhabdomyolysis ([place]) 02/22/2015  . NSTEMI (non-ST elevated myocardial infarction) ([place])   . Traumatic rhabdomyolysis ([place]) 11/10/2014  . Viral hepatitis B chronic ([place]) 11/10/2014  . IVDU (intravenous drug user)   . Polysubstance dependence ([place]) 11/16/2013  . Benzodiazepine dependence ([place]) 11/16/2013  . Drug overdose 04/16/2013    Past Surgical History:  Procedure Laterality Date  . CARDIAC CATHETERIZATION N/A 02/22/2015   Procedure: Left Heart Cath and Coronary Angiography;  Surgeon: Troy Sine, MD;  Location: [facility];  Service: Cardiovascular;  Laterality: N/A;  . TEE WITHOUT CARDIOVERSION N/A 02/24/2015   Procedure: TRANSESOPHAGEAL ECHOCARDIOGRAM (TEE);  Surgeon: Skeet Latch, MD;  Location: [facility];  Service: Cardiovascular;  Laterality: N/A;  . TEE WITHOUT CARDIOVERSION N/A 12/09/2015   Procedure: TRANSESOPHAGEAL ECHOCARDIOGRAM (TEE);  Surgeon: [name], MD;  Location: [facility];  Service: Cardiovascular;  Laterality: N/A;       Home Medications    Prior to Admission medications   Medication Sig Start Date End Date Taking? Authorizing Provider  methadone (DOLOPHINE) 10 MG tablet Take 80 mg by mouth every 8 (eight) hours.   Yes [provider]  doxycycline (VIBRAMYCIN) 100 MG capsule Take 1 capsule (100 mg total) by mouth 2 (two) times daily. 01/23/18   Ok Edwards, PA-C  ipratropium (ATROVENT) 0.06 % nasal spray Place 2 sprays into both nostrils 4 (four) times daily. 01/23/18   Tasia Catchings, Amy V, PA-C  mupirocin ointment (BACTROBAN) 2 % Apply 1 application topically 2 (two) times daily. 01/23/18   Ok Edwards PA-C    Family History Family History  Adopted: Yes    Social History Social History   Tobacco Use  . Smoking status: Current Every Day Smoker    Packs/day: 1.00    Types: Cigarettes  . Smokeless tobacco: Never Used  . Tobacco  comment: 12/05/2015 "haven't had a cigarette in 2 wks"  Substance Use Topics  . Alcohol use: Yes    Comment: 12/05/2015 "don't drink now"  . Drug use: Yes    Types: Cocaine, Marijuana, IV    Comment: marijuana, heroin     Allergies   Penicillins and Sulfa antibiotics   Review of Systems Review of Systems  Reason unable to perform ROS: See HPI as above.     Physical Exam Triage Vital Signs ED Triage Vitals  Enc Vitals Group     BP 01/23/18 1813 119/70     Pulse Rate 01/23/18 1813 70     Resp 01/23/18 1813 16     Temp 01/23/18 1813 98.8 F (37.1 C)     Temp Source 01/23/18 1813 Oral     SpO2 01/23/18 1813 97 %     Weight --      Height --      Head Circumference --      Peak Flow --      Pain Score 01/23/18 1809 4     Pain Loc --      Pain Edu? --      Excl. in [place]? --    No data found.  Updated Vital Signs BP 119/70 (BP Location: Left Arm)   Pulse 70   Temp 98.8 F (37.1 C) (Oral)   Resp 16   SpO2 97%   Physical Exam  Constitutional: He is oriented to person, place, and time. He appears well-developed and well-nourished. No distress.  HENT:  Head: Normocephalic and atraumatic.  Eyes: Pupils are equal, round, and reactive to light. Conjunctivae are normal.  Musculoskeletal:  Self draining abscess to the left wrist.  Surrounding cellulitis about 3 cm x 3 cm wide.  Full range of motion of wrist.  Neurological: He is alert and oriented to person, place, and time.  Skin: He is not diaphoretic.       UC Treatments / Results  Labs (all labs ordered are listed, but only abnormal results are displayed) Labs Reviewed  AEROBIC CULTURE (SUPERFICIAL SPECIMEN)    EKG None  Radiology No results found.  Procedures Procedures (including critical care time)  Medications Ordered in UC Medications - No data to display  Initial Impression / Assessment and Plan / UC Course  I have reviewed the triage vital signs and the nursing notes.  Pertinent labs &  imaging results that were available during my care of the patient were reviewed by me and considered in my medical decision making (see chart for details).    Self draining abscess with good sized opening.  Area cleaned and flushed, abscess to loculated.  Wound culture obtained due to history of IV drug use, MRSA infection.  Start doxycycline as directed.  Wound care instructions provided.  Return precautions given.  Patient expresses understanding and agrees to plan.  Final Clinical Impressions(s) / UC Diagnoses   Final diagnoses:  Abscess  ED Prescriptions    Medication Sig Dispense Auth. Provider   doxycycline (VIBRAMYCIN) 100 MG capsule Take 1 capsule (100 mg total) by mouth 2 (two) times daily. 20 capsule Tasia Catchings, Amy V, PA-C   mupirocin ointment (BACTROBAN) 2 % Apply 1 application topically 2 (two) times daily. 22 g Yu, Amy V, PA-C   ipratropium (ATROVENT) 0.06 % nasal spray Place 2 sprays into both nostrils 4 (four) times daily. 15 mL Tobin Chad, [place] 01/23/18 1900

## 2018-01-23 NOTE — ED Triage Notes (Addendum)
Patient reports noticing bump to right anterior wrist approx 1.5 weeks ago. States that he has been squeezing the area and has had puss and drainage. Reports the area is not getting any better. Patient reports prior to popping it was warm to the touch and hard with swelling to hand.   Patient also reports sinus infection.  Patient and I discussed IV drug use, patient states that he is not currently doing drugs. Is on methadone.

## 2018-01-23 NOTE — Discharge Instructions (Signed)
Start doxycycline as directed for skin infection. Dress wound every day. You can clean gently with soap and water, then dress with bactroban ointment. Warm compress as directed. Monitor for spreading redness, increased warmth, fever, follow up for reevaluation needed.  As discussed, nasal drainage most likely due to viral illness given 1 day history. You can start atrovent nasal spray to help with the symptoms. Doxycycline will also cover for bacterial sinus infection.

## 2018-01-26 LAB — AEROBIC CULTURE W GRAM STAIN (SUPERFICIAL SPECIMEN)

## 2018-01-26 LAB — AEROBIC CULTURE  (SUPERFICIAL SPECIMEN)

## 2018-01-30 ENCOUNTER — Telehealth (HOSPITAL_COMMUNITY): Payer: Self-pay

## 2018-01-30 NOTE — Telephone Encounter (Signed)
Attempted call x 1 with no answer

## 2018-02-01 ENCOUNTER — Telehealth (HOSPITAL_COMMUNITY): Payer: Self-pay | Admitting: Emergency Medicine

## 2018-02-01 NOTE — Telephone Encounter (Signed)
Attempted to call x2, someone answered, states he will call back later.
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
## 2019-04-24 ENCOUNTER — Ambulatory Visit (INDEPENDENT_AMBULATORY_CARE_PROVIDER_SITE_OTHER): Payer: BC Managed Care – PPO | Admitting: Licensed Clinical Social Worker

## 2019-04-24 ENCOUNTER — Other Ambulatory Visit: Payer: Self-pay

## 2019-04-24 DIAGNOSIS — F1320 Sedative, hypnotic or anxiolytic dependence, uncomplicated: Secondary | ICD-10-CM

## 2019-04-24 DIAGNOSIS — F1220 Cannabis dependence, uncomplicated: Secondary | ICD-10-CM | POA: Diagnosis not present

## 2019-04-24 DIAGNOSIS — F1120 Opioid dependence, uncomplicated: Secondary | ICD-10-CM | POA: Diagnosis not present
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
## 2019-05-08 ENCOUNTER — Ambulatory Visit (INDEPENDENT_AMBULATORY_CARE_PROVIDER_SITE_OTHER): Payer: BC Managed Care – PPO | Admitting: Licensed Clinical Social Worker

## 2019-05-08 ENCOUNTER — Other Ambulatory Visit: Payer: Self-pay

## 2019-05-08 DIAGNOSIS — F1320 Sedative, hypnotic or anxiolytic dependence, uncomplicated: Secondary | ICD-10-CM

## 2019-05-08 DIAGNOSIS — F1220 Cannabis dependence, uncomplicated: Secondary | ICD-10-CM | POA: Diagnosis not present

## 2019-05-08 DIAGNOSIS — F1120 Opioid dependence, uncomplicated: Secondary | ICD-10-CM

## 2019-05-09 NOTE — Progress Notes (Signed)
   THERAPIST PROGRESS NOTE  Session Time: 4pm-4:45pm  Participation Level: Active  Behavioral Response: NeatAlertAnxious  Type of Therapy: Individual Therapy  Treatment Goals addressed: Coping  Interventions: CBT, Motivational Interviewing  Summary: Client presented alert and oriented x5 for today's session. Client provided an update and discussed current stressors regarding stress in closing on a home purchase this week. Client reported he feels accomplished in buying his own home however feels stressed when thinking of the financial responsibility this brings. Client provided an update on his hopes to complete drug court within the next month or so and identified that this level of accountability will no longer be present. Client discussed potential triggers for relapse. Client discussed options to maintain accountability throughout his recovery and agreed that attending NA and obtaining a sponsor may be beneficial to him. Client denied current SI/HI/psychosis.    Suicidal/Homicidal: Nowithout intent/plan  Therapist Response: Clinician met with client for today's scheduled therapy session. Clinician assessed client's mood, recent stressors, and SI/HI/psychosis. Clinician congratulated client in [place] his own home and provided space for client to reflect on his progress over the last few months since being discharged from inpatient treatment services. Clinician validated client's feelings and utilized CBT to challenge thoughts related to potential stressors and their ability to impact his sobriety. Clinician encouarged client to engage in a support or NA group to build a network and obtain a sponsor for additional accountability.    Plan: Return again in 4 weeks.  Diagnosis: Axis I: Heroin dependence ([place]), Cannabis use disorder, severe, dependence ([place]), Benzodiazepine dependence ([place])  ,       Renee Harder, LCSW 05/09/2019

## 2019-06-19 ENCOUNTER — Other Ambulatory Visit: Payer: Self-pay

## 2019-06-19 ENCOUNTER — Ambulatory Visit (INDEPENDENT_AMBULATORY_CARE_PROVIDER_SITE_OTHER): Payer: BC Managed Care – PPO | Admitting: Licensed Clinical Social Worker

## 2019-06-19 DIAGNOSIS — F1120 Opioid dependence, uncomplicated: Secondary | ICD-10-CM | POA: Diagnosis not present

## 2019-06-19 DIAGNOSIS — F1220 Cannabis dependence, uncomplicated: Secondary | ICD-10-CM

## 2019-06-19 DIAGNOSIS — F1320 Sedative, hypnotic or anxiolytic dependence, uncomplicated: Secondary | ICD-10-CM

## 2019-06-20 NOTE — Progress Notes (Signed)
   THERAPIST PROGRESS NOTE  Session Time: 4pm-4:40pm  Participation Level: Active  Behavioral Response: Fairly GroomedAlertEuthymic  Type of Therapy: Individual Therapy  Treatment Goals addressed: Coping, Impulse control  Interventions: CBT, Motivational Interviewing and Supportive  Summary: Client presented alert and oriented x5 for scheduled session. Client checked in and provided an update reporting that he graduated drug court 1 month ago and relapsed by using 1x. Client reported he bought a large amount of drugs however did not enjoy the high so he ended up throwing them away however reports that he has continued to smoke cannabis. Client acknowledged risks in doing so however reported that he plans to continue smoking cannabis even though his family expresses concern for this choice. Client denied attending any support groups and became defensive when this was recommended by reporting "I don't want our time to be focused on attending meetings". Client acknowledged that a support network would be beneficial to him however reports minimal motivation to engage in any NA or AA meetings. Client struggled to identify specific skills or people he can contact when feeling an urge to use and reported that he is content with how things stand at this point. Client denied SI/HI/psychosis.   Suicidal/Homicidal: Nowithout intent/plan  Therapist Response: Clinician joined client and facilitated check in assessing for recent stressors, relapse, and SI/HI/psychosis. Clinician engaged in discussion on reported relapse and attempted to challenge client's thoughts regarding continued cannabis use while attempting to maintain sobriety from heroin and cocaine. Clinician confronted client on these risky bx and encouraged him to build a support network to access when feelings urges to use. Clinician validated client's feelings and acknowledged client's lack of accountability for sobriety now that he is no longer  involved with drug court and attempted to problem solve means of maintaining accountability in other forms. Clinician agreed to see client again in 2 weeks.  Plan: Return again in 2-3 weeks.  Diagnosis: Axis I: Heroin dependence, cannabis use d/o, Benzodiazapine dependence        Renee Harder, LCSW 06/20/2019

## 2019-07-10 ENCOUNTER — Ambulatory Visit (HOSPITAL_COMMUNITY): Payer: BC Managed Care – PPO | Admitting: Licensed Clinical Social Worker

## 2019-07-10 ENCOUNTER — Other Ambulatory Visit: Payer: Self-pay

## 2019-07-16 ENCOUNTER — Telehealth (HOSPITAL_COMMUNITY): Payer: Self-pay | Admitting: Licensed Clinical Social Worker

## 2019-07-24 ENCOUNTER — Ambulatory Visit (HOSPITAL_COMMUNITY): Payer: BC Managed Care – PPO | Admitting: Licensed Clinical Social Worker

## 2019-08-07 ENCOUNTER — Ambulatory Visit (HOSPITAL_COMMUNITY): Payer: BC Managed Care – PPO | Admitting: Licensed Clinical Social Worker

## 2019-11-13 ENCOUNTER — Encounter (HOSPITAL_COMMUNITY): Payer: Self-pay

## 2019-11-13 ENCOUNTER — Other Ambulatory Visit: Payer: Self-pay

## 2019-11-13 ENCOUNTER — Ambulatory Visit (HOSPITAL_COMMUNITY)
Admission: EM | Admit: 2019-11-13 | Discharge: 2019-11-13 | Disposition: A | Discharge status: Home / Self Care | Payer: BC Managed Care – PPO | Attending: Urgent Care | Admitting: Urgent Care

## 2019-11-13 DIAGNOSIS — R21 Rash and other nonspecific skin eruption: Secondary | ICD-10-CM

## 2019-11-13 MED ORDER — MUPIROCIN 2 % EX OINT: 1.0000 "application " | TOPICAL_OINTMENT | Freq: Three times a day (TID) | CUTANEOUS | 0 refills | Status: DC

## 2019-11-13 MED ORDER — HYDROXYZINE HCL 25 MG PO TABS: 12.5000 mg | ORAL_TABLET | Freq: Three times a day (TID) | ORAL | 0 refills | Status: DC | PRN

## 2019-11-13 NOTE — ED Triage Notes (Signed)
Pt presents with complaints of black dot like rash on his feet, hands, lip, and scrotum x 3 weeks.

## 2019-11-13 NOTE — ED Provider Notes (Signed)
[place]   MRN: [id number] DOB: November 02, 1995  Subjective:   Erik [name] is a 24 y.o. male presenting for 3-week history of persistent irritation of the lip, scrotal area in different parts of his limbs.  Patient is very concerned that this could be scabies.  Generally they do not bother him but he has been scratching at them significantly, states that the scab over and would like to make sure he gets medication for this.  Denies fever, pain, drainage of pus or bleeding.  No current facility-administered medications for this encounter.  Current Outpatient Medications:    doxycycline (VIBRAMYCIN) 100 MG capsule, Take 1 capsule (100 mg total) by mouth 2 (two) times daily., Disp: 20 capsule, Rfl: 0   hydrOXYzine (ATARAX/VISTARIL) 25 MG tablet, Take 0.5-1 tablets (12.5-25 mg total) by mouth every 8 (eight) hours as needed for itching., Disp: 30 tablet, Rfl: 0   ipratropium (ATROVENT) 0.06 % nasal spray, Place 2 sprays into both nostrils 4 (four) times daily., Disp: 15 mL, Rfl: 0   methadone (DOLOPHINE) 10 MG tablet, Take 80 mg by mouth every 8 (eight) hours., Disp: , Rfl:    mupirocin ointment (BACTROBAN) 2 %, Apply 1 application topically 3 (three) times daily., Disp: 60 g, Rfl: 0   Allergies  Allergen Reactions   Penicillins Swelling    Childhood allergy Has patient had a PCN reaction causing immediate rash, facial/tongue/throat swelling, SOB or lightheadedness with hypotension: Yes Has patient had a PCN reaction causing severe rash involving mucus membranes or skin necrosis: Unknown Has patient had a PCN reaction that required hospitalization: Unknown Has patient had a PCN reaction occurring within the last 10 years:No If all of the above answers are "NO", then may proceed with Cephalosporin use.    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   Anxiety    Drug abuse and dependence ([place])    admit with OD 11/2014: rhabdo and AKI, ARF -    Hepatitis B    chronic viral/hx per notes 12/05/2015   History of endocarditis    LESCHBER, VALERIE A [[id number]]   IV drug user    heroine   NSTEMI (non-ST elevated myocardial infarction) ([place])    Archie Endo 12/05/2015   Pneumonia 12/04/2015   Septic pulmonary embolism ([place]) 12/07/2015   Sinusitis    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     Past Surgical History:  Procedure Laterality Date   CARDIAC CATHETERIZATION N/A 02/22/2015   Procedure: Left Heart Cath and Coronary Angiography;  Surgeon: Troy Sine, MD;  Location: [facility];  Service: Cardiovascular;  Laterality: N/A;   TEE WITHOUT CARDIOVERSION N/A 02/24/2015   Procedure: TRANSESOPHAGEAL ECHOCARDIOGRAM (TEE);  Surgeon: Skeet Latch, MD;  Location: [facility];  Service: Cardiovascular;  Laterality: N/A;   TEE WITHOUT CARDIOVERSION N/A 12/09/2015   Procedure: TRANSESOPHAGEAL ECHOCARDIOGRAM (TEE);  Surgeon: [name], MD;  Location: [facility];  Service: Cardiovascular;  Laterality: N/A;    Family History  Adopted: Yes    Social History   Tobacco Use   Smoking status: Current Every Day Smoker    Packs/day: 1.00    Types: Cigarettes   Smokeless tobacco: Never Used   Tobacco comment: 12/05/2015 "haven't had a cigarette in 2 wks"  Substance Use Topics   Alcohol use: Yes    Comment:  12/05/2015 "don't drink now"   Drug use: Yes    Types: Cocaine, Marijuana, IV    Comment: marijuana, heroin    ROS   Objective:   Vitals: BP 134/81    Pulse 62    Temp 98.3 F (36.8 C)    Resp 19    SpO2 99%   Physical Exam Constitutional:      General: He is not in acute distress.    Appearance: Normal appearance. He is well-developed and normal weight. He is not ill-appearing, toxic-appearing or diaphoretic.  HENT:     Head: Normocephalic  and atraumatic.     Right Ear: External ear normal.     Left Ear: External ear normal.     Nose: Nose normal.     Mouth/Throat:     Pharynx: Oropharynx is clear.  Eyes:     General: No scleral icterus.       Right eye: No discharge.        Left eye: No discharge.     Extraocular Movements: Extraocular movements intact.     Pupils: Pupils are equal, round, and reactive to light.  Cardiovascular:     Rate and Rhythm: Normal rate.  Pulmonary:     Effort: Pulmonary effort is normal.  Musculoskeletal:     Cervical back: Normal range of motion.  Skin:    General: Skin is warm and dry.     Findings: Lesion (Multiple skin lesions that are dry and scaly, excoriations diffusely scattered as depicted) present.  Neurological:     Mental Status: He is alert and oriented to person, place, and time.  Psychiatric:        Mood and Affect: Mood normal.        Behavior: Behavior normal.        Thought Content: Thought content normal.        Judgment: Judgment normal.             Assessment and Plan :   PDMP not reviewed this encounter.  1. Rash and nonspecific skin eruption     Low suspicion for scabies.  Will address patient's concern for infection with Bactroban ointment.  Recommended STI testing but patient declined for now.  Use hydroxyzine for itching. Counseled patient on potential for adverse effects with medications prescribed/recommended today, ER and return-to-clinic precautions discussed, patient verbalized understanding.    Jaynee Eagles, [place] 11/13/19 2027

## 2021-09-22 ENCOUNTER — Ambulatory Visit (HOSPITAL_COMMUNITY)
Admission: EM | Admit: 2021-09-22 | Discharge: 2021-09-22 | Disposition: A | Discharge status: Home / Self Care | Payer: Commercial Managed Care - HMO | Attending: Emergency Medicine | Admitting: Emergency Medicine

## 2021-09-22 ENCOUNTER — Encounter (HOSPITAL_COMMUNITY): Payer: Self-pay | Admitting: Emergency Medicine

## 2021-09-22 DIAGNOSIS — L03113 Cellulitis of right upper limb: Secondary | ICD-10-CM | POA: Diagnosis not present

## 2021-09-22 MED ORDER — DOXYCYCLINE HYCLATE 100 MG PO CAPS: 100.0000 mg | ORAL_CAPSULE | Freq: Two times a day (BID) | ORAL | 0 refills | Status: DC

## 2021-09-22 MED ORDER — PREDNISONE 20 MG PO TABS: 40.0000 mg | ORAL_TABLET | Freq: Every day | ORAL | 0 refills | Status: DC

## 2021-09-22 NOTE — Discharge Instructions (Signed)
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

## 2021-09-22 NOTE — ED Provider Notes (Signed)
[facility]    CSN: [id number] Arrival date & time: 09/22/21  1347      History   Chief Complaint Chief Complaint  Patient presents with   Hand Swelling     HPI Erik [name] is a 26 y.o. male.   Patient presents with right hand erythema, swelling, pain and limited range of motion for 5 days.  Symptoms initially began after popping what appeared to be hit on the hand.  Symptoms have progressively worsened.feels as if the lymph nodes under his right armpit are swollen . has attempted use of Epson soaks, colloid bandages, and antibacterial soap.  Endorses he has been cleansing site at least 3 times daily but has not seen signs of improvement.  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   Anxiety    Drug abuse and dependence ([place])    admit with OD 11/2014: rhabdo and AKI, ARF -    Hepatitis B    chronic viral/hx per notes 12/05/2015   History of endocarditis    LESCHBER, VALERIE A [[id number]]   IV drug user    heroine   NSTEMI (non-ST elevated myocardial infarction) ([place])    Archie Endo 12/05/2015   Pneumonia 12/04/2015   Septic pulmonary embolism ([place]) 12/07/2015   Sinusitis    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    Patient Active Problem List   Diagnosis Date Noted   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   Septic pulmonary embolism ([place]) 12/07/2015   Staphylococcus aureus bacteremia 12/06/2015   Thrombocytopenia ([place]) 12/05/2015   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   Cocaine abuse ([place])    Acute renal failure due to rhabdomyolysis ([place]) 02/22/2015   NSTEMI (non-ST elevated myocardial infarction) ([place])    Traumatic rhabdomyolysis ([place]) 11/10/2014   Viral hepatitis B chronic ([place])  11/10/2014   IVDU (intravenous drug user)    Polysubstance dependence ([place]) 11/16/2013   Benzodiazepine dependence ([place]) 11/16/2013   Drug overdose 04/16/2013    Past Surgical History:  Procedure Laterality Date   CARDIAC CATHETERIZATION N/A 02/22/2015   Procedure: Left Heart Cath and Coronary Angiography;  Surgeon: Troy Sine, MD;  Location: [facility];  Service: Cardiovascular;  Laterality: N/A;   TEE WITHOUT CARDIOVERSION N/A 02/24/2015   Procedure: TRANSESOPHAGEAL ECHOCARDIOGRAM (TEE);  Surgeon: Skeet Latch, MD;  Location: [facility];  Service: Cardiovascular;  Laterality: N/A;   TEE WITHOUT CARDIOVERSION N/A 12/09/2015   Procedure: TRANSESOPHAGEAL ECHOCARDIOGRAM (TEE);  Surgeon: [name], MD;  Location: [facility];  Service: Cardiovascular;  Laterality: N/A;       Home Medications    Prior to Admission medications   Medication Sig Start Date End Date Taking? Authorizing Provider  doxycycline (VIBRAMYCIN) 100 MG capsule Take 1 capsule (100 mg total) by mouth 2 (two) times daily. 01/23/18   Tasia Catchings, Amy V, PA-C  hydrOXYzine (ATARAX/VISTARIL) 25 MG tablet Take 0.5-1 tablets (12.5-25 mg total) by mouth every 8 (eight) hours as needed for itching. 11/13/19   Jaynee Eagles, PA-C  ipratropium (ATROVENT) 0.06 % nasal spray Place 2 sprays into both nostrils 4 (four) times daily. 01/23/18   Tasia Catchings, Amy V, PA-C  methadone (DOLOPHINE) 10 MG tablet Take 80 mg by mouth every 8 (eight) hours.  [provider]  mupirocin ointment (BACTROBAN) 2 % Apply 1 application topically 3 (three) times daily. 11/13/19   Jaynee Eagles, PA-C    Family History Family History  Adopted: Yes    Social History Social History   Tobacco Use   Smoking status: Every Day    Packs/day: 1.00    Types: Cigarettes   Smokeless tobacco: Never   Tobacco comments:    12/05/2015 "haven't had a cigarette in 2 wks"  Substance Use Topics   Alcohol use: Yes    Comment: 12/05/2015 "don't drink now"    Drug use: Yes    Types: Cocaine, Marijuana, IV    Comment: marijuana, heroin     Allergies   Penicillins and Sulfa antibiotics   Review of Systems Review of Systems  Constitutional: Negative.   Respiratory: Negative.    Cardiovascular: Negative.   Musculoskeletal:  Positive for joint swelling. Negative for arthralgias, back pain, gait problem, myalgias, neck pain and neck stiffness.  Skin:  Positive for wound. Negative for color change, pallor and rash.  Neurological: Negative.      Physical Exam Triage Vital Signs ED Triage Vitals  Enc Vitals Group     BP 09/22/21 1413 116/76     Pulse Rate 09/22/21 1413 96     Resp 09/22/21 1413 12     Temp 09/22/21 1413 97.9 F (36.6 C)     Temp Source 09/22/21 1413 Oral     SpO2 09/22/21 1413 97 %     Weight --      Height --      Head Circumference --      Peak Flow --      Pain Score 09/22/21 1418 4     Pain Loc --      Pain Edu? --      Excl. in [place]? --    No data found.  Updated Vital Signs BP 116/76 (BP Location: Left Arm)   Pulse 96   Temp 97.9 F (36.6 C) (Oral)   Resp 12   SpO2 97%   Visual Acuity Right Eye Distance:   Left Eye Distance:   Bilateral Distance:    Right Eye Near:   Left Eye Near:    Bilateral Near:     Physical Exam Constitutional:      Appearance: Normal appearance.  HENT:     Head: Normocephalic.  Eyes:     Extraocular Movements: Extraocular movements intact.  Pulmonary:     Effort: Pulmonary effort is normal.  Musculoskeletal:     Comments: Erythema, moderate to severe swelling and tenderness noted to the right hand primarily over the base of the right metacarpal extending towards the center, limited range of motion, unable to fully flex the right thumb, sensation intact, 2+ radial pulse  Skin:    General: Skin is warm and dry.  Neurological:     Mental Status: He is alert and oriented to person, place, and time.  Psychiatric:        Mood and Affect: Mood normal.         Behavior: Behavior normal.      UC Treatments / Results  Labs (all labs ordered are listed, but only abnormal results are displayed) Labs Reviewed - No data to display  EKG   Radiology No results found.  Procedures Procedures (including critical care time)  Medications Ordered in UC Medications - No data to display  Initial Impression / Assessment and Plan / UC Course  I have  reviewed the triage vital signs and the nursing notes.  Pertinent labs & imaging results that were available during my care of the patient were reviewed by me and considered in my medical decision making (see chart for details).  Cellulitis of right hand  Hand appears to be infected, discussed with patient, doxycycline 10-day course and prednisone 40 mg burst prescribed for outpatient management, recommended continued warm soaks to help facilitate swelling and discomfort, advised cleansing daily with antibacterial soap, given strict precautions that if no improvement seen in 72 hours he is to return for reevaluation  Final Clinical Impressions(s) / UC Diagnoses   Final diagnoses:  None   Discharge Instructions   None    ED Prescriptions   None    PDMP not reviewed this encounter.   Hans Eden, NP 09/22/21 1449

## 2021-09-22 NOTE — ED Triage Notes (Addendum)
Patient c/o RT hand swelling x 5 days.   Patient endorses pain with movement of hand.   Patient endorses onset of symptoms began as a "blackhead that I popped".   Patient endorses progressively worsening symptoms.   Patient endorses " I woke up on Sunday and both the lymph nodes on my neck were swollen and now both the lymph nodes in my armpits".   Patient endorses " I have been having previous issues with these kind of cyst on the back of my leg for the past couple of months which get better with house hold remedies".   Patient has taken Tylenol, Bandages, and Neosporin with no relief of symptoms.

## 2023-04-28 ENCOUNTER — Ambulatory Visit (HOSPITAL_COMMUNITY)
Admission: EM | Admit: 2023-04-28 | Discharge: 2023-04-28 | Disposition: A | Discharge status: Home / Self Care | Payer: MEDICAID

## 2023-04-28 ENCOUNTER — Encounter (HOSPITAL_COMMUNITY): Payer: Self-pay | Admitting: Emergency Medicine

## 2023-04-28 DIAGNOSIS — H00011 Hordeolum externum right upper eyelid: Secondary | ICD-10-CM | POA: Diagnosis not present

## 2023-04-28 DIAGNOSIS — S0501XA Injury of conjunctiva and corneal abrasion without foreign body, right eye, initial encounter: Secondary | ICD-10-CM | POA: Diagnosis not present

## 2023-04-28 MED ORDER — TETRACAINE HCL 0.5 % OP SOLN
OPHTHALMIC | Status: AC
  Filled 2023-04-28: qty 4

## 2023-04-28 MED ORDER — EYE WASH OP SOLN
OPHTHALMIC | Status: AC
  Filled 2023-04-28: qty 118

## 2023-04-28 MED ORDER — FLUORESCEIN SODIUM 1 MG OP STRP
ORAL_STRIP | OPHTHALMIC | Status: AC
  Filled 2023-04-28: qty 1

## 2023-04-28 MED ORDER — ERYTHROMYCIN 5 MG/GM OP OINT: TOPICAL_OINTMENT | Freq: Four times a day (QID) | OPHTHALMIC | 0 refills | Status: AC

## 2023-04-28 NOTE — ED Provider Notes (Signed)
[facility]    CSN: [id number] Arrival date & time: 04/28/23  1313      History   Chief Complaint Chief Complaint  Patient presents with   Eye Problem    HPI Erik [name] is a 28 y.o. male.   Patient presents today with right eye swelling and redness ongoing for the past 2 days.  Reports he feels like there is something in his eye.  Denies photophobia, vision changes, headache, or floaters in his vision.  Does not wear contacts.  Was not working around any metal shavings or wood shavings when symptoms began.  He has noticed a bump pop out on the outside of his eye that he has tried to squeeze it with minimal improvement.  Reports the eye is a little bit itchy.    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    Patient Active Problem List   Diagnosis Date Noted   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   Septic pulmonary embolism ([facility]) 12/07/2015   Staphylococcus aureus bacteremia 12/06/2015   Thrombocytopenia ([facility]) 12/05/2015   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   Cocaine abuse ([facility])    Acute renal failure due to rhabdomyolysis ([facility]) 02/22/2015   NSTEMI (non-ST elevated myocardial infarction) ([facility])    Traumatic rhabdomyolysis ([facility]) 11/10/2014   Viral hepatitis B chronic ([facility]) 11/10/2014   IVDU (intravenous drug user)    Polysubstance dependence ([facility]) 11/16/2013   Benzodiazepine dependence  ([facility]) 11/16/2013   Drug overdose 04/16/2013    Past Surgical History:  Procedure Laterality Date   CARDIAC CATHETERIZATION N/A 02/22/2015   Procedure: Left Heart Cath and Coronary Angiography;  Surgeon: Lennette Bihari, MD;  [facility];  Service: Cardiovascular;  Laterality: N/A;   TEE WITHOUT CARDIOVERSION N/A 02/24/2015   Procedure: TRANSESOPHAGEAL ECHOCARDIOGRAM (TEE);  Surgeon: Chilton Si, MD;  [facility];  Service: Cardiovascular;  Laterality: N/A;   TEE WITHOUT CARDIOVERSION N/A 12/09/2015   Procedure: TRANSESOPHAGEAL ECHOCARDIOGRAM (TEE);  Surgeon: Vesta Mixer, MD;  [facility];  Service: Cardiovascular;  Laterality: N/A;       Home Medications    Prior to Admission medications   Medication Sig Start Date End Date Taking? Authorizing Provider  erythromycin ophthalmic ointment Place into the right eye 4 (four) times daily for 7 days. Place a 1/2 inch ribbon of ointment into the lower eyelid. 04/28/23 05/05/23 Yes Valentino Nose, NP  methadone (DOLOPHINE) 10 MG tablet Take 10 mg by mouth every 8 (eight) hours.   Yes [provider]    Family History Family History  Adopted: Yes    Social History Social History   Tobacco Use   Smoking status: Every Day    Current packs/day: 1.00    Types: Cigarettes   Smokeless tobacco: Never   Tobacco comments:    12/05/2015 "haven't  had a cigarette in 2 wks"  Substance Use Topics   Alcohol use: Yes    Comment: 12/05/2015 "don't drink now"   Drug use: Yes    Types: Cocaine, Marijuana, IV    Comment: marijuana, heroin     Allergies   Penicillins and Sulfa antibiotics   Review of Systems Review of Systems Per HPI  Physical Exam Triage Vital Signs ED Triage Vitals  Encounter Vitals Group     BP 04/28/23 1323 96/64     Systolic BP Percentile --      Diastolic BP Percentile --      Pulse Rate 04/28/23 1323 67     Resp 04/28/23 1323 14     Temp 04/28/23 1323  98.2 F (36.8 C)     Temp Source 04/28/23 1323 Oral     SpO2 04/28/23 1323 97 %     Weight --      Height --      Head Circumference --      Peak Flow --      Pain Score 04/28/23 1322 0     Pain Loc --      Pain Education --      Exclude from Growth Chart --    No data found.  Updated Vital Signs BP 96/64 (BP Location: Left Arm)   Pulse 67   Temp 98.2 F (36.8 C) (Oral)   Resp 14   SpO2 97%   Visual Acuity Right Eye Distance: 20/70 Left Eye Distance: 20/25 Bilateral Distance: 20/25  Right Eye Near:   Left Eye Near:    Bilateral Near:     Physical Exam Vitals and nursing note reviewed.  Constitutional:      General: He is not in acute distress.    Appearance: Normal appearance. He is not toxic-appearing.  HENT:     Head: Normocephalic and atraumatic.     Right Ear: External ear normal.     Left Ear: External ear normal.     Nose: Nose normal. No congestion or rhinorrhea.     Mouth/Throat:     Mouth: Mucous membranes are moist.     Pharynx: Oropharynx is clear.  Eyes:     General:        Right eye: Discharge present.        Left eye: No discharge.     Extraocular Movements: Extraocular movements intact.     Conjunctiva/sclera:     Right eye: No exudate.    Left eye: No exudate.    Pupils: Pupils are equal, round, and reactive to light.     Right eye: Corneal abrasion present.     Comments: Hordeolum no lid right upper external eye; corneal abrasion noted with fluorescein stain to 7 o'clock position of right eye  Pulmonary:     Effort: Pulmonary effort is normal. No respiratory distress.  Musculoskeletal:     Cervical back: Normal range of motion.  Lymphadenopathy:     Cervical: No cervical adenopathy.  Skin:    General: Skin is warm and dry.     Coloration: Skin is not jaundiced or pale.     Findings: No erythema.  Neurological:     Mental Status: He is alert and oriented to person, place, and time.  Psychiatric:        Behavior: Behavior is  cooperative.      UC Treatments / Results  Labs (all labs ordered are listed, but only abnormal results are displayed) Labs Reviewed - No  data to display  EKG   Radiology No results found.  Procedures Procedures (including critical care time)  Medications Ordered in UC Medications - No data to display  Initial Impression / Assessment and Plan / UC Course  I have reviewed the triage vital signs and the nursing notes.  Pertinent labs & imaging results that were available during my care of the patient were reviewed by me and considered in my medical decision making (see chart for details).   Patient is well-appearing, normotensive, afebrile, not tachycardic, not tachypneic, oxygenating well on room air.   1. Abrasion of right cornea, initial encounter 2. Hordeolum externum of right upper eyelid Will treat corneal abrasion with erythromycin ointment 4 times a day for 5 days Supportive care discussed for the hordeolum including warm compresses, avoid pushing at the area Return and ER precautions discussed with patient  The patient was given the opportunity to ask questions.  All questions answered to their satisfaction.  The patient is in agreement to this plan.    Final Clinical Impressions(s) / UC Diagnoses   Final diagnoses:  Abrasion of right cornea, initial encounter  Hordeolum externum of right upper eyelid     Discharge Instructions      You have a stye on your right upper eyelid as well as a corneal abrasion of your right cornea.  Start using the erythromycin ointment for 4 times daily for 7 days to treat it.  You can use warm compresses for the stye.  Seek care if symptoms do not improve with treatment.   ED Prescriptions     Medication Sig Dispense Auth. Provider   erythromycin ophthalmic ointment Place into the right eye 4 (four) times daily for 7 days. Place a 1/2 inch ribbon of ointment into the lower eyelid. 3.5 g Valentino Nose, NP      PDMP not  reviewed this encounter.   Valentino Nose, NP 04/28/23 [phone]

## 2023-04-28 NOTE — ED Triage Notes (Signed)
Pt had right eye swelling, redness and fells like something in it for 2 days.

## 2023-04-28 NOTE — Discharge Instructions (Addendum)
You have a stye on your right upper eyelid as well as a corneal abrasion of your right cornea.  Start using the erythromycin ointment for 4 times daily for 7 days to treat it.  You can use warm compresses for the stye.  Seek care if symptoms do not improve with treatment.

## 2023-06-08 ENCOUNTER — Observation Stay (HOSPITAL_COMMUNITY)
Admission: EM | Admit: 2023-06-08 | Discharge: 2023-06-09 | Discharge status: Against Medical Advice | Payer: MEDICAID | Attending: Internal Medicine | Admitting: Internal Medicine

## 2023-06-08 ENCOUNTER — Encounter (HOSPITAL_COMMUNITY): Payer: Self-pay | Admitting: Emergency Medicine

## 2023-06-08 ENCOUNTER — Emergency Department (HOSPITAL_COMMUNITY): Payer: MEDICAID

## 2023-06-08 ENCOUNTER — Ambulatory Visit (HOSPITAL_COMMUNITY)
Admission: EM | Admit: 2023-06-08 | Discharge: 2023-06-08 | Disposition: A | Discharge status: Home / Self Care | Payer: MEDICAID | Attending: Family Medicine | Admitting: Family Medicine

## 2023-06-08 ENCOUNTER — Other Ambulatory Visit: Payer: Self-pay

## 2023-06-08 DIAGNOSIS — Z8679 Personal history of other diseases of the circulatory system: Secondary | ICD-10-CM | POA: Diagnosis not present

## 2023-06-08 DIAGNOSIS — F1990 Other psychoactive substance use, unspecified, uncomplicated: Secondary | ICD-10-CM | POA: Diagnosis present

## 2023-06-08 DIAGNOSIS — F1120 Opioid dependence, uncomplicated: Secondary | ICD-10-CM | POA: Diagnosis not present

## 2023-06-08 DIAGNOSIS — L0390 Cellulitis, unspecified: Secondary | ICD-10-CM | POA: Diagnosis present

## 2023-06-08 DIAGNOSIS — I071 Rheumatic tricuspid insufficiency: Secondary | ICD-10-CM | POA: Diagnosis present

## 2023-06-08 DIAGNOSIS — F17210 Nicotine dependence, cigarettes, uncomplicated: Secondary | ICD-10-CM | POA: Diagnosis present

## 2023-06-08 DIAGNOSIS — A419 Sepsis, unspecified organism: Principal | ICD-10-CM | POA: Diagnosis present

## 2023-06-08 DIAGNOSIS — F1420 Cocaine dependence, uncomplicated: Secondary | ICD-10-CM | POA: Diagnosis not present

## 2023-06-08 DIAGNOSIS — B1910 Unspecified viral hepatitis B without hepatic coma: Secondary | ICD-10-CM | POA: Diagnosis not present

## 2023-06-08 DIAGNOSIS — F1920 Other psychoactive substance dependence, uncomplicated: Secondary | ICD-10-CM | POA: Diagnosis present

## 2023-06-08 DIAGNOSIS — N5089 Other specified disorders of the male genital organs: Secondary | ICD-10-CM

## 2023-06-08 DIAGNOSIS — E860 Dehydration: Secondary | ICD-10-CM | POA: Diagnosis not present

## 2023-06-08 DIAGNOSIS — N492 Inflammatory disorders of scrotum: Secondary | ICD-10-CM | POA: Diagnosis not present

## 2023-06-08 DIAGNOSIS — E871 Hypo-osmolality and hyponatremia: Secondary | ICD-10-CM | POA: Diagnosis not present

## 2023-06-08 DIAGNOSIS — I252 Old myocardial infarction: Secondary | ICD-10-CM

## 2023-06-08 DIAGNOSIS — Z880 Allergy status to penicillin: Secondary | ICD-10-CM

## 2023-06-08 DIAGNOSIS — Z1152 Encounter for screening for COVID-19: Secondary | ICD-10-CM

## 2023-06-08 DIAGNOSIS — R590 Localized enlarged lymph nodes: Secondary | ICD-10-CM | POA: Insufficient documentation

## 2023-06-08 DIAGNOSIS — Z882 Allergy status to sulfonamides status: Secondary | ICD-10-CM

## 2023-06-08 DIAGNOSIS — Z5329 Procedure and treatment not carried out because of patient's decision for other reasons: Secondary | ICD-10-CM | POA: Diagnosis present

## 2023-06-08 DIAGNOSIS — Z8619 Personal history of other infectious and parasitic diseases: Secondary | ICD-10-CM | POA: Insufficient documentation

## 2023-06-08 LAB — CBC WITH DIFFERENTIAL/PLATELET
Abs Immature Granulocytes: 0.04 10*3/uL (ref 0.00–0.07)
Basophils Absolute: 0 10*3/uL (ref 0.0–0.1)
Basophils Relative: 0 %
Eosinophils Absolute: 0.2 10*3/uL (ref 0.0–0.5)
Eosinophils Relative: 2 %
HCT: 41.7 % (ref 39.0–52.0)
Hemoglobin: 13.7 g/dL (ref 13.0–17.0)
Immature Granulocytes: 0 %
Lymphocytes Relative: 15 %
Lymphs Abs: 1.6 10*3/uL (ref 0.7–4.0)
MCH: 28.6 pg (ref 26.0–34.0)
MCHC: 32.9 g/dL (ref 30.0–36.0)
MCV: 87.1 fL (ref 80.0–100.0)
Monocytes Absolute: 0.7 10*3/uL (ref 0.1–1.0)
Monocytes Relative: 7 %
Neutro Abs: 7.8 10*3/uL — ABNORMAL HIGH (ref 1.7–7.7)
Neutrophils Relative %: 76 %
Platelets: 212 10*3/uL (ref 150–400)
RBC: 4.79 MIL/uL (ref 4.22–5.81)
RDW: 11.9 % (ref 11.5–15.5)
WBC: 10.3 10*3/uL (ref 4.0–10.5)
nRBC: 0 % (ref 0.0–0.2)

## 2023-06-08 LAB — COMPREHENSIVE METABOLIC PANEL WITH GFR
ALT: 34 U/L (ref 0–44)
AST: 42 U/L — ABNORMAL HIGH (ref 15–41)
Albumin: 3.7 g/dL (ref 3.5–5.0)
Alkaline Phosphatase: 60 U/L (ref 38–126)
Anion gap: 11 (ref 5–15)
BUN: 13 mg/dL (ref 6–20)
CO2: 24 mmol/L (ref 22–32)
Calcium: 8.7 mg/dL — ABNORMAL LOW (ref 8.9–10.3)
Chloride: 96 mmol/L — ABNORMAL LOW (ref 98–111)
Creatinine, Ser: 0.89 mg/dL (ref 0.61–1.24)
GFR, Estimated: >60 mL/min (ref >60)
Glucose, Bld: 101 mg/dL — ABNORMAL HIGH (ref 70–99)
Potassium: 3.6 mmol/L (ref 3.5–5.1)
Sodium: 131 mmol/L — ABNORMAL LOW (ref 135–145)
Total Bilirubin: 1.1 mg/dL (ref 0.0–1.2)
Total Protein: 8.4 g/dL — ABNORMAL HIGH (ref 6.5–8.1)

## 2023-06-08 LAB — APTT: aPTT: 31 s (ref 24–36)

## 2023-06-08 LAB — RESP PANEL BY RT-PCR (RSV, FLU A&B, COVID)  RVPGX2
Influenza A by PCR: NEGATIVE
Influenza B by PCR: NEGATIVE
Resp Syncytial Virus by PCR: NEGATIVE
SARS Coronavirus 2 by RT PCR: NEGATIVE

## 2023-06-08 LAB — I-STAT CG4 LACTIC ACID, ED: Lactic Acid, Venous: 1.1 mmol/L (ref 0.5–1.9)

## 2023-06-08 LAB — PROTIME-INR
INR: 1.2 (ref 0.8–1.2)
Prothrombin Time: 15.1 s (ref 11.4–15.2)

## 2023-06-08 MED ORDER — METRONIDAZOLE 500 MG/100ML IV SOLN
500.0000 mg | Freq: Once | INTRAVENOUS | Status: AC
  Administered 2023-06-09: 500 mg via INTRAVENOUS
  Filled 2023-06-08: qty 100

## 2023-06-08 MED ORDER — HYDROMORPHONE HCL 1 MG/ML IJ SOLN
0.5000 mg | Freq: Once | INTRAMUSCULAR | Status: AC
  Administered 2023-06-08: 0.5 mg via INTRAVENOUS
  Filled 2023-06-08: qty 1

## 2023-06-08 MED ORDER — AZTREONAM 2 G IJ SOLR
2.0000 g | Freq: Once | INTRAMUSCULAR | Status: AC
  Administered 2023-06-08: 2 g via INTRAVENOUS
  Filled 2023-06-08: qty 10

## 2023-06-08 MED ORDER — VANCOMYCIN HCL IN DEXTROSE 1-5 GM/200ML-% IV SOLN
1000.0000 mg | Freq: Once | INTRAVENOUS | Status: AC
  Administered 2023-06-09: 1000 mg via INTRAVENOUS
  Filled 2023-06-08: qty 200

## 2023-06-08 MED ORDER — LACTATED RINGERS IV BOLUS (SEPSIS)
1000.0000 mL | Freq: Once | INTRAVENOUS | Status: AC
  Administered 2023-06-08: 1000 mL via INTRAVENOUS

## 2023-06-08 MED ORDER — IOHEXOL 300 MG/ML  SOLN
100.0000 mL | Freq: Once | INTRAMUSCULAR | Status: AC | PRN
  Administered 2023-06-08: 100 mL via INTRAVENOUS

## 2023-06-08 MED ORDER — NICOTINE 21 MG/24HR TD PT24
21.0000 mg | MEDICATED_PATCH | Freq: Once | TRANSDERMAL | Status: DC
  Administered 2023-06-09: 21 mg via TRANSDERMAL
  Filled 2023-06-08: qty 1

## 2023-06-08 MED ORDER — LACTATED RINGERS IV SOLN: INTRAVENOUS | Status: DC

## 2023-06-08 NOTE — ED Notes (Signed)
 Patient is being discharged from the Urgent Care and sent to the Emergency Department via pov . Per dr hagler, patient is in need of higher level of care due to testicular pain/swelling/limited resources. Patient is aware and verbalizes understanding of plan of care.  Vitals:   06/08/23 1834  BP: 103/70  Pulse: 85  Resp: 16  Temp: 98 F (36.7 C)  SpO2: 98%

## 2023-06-08 NOTE — ED Triage Notes (Signed)
 Patient reports lymph node in groin on left side started swelling about 2 weeks ago, and then last night, he noticed both testicles swollen, left more than the right, worse swelling today.  Per UC, patient has been told he has been in contact with someone with syphilis. Patient denies any drainage/discharge, problems with urination.

## 2023-06-08 NOTE — ED Provider Notes (Signed)
 [facility] [place] [facility] Provider Note MRN:  [id number]  Arrival date & time: 06/09/23     Chief Complaint   Groin Swelling   History of Present Illness   Erik [name] is a 28 y.o. year-old male presents to the ED with chief complaint of scrotal swelling and tenderness.  He reports significant swelling over the past day or 2.  Also reports left inguinal lymphadenopathy and tenderness.  He states that the left groin pain started about 1 week ago.  He denies fevers or chills, but is noted to be febrile to 100.6 degrees in triage.  He denies having taken anything for symptoms.  Denies any dysuria or penile discharge.  He reports that he is able to urinate normally.  Denies any difficulty retracting foreskin.  He is not sexually active.  Possible syphilis exposure.  History provided by patient.   Review of Systems  Pertinent positive and negative review of systems noted in HPI.    Physical Exam   Vitals:   06/09/23 0230 06/09/23 0247  BP: (!) 99/54   Pulse: 73   Resp: 15   Temp:  99.6 F (37.6 C)  SpO2: 99%     CONSTITUTIONAL:  non toxic-appearing, NAD NEURO:  Alert and oriented x 3, CN 3-12 grossly intact EYES:  eyes equal and reactive ENT/NECK:  Supple, no stridor  CARDIO:  normal rate, regular rhythm, appears well-perfused  PULM:  No respiratory distress, CTAB GI/GU:  non-distended, non tender, softball sized scrotum with associated tenderness and some minor discharge, there is significant left inguinal induration and tenderness MSK/SPINE:  No gross deformities, no edema, moves all extremities  SKIN:  no rash, atraumatic   *Additional and/or pertinent findings included in MDM below  Diagnostic and Interventional Summary    EKG Interpretation Date/Time:  Wednesday June 08 2023 22:18:37 EDT Ventricular Rate:  73 PR Interval:  175 QRS Duration:  101 QT Interval:  362 QTC Calculation: 399 R Axis:   60  Text Interpretation: Sinus  rhythm Confirmed by Kristine Royal [phone]) on 06/08/2023 10:23:02 PM       Labs Reviewed  COMPREHENSIVE METABOLIC PANEL WITH GFR - Abnormal; Notable for the following components:      Result Value   Sodium 131 (*)    Chloride 96 (*)    Glucose, Bld 101 (*)    Calcium 8.7 (*)    Total Protein 8.4 (*)    AST 42 (*)    All other components within normal limits  CBC WITH DIFFERENTIAL/PLATELET - Abnormal; Notable for the following components:   Neutro Abs 7.8 (*)    All other components within normal limits  RAPID URINE DRUG SCREEN, HOSP PERFORMED - Abnormal; Notable for the following components:   Cocaine POSITIVE (*)    All other components within normal limits  RESP PANEL BY RT-PCR (RSV, FLU A&B, COVID)  RVPGX2  CULTURE, BLOOD (ROUTINE X 2)  CULTURE, BLOOD (ROUTINE X 2)  PROTIME-INR  APTT  URINALYSIS, W/ REFLEX TO CULTURE (INFECTION SUSPECTED)  HIV ANTIBODY (ROUTINE TESTING W REFLEX)  RPR  I-STAT CG4 LACTIC ACID, ED  I-STAT CG4 LACTIC ACID, ED  GC/CHLAMYDIA PROBE AMP ([place]) NOT AT [facility]    CT ABDOMEN PELVIS W CONTRAST  Final Result    US SCROTUM W/DOPPLER  Final Result    DG Chest Port 1 View  Final Result      Medications  lactated ringers infusion ( Intravenous New Bag/Given 06/09/23 0008)  nicotine (NICODERM  CQ - dosed in mg/24 hours) patch 21 mg (21 mg Transdermal Patch Applied 06/09/23 0004)  lactated ringers bolus 1,000 mL (0 mLs Intravenous Stopped 06/08/23 2318)    And  lactated ringers bolus 1,000 mL (0 mLs Intravenous Stopped 06/09/23 0003)  aztreonam (AZACTAM) 2 g in sodium chloride 0.9 % 100 mL IVPB (0 g Intravenous Stopped 06/09/23 0003)  metroNIDAZOLE (FLAGYL) IVPB 500 mg (0 mg Intravenous Stopped 06/09/23 0238)  vancomycin (VANCOCIN) IVPB 1000 mg/200 mL premix (0 mg Intravenous Stopped 06/09/23 0238)  HYDROmorphone (DILAUDID) injection 0.5 mg (0.5 mg Intravenous Given 06/08/23 2243)  iohexol (OMNIPAQUE) 300 MG/ML solution 100 mL (100 mLs Intravenous Contrast  Given 06/08/23 2344)     Procedures  /  Critical Care .Critical Care  Performed by: Roxy Horseman, PA-C Authorized by: Roxy Horseman, PA-C   Critical care provider statement:    Critical care time (minutes):  48   Critical care was necessary to treat or prevent imminent or life-threatening deterioration of the following conditions:  Sepsis   Critical care was time spent personally by me on the following activities:  Development of treatment plan with patient or surrogate, discussions with consultants, evaluation of patient's response to treatment, examination of patient, ordering and review of laboratory studies, ordering and review of radiographic studies, ordering and performing treatments and interventions, pulse oximetry, re-evaluation of patient's condition and review of old charts   ED Course and Medical Decision Making  I have reviewed the triage vital signs, the nursing notes, and pertinent available records from the EMR.  Social Determinants Affecting Complexity of Care: Patient has no clinically significant social determinants affecting this chief complaint..   ED Course:    Medical Decision Making Patient here with fever, mildly soft blood pressures of 101/51.  Has had significant groin swelling and scrotal swelling that is worsened over the past week.  He is tender to palpation.  I am concerned about infection and abscess.  Will check labs and CT and ultrasound.  Will start broad-spectrum antibiotics.  Code sepsis activated.  CT and ultrasound are consistent with scrotal cellulitis.  No evidence of fluid collection or abscess.  Will consult hospitalist for observation admission.  Amount and/or Complexity of Data Reviewed Labs: ordered. Radiology: ordered.  Risk OTC drugs. Prescription drug management. Decision regarding hospitalization.         Consultants: I consulted with Hospitalist, Dr. Toniann Fail, who is appreciated for admitting.   Treatment and  Plan: Patient's exam and diagnostic results are concerning for scrotal cellulitis.  Feel that patient will need admission to the hospital for further treatment and evaluation.    Final Clinical Impressions(s) / ED Diagnoses     ICD-10-CM   1. Scrotal swelling  N50.89     2. Cellulitis, unspecified cellulitis site  L03.90       ED Discharge Orders     None         Discharge Instructions Discussed with and Provided to Patient:   Discharge Instructions   None      Roxy Horseman, PA-C 06/09/23 1914    Wynetta Fines, MD 06/09/23 [phone]

## 2023-06-08 NOTE — Sepsis Progress Note (Signed)
 Elink monitoring for the code sepsis protocol.

## 2023-06-08 NOTE — ED Provider Notes (Signed)
 [facility]   [id number] 06/08/23 Arrival Time: 1652  ASSESSMENT & PLAN:  1. Scrotal swelling    Significant swelling of scrotum; unclear cause. To ED for further evaluation.  Reviewed expectations re: course of current medical issues. Questions answered. Outlined signs and symptoms indicating need for more acute intervention. Patient verbalized understanding. After Visit Summary given.   SUBJECTIVE:  Erik [name] is a 28 y.o. male who presents with complaint of scrotal swelling; abrupt onset; today; affecting gait; mild pain. Is sexually active. Denies urinary symptoms/penile discharge. No h/o similar. Denies scrotal trauma.   OBJECTIVE:  Vitals:   06/08/23 1834  BP: 103/70  Pulse: 85  Resp: 16  Temp: 98 F (36.7 C)  TempSrc: Oral  SpO2: 98%    General appearance: alert, cooperative, appears stated age and no distress Abdomen: soft, non-tender GU: significant scrotal swelling; taught and firm; difficult exam secondary to swelling Skin: warm and dry Psychological: alert and cooperative; normal mood and affect.    Labs Reviewed - No data to display  Allergies  Allergen Reactions   Penicillins Swelling    Childhood allergy Has patient had a PCN reaction causing immediate rash, facial/tongue/throat swelling, SOB or lightheadedness with hypotension: Yes Has patient had a PCN reaction causing severe rash involving mucus membranes or skin necrosis: Unknown Has patient had a PCN reaction that required hospitalization: Unknown Has patient had a PCN reaction occurring within the last 10 years:No If all of the above answers are "NO", then may proceed with Cephalosporin use.    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   Anxiety    Drug abuse and dependence (HCC)    admit  with OD 11/2014: rhabdo and AKI, ARF -    Hepatitis B    chronic viral/hx per notes 12/05/2015   History of endocarditis    LESCHBER, VALERIE A [[id number]]   IV drug user    heroine   NSTEMI (non-ST elevated myocardial infarction) (HCC)    Hattie Perch 12/05/2015   Pneumonia 12/04/2015   Septic pulmonary embolism (HCC) 12/07/2015   Sinusitis    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   Family History  Adopted: Yes   Social History   Socioeconomic History   Marital status: Single    Spouse name: Not on file   Number of children: 0   Years of education: Not on file   Highest education level: Not on file  Occupational History   Not on file  Tobacco Use   Smoking status: Former    Current packs/day: 1.00    Types: Cigarettes   Smokeless tobacco: Never   Tobacco comments:    12/05/2015 "haven't had a cigarette in 2 wks"  Vaping Use   Vaping status: Every Day  Substance and Sexual Activity   Alcohol use: Yes    Comment: 12/05/2015 "don't drink now"   Drug use: Yes    Types: Cocaine, Marijuana, IV    Comment: marijuana, heroin   Sexual activity: Yes    Comment: heroine  Other Topics Concern   Not on file  Social History Narrative   ** Merged History Encounter **       ** Merged History Encounter **     Social Drivers of Corporate investment banker Strain: Not on file  Food Insecurity: Not on file  Transportation Needs: Not on file  Physical Activity: Not on file  Stress: Not on file  Social Connections: Not on file  Intimate Partner Violence: Not on file           Mardella Layman, MD 06/08/23 [phone]

## 2023-06-08 NOTE — ED Triage Notes (Signed)
 Testicular swelling, left side was sore 2 weeks ago.  Patient noticed swelling last night, but this morning swelling was significantly worse.    Has not had any medications for this issue  Patient also has been told a contact had syphilis.    Patient has no issues with urination.  Denies penile discharge

## 2023-06-09 ENCOUNTER — Encounter (HOSPITAL_COMMUNITY): Payer: Self-pay | Admitting: Internal Medicine

## 2023-06-09 DIAGNOSIS — L03818 Cellulitis of other sites: Secondary | ICD-10-CM

## 2023-06-09 DIAGNOSIS — Z8679 Personal history of other diseases of the circulatory system: Secondary | ICD-10-CM | POA: Diagnosis not present

## 2023-06-09 DIAGNOSIS — F17210 Nicotine dependence, cigarettes, uncomplicated: Secondary | ICD-10-CM | POA: Diagnosis present

## 2023-06-09 DIAGNOSIS — A419 Sepsis, unspecified organism: Secondary | ICD-10-CM | POA: Diagnosis present

## 2023-06-09 DIAGNOSIS — F1990 Other psychoactive substance use, unspecified, uncomplicated: Secondary | ICD-10-CM

## 2023-06-09 DIAGNOSIS — Z5329 Procedure and treatment not carried out because of patient's decision for other reasons: Secondary | ICD-10-CM | POA: Diagnosis present

## 2023-06-09 DIAGNOSIS — Z880 Allergy status to penicillin: Secondary | ICD-10-CM | POA: Diagnosis not present

## 2023-06-09 DIAGNOSIS — Z882 Allergy status to sulfonamides status: Secondary | ICD-10-CM | POA: Diagnosis not present

## 2023-06-09 DIAGNOSIS — L0390 Cellulitis, unspecified: Secondary | ICD-10-CM | POA: Diagnosis present

## 2023-06-09 DIAGNOSIS — E871 Hypo-osmolality and hyponatremia: Secondary | ICD-10-CM | POA: Diagnosis present

## 2023-06-09 DIAGNOSIS — I071 Rheumatic tricuspid insufficiency: Secondary | ICD-10-CM | POA: Diagnosis present

## 2023-06-09 DIAGNOSIS — I252 Old myocardial infarction: Secondary | ICD-10-CM | POA: Diagnosis not present

## 2023-06-09 DIAGNOSIS — F1920 Other psychoactive substance dependence, uncomplicated: Secondary | ICD-10-CM | POA: Diagnosis present

## 2023-06-09 DIAGNOSIS — Z1152 Encounter for screening for COVID-19: Secondary | ICD-10-CM | POA: Diagnosis not present

## 2023-06-09 DIAGNOSIS — N5089 Other specified disorders of the male genital organs: Secondary | ICD-10-CM

## 2023-06-09 DIAGNOSIS — N492 Inflammatory disorders of scrotum: Secondary | ICD-10-CM | POA: Diagnosis present

## 2023-06-09 LAB — CBC WITH DIFFERENTIAL/PLATELET
Abs Immature Granulocytes: 0.02 10*3/uL (ref 0.00–0.07)
Basophils Absolute: 0 10*3/uL (ref 0.0–0.1)
Basophils Relative: 0 %
Eosinophils Absolute: 0.1 10*3/uL (ref 0.0–0.5)
Eosinophils Relative: 1 %
HCT: 32.1 % — ABNORMAL LOW (ref 39.0–52.0)
Hemoglobin: 11 g/dL — ABNORMAL LOW (ref 13.0–17.0)
Immature Granulocytes: 0 %
Lymphocytes Relative: 12 %
Lymphs Abs: 1.1 10*3/uL (ref 0.7–4.0)
MCH: 29.3 pg (ref 26.0–34.0)
MCHC: 34.3 g/dL (ref 30.0–36.0)
MCV: 85.6 fL (ref 80.0–100.0)
Monocytes Absolute: 0.6 10*3/uL (ref 0.1–1.0)
Monocytes Relative: 7 %
Neutro Abs: 7.1 10*3/uL (ref 1.7–7.7)
Neutrophils Relative %: 80 %
Platelets: 165 10*3/uL (ref 150–400)
RBC: 3.75 MIL/uL — ABNORMAL LOW (ref 4.22–5.81)
RDW: 11.9 % (ref 11.5–15.5)
WBC: 9 10*3/uL (ref 4.0–10.5)
nRBC: 0 % (ref 0.0–0.2)

## 2023-06-09 LAB — HEPATIC FUNCTION PANEL
ALT: 27 U/L (ref 0–44)
AST: 32 U/L (ref 15–41)
Albumin: 2.7 g/dL — ABNORMAL LOW (ref 3.5–5.0)
Alkaline Phosphatase: 49 U/L (ref 38–126)
Bilirubin, Direct: 0.2 mg/dL (ref 0.0–0.2)
Indirect Bilirubin: 0.6 mg/dL (ref 0.3–0.9)
Total Bilirubin: 0.8 mg/dL (ref 0.0–1.2)
Total Protein: 6.3 g/dL — ABNORMAL LOW (ref 6.5–8.1)

## 2023-06-09 LAB — URINALYSIS, W/ REFLEX TO CULTURE (INFECTION SUSPECTED)
Bacteria, UA: NONE SEEN
Bilirubin Urine: NEGATIVE
Glucose, UA: NEGATIVE mg/dL
Hgb urine dipstick: NEGATIVE
Ketones, ur: NEGATIVE mg/dL
Leukocytes,Ua: NEGATIVE
Nitrite: NEGATIVE
Protein, ur: NEGATIVE mg/dL
Specific Gravity, Urine: 1.024 (ref 1.005–1.030)
pH: 5 (ref 5.0–8.0)

## 2023-06-09 LAB — RAPID URINE DRUG SCREEN, HOSP PERFORMED
Amphetamines: NOT DETECTED
Barbiturates: NOT DETECTED
Benzodiazepines: NOT DETECTED
Cocaine: POSITIVE — AB
Opiates: NOT DETECTED
Tetrahydrocannabinol: NOT DETECTED

## 2023-06-09 LAB — BASIC METABOLIC PANEL WITH GFR
Anion gap: 7 (ref 5–15)
BUN: 10 mg/dL (ref 6–20)
CO2: 24 mmol/L (ref 22–32)
Calcium: 8.1 mg/dL — ABNORMAL LOW (ref 8.9–10.3)
Chloride: 101 mmol/L (ref 98–111)
Creatinine, Ser: 0.66 mg/dL (ref 0.61–1.24)
GFR, Estimated: >60 mL/min (ref >60)
Glucose, Bld: 118 mg/dL — ABNORMAL HIGH (ref 70–99)
Potassium: 3.4 mmol/L — ABNORMAL LOW (ref 3.5–5.1)
Sodium: 132 mmol/L — ABNORMAL LOW (ref 135–145)

## 2023-06-09 LAB — MRSA NEXT GEN BY PCR, NASAL: MRSA by PCR Next Gen: NOT DETECTED

## 2023-06-09 LAB — RPR: RPR Ser Ql: NONREACTIVE

## 2023-06-09 LAB — HIV ANTIBODY (ROUTINE TESTING W REFLEX): HIV Screen 4th Generation wRfx: NONREACTIVE

## 2023-06-09 MED ORDER — METHADONE HCL 10 MG PO TABS
10.0000 mg | ORAL_TABLET | Freq: Three times a day (TID) | ORAL | Status: DC
  Administered 2023-06-09: 10 mg via ORAL
  Filled 2023-06-09: qty 1

## 2023-06-09 MED ORDER — LACTATED RINGERS IV SOLN: INTRAVENOUS | Status: DC

## 2023-06-09 MED ORDER — ACETAMINOPHEN 325 MG PO TABS
650.0000 mg | ORAL_TABLET | Freq: Four times a day (QID) | ORAL | Status: DC | PRN
  Administered 2023-06-09: 650 mg via ORAL
  Filled 2023-06-09: qty 2

## 2023-06-09 MED ORDER — VANCOMYCIN HCL IN DEXTROSE 1-5 GM/200ML-% IV SOLN
1000.0000 mg | Freq: Two times a day (BID) | INTRAVENOUS | Status: DC
  Administered 2023-06-09: 1000 mg via INTRAVENOUS
  Filled 2023-06-09: qty 200

## 2023-06-09 MED ORDER — ACETAMINOPHEN 650 MG RE SUPP: 650.0000 mg | Freq: Four times a day (QID) | RECTAL | Status: DC | PRN

## 2023-06-09 MED ORDER — CEFAZOLIN SODIUM-DEXTROSE 2-4 GM/100ML-% IV SOLN
2.0000 g | Freq: Three times a day (TID) | INTRAVENOUS | Status: DC
  Administered 2023-06-09: 2 g via INTRAVENOUS
  Filled 2023-06-09 (×2): qty 100

## 2023-06-09 MED ORDER — METHADONE HCL 10 MG/ML PO CONC: 40.0000 mg | Freq: Every day | ORAL | Status: DC

## 2023-06-09 MED ORDER — BUPRENORPHINE HCL-NALOXONE HCL 2-0.5 MG SL SUBL: 1.0000 | SUBLINGUAL_TABLET | SUBLINGUAL | Status: DC | PRN

## 2023-06-09 MED ORDER — POTASSIUM CHLORIDE 20 MEQ PO PACK
20.0000 meq | PACK | Freq: Once | ORAL | Status: AC
  Administered 2023-06-09: 20 meq via ORAL
  Filled 2023-06-09: qty 1

## 2023-06-09 MED ORDER — BUPRENORPHINE HCL-NALOXONE HCL 8-2 MG SL SUBL: 1.0000 | SUBLINGUAL_TABLET | Freq: Two times a day (BID) | SUBLINGUAL | Status: DC

## 2023-06-09 MED ORDER — SODIUM CHLORIDE 0.9 % IV SOLN
2.0000 g | Freq: Three times a day (TID) | INTRAVENOUS | Status: DC
  Administered 2023-06-09: 2 g via INTRAVENOUS
  Filled 2023-06-09 (×3): qty 10

## 2023-06-09 MED ORDER — METHADONE HCL 10 MG/ML PO CONC: 90.0000 mg | Freq: Every morning | ORAL | Status: DC

## 2023-06-09 MED ORDER — KETOROLAC TROMETHAMINE 15 MG/ML IJ SOLN
15.0000 mg | Freq: Four times a day (QID) | INTRAMUSCULAR | Status: DC | PRN
  Administered 2023-06-09: 15 mg via INTRAVENOUS
  Filled 2023-06-09: qty 1

## 2023-06-09 MED ORDER — LACTATED RINGERS IV BOLUS
500.0000 mL | Freq: Once | INTRAVENOUS | Status: AC
  Administered 2023-06-09: 500 mL via INTRAVENOUS

## 2023-06-09 NOTE — H&P (Addendum)
 History and Physical    Erik [name] [name]:[id number] DOB: January 05, 1996 DOA: 06/08/2023  Patient coming from: Home.  Chief Complaint: Scrotal pain.  HPI: Erik [name] is a 28 y.o. male with history of IV drug abuse last injected fentanyl yesterday on upper extremity with prior history of tricuspid valve MSSA endocarditis in 2017 treated with antibiotics presents to the ER with complaints of severe scrotal pain since yesterday morning.  Patient states his symptoms started off suddenly.  Denies any trauma.  Last sexual contact was with his known friend about 2 weeks ago.  Patient has some difficulty urinating.  Also noticed tender nodes on the left inguinal area.  Denies any joint pains skin rash other than the scrotal swelling and no discharge from the urethra.  Denies shortness of breath or chest pain.  ED Course: In the ER patient was initially hypotensive and tachycardic was given fluid bolus for sepsis.  Started on empiric antibiotics for sepsis after cultures obtained.  On exam patient's scrotum was tender to touch and also there is inflamed left inguinal node which is tender to touch.  Ultrasound of the scrotum and CT scan of abdomen pelvis shows features concerning for scrotal cellulitis with no involvement of the testicles.  Reactive inguinal adenopathy is seen.  Labs show sodium of 131 AST 42 lactic acid 1.1.  Review of Systems: As per HPI, rest all negative.   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    Past Surgical History:  Procedure Laterality Date   CARDIAC CATHETERIZATION N/A 02/22/2015   Procedure: Left Heart Cath and Coronary Angiography;  Surgeon: Lennette Bihari, MD;  Location: [facility] INVASIVE CV LAB;  Service: Cardiovascular;  Laterality: N/A;   TEE WITHOUT CARDIOVERSION N/A 02/24/2015   Procedure: TRANSESOPHAGEAL ECHOCARDIOGRAM (TEE);  Surgeon: Chilton Si, MD;  Location: [facility] ENDOSCOPY;  Service: Cardiovascular;  Laterality: N/A;   TEE WITHOUT CARDIOVERSION N/A 12/09/2015   Procedure: TRANSESOPHAGEAL ECHOCARDIOGRAM (TEE);  Surgeon: Vesta Mixer, MD;  Location: [facility] ENDOSCOPY;  Service: Cardiovascular;  Laterality: N/A;     reports that he has quit smoking. His smoking use included cigarettes. He has never used smokeless tobacco. He reports current alcohol use. He reports current drug use. Drugs: Cocaine, Marijuana, and IV.  Allergies  Allergen Reactions   Penicillins Swelling    Childhood allergy Has patient had a PCN reaction causing immediate rash, facial/tongue/throat swelling, SOB or lightheadedness with hypotension: Yes Has patient had a PCN reaction causing severe rash involving mucus membranes or skin necrosis: Unknown Has patient had a PCN reaction that required hospitalization: Unknown Has patient had a PCN reaction occurring within the last 10 years:No If all of the above answers are "NO", then may proceed with Cephalosporin use.    Sulfa Antibiotics Swelling and Rash    Childhood allergic reaction    Family History  Adopted: Yes  Prior to Admission medications   Medication Sig Start Date End Date Taking? Authorizing Provider  methadone (DOLOPHINE) 10 MG tablet Take 10 mg by mouth every 8 (eight) hours.   Yes [provider]    Physical Exam: Constitutional: Moderately built and nourished. Vitals:   06/09/23 0215 06/09/23 0230 06/09/23 0247 06/09/23 0345  BP: (!) 97/51 (!) 99/54  (!) 148/99   Pulse: 75 73  78  Resp: 15 15  11   Temp:   99.6 F (37.6 C)   TempSrc:   Oral   SpO2: 97% 99%  100%  Weight:       Eyes: Anicteric no pallor. ENMT: No discharge from the ears eyes nose or mouth. Neck: No mass felt.  No neck rigidity. Respiratory: No rhonchi or crepitations. Cardiovascular: S1-S2 heard. Abdomen: Swollen scrotum and penis with no obvious ulcers on the penis. Musculoskeletal: No edema. Skin: Swollen scrotum.  Penile swelling.  No ulcers on the penis. Neurologic: Alert awake oriented time place and person.  Moves all extremities. Psychiatric: Appears normal.  Normal affect.   Labs on Admission: I have personally reviewed following labs and imaging studies  CBC: Recent Labs  Lab 06/08/23 2245  WBC 10.3  NEUTROABS 7.8*  HGB 13.7  HCT 41.7  MCV 87.1  PLT 212   Basic Metabolic Panel: Recent Labs  Lab 06/08/23 2245  NA 131*  K 3.6  CL 96*  CO2 24  GLUCOSE 101*  BUN 13  CREATININE 0.89  CALCIUM 8.7*   GFR: CrCl cannot be calculated (Unknown ideal weight.). Liver Function Tests: Recent Labs  Lab 06/08/23 2245  AST 42*  ALT 34  ALKPHOS 60  BILITOT 1.1  PROT 8.4*  ALBUMIN 3.7   No results for input(s): "LIPASE", "AMYLASE" in the last 168 hours. No results for input(s): "AMMONIA" in the last 168 hours. Coagulation Profile: Recent Labs  Lab 06/08/23 2245  INR 1.2   Cardiac Enzymes: No results for input(s): "CKTOTAL", "CKMB", "CKMBINDEX", "TROPONINI" in the last 168 hours. BNP (last 3 results) No results for input(s): "PROBNP" in the last 8760 hours. HbA1C: No results for input(s): "HGBA1C" in the last 72 hours. CBG: No results for input(s): "GLUCAP" in the last 168 hours. Lipid Profile: No results for input(s): "CHOL", "HDL", "LDLCALC", "TRIG", "CHOLHDL", "LDLDIRECT" in the last 72 hours. Thyroid Function Tests: No results for input(s): "TSH", "T4TOTAL", "FREET4", "T3FREE", "THYROIDAB" in the last 72 hours. Anemia Panel: No results  for input(s): "VITAMINB12", "FOLATE", "FERRITIN", "TIBC", "IRON", "RETICCTPCT" in the last 72 hours. Urine analysis:    Component Value Date/Time   COLORURINE YELLOW 06/09/2023 0005   APPEARANCEUR CLEAR 06/09/2023 0005   APPEARANCEUR Clear 02/27/2014 0351   LABSPEC 1.024 06/09/2023 0005   LABSPEC 1.025 02/27/2014 0351   PHURINE 5.0 06/09/2023 0005   GLUCOSEU NEGATIVE 06/09/2023 0005   GLUCOSEU Negative 02/27/2014 0351   HGBUR NEGATIVE 06/09/2023 0005   BILIRUBINUR NEGATIVE 06/09/2023 0005   BILIRUBINUR Negative 02/27/2014 0351   KETONESUR NEGATIVE 06/09/2023 0005   PROTEINUR NEGATIVE 06/09/2023 0005   UROBILINOGEN 0.2 11/12/2014 2124   NITRITE NEGATIVE 06/09/2023 0005   LEUKOCYTESUR NEGATIVE 06/09/2023 0005   LEUKOCYTESUR Negative 02/27/2014 0351   Sepsis Labs: @LABRCNTIP (procalcitonin:4,lacticidven:4) ) Recent Results (from the past 240 hours)  Resp panel by RT-PCR (RSV, Flu A&B, Covid) Urine, Random     Status: None   Collection Time: 06/08/23 10:56 PM   Specimen: Urine, Random; Nasal Swab  Result Value Ref Range Status   SARS Coronavirus 2 by  RT PCR NEGATIVE NEGATIVE Final    Comment: (NOTE) SARS-CoV-2 target nucleic acids are NOT DETECTED.  The SARS-CoV-2 RNA is generally detectable in upper respiratory specimens during the acute phase of infection. The lowest concentration of SARS-CoV-2 viral copies this assay can detect is 138 copies/mL. A negative result does not preclude SARS-Cov-2 infection and should not be used as the sole basis for treatment or other patient management decisions. A negative result may occur with  improper specimen collection/handling, submission of specimen other than nasopharyngeal swab, presence of viral mutation(s) within the areas targeted by this assay, and inadequate number of viral copies(<138 copies/mL). A negative result must be combined with clinical observations, patient history, and epidemiological information. The expected result  is Negative.  Fact Sheet for Patients:  BloggerCourse.com  Fact Sheet for Healthcare Providers:  SeriousBroker.it  This test is no t yet approved or cleared by the [place] FDA and  has been authorized for detection and/or diagnosis of SARS-CoV-2 by FDA under an Emergency Use Authorization (EUA). This EUA will remain  in effect (meaning this test can be used) for the duration of the COVID-19 declaration under Section 564(b)(1) of the Act, 21 U.S.C.section 360bbb-3(b)(1), unless the authorization is terminated  or revoked sooner.       Influenza A by PCR NEGATIVE NEGATIVE Final   Influenza B by PCR NEGATIVE NEGATIVE Final    Comment: (NOTE) The Xpert Xpress SARS-CoV-2/FLU/RSV plus assay is intended as an aid in the diagnosis of influenza from Nasopharyngeal swab specimens and should not be used as a sole basis for treatment. Nasal washings and aspirates are unacceptable for Xpert Xpress SARS-CoV-2/FLU/RSV testing.  Fact Sheet for Patients: BloggerCourse.com  Fact Sheet for Healthcare Providers: SeriousBroker.it  This test is not yet approved or cleared by the [place] FDA and has been authorized for detection and/or diagnosis of SARS-CoV-2 by FDA under an Emergency Use Authorization (EUA). This EUA will remain in effect (meaning this test can be used) for the duration of the COVID-19 declaration under Section 564(b)(1) of the Act, 21 U.S.C. section 360bbb-3(b)(1), unless the authorization is terminated or revoked.     Resp Syncytial Virus by PCR NEGATIVE NEGATIVE Final    Comment: (NOTE) Fact Sheet for Patients: BloggerCourse.com  Fact Sheet for Healthcare Providers: SeriousBroker.it  This test is not yet approved or cleared by the [place] FDA and has been authorized for detection and/or diagnosis of  SARS-CoV-2 by FDA under an Emergency Use Authorization (EUA). This EUA will remain in effect (meaning this test can be used) for the duration of the COVID-19 declaration under Section 564(b)(1) of the Act, 21 U.S.C. section 360bbb-3(b)(1), unless the authorization is terminated or revoked.  Performed at [facility], [address] [place], [place] [address]      Radiological Exams on Admission: CT ABDOMEN PELVIS W CONTRAST Result Date: 06/09/2023 CLINICAL DATA:  Scrotal swelling and groin swelling EXAM: CT ABDOMEN AND PELVIS WITH CONTRAST TECHNIQUE: Multidetector CT imaging of the abdomen and pelvis was performed using the standard protocol following bolus administration of intravenous contrast. RADIATION DOSE REDUCTION: This exam was performed according to the departmental dose-optimization program which includes automated exposure control, adjustment of the mA and/or kV according to patient size and/or use of iterative reconstruction technique. CONTRAST:  OMNIPAQUE IOHEXOL 300 MG/ML  SOLN COMPARISON:  Scrotal ultrasound 06/08/2023 and CT 02/27/2014 FINDINGS: Lower chest: No acute abnormality. Hepatobiliary: No focal liver abnormality is seen. No gallstones, gallbladder wall thickening, or biliary  dilatation. Pancreas: Unremarkable. Spleen: Unremarkable. Adrenals/Urinary Tract: Normal adrenal glands. No urinary calculi or hydronephrosis. Unremarkable bladder. Stomach/Bowel: Normal caliber large and small bowel. No bowel wall thickening. Stomach is within normal limits. Appendix is normal. Vascular/Lymphatic: No significant vascular findings are present. Bilateral inguinal lymphadenopathy with the largest nodes measuring 1.6 cm on the left and 1.2 cm on the right. Reproductive: Prostate is unremarkable. Marked scrotal edema similar to same day ultrasound. No abscess or subcutaneous gas. Other: No free intraperitoneal fluid or air. Musculoskeletal: No acute fracture.  IMPRESSION: 1. Marked scrotal edema similar to same day ultrasound. No abscess or subcutaneous gas. 2. Bilateral inguinal lymphadenopathy, likely reactive. Electronically Signed   By: Minerva Fester M.D.   On: 06/09/2023 00:01   US SCROTUM W/DOPPLER Result Date: 06/08/2023 CLINICAL DATA:  Scrotal swelling for 1 day EXAM: SCROTAL ULTRASOUND DOPPLER ULTRASOUND OF THE TESTICLES TECHNIQUE: Complete ultrasound examination of the testicles, epididymis, and other scrotal structures was performed. Color and spectral Doppler ultrasound were also utilized to evaluate blood flow to the testicles. COMPARISON:  None Available. FINDINGS: Right testicle Measurements: 3.8 x 2.5 x 2.3 cm. No mass or microlithiasis visualized. Left testicle Measurements: 3.2 x 2.5 x 2.4 cm. No mass or microlithiasis visualized. Right epididymis:  Normal in size and appearance. Left epididymis:  Normal in size and appearance. Hydrocele:  None visualized. Varicocele:  None visualized. Pulsed Doppler interrogation of both testes demonstrates normal low resistance arterial and venous waveforms bilaterally. Other: Bilateral skin thickening measuring 2.6 cm on the right and 2.9 cm on the left. No abscess or organized fluid collection. IMPRESSION: Unremarkable appearance of the testicles.  No torsion. Marked scrotal skin thickening/edema may be due to volume status or cellulitis. No abscess. Electronically Signed   By: Minerva Fester M.D.   On: 06/08/2023 22:53   DG Chest Port 1 View Result Date: 06/08/2023 CLINICAL DATA:  Questionable sepsis-evaluate for abnormality. Swelling in testicles. Contact with someone with cephalus. EXAM: PORTABLE CHEST 1 VIEW COMPARISON:  12/04/2015 FINDINGS: Stable cardiomediastinal silhouette. No focal consolidation, pleural effusion, or pneumothorax. No displaced rib fractures. IMPRESSION: No active disease. Electronically Signed   By: Minerva Fester M.D.   On: 06/08/2023 22:31    EKG: Independently reviewed.  Normal  sinus rhythm.  Assessment/Plan Principal Problem:   Cellulitis Active Problems:   Polysubstance dependence (HCC)   IVDU (intravenous drug user)   Sepsis (HCC)    Sepsis from scrotal cellulitis -     CT scan and ultrasound does not show any definite signs of Fournier's gangrene.  Will keep patient on vancomycin and Azactam.  Patient is allergic to penicillin.  Follow cultures continue hydration.  Consulted urologist IV drug abuse advised about quitting.  Had injected fentanyl yesterday.  Follow cultures.  Social work consult. On methadone therapy. Prior history of tricuspid valve endocarditis with MSSA in 2017. Hyponatremia could be from dehydration.  Recheck after hydration. History of hepatitis B as per the chart.  AST is mildly elevated.  Since patient has sepsis with scrotal cellulitis will need close monitoring and more than 2 midnight stay.  Patient states the goal with home he had sexual contact was possibly treated for syphilis will be checking RPR.   DVT prophylaxis: SCDs for now until we make sure patient does not require any surgery for the scrotal cellulitis. Code Status: Full code. Family Communication: Discussed with patient. Disposition Plan: Progressive care. Consults called: consulted Urologist. Admission status: Inpatient.

## 2023-06-09 NOTE — Progress Notes (Signed)
 Pharmacy Antibiotic Note  Erik [name] is a 28 y.o. male admitted on 06/08/2023 with  scrotal swelling and tenderness.  Pharmacy has been consulted to dose vancomycin and aztreonam for cellulitis. 1st doses given in the  ED  Plan: Vancomycin 1gm IV q12h (AUC 527.7, Scr 0.89, TBW) Aztreonam 2gm IV q8h Follow renal function and clinical course  Height: 5\' 6"  (167.6 cm) Weight: 59 kg (130 lb 1.1 oz) IBW/kg (Calculated) : 63.8  Temp (24hrs), Avg:99.8 F (37.7 C), Min:98 F (36.7 C), Max:100.6 F (38.1 C)  Recent Labs  Lab 06/08/23 2244 06/08/23 2245  WBC  --  10.3  CREATININE  --  0.89  LATICACIDVEN 1.1  --     Estimated Creatinine Clearance: 102.2 mL/min (by C-G formula based on SCr of 0.89 mg/dL).    Allergies  Allergen Reactions   Penicillins Swelling    Childhood allergy Has patient had a PCN reaction causing immediate rash, facial/tongue/throat swelling, SOB or lightheadedness with hypotension: Yes Has patient had a PCN reaction causing severe rash involving mucus membranes or skin necrosis: Unknown Has patient had a PCN reaction that required hospitalization: Unknown Has patient had a PCN reaction occurring within the last 10 years:No If all of the above answers are "NO", then may proceed with Cephalosporin use.    Sulfa Antibiotics Swelling and Rash    Childhood allergic reaction    Antimicrobials this admission: 4/3 vanc >> 4/3 aztreonam >>  Dose adjustments this admission:   Microbiology results: 4/2 BCx:   Thank you for allowing pharmacy to be a part of this patient's care.  Arley Phenix RPh 06/09/2023, 4:32 AM

## 2023-06-09 NOTE — Progress Notes (Addendum)
 Patient admitted after midnight please see H&P.  Here with scrotal cellulitis.  Says it has been easier to urinate this AM   A/P:  Sepsis from scrotal cellulitis -     - CT scan and ultrasound does not show any definite signs of Fournier's gangrene.  - vancomycin and Azactam.  Patient has childhood allergy to PCN-- but has tolerated in the past 2017 etc so will re-challange   -Follow cultures  -Consulted urology -GC probe pending  IV drug abuse advised about quitting.  Had injected fentanyl yesterday.   -Follow cultures.   -Social work consult. -not On methadone therapy.-- will change to suboxone  Prior history of tricuspid valve endocarditis with MSSA in 2017.  Hyponatremia -improving with hydration  History of hepatitis B as per the chart.  AST is mildly elevated.  Marlin Canary DO

## 2023-06-09 NOTE — Progress Notes (Signed)
 Patient leaving AMA. Signed AMA paper. Ambulated out of unit independently.   Patient stated he felt his swelling was 50% improved and he was urinating fine, and felt he did not need to be in the hospital any more. He also said his pain was improving. He stated he had "personal" reason as to why he wanted to leave.

## 2023-06-09 NOTE — Progress Notes (Signed)
 Pharmacy Note- Penicillin Allergy Clarification   ASSESSMENT: Patient's mother had previously reported that when patient was 28 y/o he had lower lip swelling after receiving penicillin. Spoke with patient on 4/03. He said that he does not have a penicillin allergy - notes he has taken amoxicillin in the past. Also, patient was on Zosyn in 2016 and there was no noted allergy or insensitivity.    PEN-FAST Scoring   Five years or less since last reaction 0  Anaphylaxis/Angioedema OR Severe cutaneous adverse reaction  0  Treatment required for reaction  0  Total Score 0 points - Very low risk of positive penicillin allergy test (<1%)      Impact on therapy (select all that apply):  Penicillin allergy removed   PLAN: - switch from aztreonam to cefazolin  Thank you for allowing pharmacy to be a part of this patient's care.   Lyn Henri, [facility] PharmD Candidate 06/09/2023 10:24 AM

## 2023-06-09 NOTE — Plan of Care (Signed)

## 2023-06-09 NOTE — Sepsis Progress Note (Signed)
 Notified provider of need to administer fluid bolus and vasopressors. Most recent blood pressure x2 better.

## 2023-06-09 NOTE — Progress Notes (Signed)
   06/09/23 1153  Spiritual Encounters  Type of Visit Declined chaplain visit  Referral source Nurse (RN/NT/LPN)  Reason for visit Urgent spiritual support  OnCall Visit No   Attempted to visit patient for spiritual assessment, however, patient declined.

## 2023-06-09 NOTE — Progress Notes (Signed)
 Pt left AMA. Refused to speak with hospitalist prior to leaving, stated, " I don't really want to talk with her to be honest." Risks of leaving against medical reviewed with patient by charge nurse with acknowledgement. IV removed. Pt ambulated independently off the unit.

## 2023-06-09 NOTE — TOC Initial Note (Signed)
 Transition of Care [facility]) - Initial/Assessment Note    Patient Details  Name: Erik [name] MRN: [id number] Date of Birth: 1995-04-28  [facility] CM/SW Contact:    Larrie Kass, LCSW Phone Number: 06/09/2023, 2:27 PM  Clinical Narrative:                  CSW received a consult for substance use resources. CSW spoke with the patient, who adamantly declined the resources and disconnected the call. TOC will sign off at this time.    Expected Discharge Plan: Home/Self Care Barriers to Discharge: Continued Medical Work up   Patient Goals and CMS Choice            Expected Discharge Plan and Services                                              Prior Living Arrangements/Services              Need for Family Participation in Patient Care: No (Comment) Care giver support system in place?: No (comment)      Activities of Daily Living   ADL Screening (condition at time of admission) Independently performs ADLs?: Yes (appropriate for developmental age) Is the patient deaf or have difficulty hearing?: No Does the patient have difficulty seeing, even when wearing glasses/contacts?: No Does the patient have difficulty concentrating, remembering, or making decisions?: No  Permission Sought/Granted                  Emotional Assessment   Attitude/Demeanor/Rapport: Engaged Affect (typically observed): Frustrated Orientation: : Oriented to Self, Oriented to Place, Oriented to  Time, Oriented to Situation Alcohol / Substance Use: Illicit Drugs    Admission diagnosis:  Cellulitis [L03.90] Scrotal swelling [N50.89] Sepsis (HCC) [A41.9] Cellulitis, unspecified cellulitis site [L03.90] Patient Active Problem List   Diagnosis Date Noted   Cellulitis 06/09/2023   Sepsis (HCC) 06/09/2023   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   Septic pulmonary embolism (HCC) 12/07/2015   Staphylococcus aureus  bacteremia 12/06/2015   Thrombocytopenia (HCC) 12/05/2015   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   Cocaine abuse (HCC)    Acute renal failure due to rhabdomyolysis (HCC) 02/22/2015   NSTEMI (non-ST elevated myocardial infarction) (HCC)    Traumatic rhabdomyolysis (HCC) 11/10/2014   Viral hepatitis B chronic (HCC) 11/10/2014   IVDU (intravenous drug user)    Polysubstance dependence (HCC) 11/16/2013   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 [place] [address] Phone: [phone] Fax: [phone]  [facility] #[id number] [place], [place] - [address] AT [facility] OF [facility] & [address] [place] [address] Phone: [phone] Fax: [phone]     Social Drivers of Health (SDOH) Social History: SDOH Screenings   Food Insecurity: No Food Insecurity (06/09/2023)  Housing: Low Risk  (06/09/2023)  Transportation Needs: No Transportation Needs (06/09/2023)  Utilities: Not At Risk (06/09/2023)  Tobacco Use: Medium Risk (06/09/2023)   SDOH Interventions:     Readmission  Risk Interventions     No data to display

## 2023-06-09 NOTE — Consult Note (Signed)
 Urology Consult Note   Requesting Attending Physician:  Joseph Art, DO Service Providing Consult: Urology  Consulting Attending: Dr. Liliane Shi   Reason for Consult scrotal cellulitis  HPI: Erik [name] is seen in consultation for reasons noted above at the request of Dr. Toniann Fail. 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 Presenting to [facility] ED with complaint of severe inguinal scrotal pain. Imaging shows no gas or fluid collection. He reports having unproptected sex with a male partner a couple of weeks ago "who he was told had syphilis" . He experienced left inguinal lymphadenopathy about 5 days later. He had mild swelling yesterday and awoke with significant swelling yesterday. All of his pain appears to be in the inguinal lymph node. He denies sharing needles but does occasionally reuse them. He last injected fentanyl and smoked cocaine yesterday. Urinating without difficulty.  ------------------  Assessment:  28 y.o. male with scrotal cellulitis   Recommendations: #scrotal swelling Nothing actionable in his imaging- no gas or fluid, clear UA, agree with broad treatment for scrotal cellulitis.  His swollen lymph nodes are consistent with his suspected syphilis and may be hindering lymph drainage. No obvious chancre observed, but could easily be hidden by his penile edema. Ordered T.Pallidum test. Should be covered by present ABX if (+). Will follow peripherally and can see him outpt.     Media Information      Case and plan discussed with Dr. 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    Past Surgical History:  Past Surgical History:  Procedure Laterality Date   CARDIAC CATHETERIZATION N/A 02/22/2015   Procedure: Left Heart Cath and Coronary Angiography;  Surgeon: Lennette Bihari, MD;  [facility] IN[facility]AS[facility] LAB;  Service: Cardiovascular;  Laterality: N/A;   TEE WITHOUT CARDIOVERSION N/A 02/24/2015   Procedure: TRANSESOPHAGEAL ECHOCARDIOGRAM (TEE);  Surgeon: Chilton Si, MD;  Location: [facility] ENDOSCOPY;  Service: Cardiovascular;  Laterality: N/A;   TEE WITHOUT CARDIOVERSION N/A 12/09/2015   Procedure: TRANSESOPHAGEAL ECHOCARDIOGRAM (TEE);  Surgeon: Vesta Mixer, MD;  Location: [facility] ENDOSCOPY;  Service: Cardiovascular;  Laterality: N/A;    Medication: Current Facility-Administered Medications  Medication Dose Route Frequency Provider Last Rate Last Admin   acetaminophen (TYLENOL) tablet 650 mg  650 mg Oral Q6H PRN Eduard Clos, MD       Or   acetaminophen (TYLENOL) suppository 650 mg  650 mg Rectal Q6H PRN Eduard Clos, MD       buprenorphine-naloxone (SUBOXONE) 2-0.5 mg per SL tablet 1 tablet  1 tablet Sublingual PRN Vann, Jessica U, DO       [START ON 06/10/2023] buprenorphine-naloxone (SUBOXONE) 8-2 mg per SL tablet 1 tablet  1 tablet Sublingual BID Vann, Jessica U, DO       ceFAZolin (ANCEF) IVPB 2g/100 mL premix  2 g Intravenous Q8H Vann, Jessica U, DO       lactated ringers infusion   Intravenous Continuous Eduard Clos, MD 100 mL/hr at 06/09/23 0443 New Bag at 06/09/23 0443   nicotine (NICODERM CQ - dosed in mg/24 hours) patch 21 mg  21 mg Transdermal Once Roxy Horseman, PA-C   21 mg at 06/09/23 0004   vancomycin (VANCOCIN) IVPB 1000 mg/200 mL premix  1,000 mg Intravenous Q12H Maurice March, [facility]         Allergies: Allergies  Allergen Reactions   Sulfa Antibiotics Swelling and Rash    Childhood allergic reaction    Social History: Social History   Tobacco Use   Smoking status: Former    Current packs/day: 1.00    Types: Cigarettes   Smokeless tobacco: Never   Tobacco comments:    12/05/2015 "haven't had a cigarette in 2 wks"  Vaping Use   Vaping status: Every Day  Substance Use Topics   Alcohol use: Yes    Comment: 12/05/2015 "don't drink now"   Drug use: Yes    Types: Cocaine, Marijuana, IV    Comment: marijuana, heroin    Family History Family History  Adopted: Yes    ROS   Objective   Vital signs in last 24 hours: BP 118/68   Pulse 66   Temp 99.1 F (37.3 C) (Oral)   Resp 16   Ht 5\' 6"  (1.676 m)   Wt 59 kg   SpO2 100%   BMI 20.99 kg/m   Physical Exam General: A&O, resting, appropriate HEENT: [place]/AT Pulmonary: Normal work of breathing Cardiovascular: no cyanosis Abdomen: Soft, NTTP, nondistended GU: inguinal lymph node swelling with pain, significant penile/scrotal edema Neuro: Appropriate, no focal neurological deficits  Most Recent Labs: Lab Results  Component Value Date   WBC 9.0 06/09/2023   HGB 11.0 (L) 06/09/2023   HCT 32.1 (L) 06/09/2023   PLT 165 06/09/2023    Lab Results  Component Value Date   NA 132 (L) 06/09/2023   K 3.4 (L) 06/09/2023   CL 101 06/09/2023   CO2 24 06/09/2023   BUN 10 06/09/2023   CREATININE 0.66 06/09/2023   CALCIUM 8.1 (L) 06/09/2023   MG 2.0 12/09/2015   PHOS 6.3 (H) 11/15/2014    Lab Results  Component Value Date   INR 1.2 06/08/2023   APTT 31 06/08/2023     Urine Culture: @[id number] (laburin,org,[id number],[id number])@   IMAGING: CT ABDOMEN PELVIS W CONTRAST Result Date: 06/09/2023 CLINICAL DATA:  Scrotal swelling and groin swelling EXAM: CT ABDOMEN AND PELVIS WITH CONTRAST TECHNIQUE: Multidetector CT imaging of the abdomen and pelvis was performed using the standard protocol following bolus  administration of intravenous contrast. RADIATION DOSE REDUCTION: This exam was performed according to the departmental dose-optimization program which includes automated exposure control, adjustment of the mA and/or kV according to patient size and/or use of iterative reconstruction technique. CONTRAST:  OMNIPAQUE IOHEXOL 300 MG/ML  SOLN COMPARISON:  Scrotal ultrasound 06/08/2023 and CT 02/27/2014 FINDINGS: Lower chest: No acute abnormality. Hepatobiliary: No focal liver abnormality is seen. No gallstones, gallbladder wall thickening, or biliary dilatation. Pancreas: Unremarkable. Spleen: Unremarkable. Adrenals/Urinary Tract: Normal adrenal glands. No urinary calculi or hydronephrosis. Unremarkable bladder. Stomach/Bowel: Normal caliber large and small bowel. No bowel wall thickening. Stomach is within normal limits. Appendix is normal. Vascular/Lymphatic: No significant vascular findings are present. Bilateral inguinal lymphadenopathy with the largest nodes measuring 1.6 cm on the left and 1.2 cm on the right. Reproductive: Prostate is  unremarkable. Marked scrotal edema similar to same day ultrasound. No abscess or subcutaneous gas. Other: No free intraperitoneal fluid or air. Musculoskeletal: No acute fracture. IMPRESSION: 1. Marked scrotal edema similar to same day ultrasound. No abscess or subcutaneous gas. 2. Bilateral inguinal lymphadenopathy, likely reactive. Electronically Signed   By: Minerva Fester M.D.   On: 06/09/2023 00:01   US SCROTUM W/DOPPLER Result Date: 06/08/2023 CLINICAL DATA:  Scrotal swelling for 1 day EXAM: SCROTAL ULTRASOUND DOPPLER ULTRASOUND OF THE TESTICLES TECHNIQUE: Complete ultrasound examination of the testicles, epididymis, and other scrotal structures was performed. Color and spectral Doppler ultrasound were also utilized to evaluate blood flow to the testicles. COMPARISON:  None Available. FINDINGS: Right testicle Measurements: 3.8 x 2.5 x 2.3 cm. No mass or microlithiasis  visualized. Left testicle Measurements: 3.2 x 2.5 x 2.4 cm. No mass or microlithiasis visualized. Right epididymis:  Normal in size and appearance. Left epididymis:  Normal in size and appearance. Hydrocele:  None visualized. Varicocele:  None visualized. Pulsed Doppler interrogation of both testes demonstrates normal low resistance arterial and venous waveforms bilaterally. Other: Bilateral skin thickening measuring 2.6 cm on the right and 2.9 cm on the left. No abscess or organized fluid collection. IMPRESSION: Unremarkable appearance of the testicles.  No torsion. Marked scrotal skin thickening/edema may be due to volume status or cellulitis. No abscess. Electronically Signed   By: Minerva Fester M.D.   On: 06/08/2023 22:53   DG Chest Port 1 View Result Date: 06/08/2023 CLINICAL DATA:  Questionable sepsis-evaluate for abnormality. Swelling in testicles. Contact with someone with cephalus. EXAM: PORTABLE CHEST 1 VIEW COMPARISON:  12/04/2015 FINDINGS: Stable cardiomediastinal silhouette. No focal consolidation, pleural effusion, or pneumothorax. No displaced rib fractures. IMPRESSION: No active disease. Electronically Signed   By: Minerva Fester M.D.   On: 06/08/2023 22:31    ------  Elmon Kirschner, NP Pager: [phone]   Please contact the urology consult pager with any further questions/concerns.

## 2023-06-10 ENCOUNTER — Other Ambulatory Visit: Payer: Self-pay

## 2023-06-10 ENCOUNTER — Telehealth (HOSPITAL_BASED_OUTPATIENT_CLINIC_OR_DEPARTMENT_OTHER): Payer: Self-pay

## 2023-06-10 ENCOUNTER — Other Ambulatory Visit: Payer: Self-pay | Admitting: Internal Medicine

## 2023-06-10 ENCOUNTER — Encounter (HOSPITAL_COMMUNITY): Payer: Self-pay

## 2023-06-10 ENCOUNTER — Inpatient Hospital Stay (HOSPITAL_COMMUNITY)
Admission: EM | Admit: 2023-06-10 | Discharge: 2023-06-12 | DRG: 728 | Discharge status: Against Medical Advice | Payer: MEDICAID | Attending: Family Medicine | Admitting: Family Medicine

## 2023-06-10 DIAGNOSIS — I071 Rheumatic tricuspid insufficiency: Secondary | ICD-10-CM | POA: Diagnosis present

## 2023-06-10 DIAGNOSIS — D649 Anemia, unspecified: Secondary | ICD-10-CM | POA: Diagnosis present

## 2023-06-10 DIAGNOSIS — Z87891 Personal history of nicotine dependence: Secondary | ICD-10-CM | POA: Diagnosis not present

## 2023-06-10 DIAGNOSIS — D72819 Decreased white blood cell count, unspecified: Secondary | ICD-10-CM | POA: Diagnosis present

## 2023-06-10 DIAGNOSIS — F1120 Opioid dependence, uncomplicated: Secondary | ICD-10-CM | POA: Diagnosis present

## 2023-06-10 DIAGNOSIS — F1990 Other psychoactive substance use, unspecified, uncomplicated: Secondary | ICD-10-CM | POA: Diagnosis present

## 2023-06-10 DIAGNOSIS — Z8619 Personal history of other infectious and parasitic diseases: Secondary | ICD-10-CM

## 2023-06-10 DIAGNOSIS — Z8679 Personal history of other diseases of the circulatory system: Secondary | ICD-10-CM

## 2023-06-10 DIAGNOSIS — F1410 Cocaine abuse, uncomplicated: Secondary | ICD-10-CM | POA: Diagnosis present

## 2023-06-10 DIAGNOSIS — I252 Old myocardial infarction: Secondary | ICD-10-CM

## 2023-06-10 DIAGNOSIS — R7881 Bacteremia: Principal | ICD-10-CM | POA: Diagnosis present

## 2023-06-10 DIAGNOSIS — Z882 Allergy status to sulfonamides status: Secondary | ICD-10-CM

## 2023-06-10 DIAGNOSIS — Z86711 Personal history of pulmonary embolism: Secondary | ICD-10-CM | POA: Diagnosis not present

## 2023-06-10 DIAGNOSIS — Z5329 Procedure and treatment not carried out because of patient's decision for other reasons: Secondary | ICD-10-CM | POA: Diagnosis present

## 2023-06-10 DIAGNOSIS — I38 Endocarditis, valve unspecified: Secondary | ICD-10-CM | POA: Diagnosis not present

## 2023-06-10 DIAGNOSIS — F419 Anxiety disorder, unspecified: Secondary | ICD-10-CM | POA: Diagnosis present

## 2023-06-10 DIAGNOSIS — F1910 Other psychoactive substance abuse, uncomplicated: Secondary | ICD-10-CM | POA: Diagnosis present

## 2023-06-10 DIAGNOSIS — B9561 Methicillin susceptible Staphylococcus aureus infection as the cause of diseases classified elsewhere: Secondary | ICD-10-CM | POA: Diagnosis present

## 2023-06-10 DIAGNOSIS — N492 Inflammatory disorders of scrotum: Secondary | ICD-10-CM | POA: Diagnosis not present

## 2023-06-10 DIAGNOSIS — T50901A Poisoning by unspecified drugs, medicaments and biological substances, accidental (unintentional), initial encounter: Secondary | ICD-10-CM | POA: Diagnosis present

## 2023-06-10 LAB — BASIC METABOLIC PANEL WITH GFR
Anion gap: 10 (ref 5–15)
BUN: 11 mg/dL (ref 6–20)
CO2: 26 mmol/L (ref 22–32)
Calcium: 8.8 mg/dL — ABNORMAL LOW (ref 8.9–10.3)
Chloride: 105 mmol/L (ref 98–111)
Creatinine, Ser: 0.67 mg/dL (ref 0.61–1.24)
GFR, Estimated: >60 mL/min (ref >60)
Glucose, Bld: 90 mg/dL (ref 70–99)
Potassium: 3.5 mmol/L (ref 3.5–5.1)
Sodium: 141 mmol/L (ref 135–145)

## 2023-06-10 LAB — BLOOD CULTURE ID PANEL (REFLEXED) - BCID2

## 2023-06-10 LAB — CBC WITH DIFFERENTIAL/PLATELET
Abs Immature Granulocytes: 0.01 10*3/uL (ref 0.00–0.07)
Basophils Absolute: 0 10*3/uL (ref 0.0–0.1)
Basophils Relative: 0 %
Eosinophils Absolute: 0.2 10*3/uL (ref 0.0–0.5)
Eosinophils Relative: 4 %
HCT: 36.4 % — ABNORMAL LOW (ref 39.0–52.0)
Hemoglobin: 11.7 g/dL — ABNORMAL LOW (ref 13.0–17.0)
Immature Granulocytes: 0 %
Lymphocytes Relative: 34 %
Lymphs Abs: 2.1 10*3/uL (ref 0.7–4.0)
MCH: 28.5 pg (ref 26.0–34.0)
MCHC: 32.1 g/dL (ref 30.0–36.0)
MCV: 88.6 fL (ref 80.0–100.0)
Monocytes Absolute: 0.6 10*3/uL (ref 0.1–1.0)
Monocytes Relative: 9 %
Neutro Abs: 3.4 10*3/uL (ref 1.7–7.7)
Neutrophils Relative %: 53 %
Platelets: 236 10*3/uL (ref 150–400)
RBC: 4.11 MIL/uL — ABNORMAL LOW (ref 4.22–5.81)
RDW: 11.9 % (ref 11.5–15.5)
WBC: 6.3 10*3/uL (ref 4.0–10.5)
nRBC: 0 % (ref 0.0–0.2)

## 2023-06-10 LAB — GC/CHLAMYDIA PROBE AMP (~~LOC~~) NOT AT ARMC
Chlamydia: NEGATIVE
Comment: NEGATIVE
Comment: NORMAL
Neisseria Gonorrhea: NEGATIVE

## 2023-06-10 MED ORDER — CEFAZOLIN SODIUM-DEXTROSE 2-4 GM/100ML-% IV SOLN
2.0000 g | Freq: Three times a day (TID) | INTRAVENOUS | Status: DC
  Administered 2023-06-10 – 2023-06-12 (×7): 2 g via INTRAVENOUS
  Filled 2023-06-10 (×11): qty 100

## 2023-06-10 NOTE — ED Notes (Signed)
 Waiting for IV UTZ

## 2023-06-10 NOTE — Telephone Encounter (Signed)
 Post ED Visit - Positive Culture Follow-up: Successful Patient Follow-Up  Culture assessed and recommendations reviewed by:  []  Enzo Bi, Pharm.D. []  Celedonio Miyamoto, Pharm.D., BCPS AQ-ID []  Garvin Fila, Pharm.D., BCPS []  Georgina Pillion, Pharm.D., BCPS []  [place], [place].D., BCPS, AAHIVP []  Estella Husk, Pharm.D., BCPS, AAHIVP []  Lysle Pearl, PharmD, BCPS []  Phillips Climes, PharmD, BCPS []  Agapito Games, PharmD, BCPS []  Verlan Friends, PharmD  Positive blood culture  [x]  Patient discharged without antimicrobial prescription and treatment is now indicated []  Organism is resistant to prescribed ED discharge antimicrobial []  Patient with positive blood cultures  Plan: call pt and instruct pt to return to ED for treatment. Per Dr. Toniann Fail and Dr. Marlin Canary [facility] hopsitalist)  Pt returned call and instructed to return to the ED for treatment of positive blood cultures. Pt stated he would return.   Contacted patient, date 06/10/23, time 1:13 pm   Sandria Senter 06/10/2023, 1:15 PM

## 2023-06-10 NOTE — ED Provider Notes (Signed)
 [place] EMERGENCY DEPARTMENT AT [facility] Provider Note   CSN: [id number] Arrival date & time: 06/10/23  1825     History Chief Complaint  Patient presents with   Testicle Pain    HPI Erik [name] is a 28 y.o. male presenting for positive blood cultures. Was admitted to the hospital yesterday for scrotal cellulitis with MSSA positive blood culture resulting today. Was told to come back to the hospital for ongoing care and management   Patient's recorded medical, surgical, social, medication list and allergies were reviewed in the Snapshot window as part of the initial history.   Review of Systems   Review of Systems  Constitutional:  Positive for fever. Negative for chills.  HENT:  Negative for ear pain and sore throat.   Eyes:  Negative for pain and visual disturbance.  Respiratory:  Negative for cough and shortness of breath.   Cardiovascular:  Negative for chest pain and palpitations.  Gastrointestinal:  Negative for abdominal pain and vomiting.  Genitourinary:  Negative for dysuria and hematuria.  Musculoskeletal:  Negative for arthralgias and back pain.  Skin:  Negative for color change and rash.  Neurological:  Negative for seizures and syncope.  All other systems reviewed and are negative.   Physical Exam Updated Vital Signs BP 116/67   Pulse 80   Temp 97.8 F (36.6 C)   Resp 16   Ht 5\' 6"  (1.676 m)   Wt 63.5 kg   SpO2 100%   BMI 22.60 kg/m  Physical Exam Vitals and nursing note reviewed.  Constitutional:      General: He is not in acute distress.    Appearance: He is well-developed.  HENT:     Head: Normocephalic and atraumatic.  Eyes:     Conjunctiva/sclera: Conjunctivae normal.  Cardiovascular:     Rate and Rhythm: Normal rate and regular rhythm.     Heart sounds: No murmur heard. Pulmonary:     Effort: Pulmonary effort is normal. No respiratory distress.     Breath sounds: Normal breath sounds.  Abdominal:     Palpations:  Abdomen is soft.     Tenderness: There is no abdominal tenderness.  Musculoskeletal:        General: No swelling.     Cervical back: Neck supple.  Skin:    General: Skin is warm and dry.     Capillary Refill: Capillary refill takes less than 2 seconds.  Neurological:     Mental Status: He is alert.  Psychiatric:        Mood and Affect: Mood normal.      ED Course/ Medical Decision Making/ A&P    Procedures .Ultrasound ED Peripheral IV (Provider)  Date/Time: 06/10/2023 10:48 PM  Performed by: Glyn Ade, MD Authorized by: Glyn Ade, MD   Procedure details:    Indications: multiple failed IV attempts and poor IV access     Skin Prep: chlorhexidine gluconate     Location:  Right AC   Angiocath:  20 G   Bedside Ultrasound Guided: Yes     Images: archived     Patient tolerated procedure without complications: Yes     Dressing applied: Yes      Medications Ordered in ED Medications  ceFAZolin (ANCEF) IVPB 2g/100 mL premix (2 g Intravenous New Bag/Given 06/10/23 2258)    Medical Decision Making:   28 year old male with bacteremia. Personally got access as above due to difficult IV stick. Consulted pharmacy who recommended initiation of Ancef for  MSSA. Given his gram-positive bacteremia, patient will require admission to medicine for stabilization, consultation with infectious disease for definitive management.  He does have a history of endocarditis that may warrant repeat echocardiography. Patient in no acute distress at this time  Disposition:   Based on the above findings, I believe this patient is stable for admission.    Patient/family educated about specific findings on our evaluation and explained exact reasons for admission.  Patient/family educated about clinical situation and time was allowed to answer questions.   Admission team communicated with and agreed with need for admission. Patient admitted. Patient ready to move at this time.      Emergency Department Medication Summary:   Medications  ceFAZolin (ANCEF) IVPB 2g/100 mL premix (2 g Intravenous New Bag/Given 06/10/23 2258)        Clinical Impression:  1. Bacteremia      Admit   Final Clinical Impression(s) / ED Diagnoses Final diagnoses:  Bacteremia    Rx / DC Orders ED Discharge Orders     None         Glyn Ade, MD 06/10/23 2359

## 2023-06-10 NOTE — ED Notes (Signed)
 ED TO INPATIENT HANDOFF REPORT  ED Nurse Name and Phone #: Lorriane Shire RN  S Name/Age/Gender Erik [name] 28 y.o. male Room/Bed: WA09/WA09  Code Status   Code Status: Prior  Home/SNF/Other Home Patient oriented to: self, place, time, and situation Is this baseline? Yes   Triage Complete: Triage complete  Chief Complaint Cellulitis of scrotum [N49.2]  Triage Note Pt had infection testicles yesterday, pt left before treatment was complete. Had positive blood cultures and was notified to come back to ER. C/o pain in both testicles and some swelling   Allergies Allergies  Allergen Reactions   Sulfa Antibiotics Swelling and Rash    Childhood allergic reaction    Level of Care/Admitting Diagnosis ED Disposition     ED Disposition  Admit   Condition  --   Comment  Hospital Area: [facility] [[id number]]  Level of Care: Med-Surg [16]  May admit patient to [facility] or [facility] if equivalent level of care is available:: Yes  Covid Evaluation: Asymptomatic - no recent exposure (last 10 days) testing not required  Diagnosis: Cellulitis of scrotum [811914]  Admitting Physician: Rometta Emery [[id number]]  Attending Physician: Rometta Emery [[id number]]  Certification:: I certify this patient will need inpatient services for at least 2 midnights  Expected Medical Readiness: 06/13/2023          B Medical/Surgery History 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   Past Surgical History:  Procedure Laterality Date   CARDIAC CATHETERIZATION N/A 02/22/2015   Procedure: Left Heart Cath and Coronary Angiography;  Surgeon: Lennette Bihari, MD;  Location: [facility] INVASIVE CV LAB;  Service: Cardiovascular;  Laterality: N/A;   TEE WITHOUT CARDIOVERSION N/A 02/24/2015   Procedure: TRANSESOPHAGEAL ECHOCARDIOGRAM (TEE);  Surgeon: Chilton Si, MD;  Location: [facility] ENDOSCOPY;  Service: Cardiovascular;  Laterality: N/A;   TEE WITHOUT CARDIOVERSION N/A 12/09/2015   Procedure: TRANSESOPHAGEAL ECHOCARDIOGRAM (TEE);  Surgeon: Vesta Mixer, MD;  Location: [facility] ENDOSCOPY;  Service: Cardiovascular;  Laterality: N/A;     A IV Location/Drains/Wounds Patient Lines/Drains/Airways Status     Active Line/Drains/Airways     Name Placement date Placement time Site Days   Peripheral IV 06/10/23 20 G Anterior;Distal;Left;Upper Arm 06/10/23  2250  Arm  less than 1   Wound / Incision (Open or Dehisced) 10/31/15 Burn Hand Right 10/31/15  1730  Hand  2779   Wound / Incision (Open or Dehisced) 10/31/15 Other (Comment) Arm Left 10/31/15  1730  Arm  2779   Wound / Incision (Open or Dehisced) 10/31/15 Other (Comment) Knee Left 10/31/15  1730  Knee  2779            Intake/Output Last 24 hours No intake or output data in the 24 hours ending 06/10/23 2317  Labs/Imaging Results for orders placed or performed during the hospital encounter of 06/10/23 (from the  past 48 hours)  CBC with Differential     Status: Abnormal   Collection Time: 06/10/23 11:00 PM  Result Value Ref Range   WBC 6.3 4.0 - 10.5 K/uL   RBC 4.11 (L) 4.22 - 5.81 MIL/uL   Hemoglobin 11.7 (L) 13.0 - 17.0 g/dL   HCT 16.1 (L) 09.6 - 04.5 %   MCV 88.6 80.0 - 100.0 fL   MCH 28.5 26.0 - 34.0 pg   MCHC 32.1 30.0 - 36.0 g/dL   RDW 40.9 81.1 - 91.4 %   Platelets 236 150 - 400 K/uL   nRBC 0.0 0.0 - 0.2 %   Neutrophils Relative % 53 %   Neutro Abs 3.4 1.7 - 7.7 K/uL    Lymphocytes Relative 34 %   Lymphs Abs 2.1 0.7 - 4.0 K/uL   Monocytes Relative 9 %   Monocytes Absolute 0.6 0.1 - 1.0 K/uL   Eosinophils Relative 4 %   Eosinophils Absolute 0.2 0.0 - 0.5 K/uL   Basophils Relative 0 %   Basophils Absolute 0.0 0.0 - 0.1 K/uL   Immature Granulocytes 0 %   Abs Immature Granulocytes 0.01 0.00 - 0.07 K/uL    Comment: Performed at [facility], [address], [place], [place] [address]   CT ABDOMEN PELVIS W CONTRAST Result Date: 06/09/2023 CLINICAL DATA:  Scrotal swelling and groin swelling EXAM: CT ABDOMEN AND PELVIS WITH CONTRAST TECHNIQUE: Multidetector CT imaging of the abdomen and pelvis was performed using the standard protocol following bolus administration of intravenous contrast. RADIATION DOSE REDUCTION: This exam was performed according to the departmental dose-optimization program which includes automated exposure control, adjustment of the mA and/or kV according to patient size and/or use of iterative reconstruction technique. CONTRAST:  OMNIPAQUE IOHEXOL 300 MG/ML  SOLN COMPARISON:  Scrotal ultrasound 06/08/2023 and CT 02/27/2014 FINDINGS: Lower chest: No acute abnormality. Hepatobiliary: No focal liver abnormality is seen. No gallstones, gallbladder wall thickening, or biliary dilatation. Pancreas: Unremarkable. Spleen: Unremarkable. Adrenals/Urinary Tract: Normal adrenal glands. No urinary calculi or hydronephrosis. Unremarkable bladder. Stomach/Bowel: Normal caliber large and small bowel. No bowel wall thickening. Stomach is within normal limits. Appendix is normal. Vascular/Lymphatic: No significant vascular findings are present. Bilateral inguinal lymphadenopathy with the largest nodes measuring 1.6 cm on the left and 1.2 cm on the right. Reproductive: Prostate is unremarkable. Marked scrotal edema similar to same day ultrasound. No abscess or subcutaneous gas. Other: No free intraperitoneal fluid or air. Musculoskeletal: No acute  fracture. IMPRESSION: 1. Marked scrotal edema similar to same day ultrasound. No abscess or subcutaneous gas. 2. Bilateral inguinal lymphadenopathy, likely reactive. Electronically Signed   By: Minerva Fester M.D.   On: 06/09/2023 00:01    Pending Labs Unresulted Labs (From admission, onward)     Start     Ordered   06/10/23 2047  Basic metabolic panel  Once,   STAT        06/10/23 2047   06/10/23 2037  Blood culture (routine x 2)  BLOOD CULTURE X 2,   R (with STAT occurrences)      06/10/23 2037            Vitals/Pain Today's Vitals   06/10/23 1829 06/10/23 1837 06/10/23 2115  BP: 110/62  116/67  Pulse: 70  80  Resp: 16  16  Temp: 97.7 F (36.5 C)  97.8 F (36.6 C)  TempSrc: Oral    SpO2: 100%  100%  Weight:  63.5 kg   Height:  5\' 6"  (  1.676 m)   PainSc:  4      Isolation Precautions No active isolations  Medications Medications  ceFAZolin (ANCEF) IVPB 2g/100 mL premix (2 g Intravenous New Bag/Given 06/10/23 2258)    Mobility walks     Focused Assessments Cardiac Assessment Handoff:    Lab Results  Component Value Date   CKTOTAL 12 (L) 12/04/2015   CKMB 5.5 (H) 11/13/2014   TROPONINI <0.03 12/05/2015   No results found for: "DDIMER" Does the Patient currently have chest pain?   , Neuro Assessment Handoff:  Swallow screen pass?          Neuro Assessment:   Neuro Checks:      Has TPA been given?  If patient is a Neuro Trauma and patient is going to OR before floor call report to 4N Charge nurse: [phone] or [phone]  , Renal Assessment Handoff:  Hemodialysis Schedule:  Last Hemodialysis date and time:    Restricted appendage:    , Pulmonary Assessment Handoff:  Lung sounds:   O2 Device: Room Air      R Recommendations: See Admitting Provider Note  Report given to:   Additional Notes: this Pt is getting admitted because of testicle swelling and pos blood culture. Alertx4,Ambulatory, RA, SL.

## 2023-06-10 NOTE — ED Triage Notes (Signed)
 Pt had infection testicles yesterday, pt left before treatment was complete. Had positive blood cultures and was notified to come back to ER. C/o pain in both testicles and some swelling

## 2023-06-10 NOTE — ED Notes (Signed)
 No answer x1

## 2023-06-10 NOTE — Telephone Encounter (Signed)
 Post ED Visit - Positive Culture Follow-up: Unsuccessful Patient Follow-up  Culture assessed and recommendations reviewed by:  []  Enzo Bi, Pharm.D. []  Celedonio Miyamoto, Pharm.D., BCPS AQ-ID []  Garvin Fila, Pharm.D., BCPS []  Georgina Pillion, Pharm.D., BCPS []  [place], [address].D., BCPS, AAHIVP []  Estella Husk, Pharm.D., BCPS, AAHIVP []  Sherlynn Carbon, PharmD []  Pollyann Samples, PharmD, BCPS  Positive Blood culture  [x]  Patient discharged without antimicrobial prescription and treatment is now indicated []  Organism is resistant to prescribed ED discharge antimicrobial []  Patient with positive blood cultures  Pt left AMA without treatment.  Plan: return to ED for treatment per Dr. Toniann Fail and Dr. Marlin Canary [facility] hospitalists)   Unable to contact patient after 3 attempts, letter will be sent to address on file  Sandria Senter 06/10/2023, 12:37 PM

## 2023-06-11 ENCOUNTER — Inpatient Hospital Stay (HOSPITAL_COMMUNITY): Payer: MEDICAID

## 2023-06-11 DIAGNOSIS — I38 Endocarditis, valve unspecified: Secondary | ICD-10-CM

## 2023-06-11 DIAGNOSIS — R7881 Bacteremia: Secondary | ICD-10-CM

## 2023-06-11 DIAGNOSIS — D72819 Decreased white blood cell count, unspecified: Secondary | ICD-10-CM

## 2023-06-11 DIAGNOSIS — N492 Inflammatory disorders of scrotum: Principal | ICD-10-CM

## 2023-06-11 DIAGNOSIS — B9561 Methicillin susceptible Staphylococcus aureus infection as the cause of diseases classified elsewhere: Secondary | ICD-10-CM

## 2023-06-11 LAB — ECHOCARDIOGRAM COMPLETE
Area-P 1/2: 2.99 cm2
Calc EF: 48.3 %
Height: 66 in
S' Lateral: 3.1 cm
Single Plane A2C EF: 57.5 %
Single Plane A4C EF: 36.6 %
Weight: 2240 [oz_av]

## 2023-06-11 LAB — COMPREHENSIVE METABOLIC PANEL WITH GFR
ALT: 26 U/L (ref 0–44)
AST: 26 U/L (ref 15–41)
Albumin: 2.9 g/dL — ABNORMAL LOW (ref 3.5–5.0)
Alkaline Phosphatase: 48 U/L (ref 38–126)
Anion gap: 5 (ref 5–15)
BUN: 10 mg/dL (ref 6–20)
CO2: 27 mmol/L (ref 22–32)
Calcium: 8.8 mg/dL — ABNORMAL LOW (ref 8.9–10.3)
Chloride: 105 mmol/L (ref 98–111)
Creatinine, Ser: 0.68 mg/dL (ref 0.61–1.24)
GFR, Estimated: >60 mL/min (ref >60)
Glucose, Bld: 102 mg/dL — ABNORMAL HIGH (ref 70–99)
Potassium: 3.8 mmol/L (ref 3.5–5.1)
Sodium: 137 mmol/L (ref 135–145)
Total Bilirubin: 0.2 mg/dL (ref 0.0–1.2)
Total Protein: 6.7 g/dL (ref 6.5–8.1)

## 2023-06-11 LAB — CBC
HCT: 35.8 % — ABNORMAL LOW (ref 39.0–52.0)
Hemoglobin: 11.6 g/dL — ABNORMAL LOW (ref 13.0–17.0)
MCH: 28.6 pg (ref 26.0–34.0)
MCHC: 32.4 g/dL (ref 30.0–36.0)
MCV: 88.2 fL (ref 80.0–100.0)
Platelets: 218 10*3/uL (ref 150–400)
RBC: 4.06 MIL/uL — ABNORMAL LOW (ref 4.22–5.81)
RDW: 12.1 % (ref 11.5–15.5)
WBC: 3.8 10*3/uL — ABNORMAL LOW (ref 4.0–10.5)
nRBC: 0 % (ref 0.0–0.2)

## 2023-06-11 MED ORDER — SODIUM CHLORIDE 0.9 % IV SOLN
INTRAVENOUS | Status: AC
Start: 2023-06-11 — End: 2023-06-12

## 2023-06-11 MED ORDER — ONDANSETRON HCL 4 MG/2ML IJ SOLN: 4.0000 mg | Freq: Four times a day (QID) | INTRAMUSCULAR | Status: DC | PRN

## 2023-06-11 MED ORDER — METHADONE HCL 10 MG/ML PO CONC
90.0000 mg | Freq: Every morning | ORAL | Status: DC
  Administered 2023-06-11 – 2023-06-12 (×2): 90 mg via ORAL
  Filled 2023-06-11 (×4): qty 10

## 2023-06-11 MED ORDER — ONDANSETRON HCL 4 MG PO TABS: 4.0000 mg | ORAL_TABLET | Freq: Four times a day (QID) | ORAL | Status: DC | PRN

## 2023-06-11 NOTE — H&P (Signed)
 History and Physical    Patient: Erik [name]:[id number] DOB: 07/23/1995 DOA: 06/10/2023 DOS: the patient was seen and examined on 06/11/2023 PCP: Patient, No Pcp Per  Patient coming from: Home  Chief Complaint:  Chief Complaint  Patient presents with   Testicle Pain   HPI: Erik [name] is a 28 y.o. 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  Patient was admitted to the hospital with blood cultures obtained 2 out of 2 came back with MSSA.  Patient however left the hospital AGAINST MEDICAL ADVICE before treatment was completed.  He returned today to the ER after he was called and informed that he has bacteremia.  He denied any active fever or chills at the moment.  Denied nausea or vomiting.  Patient has been admitted with MSSA bacteremia and scrotal cellulitis.  review of Systems: As mentioned in the history of present illness. All other systems reviewed and are negative. 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   Past Surgical History:  Procedure Laterality Date   CARDIAC CATHETERIZATION N/A 02/22/2015   Procedure: Left Heart Cath and Coronary  Angiography;  Surgeon: Lennette Bihari, MD;  Location: [facility] INVASIVE CV LAB;  Service: Cardiovascular;  Laterality: N/A;   TEE WITHOUT CARDIOVERSION N/A 02/24/2015   Procedure: TRANSESOPHAGEAL ECHOCARDIOGRAM (TEE);  Surgeon: Chilton Si, MD;  Location: [facility] ENDOSCOPY;  Service: Cardiovascular;  Laterality: N/A;   TEE WITHOUT CARDIOVERSION N/A 12/09/2015   Procedure: TRANSESOPHAGEAL ECHOCARDIOGRAM (TEE);  Surgeon: Vesta Mixer, MD;  Location: [facility] ENDOSCOPY;  Service: Cardiovascular;  Laterality: N/A;   Social History:  reports that he has quit smoking. His smoking use included cigarettes. He has never used smokeless tobacco. He reports current alcohol use. He reports current drug use. Drugs: Cocaine, Marijuana, and IV.  Allergies  Allergen Reactions   Sulfa Antibiotics Swelling and Rash    Childhood allergic reaction    Family History  Adopted: Yes    Prior to Admission medications   Medication Sig Start Date End Date Taking? Authorizing Provider  methadone (DOLOPHINE) 10 MG/ML solution Take 90 mg by mouth in the morning.    [provider]    Physical Exam: Vitals:   06/10/23 1829 06/10/23 1837 06/10/23 2115  BP: 110/62  116/67  Pulse: 70  80  Resp: 16  16  Temp: 97.7 F (36.5 C)  97.8 F (36.6 C)  TempSrc: Oral    SpO2: 100%  100%  Weight:  63.5 kg   Height:  5\' 6"  (1.676 m)    Constitutional: Acutely ill looking, no distress NAD, calm, comfortable Eyes: PERRL, lids and conjunctivae  normal ENMT: Mucous membranes are moist. Posterior pharynx clear of any exudate or lesions.Normal dentition.  Neck: normal, supple, no masses, no thyromegaly Respiratory: clear to auscultation bilaterally, no wheezing, no crackles. Normal respiratory effort. No accessory muscle use.  Cardiovascular: Regular rate and rhythm, no murmurs / rubs / gallops. No extremity edema. 2+ pedal pulses. No carotid bruits.  Abdomen: no tenderness, no masses palpated. No hepatosplenomegaly. Bowel  sounds positive.  GU: Swollen scrotum, warm and tender Musculoskeletal: Good range of motion, no joint swelling or tenderness, Skin: no rashes, lesions, ulcers. No induration Neurologic: CN 2-12 grossly intact. Sensation intact, DTR normal. Strength 5/5 in all 4.  Psychiatric: Normal judgment and insight. Alert and oriented x 3. Normal mood  Data Reviewed:  Temperature 99.3, white count 6.3 hemoglobin 11.7, calcium 8.8 , Blood cultures from 4/2 showed MSSA BCID,   Assessment and Plan:  #1 MSSA bacteremia: Probably source endocarditis versus history of rectal cellulitis.  Patient will be on IV Ancef.  Patient will need echocardiogram and TEE.  Await final sensitivities.  Patient not a candidate for home IV antibiotics due to IV drug abuse.  ID consultation in the morning.  #2 scrotal cellulitis: Will continue with IV Ancef.  Await for cultures.  Continue per urology.  #3 IV drug abuse: Patient will need counseling.  #4 normocytic anemia: Continue to monitor H&H    Advance Care Planning:   Code Status: Full Code   Consults: Urology Dr. Alvester Morin  Family Communication: No family at bedside  Severity of Illness: The appropriate patient status for this patient is INPATIENT. Inpatient status is judged to be reasonable and necessary in order to provide the required intensity of service to ensure the patient's safety. The patient's presenting symptoms, physical exam findings, and initial radiographic and laboratory data in the context of their chronic comorbidities is felt to place them at high risk for further clinical deterioration. Furthermore, it is not anticipated that the patient will be medically stable for discharge from the hospital within 2 midnights of admission.   * I certify that at the point of admission it is my clinical judgment that the patient will require inpatient hospital care spanning beyond 2 midnights from the point of admission due to high intensity of service, high risk  for further deterioration and high frequency of surveillance required.*  AuthorLonia Blood, MD 06/11/2023 12:03 AM  For on call review [email or web address].

## 2023-06-11 NOTE — Progress Notes (Signed)
 PROGRESS NOTE    Erik [name]  ZHY:[id number] DOB: 07-Mar-1996 DOA: 06/10/2023 PCP: Patient, No Pcp Per  Chief Complaint  Patient presents with   Testicle Pain    Brief Narrative:   Erik [name] is Erik [name] 28 y.o. 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  Patient was admitted to the hospital with blood cultures obtained 2 out of 2 came back with MSSA.  Patient however left the hospital AGAINST MEDICAL ADVICE before treatment was completed.  He returned today to the ER after he was called and informed that he has bacteremia.  He denied any active fever or chills at the moment.  Denied nausea or vomiting.  Patient has been admitted with MSSA bacteremia and scrotal cellulitis.   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  Severe TR.  Will defer question of TEE to ID. ID consult, appreciate recs Appreciate urology assistance   Polysubstance Abuse On daily methadone - continue, doing well with this Uses fentanyl and cocaine occasionally Encourage abstinence     DVT prophylaxis: SCD Code Status: full Family Communication: none Disposition:   Status is: Inpatient Remains inpatient appropriate because: need for ID c/s, urology - workup for bacteremia   Consultants:  ID urology  Procedures:  IMPRESSIONS     1.  Left ventricular ejection fraction, by estimation, is 55 to 60%. Left  ventricular ejection fraction by 3D volume is 56 %. The left ventricle has  normal function. The left ventricle has no regional wall motion  abnormalities. Left ventricular diastolic   parameters were normal.   2. Right ventricular systolic function is normal. The right ventricular  size is moderately enlarged. There is normal pulmonary artery systolic  pressure. The estimated right ventricular systolic pressure is 28.2 mmHg.   3. Right atrial size was severely dilated.   4. The mitral valve is normal in structure. No evidence of mitral valve  regurgitation. No evidence of mitral stenosis.   5. No large vegetation is noted, although there appears to be severe  prolapse of the posterior leaflet with anteriorly directed tricuspid  regurgitation. The tricuspid valve is abnormal. Tricuspid valve  regurgitation is severe.   6. The aortic valve is grossly normal. Aortic valve regurgitation is not  visualized. No aortic stenosis is present.   7. The inferior vena cava is normal in size with greater than 50%  respiratory variability, suggesting right atrial pressure of 3 mmHg.   Conclusion(s)/Recommendation(s): No evidence of valvular vegetations on  this transthoracic echocardiogram. Consider Erik [name] transesophageal  echocardiogram to exclude infective endocarditis if clinically indicated.   Antimicrobials:  Anti-infectives (From admission, onward)    Start     Dose/Rate Route Frequency Ordered Stop   06/10/23 2100  ceFAZolin (ANCEF) IVPB 2g/100 mL premix        2 g 200 mL/hr over 30 Minutes Intravenous Every 8 hours 06/10/23 2054  Subjective: No new complaints  Objective: Vitals:   06/10/23 2115 06/10/23 2355 06/11/23 0606 06/11/23 1328  BP: 116/67 104/63 104/64 121/75  Pulse: 80 65 (!) 57 61  Resp: 16 20 19 17   Temp: 97.8 F (36.6 C) 98.6 F (37 C) 97.6 F (36.4 C) 97.8 F (36.6 C)  TempSrc:  Oral Oral    SpO2: 100% 99% 99% 98%  Weight:      Height:        Intake/Output Summary (Last 24 hours) at 06/11/2023 1407 Last data filed at 06/11/2023 0600 Gross per 24 hour  Intake 120 ml  Output 150 ml  Net -30 ml   Filed Weights   06/10/23 1837  Weight: 63.5 kg    Examination:  General exam: Appears calm and comfortable  Respiratory system: unlabored Cardiovascular system: RRR Gastrointestinal system: Abdomen is nondistended, soft and nontender GU with scrotal edema (he reports this has gotten better) Central nervous system: Alert and oriented. No focal neurological deficits. Extremities: no LEE    Data Reviewed: I have personally reviewed following labs and imaging studies  CBC: Recent Labs  Lab 06/08/23 2245 06/09/23 0440 06/10/23 2300 06/11/23 0844  WBC 10.3 9.0 6.3 3.8*  NEUTROABS 7.8* 7.1 3.4  --   HGB 13.7 11.0* 11.7* 11.6*  HCT 41.7 32.1* 36.4* 35.8*  MCV 87.1 85.6 88.6 88.2  PLT 212 165 236 218    Basic Metabolic Panel: Recent Labs  Lab 06/08/23 2245 06/09/23 0440 06/10/23 2300 06/11/23 0844  NA 131* 132* 141 137  K 3.6 3.4* 3.5 3.8  CL 96* 101 105 105  CO2 24 24 26 27   GLUCOSE 101* 118* 90 102*  BUN 13 10 11 10   CREATININE 0.89 0.66 0.67 0.68  CALCIUM 8.7* 8.1* 8.8* 8.8*    GFR: Estimated Creatinine Clearance: 122.4 mL/min (by C-G formula based on SCr of 0.68 mg/dL).  Liver Function Tests: Recent Labs  Lab 06/08/23 2245 06/09/23 0440 06/11/23 0844  AST 42* 32 26  ALT 34 27 26  ALKPHOS 60 49 48  BILITOT 1.1 0.8 0.2  PROT 8.4* 6.3* 6.7  ALBUMIN 3.7 2.7* 2.9*    CBG: No results for input(s): "GLUCAP" in the last 168 hours.   Recent Results (from the past 240 hours)  Blood Culture (routine x 2)     Status: Abnormal (Preliminary result)   Collection Time: 06/08/23 10:45 PM   Specimen: BLOOD LEFT FOREARM  Result Value Ref Range Status   Specimen Description   Final    BLOOD LEFT FOREARM Performed at [facility], [address], [place], [place] [address]    Special Requests   Final    BOTTLES DRAWN AEROBIC AND ANAEROBIC Blood Culture results may not be optimal due to an inadequate volume of blood received in culture bottles Performed at [facility], [address], [place], [place] [address]    Culture  Setup Time   Final    GRAM POSITIVE COCCI IN CLUSTERS IN BOTH AEROBIC AND ANAEROBIC BOTTLES CRITICAL RESULT CALLED TO, READ BACK BY AND VERIFIED WITH: [facility] RN Clelia Croft [phone] AT 0815 BY EC    Culture (Reg Bircher)  Final    STAPHYLOCOCCUS AUREUS SUSCEPTIBILITIES TO FOLLOW Performed at [facility], [address] [place], [place] [address]    Report Status PENDING  Incomplete  Blood Culture ID Panel (Reflexed)     Status: Abnormal   Collection Time: 06/08/23 10:45 PM  Result Value Ref Range Status  Enterococcus faecalis NOT DETECTED NOT DETECTED Final   Enterococcus Faecium NOT DETECTED NOT DETECTED Final   Listeria monocytogenes NOT DETECTED NOT DETECTED Final   Staphylococcus species DETECTED (Makaila Windle) NOT DETECTED Final    Comment: CRITICAL RESULT CALLED TO, READ BACK BY AND VERIFIED WITH: [facility] RN Clelia Croft [phone] AT 0815 BY EC    Staphylococcus aureus (BCID) DETECTED (Arnold Depinto) NOT DETECTED Final    Comment: CRITICAL RESULT CALLED TO, READ BACK BY AND VERIFIED WITH: [facility] RN Clelia Croft [phone] AT 0815 BY EC    Staphylococcus epidermidis NOT DETECTED NOT DETECTED Final   Staphylococcus lugdunensis NOT DETECTED NOT DETECTED Final   Streptococcus species NOT DETECTED NOT DETECTED Final   Streptococcus agalactiae NOT DETECTED NOT DETECTED Final   Streptococcus pneumoniae NOT DETECTED NOT DETECTED Final   Streptococcus pyogenes NOT DETECTED NOT DETECTED Final   Anabel Lykins.calcoaceticus-baumannii NOT DETECTED NOT DETECTED Final   Bacteroides fragilis NOT DETECTED NOT DETECTED Final   Enterobacterales NOT DETECTED NOT DETECTED Final   Enterobacter cloacae complex NOT DETECTED NOT DETECTED Final   Escherichia coli  NOT DETECTED NOT DETECTED Final   Klebsiella aerogenes NOT DETECTED NOT DETECTED Final   Klebsiella oxytoca NOT DETECTED NOT DETECTED Final   Klebsiella pneumoniae NOT DETECTED NOT DETECTED Final   Proteus species NOT DETECTED NOT DETECTED Final   Salmonella species NOT DETECTED NOT DETECTED Final   Serratia marcescens NOT DETECTED NOT DETECTED Final   Haemophilus influenzae NOT DETECTED NOT DETECTED Final   Neisseria meningitidis NOT DETECTED NOT DETECTED Final   Pseudomonas aeruginosa NOT DETECTED NOT DETECTED Final   Stenotrophomonas maltophilia NOT DETECTED NOT DETECTED Final   Candida albicans NOT DETECTED NOT DETECTED Final   Candida auris NOT DETECTED NOT DETECTED Final   Candida glabrata NOT DETECTED NOT DETECTED Final   Candida krusei NOT DETECTED NOT DETECTED Final   Candida parapsilosis NOT DETECTED NOT DETECTED Final   Candida tropicalis NOT DETECTED NOT DETECTED Final   Cryptococcus neoformans/gattii NOT DETECTED NOT DETECTED Final   Meth resistant mecA/C and MREJ NOT DETECTED NOT DETECTED Final    Comment: Performed at [facility], [address] [place], [place] [address]  Resp panel by RT-PCR (RSV, Flu Tamra Koos&B, Covid) Urine, Random     Status: None   Collection Time: 06/08/23 10:56 PM   Specimen: Urine, Random; Nasal Swab  Result Value Ref Range Status   SARS Coronavirus 2 by RT PCR NEGATIVE NEGATIVE Final    Comment: (NOTE) SARS-CoV-2 target nucleic acids are NOT DETECTED.  The SARS-CoV-2 RNA is generally detectable in upper respiratory specimens during the acute phase of infection. The lowest concentration of SARS-CoV-2 viral copies this assay can detect is 138 copies/mL. Leen Tworek negative result does not preclude SARS-Cov-2 infection and should not be used as the sole basis for treatment or other patient management decisions. Brent Taillon negative result may occur with  improper specimen collection/handling, submission of specimen other than nasopharyngeal swab, presence of  viral mutation(s) within the areas targeted by this assay, and inadequate number of viral copies(<138 copies/mL). Shahzain Kiester negative result must be combined with clinical observations, patient history, and epidemiological information. The expected result is Negative.  Fact Sheet for Patients:  BloggerCourse.com  Fact Sheet for Healthcare Providers:  SeriousBroker.it  This test is no t yet approved or cleared by the [place] FDA and  has been authorized for detection and/or diagnosis of SARS-CoV-2 by FDA under an Emergency Use Authorization (EUA). This EUA will remain  in effect (meaning this test  can be used) for the duration of the COVID-19 declaration under Section 564(b)(1) of the Act, 21 U.S.C.section 360bbb-3(b)(1), unless the authorization is terminated  or revoked sooner.       Influenza Gardenia Witter by PCR NEGATIVE NEGATIVE Final   Influenza B by PCR NEGATIVE NEGATIVE Final    Comment: (NOTE) The Xpert Xpress SARS-CoV-2/FLU/RSV plus assay is intended as an aid in the diagnosis of influenza from Nasopharyngeal swab specimens and should not be used as Santana Gosdin sole basis for treatment. Nasal washings and aspirates are unacceptable for Xpert Xpress SARS-CoV-2/FLU/RSV testing.  Fact Sheet for Patients: BloggerCourse.com  Fact Sheet for Healthcare Providers: SeriousBroker.it  This test is not yet approved or cleared by the [place] FDA and has been authorized for detection and/or diagnosis of SARS-CoV-2 by FDA under an Emergency Use Authorization (EUA). This EUA will remain in effect (meaning this test can be used) for the duration of the COVID-19 declaration under Section 564(b)(1) of the Act, 21 U.S.C. section 360bbb-3(b)(1), unless the authorization is terminated or revoked.     Resp Syncytial Virus by PCR NEGATIVE NEGATIVE Final    Comment: (NOTE) Fact Sheet for  Patients: BloggerCourse.com  Fact Sheet for Healthcare Providers: SeriousBroker.it  This test is not yet approved or cleared by the [place] FDA and has been authorized for detection and/or diagnosis of SARS-CoV-2 by FDA under an Emergency Use Authorization (EUA). This EUA will remain in effect (meaning this test can be used) for the duration of the COVID-19 declaration under Section 564(b)(1) of the Act, 21 U.S.C. section 360bbb-3(b)(1), unless the authorization is terminated or revoked.  Performed at [facility], [address] [place], [place] [address]   Blood Culture (routine x 2)     Status: None (Preliminary result)   Collection Time: 06/08/23 11:15 PM   Specimen: BLOOD  Result Value Ref Range Status   Specimen Description   Final    BLOOD LEFT ANTECUBITAL Performed at [facility], [address] [place], [place] [address]    Special Requests   Final    BOTTLES DRAWN AEROBIC AND ANAEROBIC Blood Culture adequate volume Performed at [facility], [address] [place], [place] [address]    Culture   Final    NO GROWTH 2 DAYS Performed at [facility], [address], [place], [place] [address]    Report Status PENDING  Incomplete  MRSA Next Gen by PCR, Nasal     Status: None   Collection Time: 06/09/23 10:41 AM   Specimen: Nasal Mucosa; Nasal Swab  Result Value Ref Range Status   MRSA by PCR Next Gen NOT DETECTED NOT DETECTED Final    Comment: (NOTE) The GeneXpert MRSA Assay (FDA approved for NASAL specimens only), is one component of Starnisha Batrez comprehensive MRSA colonization surveillance program. It is not intended to diagnose MRSA infection nor to guide or monitor treatment for MRSA infections. Test performance is not FDA approved in patients less than 49 years old. Performed at [facility], [address], [place], [place]  [address]   Blood culture (routine x 2)     Status: None (Preliminary result)   Collection Time: 06/10/23 11:00 PM   Specimen: BLOOD LEFT ARM  Result Value Ref Range Status   Specimen Description   Final    BLOOD LEFT ARM Performed at [facility], [address] [place], [place] [address]    Special Requests   Final  BOTTLES DRAWN AEROBIC AND ANAEROBIC Blood Culture results may not be optimal due to an inadequate volume of blood received in culture bottles Performed at [facility], [address], [place], [place] [address]    Culture PENDING  Incomplete   Report Status PENDING  Incomplete  Culture, blood (Routine X 2) w Reflex to ID Panel     Status: None (Preliminary result)   Collection Time: 06/11/23  8:44 AM   Specimen: BLOOD RIGHT ARM  Result Value Ref Range Status   Specimen Description   Final    BLOOD RIGHT ARM Performed at [facility], [address], [place], [place] [address]    Special Requests   Final    BOTTLES DRAWN AEROBIC AND ANAEROBIC Blood Culture results may not be optimal due to an inadequate volume of blood received in culture bottles Performed at [facility], [address], [place], [place] [address]    Culture PENDING  Incomplete   Report Status PENDING  Incomplete         Radiology Studies: ECHOCARDIOGRAM COMPLETE Result Date: 06/11/2023    ECHOCARDIOGRAM REPORT   Patient Name:   WILLIM TURNAGE Date of Exam: 06/11/2023 Medical Rec #:  [id number]       Height:       66.0 in Accession #:    [id number]      Weight:       140.0 lb Date of Birth:  [birth year]       BSA:          1.719 m Patient Age:    27 years        BP:           104/64 mmHg Patient Gender: M               HR:           62 bpm. Exam Location:  Inpatient Procedure: 2D Echo, 3D Echo, Cardiac Doppler and Color Doppler (Both Spectral            and Color Flow Doppler were utilized during procedure). Indications:    Endocarditis  History:         Patient has prior history of Echocardiogram examinations, most                 recent 12/14/2015. Previous Myocardial Infarction. Polysubstance                 abuse. Tricuspid endocarditis.  Sonographer:    Sheralyn Boatman RDCS Referring Phys: [id number] MOHAMMAD L GARBA IMPRESSIONS  1. Left ventricular ejection fraction, by estimation, is 55 to 60%. Left ventricular ejection fraction by 3D volume is 56 %. The left ventricle has normal function. The left ventricle has no regional wall motion abnormalities. Left ventricular diastolic  parameters were normal.  2. Right ventricular systolic function is normal. The right ventricular size is moderately enlarged. There is normal pulmonary artery systolic pressure. The estimated right ventricular systolic pressure is 28.2 mmHg.  3. Right atrial size was severely dilated.  4. The mitral valve is normal in structure. No evidence of mitral valve regurgitation. No evidence of mitral stenosis.  5. No large vegetation is noted, although there appears to be severe prolapse of the posterior leaflet with anteriorly directed tricuspid regurgitation. The tricuspid valve is abnormal. Tricuspid valve regurgitation is severe.  6. The aortic valve is grossly normal. Aortic valve regurgitation is not visualized. No aortic stenosis is present.  7.  The inferior vena cava is normal in size with greater than 50% respiratory variability, suggesting right atrial pressure of 3 mmHg. Conclusion(s)/Recommendation(s): No evidence of valvular vegetations on this transthoracic echocardiogram. Consider Parley Pidcock transesophageal echocardiogram to exclude infective endocarditis if clinically indicated. FINDINGS  Left Ventricle: Left ventricular ejection fraction, by estimation, is 55 to 60%. Left ventricular ejection fraction by 3D volume is 56 %. The left ventricle has normal function. The left ventricle has no regional wall motion abnormalities. The left ventricular internal cavity size was normal in size. There is no  left ventricular hypertrophy. Left ventricular diastolic parameters were normal. Right Ventricle: The right ventricular size is moderately enlarged. No increase in right ventricular wall thickness. Right ventricular systolic function is normal. There is normal pulmonary artery systolic pressure. The tricuspid regurgitant velocity is 2.51 m/s, and with an assumed right atrial pressure of 3 mmHg, the estimated right ventricular systolic pressure is 28.2 mmHg. Left Atrium: Left atrial size was normal in size. Right Atrium: Right atrial size was severely dilated. Pericardium: There is no evidence of pericardial effusion. Mitral Valve: The mitral valve is normal in structure. No evidence of mitral valve regurgitation. No evidence of mitral valve stenosis. Tricuspid Valve: No large vegetation is noted, although there appears to be severe prolapse of the posterior leaflet with anteriorly directed tricuspid regurgitation. The tricuspid valve is abnormal. Tricuspid valve regurgitation is severe. No evidence of tricuspid stenosis. 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 The flow in the hepatic veins is reversed during ventricular systole. Aortic Valve: The aortic valve is grossly normal. Aortic valve regurgitation is not visualized. No aortic stenosis is present. Pulmonic Valve: The pulmonic valve was normal in structure. Pulmonic valve regurgitation is trivial. No evidence of pulmonic stenosis. Aorta: The aortic root and ascending aorta are structurally normal, with no evidence of dilitation. Venous: The inferior vena cava is normal in size with greater than 50% respiratory variability, suggesting right atrial pressure of 3 mmHg. IAS/Shunts: No atrial level shunt detected by color flow Doppler. Additional Comments: 3D was performed not requiring image post processing on an independent workstation and was normal.  LEFT VENTRICLE PLAX 2D LVIDd:         4.50 cm         Diastology LVIDs:          3.10 cm         LV e' medial:    15.70 cm/s LV PW:         1.00 cm         LV E/e' medial:  5.7 LV IVS:        0.80 cm         LV e' lateral:   21.30 cm/s LVOT diam:     2.40 cm         LV E/e' lateral: 4.2 LV SV:         94 LV SV Index:   55 LVOT Area:     4.52 cm        3D Volume EF                                LV 3D EF:    Left  ventricul LV Volumes (MOD)                            ar LV vol d, MOD    97.3 ml                    ejection A2C:                                        fraction LV vol d, MOD    99.7 ml                    by 3D A4C:                                        volume is LV vol s, MOD    41.4 ml                    56 %. A2C: LV vol s, MOD    63.2 ml A4C:                           3D Volume EF: LV SV MOD A2C:   55.9 ml       3D EF:        56 % LV SV MOD A4C:   99.7 ml       LV EDV:       138 ml LV SV MOD BP:    49.1 ml       LV ESV:       61 ml                                LV SV:        77 ml RIGHT VENTRICLE             IVC RV S prime:     17.70 cm/s  IVC diam: 2.30 cm TAPSE (M-mode): 3.1 cm LEFT ATRIUM             Index        RIGHT ATRIUM           Index LA diam:        2.70 cm 1.57 cm/m   RA Area:     28.50 cm LA Vol (A2C):   34.0 ml 19.78 ml/m  RA Volume:   112.00 ml 65.17 ml/m LA Vol (A4C):   25.4 ml 14.78 ml/m LA Biplane Vol: 30.0 ml 17.46 ml/m  AORTIC VALVE LVOT Vmax:   101.00 cm/s LVOT Vmean:  63.900 cm/s LVOT VTI:    0.208 m  AORTA Ao Root diam: 3.20 cm Ao Asc diam:  2.60 cm MITRAL VALVE               TRICUSPID VALVE MV Area (PHT): 2.99 cm    TR Peak grad:   25.2 mmHg MV Decel Time: 254 msec    TR Vmax:        251.00 cm/s MV E velocity: 89.70 cm/s MV Arayah Krouse velocity: 61.30 cm/s  SHUNTS MV E/Trase Bunda ratio:  1.46        Systemic VTI:  0.21 m  Systemic Diam: 2.40 cm Zoila Shutter MD Electronically signed by Zoila Shutter MD Signature Date/Time: 06/11/2023/11:10:17 AM    Final         Scheduled Meds:  methadone   90 mg Oral q AM   Continuous Infusions:  sodium chloride 50 mL/hr at 06/11/23 0038    ceFAZolin (ANCEF) IV 2 g (06/11/23 1351)     LOS: 1 day    Time spent: over 30 min    Lacretia Nicks, MD [facility]   To contact the attending provider between 7A-7P or the covering provider during after hours 7P-7A, please log into the web site [email or web address] and access using universal [place] password for that web site. If you do not have the password, please call the hospital operator.  06/11/2023, 2:07 PM

## 2023-06-11 NOTE — Plan of Care (Signed)

## 2023-06-11 NOTE — H&P (Incomplete)
  History and Physical    Patient: Erik [name] UJW:[id number] DOB: October 05, 1995 DOA: 06/10/2023 DOS: the patient was seen and examined on 06/11/2023 PCP: Patient, No Pcp Per  Patient coming from: {Point_of_Origin:[address]}  Chief Complaint:  Chief Complaint  Patient presents with  . Testicle Pain   HPI: Erik [name] is a 28 y.o. male with medical history significant of ***  Review of Systems: {ROS_Text:26778} Past Medical History:  Diagnosis Date  . 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 Anxiety   . Drug abuse and dependence (HCC)    admit with OD 11/2014: rhabdo and AKI, ARF -   . Hepatitis B    chronic viral/hx per notes 12/05/2015  . History of endocarditis    LESCHBER, VALERIE A [[id number]]  . IV drug user    heroine  . NSTEMI (non-ST elevated myocardial infarction) (HCC)    Hattie Perch 12/05/2015  . Pneumonia 12/04/2015  . Septic pulmonary embolism (HCC) 12/07/2015  . Sinusitis   . 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   Past Surgical History:  Procedure Laterality Date  . CARDIAC CATHETERIZATION N/A 02/22/2015   Procedure: Left Heart Cath and Coronary Angiography;  Surgeon: Lennette Bihari, MD;  Location: [facility] INVASIVE CV LAB;  Service: Cardiovascular;  Laterality: N/A;  . TEE WITHOUT CARDIOVERSION N/A 02/24/2015   Procedure: TRANSESOPHAGEAL ECHOCARDIOGRAM (TEE);  Surgeon: Chilton Si, MD;  Location: [facility] ENDOSCOPY;  Service: Cardiovascular;  Laterality: N/A;  . TEE WITHOUT CARDIOVERSION N/A 12/09/2015   Procedure: TRANSESOPHAGEAL ECHOCARDIOGRAM (TEE);  Surgeon: Vesta Mixer, MD;  Location: [facility] ENDOSCOPY;  Service: Cardiovascular;  Laterality: N/A;   Social History:  reports that he has quit smoking. His smoking use included cigarettes. He has never used smokeless tobacco. He reports current alcohol use. He reports current drug use. Drugs: Cocaine, Marijuana, and IV.  Allergies  Allergen Reactions  . Sulfa  Antibiotics Swelling and Rash    Childhood allergic reaction    Family History  Adopted: Yes    Prior to Admission medications   Medication Sig Start Date End Date Taking? Authorizing Provider  methadone (DOLOPHINE) 10 MG/ML solution Take 90 mg by mouth in the morning.    [provider]    Physical Exam: Vitals:   06/10/23 1829 06/10/23 1837 06/10/23 2115  BP: 110/62  116/67  Pulse: 70  80  Resp: 16  16  Temp: 97.7 F (36.5 C)  97.8 F (36.6 C)  TempSrc: Oral    SpO2: 100%  100%  Weight:  63.5 kg   Height:  5\' 6"  (1.676 m)    *** Data Reviewed: {Tip this will not be part of the note when signed- Document your independent interpretation of telemetry tracing, EKG, lab, Radiology test or any other diagnostic tests. Add any new diagnostic test ordered today. (Optional):26781} {Results:26384}  Assessment and Plan: No notes have been filed under this hospital service. Service: Hospitalist     Advance Care Planning:   Code Status: Full Code ***  Consults: ***  Family Communication: ***  Severity of Illness: {Observation/Inpatient:21159}  AuthorLonia Blood, MD 06/11/2023 12:03 AM  For on call review [email or web address].

## 2023-06-11 NOTE — TOC Initial Note (Signed)
 Transition of Care [facility]) - Initial/Assessment Note    Patient Details  Name: Erik [name] MRN: [id number] Date of Birth: [birth year]  [facility]) CM/SW Contact:    Adrian Prows, RN Phone Number: 06/11/2023, 1:57 PM  Clinical Narrative:                 No PCP listed; spoke w/ pt in room; pt says he lives at home; he plans to return at d/c; pt identified POC Kenry Daubert (mother) [phone]; pt says he has transportation; pt verified insurance; pt says he does not have a PCP; he denies SDOH risks; pt says he does not have DME, HH services, or home oxygen; pt declined resources for PCP; he was advised to contact his insurance for list of in-network providers; TOC will follow.  Expected Discharge Plan: Home/Self Care Barriers to Discharge: Continued Medical Work up   Patient Goals and CMS Choice Patient states their goals for this hospitalization and ongoing recovery are:: home          Expected Discharge Plan and Services   Discharge Planning Services: CM Consult   Living arrangements for the past 2 months: Single Family Home                                      Prior Living Arrangements/Services Living arrangements for the past 2 months: Single Family Home Lives with:: Self Patient language and need for interpreter reviewed:: Yes Do you feel safe going back to the place where you live?: Yes      Need for Family Participation in Patient Care: Yes (Comment) Care giver support system in place?: Yes (comment) Current home services:  (n/a) Criminal Activity/Legal Involvement Pertinent to Current Situation/Hospitalization: No - Comment as needed  Activities of Daily Living   ADL Screening (condition at time of admission) Independently performs ADLs?: Yes (appropriate for developmental age) Is the patient deaf or have difficulty hearing?: No Does the patient have difficulty seeing, even when wearing glasses/contacts?: No Does the patient  have difficulty concentrating, remembering, or making decisions?: No  Permission Sought/Granted Permission sought to share information with : Case Manager Permission granted to share information with : Yes, Verbal Permission Granted  Share Information with NAME: Case Manager     Permission granted to share info w Relationship: Jayvion Stefanski (mpther) [phone]     Emotional Assessment Appearance:: Appears stated age Attitude/Demeanor/Rapport: Gracious Affect (typically observed): Accepting Orientation: : Oriented to Self, Oriented to Place, Oriented to  Time, Oriented to Situation Alcohol / Substance Use: Not Applicable Psych Involvement: No (comment)  Admission diagnosis:  Cellulitis of scrotum [N49.2] Patient Active Problem List   Diagnosis Date Noted   Cellulitis of scrotum 06/10/2023   Cellulitis 06/09/2023   Sepsis (HCC) 06/09/2023   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   Septic pulmonary embolism (HCC) 12/07/2015   MSSA bacteremia 12/06/2015   Thrombocytopenia (HCC) 12/05/2015   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   Cocaine abuse (HCC)    Acute renal failure due to rhabdomyolysis (HCC) 02/22/2015   NSTEMI (non-ST elevated myocardial infarction) (HCC)    Traumatic rhabdomyolysis (HCC) 11/10/2014   Viral hepatitis B chronic (HCC) 11/10/2014   IVDU (intravenous drug user)    Polysubstance dependence (HCC) 11/16/2013   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/2013   Drug overdose 04/16/2013   PCP:  Patient, No Pcp Per Pharmacy:   [id number] [place], [place] - [address] AT [facility] & [facility] [address] [place] [address] Phone: [phone] Fax: [phone]     Social Drivers of Health (SDOH) Social History: SDOH Screenings   Food Insecurity: No Food Insecurity (06/11/2023)  Housing: Low Risk  (06/11/2023)  Recent Concern: Housing - High Risk (06/10/2023)   Transportation Needs: No Transportation Needs (06/11/2023)  Utilities: Not At Risk (06/11/2023)  Tobacco Use: Medium Risk (06/10/2023)   SDOH Interventions: Food Insecurity Interventions: Intervention Not Indicated, Inpatient TOC Housing Interventions: Intervention Not Indicated, Inpatient TOC Transportation Interventions: Intervention Not Indicated, Inpatient TOC Utilities Interventions: Intervention Not Indicated, Inpatient TOC   Readmission Risk Interventions    06/11/2023    1:51 PM  Readmission Risk Prevention Plan  Transportation Screening Complete  PCP or Specialist Appt within 5-7 Days Not Complete  Not Complete comments pt declined resources for PCP; he was advised to contact his insurance for list of in-network providers  Home Care Screening Complete  Medication Review (RN CM) Complete

## 2023-06-11 NOTE — Progress Notes (Signed)
  Echocardiogram 2D Echocardiogram has been performed.  Janalyn Harder 06/11/2023, 10:45 AM

## 2023-06-11 NOTE — Consult Note (Addendum)
 [facility]  Total days of antibiotics 1 cefazolin       Reason for Consult: MSSA bacteremia and scrotal cellulitis   Referring Physician: powell  Principal Problem:   Cellulitis of scrotum Active Problems:   Drug overdose   IVDU (intravenous drug user)   Cocaine abuse (HCC)   MSSA bacteremia    HPI: Erik [name] is a 28 y.o. 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 He reports that he noticed increasing itching in scrotal area. He wears mix of synthetic and cotton underwear. In the ED initial work up revealed 1 of 2 sets of blood cx + MSSA bacteremia. Hiv/Gc/Chlam/rpr negative. No recent sex  He left,AMA, and then returned on 4/5 for admission. He is now on cefazolin. ID asked to weigh in  Hx of hep B S Ag +. Hx of IVDU, no longer using iv.  He feels slightly improved since now initiating iv abtx.  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    Allergies:  Allergies  Allergen Reactions   Sulfa Antibiotics Swelling and Rash    Childhood allergic reaction      MEDICATIONS:  methadone  90 mg Oral q AM    Social History   Tobacco Use   Smoking status: Former    Current packs/day: 1.00     Types: Cigarettes   Smokeless tobacco: Never   Tobacco comments:    12/05/2015 "haven't had a cigarette in 2 wks"  Vaping Use   Vaping status: Every Day  Substance Use Topics   Alcohol use: Yes    Comment: 12/05/2015 "don't drink now"   Drug use: Yes    Types: Cocaine, Marijuana, IV    Comment: marijuana, heroin    Family History  Adopted: Yes    Review of Systems  Constitutional: Negative for fever, chills, diaphoresis, activity change, appetite change, fatigue and unexpected weight change.  HENT: Negative for congestion, sore throat, rhinorrhea, sneezing, trouble swallowing and sinus pressure.  Eyes: Negative for photophobia and visual disturbance.  Respiratory: Negative for cough, chest tightness, shortness of breath, wheezing and stridor.  Cardiovascular: Negative for chest pain, palpitations and leg swelling.  Gastrointestinal: Negative for nausea, vomiting, abdominal pain, diarrhea, constipation, blood in stool, abdominal distention and anal bleeding.  Genitourinary: Negative for dysuria, hematuria, flank pain and difficulty urinating.  Musculoskeletal: Negative for myalgias, back pain, joint swelling, arthralgias and gait problem.  Skin: Negative for color change, pallor, rash and wound.  Neurological: Negative for dizziness, tremors, weakness and light-headedness.  Hematological: Negative for adenopathy. Does not bruise/bleed easily.  Psychiatric/Behavioral: Negative for behavioral problems, confusion,  sleep disturbance, dysphoric mood, decreased concentration and agitation.    OBJECTIVE: Temp:  [97.6 F (36.4 C)-98.6 F (37 C)] 97.8 F (36.6 C) (04/05 1328) Pulse Rate:  [57-80] 61 (04/05 1328) Resp:  [16-20] 17 (04/05 1328) BP: (104-121)/(62-75) 121/75 (04/05 1328) SpO2:  [98 %-100 %] 98 % (04/05 1328) Weight:  [63.5 kg] 63.5 kg (04/04 1837) Physical Exam  Constitutional: He is oriented to person, place, and time. He appears well-developed and well-nourished. No  distress.  HENT:  Mouth/Throat: Oropharynx is clear and moist. No oropharyngeal exudate.  Cardiovascular: Normal rate, regular rhythm and normal heart sounds. Harsh 4/6 m BH at apex Pulmonary/Chest: Effort normal and breath sounds normal. No respiratory distress. He has no wheezes.  Abdominal: Soft. Bowel sounds are normal. He exhibits no distension. There is no tenderness.  Lymphadenopathy: +inguinal lymphadenopathy He has no cervical adenopathy. Gu = swollen slightly erythematous Neurological: He is alert and oriented to person, place, and time.  Skin: Skin is warm and dry. No rash noted. No erythema.  Psychiatric: He has a normal mood and affect. His behavior is normal.    LABS: Results for orders placed or performed during the hospital encounter of 06/10/23 (from the past 48 hours)  Blood culture (routine x 2)     Status: None (Preliminary result)   Collection Time: 06/10/23 11:00 PM   Specimen: BLOOD LEFT ARM  Result Value Ref Range   Specimen Description      BLOOD LEFT ARM Performed at [facility], [address] [place], [place] [address]    Special Requests      BOTTLES DRAWN AEROBIC AND ANAEROBIC Blood Culture results may not be optimal due to an inadequate volume of blood received in culture bottles Performed at [facility], [address] [place], [place] [address]    Culture PENDING    Report Status PENDING   CBC with Differential     Status: Abnormal   Collection Time: 06/10/23 11:00 PM  Result Value Ref Range   WBC 6.3 4.0 - 10.5 K/uL   RBC 4.11 (L) 4.22 - 5.81 MIL/uL   Hemoglobin 11.7 (L) 13.0 - 17.0 g/dL   HCT 09.8 (L) 11.9 - 14.7 %   MCV 88.6 80.0 - 100.0 fL   MCH 28.5 26.0 - 34.0 pg   MCHC 32.1 30.0 - 36.0 g/dL   RDW 82.9 56.2 - 13.0 %   Platelets 236 150 - 400 K/uL   nRBC 0.0 0.0 - 0.2 %   Neutrophils Relative % 53 %   Neutro Abs 3.4 1.7 - 7.7 K/uL   Lymphocytes Relative 34 %   Lymphs Abs 2.1 0.7 - 4.0 K/uL   Monocytes  Relative 9 %   Monocytes Absolute 0.6 0.1 - 1.0 K/uL   Eosinophils Relative 4 %   Eosinophils Absolute 0.2 0.0 - 0.5 K/uL   Basophils Relative 0 %   Basophils Absolute 0.0 0.0 - 0.1 K/uL   Immature Granulocytes 0 %   Abs Immature Granulocytes 0.01 0.00 - 0.07 K/uL    Comment: Performed at [facility], [address] [place], [place] [address]  Basic metabolic panel     Status: Abnormal   Collection Time: 06/10/23 11:00 PM  Result Value Ref Range   Sodium 141 135 - 145 mmol/L    Comment: DELTA CHECK NOTED   Potassium 3.5 3.5 - 5.1 mmol/L   Chloride 105 98 - 111 mmol/L   CO2 26 22 - 32  mmol/L   Glucose, Bld 90 70 - 99 mg/dL    Comment: Glucose reference range applies only to samples taken after fasting for at least 8 hours.   BUN 11 6 - 20 mg/dL   Creatinine, Ser 1.61 0.61 - 1.24 mg/dL   Calcium 8.8 (L) 8.9 - 10.3 mg/dL   GFR, Estimated >09 >60 mL/min    Comment: (NOTE) Calculated using the CKD-EPI Creatinine Equation (2021)    Anion gap 10 5 - 15    Comment: Performed at [facility], [address] [place], [place] [address]  Culture, blood (Routine X 2) w Reflex to ID Panel     Status: None (Preliminary result)   Collection Time: 06/11/23  8:44 AM   Specimen: BLOOD RIGHT ARM  Result Value Ref Range   Specimen Description      BLOOD RIGHT ARM Performed at [facility], [address], [place], [place] [address]    Special Requests      BOTTLES DRAWN AEROBIC AND ANAEROBIC Blood Culture results may not be optimal due to an inadequate volume of blood received in culture bottles Performed at [facility], [address], [place], [place] [address]    Culture PENDING    Report Status PENDING   CBC     Status: Abnormal   Collection Time: 06/11/23  8:44 AM  Result Value Ref Range   WBC 3.8 (L) 4.0 - 10.5 K/uL   RBC 4.06 (L) 4.22 - 5.81 MIL/uL   Hemoglobin 11.6 (L) 13.0 - 17.0 g/dL   HCT 56.2 (L) 13.0 - 86.5 %    MCV 88.2 80.0 - 100.0 fL   MCH 28.6 26.0 - 34.0 pg   MCHC 32.4 30.0 - 36.0 g/dL   RDW 78.4 69.6 - 29.5 %   Platelets 218 150 - 400 K/uL   nRBC 0.0 0.0 - 0.2 %    Comment: Performed at [facility], [address], [place], [place] [address]  Comprehensive metabolic panel with GFR     Status: Abnormal   Collection Time: 06/11/23  8:44 AM  Result Value Ref Range   Sodium 137 135 - 145 mmol/L   Potassium 3.8 3.5 - 5.1 mmol/L   Chloride 105 98 - 111 mmol/L   CO2 27 22 - 32 mmol/L   Glucose, Bld 102 (H) 70 - 99 mg/dL    Comment: Glucose reference range applies only to samples taken after fasting for at least 8 hours.   BUN 10 6 - 20 mg/dL   Creatinine, Ser 2.44 0.61 - 1.24 mg/dL   Calcium 8.8 (L) 8.9 - 10.3 mg/dL   Total Protein 6.7 6.5 - 8.1 g/dL   Albumin 2.9 (L) 3.5 - 5.0 g/dL   AST 26 15 - 41 U/L   ALT 26 0 - 44 U/L   Alkaline Phosphatase 48 38 - 126 U/L   Total Bilirubin 0.2 0.0 - 1.2 mg/dL   GFR, Estimated >01 >02 mL/min    Comment: (NOTE) Calculated using the CKD-EPI Creatinine Equation (2021)    Anion gap 5 5 - 15    Comment: Performed at [facility], [address], [place], [place] [address]    MICRO: 4/2 blood cx MSSA in 1 of 2 sets 4/4 blood cx NGTD IMAGING: ECHOCARDIOGRAM COMPLETE Result Date: 06/11/2023    ECHOCARDIOGRAM REPORT   Patient Name:   Erik [name] Date of Exam: 06/11/2023 Medical Rec #:  [id number]  Height:       66.0 in Accession #:    [id number]      Weight:       140.0 lb Date of Birth:  [birth year]       BSA:          1.719 m Patient Age:    27 years        BP:           104/64 mmHg Patient Gender: M               HR:           62 bpm. Exam Location:  Inpatient Procedure: 2D Echo, 3D Echo, Cardiac Doppler and Color Doppler (Both Spectral            and Color Flow Doppler were utilized during procedure). Indications:    Endocarditis  History:        Patient has prior history of Echocardiogram examinations, most                  recent 12/14/2015. Previous Myocardial Infarction. Polysubstance                 abuse. Tricuspid endocarditis.  Sonographer:    Sheralyn Boatman RDCS Referring Phys: [id number] MOHAMMAD L GARBA IMPRESSIONS  1. Left ventricular ejection fraction, by estimation, is 55 to 60%. Left ventricular ejection fraction by 3D volume is 56 %. The left ventricle has normal function. The left ventricle has no regional wall motion abnormalities. Left ventricular diastolic  parameters were normal.  2. Right ventricular systolic function is normal. The right ventricular size is moderately enlarged. There is normal pulmonary artery systolic pressure. The estimated right ventricular systolic pressure is 28.2 mmHg.  3. Right atrial size was severely dilated.  4. The mitral valve is normal in structure. No evidence of mitral valve regurgitation. No evidence of mitral stenosis.  5. No large vegetation is noted, although there appears to be severe prolapse of the posterior leaflet with anteriorly directed tricuspid regurgitation. The tricuspid valve is abnormal. Tricuspid valve regurgitation is severe.  6. The aortic valve is grossly normal. Aortic valve regurgitation is not visualized. No aortic stenosis is present.  7. The inferior vena cava is normal in size with greater than 50% respiratory variability, suggesting right atrial pressure of 3 mmHg. Conclusion(s)/Recommendation(s): No evidence of valvular vegetations on this transthoracic echocardiogram. Consider a transesophageal echocardiogram to exclude infective endocarditis if clinically indicated. FINDINGS  Left Ventricle: Left ventricular ejection fraction, by estimation, is 55 to 60%. Left ventricular ejection fraction by 3D volume is 56 %. The left ventricle has normal function. The left ventricle has no regional wall motion abnormalities. The left ventricular internal cavity size was normal in size. There is no left ventricular hypertrophy. Left ventricular diastolic  parameters were normal. Right Ventricle: The right ventricular size is moderately enlarged. No increase in right ventricular wall thickness. Right ventricular systolic function is normal. There is normal pulmonary artery systolic pressure. The tricuspid regurgitant velocity is 2.51 m/s, and with an assumed right atrial pressure of 3 mmHg, the estimated right ventricular systolic pressure is 28.2 mmHg. Left Atrium: Left atrial size was normal in size. Right Atrium: Right atrial size was severely dilated. Pericardium: There is no evidence of pericardial effusion. Mitral Valve: The mitral valve is normal in structure. No evidence of mitral valve regurgitation. No evidence of mitral valve stenosis. Tricuspid Valve: No large vegetation is noted, although there appears to be severe prolapse of  the posterior leaflet with anteriorly directed tricuspid regurgitation. The tricuspid valve is abnormal. Tricuspid valve regurgitation is severe. No evidence of tricuspid stenosis. 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 The flow in the hepatic veins is reversed during ventricular systole. Aortic Valve: The aortic valve is grossly normal. Aortic valve regurgitation is not visualized. No aortic stenosis is present. Pulmonic Valve: The pulmonic valve was normal in structure. Pulmonic valve regurgitation is trivial. No evidence of pulmonic stenosis. Aorta: The aortic root and ascending aorta are structurally normal, with no evidence of dilitation. Venous: The inferior vena cava is normal in size with greater than 50% respiratory variability, suggesting right atrial pressure of 3 mmHg. IAS/Shunts: No atrial level shunt detected by color flow Doppler. Additional Comments: 3D was performed not requiring image post processing on an independent workstation and was normal.  LEFT VENTRICLE PLAX 2D LVIDd:         4.50 cm         Diastology LVIDs:         3.10 cm         LV e' medial:    15.70 cm/s LV PW:          1.00 cm         LV E/e' medial:  5.7 LV IVS:        0.80 cm         LV e' lateral:   21.30 cm/s LVOT diam:     2.40 cm         LV E/e' lateral: 4.2 LV SV:         94 LV SV Index:   55 LVOT Area:     4.52 cm        3D Volume EF                                LV 3D EF:    Left                                             ventricul LV Volumes (MOD)                            ar LV vol d, MOD    97.3 ml                    ejection A2C:                                        fraction LV vol d, MOD    99.7 ml                    by 3D A4C:                                        volume is LV vol s, MOD    41.4 ml                    56 %. A2C: LV vol s, MOD  63.2 ml A4C:                           3D Volume EF: LV SV MOD A2C:   55.9 ml       3D EF:        56 % LV SV MOD A4C:   99.7 ml       LV EDV:       138 ml LV SV MOD BP:    49.1 ml       LV ESV:       61 ml                                LV SV:        77 ml RIGHT VENTRICLE             IVC RV S prime:     17.70 cm/s  IVC diam: 2.30 cm TAPSE (M-mode): 3.1 cm LEFT ATRIUM             Index        RIGHT ATRIUM           Index LA diam:        2.70 cm 1.57 cm/m   RA Area:     28.50 cm LA Vol (A2C):   34.0 ml 19.78 ml/m  RA Volume:   112.00 ml 65.17 ml/m LA Vol (A4C):   25.4 ml 14.78 ml/m LA Biplane Vol: 30.0 ml 17.46 ml/m  AORTIC VALVE LVOT Vmax:   101.00 cm/s LVOT Vmean:  63.900 cm/s LVOT VTI:    0.208 m  AORTA Ao Root diam: 3.20 cm Ao Asc diam:  2.60 cm MITRAL VALVE               TRICUSPID VALVE MV Area (PHT): 2.99 cm    TR Peak grad:   25.2 mmHg MV Decel Time: 254 msec    TR Vmax:        251.00 cm/s MV E velocity: 89.70 cm/s MV A velocity: 61.30 cm/s  SHUNTS MV E/A ratio:  1.46        Systemic VTI:  0.21 m                            Systemic Diam: 2.40 cm Zoila Shutter MD Electronically signed by Zoila Shutter MD Signature Date/Time: 06/11/2023/11:10:17 AM    Final     HISTORICAL MICRO/IMAGING  Assessment/Plan:  28yo M with MSSA bacteremia likely secondary to  scrotal cellulitis. He does have remote hx of TV endocarditis, now with severe TR. No longer iv drug user.  - continue on cefazolin 2gm iv q 8hr - follow up on repeat blood cx - would get TEE to see if any vegetation c/w new endocarditis  Leukopenia = possibly due to bacteremia/infection. Will continue to monitor  Hx of hepatitis b s ag = will check hepatitis b VL/  will check hep C ab\  Opiate dependence = continue on methadone daily dosing.  Standard precautions for now  evaluation of this patient requires complex antimicrobial therapy evaluation and counseling and isolation needs for disease transmission risk assessment and mitigation.    Duke Salvia Drue Second MD MPH [facility] [phone]

## 2023-06-11 NOTE — Consult Note (Signed)
 H&P Physician requesting consult: Lacretia Nicks  Chief Complaint: Scrotal cellulitis  History of Present Illness: 28 year old male with history of IV drug abuse patient was seen a couple days ago with scrotal cellulitis but left AGAINST MEDICAL ADVICE but returned today after being called and informed that he had bacteremia.  He is admitted for MSSA bacteremia and scrotal cellulitis.  Urology consulted because of scrotal cellulitis.  Blood cultures growing Staphylococcus aureus with subset debilities to follow.  On 4/3, his urinalysis was negative for infection.  Most recent white blood cell count was 3.8.  CT scan of the abdomen and pelvis on 06/08/2023 showed significant scrotal edema with no abscess or subcutaneous gas.  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   Past Surgical History:  Procedure Laterality Date   CARDIAC CATHETERIZATION N/A 02/22/2015   Procedure: Left Heart Cath and Coronary Angiography;  Surgeon: Lennette Bihari, MD;  Location: [facility]VASIVE [facility];  Service: Cardiovascular;  Laterality: N/A;   TEE WITHOUT CARDIOVERSION N/A 02/24/2015   Procedure: TRANSESOPHAGEAL ECHOCARDIOGRAM (TEE);  Surgeon: Chilton Si, MD;  Location: [facility] ENDOSCOPY;  Service: Cardiovascular;  Laterality: N/A;   TEE WITHOUT CARDIOVERSION N/A 12/09/2015   Procedure: TRANSESOPHAGEAL ECHOCARDIOGRAM (TEE);  Surgeon: Vesta Mixer, MD;  Location: [facility] ENDOSCOPY;  Service:  Cardiovascular;  Laterality: N/A;    Home Medications:  Medications Prior to Admission  Medication Sig Dispense Refill Last Dose/Taking   methadone (DOLOPHINE) 10 MG/ML solution Take 90 mg by mouth in the morning.      Allergies:  Allergies  Allergen Reactions   Sulfa Antibiotics Swelling and Rash    Childhood allergic reaction    Family History  Adopted: Yes   Social History:  reports that he has quit smoking. His smoking use included cigarettes. He has never used smokeless tobacco. He reports current alcohol use. He reports current drug use. Drugs: Cocaine, Marijuana, and IV.  ROS: A complete review of systems was performed.  All systems are negative except for pertinent findings as noted. ROS   Physical Exam:  Vital signs in last 24 hours: Temp:  [97.6 F (36.4 C)-98.6 F (37 C)] 97.8 F (36.6 C) (04/05 1328) Pulse Rate:  [57-80] 61 (04/05 1328) Resp:  [16-20] 17 (04/05 1328) BP: (104-121)/(62-75) 121/75 (04/05 1328) SpO2:  [98 %-100 %] 98 % (04/05 1328) Weight:  [63.5 kg] 63.5 kg (04/04 1837) General:  Alert and oriented, No acute distress HEENT: Normocephalic, atraumatic Neck: No JVD or lymphadenopathy Cardiovascular: Regular rate and rhythm Lungs: Regular rate and effort Abdomen: Soft, nontender, nondistended, no abdominal masses Back: No CVA tenderness Genitourinary: moderate scrotal edema and erythema consistent with cellulitis.  No crepitus or fluctuance. Mild induration on the left Extremities: No edema Neurologic: Grossly intact  Laboratory Data:  Results for orders placed or performed during the hospital encounter of 06/10/23 (from the past 24 hours)  Blood  culture (routine x 2)     Status: None (Preliminary result)   Collection Time: 06/10/23 11:00 PM   Specimen: BLOOD LEFT ARM  Result Value Ref Range   Specimen Description      BLOOD LEFT ARM Performed at [facility], [address] [place], [place] [address]    Special Requests       BOTTLES DRAWN AEROBIC AND ANAEROBIC Blood Culture results may not be optimal due to an inadequate volume of blood received in culture bottles Performed at [facility], [address] [place], [place] [address]    Culture PENDING    Report Status PENDING   CBC with Differential     Status: Abnormal   Collection Time: 06/10/23 11:00 PM  Result Value Ref Range   WBC 6.3 4.0 - 10.5 K/uL   RBC 4.11 (L) 4.22 - 5.81 MIL/uL   Hemoglobin 11.7 (L) 13.0 - 17.0 g/dL   HCT 09.8 (L) 11.9 - 14.7 %   MCV 88.6 80.0 - 100.0 fL   MCH 28.5 26.0 - 34.0 pg   MCHC 32.1 30.0 - 36.0 g/dL   RDW 82.9 56.2 - 13.0 %   Platelets 236 150 - 400 K/uL   nRBC 0.0 0.0 - 0.2 %   Neutrophils Relative % 53 %   Neutro Abs 3.4 1.7 - 7.7 K/uL   Lymphocytes Relative 34 %   Lymphs Abs 2.1 0.7 - 4.0 K/uL   Monocytes Relative 9 %   Monocytes Absolute 0.6 0.1 - 1.0 K/uL   Eosinophils Relative 4 %   Eosinophils Absolute 0.2 0.0 - 0.5 K/uL   Basophils Relative 0 %   Basophils Absolute 0.0 0.0 - 0.1 K/uL   Immature Granulocytes 0 %   Abs Immature Granulocytes 0.01 0.00 - 0.07 K/uL  Basic metabolic panel     Status: Abnormal   Collection Time: 06/10/23 11:00 PM  Result Value Ref Range   Sodium 141 135 - 145 mmol/L   Potassium 3.5 3.5 - 5.1 mmol/L   Chloride 105 98 - 111 mmol/L   CO2 26 22 - 32 mmol/L   Glucose, Bld 90 70 - 99 mg/dL   BUN 11 6 - 20 mg/dL   Creatinine, Ser 8.65 0.61 - 1.24 mg/dL   Calcium 8.8 (L) 8.9 - 10.3 mg/dL   GFR, Estimated >78 >46 mL/min   Anion gap 10 5 - 15  Culture, blood (Routine X 2) w Reflex to ID Panel     Status: None (Preliminary result)   Collection Time: 06/11/23  8:44 AM   Specimen: BLOOD RIGHT ARM  Result Value Ref Range   Specimen Description      BLOOD RIGHT ARM Performed at [facility], [address], [place], [place] [address]    Special Requests      BOTTLES DRAWN AEROBIC AND ANAEROBIC Blood Culture results may not be optimal due to an inadequate  volume of blood received in culture bottles Performed at [facility], [address] [place], [place] [address]    Culture PENDING    Report Status PENDING   CBC     Status: Abnormal   Collection Time: 06/11/23  8:44 AM  Result Value Ref Range   WBC 3.8 (L) 4.0 - 10.5 K/uL   RBC 4.06 (L) 4.22 - 5.81 MIL/uL   Hemoglobin 11.6 (L) 13.0 - 17.0 g/dL   HCT 24.4 (L) 01.0 - 27.2 %   MCV 88.2 80.0 -  100.0 fL   MCH 28.6 26.0 - 34.0 pg   MCHC 32.4 30.0 - 36.0 g/dL   RDW 16.1 09.6 - 04.5 %   Platelets 218 150 - 400 K/uL   nRBC 0.0 0.0 - 0.2 %  Comprehensive metabolic panel with GFR     Status: Abnormal   Collection Time: 06/11/23  8:44 AM  Result Value Ref Range   Sodium 137 135 - 145 mmol/L   Potassium 3.8 3.5 - 5.1 mmol/L   Chloride 105 98 - 111 mmol/L   CO2 27 22 - 32 mmol/L   Glucose, Bld 102 (H) 70 - 99 mg/dL   BUN 10 6 - 20 mg/dL   Creatinine, Ser 4.09 0.61 - 1.24 mg/dL   Calcium 8.8 (L) 8.9 - 10.3 mg/dL   Total Protein 6.7 6.5 - 8.1 g/dL   Albumin 2.9 (L) 3.5 - 5.0 g/dL   AST 26 15 - 41 U/L   ALT 26 0 - 44 U/L   Alkaline Phosphatase 48 38 - 126 U/L   Total Bilirubin 0.2 0.0 - 1.2 mg/dL   GFR, Estimated >81 >19 mL/min   Anion gap 5 5 - 15   Recent Results (from the past 240 hours)  Blood Culture (routine x 2)     Status: Abnormal (Preliminary result)   Collection Time: 06/08/23 10:45 PM   Specimen: BLOOD LEFT FOREARM  Result Value Ref Range Status   Specimen Description   Final    BLOOD LEFT FOREARM Performed at [facility], [address], [place], [place] [address]    Special Requests   Final    BOTTLES DRAWN AEROBIC AND ANAEROBIC Blood Culture results may not be optimal due to an inadequate volume of blood received in culture bottles Performed at [facility], [address] [place], [place] [address]    Culture  Setup Time   Final    GRAM POSITIVE COCCI IN CLUSTERS IN BOTH AEROBIC AND ANAEROBIC BOTTLES CRITICAL RESULT  CALLED TO, READ BACK BY AND VERIFIED WITH: [facility] RN Clelia Croft [phone] AT 0815 BY EC    Culture (A)  Final    STAPHYLOCOCCUS AUREUS SUSCEPTIBILITIES TO FOLLOW Performed at [facility], [address], [place], [place] [address]    Report Status PENDING  Incomplete  Blood Culture ID Panel (Reflexed)     Status: Abnormal   Collection Time: 06/08/23 10:45 PM  Result Value Ref Range Status   Enterococcus faecalis NOT DETECTED NOT DETECTED Final   Enterococcus Faecium NOT DETECTED NOT DETECTED Final   Listeria monocytogenes NOT DETECTED NOT DETECTED Final   Staphylococcus species DETECTED (A) NOT DETECTED Final    Comment: CRITICAL RESULT CALLED TO, READ BACK BY AND VERIFIED WITH: [facility] RN Clelia Croft [phone] AT 0815 BY EC    Staphylococcus aureus (BCID) DETECTED (A) NOT DETECTED Final    Comment: CRITICAL RESULT CALLED TO, READ BACK BY AND VERIFIED WITH: [facility] RN Clelia Croft [phone] AT 0815 BY EC    Staphylococcus epidermidis NOT DETECTED NOT DETECTED Final   Staphylococcus lugdunensis NOT DETECTED NOT DETECTED Final   Streptococcus species NOT DETECTED NOT DETECTED Final   Streptococcus agalactiae NOT DETECTED NOT DETECTED Final   Streptococcus pneumoniae NOT DETECTED NOT DETECTED Final   Streptococcus pyogenes NOT DETECTED NOT DETECTED Final   A.calcoaceticus-baumannii NOT DETECTED NOT DETECTED Final   Bacteroides fragilis NOT DETECTED NOT DETECTED Final   Enterobacterales NOT DETECTED NOT DETECTED Final   Enterobacter cloacae complex NOT DETECTED NOT  DETECTED Final   Escherichia coli NOT DETECTED NOT DETECTED Final   Klebsiella aerogenes NOT DETECTED NOT DETECTED Final   Klebsiella oxytoca NOT DETECTED NOT DETECTED Final   Klebsiella pneumoniae NOT DETECTED NOT DETECTED Final   Proteus species NOT DETECTED NOT DETECTED Final   Salmonella species NOT DETECTED NOT DETECTED Final   Serratia marcescens NOT DETECTED NOT DETECTED Final   Haemophilus influenzae NOT DETECTED NOT DETECTED Final    Neisseria meningitidis NOT DETECTED NOT DETECTED Final   Pseudomonas aeruginosa NOT DETECTED NOT DETECTED Final   Stenotrophomonas maltophilia NOT DETECTED NOT DETECTED Final   Candida albicans NOT DETECTED NOT DETECTED Final   Candida auris NOT DETECTED NOT DETECTED Final   Candida glabrata NOT DETECTED NOT DETECTED Final   Candida krusei NOT DETECTED NOT DETECTED Final   Candida parapsilosis NOT DETECTED NOT DETECTED Final   Candida tropicalis NOT DETECTED NOT DETECTED Final   Cryptococcus neoformans/gattii NOT DETECTED NOT DETECTED Final   Meth resistant mecA/C and MREJ NOT DETECTED NOT DETECTED Final    Comment: Performed at [facility], [address] [place], [place] [address]  Resp panel by RT-PCR (RSV, Flu A&B, Covid) Urine, Random     Status: None   Collection Time: 06/08/23 10:56 PM   Specimen: Urine, Random; Nasal Swab  Result Value Ref Range Status   SARS Coronavirus 2 by RT PCR NEGATIVE NEGATIVE Final    Comment: (NOTE) SARS-CoV-2 target nucleic acids are NOT DETECTED.  The SARS-CoV-2 RNA is generally detectable in upper respiratory specimens during the acute phase of infection. The lowest concentration of SARS-CoV-2 viral copies this assay can detect is 138 copies/mL. A negative result does not preclude SARS-Cov-2 infection and should not be used as the sole basis for treatment or other patient management decisions. A negative result may occur with  improper specimen collection/handling, submission of specimen other than nasopharyngeal swab, presence of viral mutation(s) within the areas targeted by this assay, and inadequate number of viral copies(<138 copies/mL). A negative result must be combined with clinical observations, patient history, and epidemiological information. The expected result is Negative.  Fact Sheet for Patients:  BloggerCourse.com  Fact Sheet for Healthcare Providers:   SeriousBroker.it  This test is no t yet approved or cleared by the [place] FDA and  has been authorized for detection and/or diagnosis of SARS-CoV-2 by FDA under an Emergency Use Authorization (EUA). This EUA will remain  in effect (meaning this test can be used) for the duration of the COVID-19 declaration under Section 564(b)(1) of the Act, 21 U.S.C.section 360bbb-3(b)(1), unless the authorization is terminated  or revoked sooner.       Influenza A by PCR NEGATIVE NEGATIVE Final   Influenza B by PCR NEGATIVE NEGATIVE Final    Comment: (NOTE) The Xpert Xpress SARS-CoV-2/FLU/RSV plus assay is intended as an aid in the diagnosis of influenza from Nasopharyngeal swab specimens and should not be used as a sole basis for treatment. Nasal washings and aspirates are unacceptable for Xpert Xpress SARS-CoV-2/FLU/RSV testing.  Fact Sheet for Patients: BloggerCourse.com  Fact Sheet for Healthcare Providers: SeriousBroker.it  This test is not yet approved or cleared by the [place] FDA and has been authorized for detection and/or diagnosis of SARS-CoV-2 by FDA under an Emergency Use Authorization (EUA). This EUA will remain in effect (meaning this test can be used) for the duration of the COVID-19 declaration under Section 564(b)(1) of the Act, 21 U.S.C. section 360bbb-3(b)(1), unless the authorization is terminated or  revoked.     Resp Syncytial Virus by PCR NEGATIVE NEGATIVE Final    Comment: (NOTE) Fact Sheet for Patients: BloggerCourse.com  Fact Sheet for Healthcare Providers: SeriousBroker.it  This test is not yet approved or cleared by the [place] FDA and has been authorized for detection and/or diagnosis of SARS-CoV-2 by FDA under an Emergency Use Authorization (EUA). This EUA will remain in effect (meaning this test can be used) for  the duration of the COVID-19 declaration under Section 564(b)(1) of the Act, 21 U.S.C. section 360bbb-3(b)(1), unless the authorization is terminated or revoked.  Performed at [facility], [address], [place], [place] [address]   Blood Culture (routine x 2)     Status: None (Preliminary result)   Collection Time: 06/08/23 11:15 PM   Specimen: BLOOD  Result Value Ref Range Status   Specimen Description   Final    BLOOD LEFT ANTECUBITAL Performed at [facility], [address] [place], [place] [address]    Special Requests   Final    BOTTLES DRAWN AEROBIC AND ANAEROBIC Blood Culture adequate volume Performed at [facility], [address] [place], [place] [address]    Culture   Final    NO GROWTH 2 DAYS Performed at [facility], [address] [place], [place] [address]    Report Status PENDING  Incomplete  MRSA Next Gen by PCR, Nasal     Status: None   Collection Time: 06/09/23 10:41 AM   Specimen: Nasal Mucosa; Nasal Swab  Result Value Ref Range Status   MRSA by PCR Next Gen NOT DETECTED NOT DETECTED Final    Comment: (NOTE) The GeneXpert MRSA Assay (FDA approved for NASAL specimens only), is one component of a comprehensive MRSA colonization surveillance program. It is not intended to diagnose MRSA infection nor to guide or monitor treatment for MRSA infections. Test performance is not FDA approved in patients less than 34 years old. Performed at [facility], [address] [place], [place] [address]   Blood culture (routine x 2)     Status: None (Preliminary result)   Collection Time: 06/10/23 11:00 PM   Specimen: BLOOD LEFT ARM  Result Value Ref Range Status   Specimen Description   Final    BLOOD LEFT ARM Performed at [facility], [address], [place], [place] [address]    Special Requests   Final    BOTTLES DRAWN AEROBIC AND ANAEROBIC Blood Culture results may  not be optimal due to an inadequate volume of blood received in culture bottles Performed at [facility], [address], [place], [place] [address]    Culture PENDING  Incomplete   Report Status PENDING  Incomplete  Culture, blood (Routine X 2) w Reflex to ID Panel     Status: None (Preliminary result)   Collection Time: 06/11/23  8:44 AM   Specimen: BLOOD RIGHT ARM  Result Value Ref Range Status   Specimen Description   Final    BLOOD RIGHT ARM Performed at [facility], [address] [place], [place] [address]    Special Requests   Final    BOTTLES DRAWN AEROBIC AND ANAEROBIC Blood Culture results may not be optimal due to an inadequate volume of blood received in culture bottles Performed at [facility], [address], [place], [place] [address]    Culture PENDING  Incomplete   Report Status PENDING  Incomplete  Creatinine: Recent Labs    06/08/23 2245 06/09/23 0440 06/10/23 2300 06/11/23 0844  CREATININE 0.89 0.66 0.67 0.68   CT scan personally reviewed and is detailed in history of present illness  Impression/Assessment:  Scrotal cellulitis  Plan:  Continue IV antibiotics and follow-up on sensitivities.  If physical examination changes, consider repeat US or CT to evaluate for interval change.  No sign of necrotizing infection or abscess at this time.  Ray Church, III 06/11/2023, 2:35 PM

## 2023-06-12 DIAGNOSIS — N492 Inflammatory disorders of scrotum: Secondary | ICD-10-CM | POA: Diagnosis not present

## 2023-06-12 LAB — CULTURE, BLOOD (ROUTINE X 2)

## 2023-06-12 LAB — COMPREHENSIVE METABOLIC PANEL WITH GFR
ALT: 25 U/L (ref 0–44)
AST: 29 U/L (ref 15–41)
Albumin: 3.1 g/dL — ABNORMAL LOW (ref 3.5–5.0)
Alkaline Phosphatase: 50 U/L (ref 38–126)
Anion gap: 8 (ref 5–15)
BUN: 9 mg/dL (ref 6–20)
CO2: 26 mmol/L (ref 22–32)
Calcium: 8.8 mg/dL — ABNORMAL LOW (ref 8.9–10.3)
Chloride: 103 mmol/L (ref 98–111)
Creatinine, Ser: 0.69 mg/dL (ref 0.61–1.24)
GFR, Estimated: >60 mL/min (ref >60)
Glucose, Bld: 134 mg/dL — ABNORMAL HIGH (ref 70–99)
Potassium: 3.3 mmol/L — ABNORMAL LOW (ref 3.5–5.1)
Sodium: 137 mmol/L (ref 135–145)
Total Bilirubin: 0.3 mg/dL (ref 0.0–1.2)
Total Protein: 7.6 g/dL (ref 6.5–8.1)

## 2023-06-12 LAB — CBC WITH DIFFERENTIAL/PLATELET
Abs Immature Granulocytes: 0.01 10*3/uL (ref 0.00–0.07)
Basophils Absolute: 0 10*3/uL (ref 0.0–0.1)
Basophils Relative: 1 %
Eosinophils Absolute: 0.2 10*3/uL (ref 0.0–0.5)
Eosinophils Relative: 5 %
HCT: 37.1 % — ABNORMAL LOW (ref 39.0–52.0)
Hemoglobin: 11.7 g/dL — ABNORMAL LOW (ref 13.0–17.0)
Immature Granulocytes: 0 %
Lymphocytes Relative: 39 %
Lymphs Abs: 1.7 10*3/uL (ref 0.7–4.0)
MCH: 28.1 pg (ref 26.0–34.0)
MCHC: 31.5 g/dL (ref 30.0–36.0)
MCV: 89.2 fL (ref 80.0–100.0)
Monocytes Absolute: 0.4 10*3/uL (ref 0.1–1.0)
Monocytes Relative: 8 %
Neutro Abs: 2.1 10*3/uL (ref 1.7–7.7)
Neutrophils Relative %: 47 %
Platelets: 256 10*3/uL (ref 150–400)
RBC: 4.16 MIL/uL — ABNORMAL LOW (ref 4.22–5.81)
RDW: 11.9 % (ref 11.5–15.5)
WBC: 4.4 10*3/uL (ref 4.0–10.5)
nRBC: 0 % (ref 0.0–0.2)

## 2023-06-12 LAB — MAGNESIUM: Magnesium: 1.9 mg/dL (ref 1.7–2.4)

## 2023-06-12 LAB — HEPATITIS C ANTIBODY: HCV Ab: NONREACTIVE

## 2023-06-12 LAB — PHOSPHORUS: Phosphorus: 3.5 mg/dL (ref 2.5–4.6)

## 2023-06-12 LAB — C-REACTIVE PROTEIN: CRP: 2.8 mg/dL — ABNORMAL HIGH (ref <1.0)

## 2023-06-12 NOTE — Progress Notes (Signed)
 Urology Inpatient Progress Report  Cellulitis of scrotum [N49.2]        Intv/Subj: No acute events overnight. Patient is without complaint.  Scrotum feeling better  Principal Problem:   Cellulitis of scrotum Active Problems:   Drug overdose   IVDU (intravenous drug user)   Cocaine abuse (HCC)   MSSA bacteremia  Current Facility-Administered Medications  Medication Dose Route Frequency Provider Last Rate Last Admin   ceFAZolin (ANCEF) IVPB 2g/100 mL premix  2 g Intravenous Q8H Earlie Lou L, MD 200 mL/hr at 06/12/23 0512 2 g at 06/12/23 0512   methadone (DOLOPHINE) 10 MG/ML solution 90 mg  90 mg Oral q AM Rometta Emery, MD   90 mg at 06/12/23 0716   ondansetron (ZOFRAN) tablet 4 mg  4 mg Oral Q6H PRN Rometta Emery, MD       Or   ondansetron (ZOFRAN) injection 4 mg  4 mg Intravenous Q6H PRN Rometta Emery, MD         Objective: Vital: Vitals:   06/11/23 0606 06/11/23 1328 06/11/23 2112 06/12/23 0512  BP: 104/64 121/75 119/74 118/78  Pulse: (!) 57 61 (!) 56 (!) 54  Resp: 19 17 18 18   Temp: 97.6 F (36.4 C) 97.8 F (36.6 C) 98.6 F (37 C) 98.5 F (36.9 C)  TempSrc: Oral     SpO2: 99% 98% 100% 99%  Weight:      Height:       I/Os: I/O last 3 completed shifts: In: 2872.3 [P.O.:1180; I.V.:1192.3; IV Piggyback:500] Out: 750 [Urine:750]  Physical Exam:  General: Patient is in no apparent distress Lungs: Normal respiratory effort, chest expands symmetrically. Genitourinary: Scrotal cellulitis appears to be improving.  Less induration on the left.  No evidence of necrotizing infection.  No crepitus or fluctuance. Ext: lower extremities symmetric  Lab Results: Recent Labs    06/10/23 2300 06/11/23 0844 06/12/23 0836  WBC 6.3 3.8* 4.4  HGB 11.7* 11.6* 11.7*  HCT 36.4* 35.8* 37.1*   Recent Labs    06/10/23 2300 06/11/23 0844 06/12/23 0836  NA 141 137 137  K 3.5 3.8 3.3*  CL 105 105 103  CO2 26 27 26   GLUCOSE 90 102* 134*  BUN 11 10 9    CREATININE 0.67 0.68 0.69  CALCIUM 8.8* 8.8* 8.8*   No results for input(s): "LABPT", "INR" in the last 72 hours. No results for input(s): "LABURIN" in the last 72 hours. Results for orders placed or performed during the hospital encounter of 06/10/23  Blood culture (routine x 2)     Status: None (Preliminary result)   Collection Time: 06/10/23 11:00 PM   Specimen: BLOOD LEFT ARM  Result Value Ref Range Status   Specimen Description   Final    BLOOD LEFT ARM Performed at [facility], [address] [place], [place] [address]    Special Requests   Final    BOTTLES DRAWN AEROBIC AND ANAEROBIC Blood Culture results may not be optimal due to an inadequate volume of blood received in culture bottles Performed at [facility], [address] [place], [place] [address]    Culture   Final    NO GROWTH 1 DAY Performed at [facility], [address], [place], [place] [address]    Report Status PENDING  Incomplete  Culture, blood (Routine X 2) w Reflex to ID Panel     Status: None (Preliminary result)   Collection Time: 06/11/23  8:44 AM  Specimen: BLOOD RIGHT ARM  Result Value Ref Range Status   Specimen Description   Final    BLOOD RIGHT ARM Performed at [facility], [address], [place], [place] [address]    Special Requests   Final    BOTTLES DRAWN AEROBIC AND ANAEROBIC Blood Culture results may not be optimal due to an inadequate volume of blood received in culture bottles Performed at [facility], [address] [place], [place] [address]    Culture   Final    NO GROWTH < 24 HOURS Performed at [facility], [address] [place], [place] [address]    Report Status PENDING  Incomplete    Studies/Results: ECHOCARDIOGRAM COMPLETE Result Date: 06/11/2023    ECHOCARDIOGRAM REPORT   Patient Name:   Erik [name] Date of Exam: 06/11/2023 Medical Rec #:  [id number]       Height:       66.0 in Accession #:     [id number]      Weight:       140.0 lb Date of Birth:  1995/06/27       BSA:          1.719 m Patient Age:    27 years        BP:           104/64 mmHg Patient Gender: M               HR:           62 bpm. Exam Location:  Inpatient Procedure: 2D Echo, 3D Echo, Cardiac Doppler and Color Doppler (Both Spectral            and Color Flow Doppler were utilized during procedure). Indications:    Endocarditis  History:        Patient has prior history of Echocardiogram examinations, most                 recent 12/14/2015. Previous Myocardial Infarction. Polysubstance                 abuse. Tricuspid endocarditis.  Sonographer:    Sheralyn Boatman RDCS Referring Phys: [id number] MOHAMMAD L GARBA IMPRESSIONS  1. Left ventricular ejection fraction, by estimation, is 55 to 60%. Left ventricular ejection fraction by 3D volume is 56 %. The left ventricle has normal function. The left ventricle has no regional wall motion abnormalities. Left ventricular diastolic  parameters were normal.  2. Right ventricular systolic function is normal. The right ventricular size is moderately enlarged. There is normal pulmonary artery systolic pressure. The estimated right ventricular systolic pressure is 28.2 mmHg.  3. Right atrial size was severely dilated.  4. The mitral valve is normal in structure. No evidence of mitral valve regurgitation. No evidence of mitral stenosis.  5. No large vegetation is noted, although there appears to be severe prolapse of the posterior leaflet with anteriorly directed tricuspid regurgitation. The tricuspid valve is abnormal. Tricuspid valve regurgitation is severe.  6. The aortic valve is grossly normal. Aortic valve regurgitation is not visualized. No aortic stenosis is present.  7. The inferior vena cava is normal in size with greater than 50% respiratory variability, suggesting right atrial pressure of 3 mmHg. Conclusion(s)/Recommendation(s): No evidence of valvular vegetations on this transthoracic echocardiogram.  Consider a transesophageal echocardiogram to exclude infective endocarditis if clinically indicated. FINDINGS  Left Ventricle: Left ventricular ejection fraction, by estimation, is 55 to 60%. Left ventricular ejection fraction by 3D  volume is 56 %. The left ventricle has normal function. The left ventricle has no regional wall motion abnormalities. The left ventricular internal cavity size was normal in size. There is no left ventricular hypertrophy. Left ventricular diastolic parameters were normal. Right Ventricle: The right ventricular size is moderately enlarged. No increase in right ventricular wall thickness. Right ventricular systolic function is normal. There is normal pulmonary artery systolic pressure. The tricuspid regurgitant velocity is 2.51 m/s, and with an assumed right atrial pressure of 3 mmHg, the estimated right ventricular systolic pressure is 28.2 mmHg. Left Atrium: Left atrial size was normal in size. Right Atrium: Right atrial size was severely dilated. Pericardium: There is no evidence of pericardial effusion. Mitral Valve: The mitral valve is normal in structure. No evidence of mitral valve regurgitation. No evidence of mitral valve stenosis. Tricuspid Valve: No large vegetation is noted, although there appears to be severe prolapse of the posterior leaflet with anteriorly directed tricuspid regurgitation. The tricuspid valve is abnormal. Tricuspid valve regurgitation is severe. No evidence of tricuspid stenosis. 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 The flow in the hepatic veins is reversed during ventricular systole. Aortic Valve: The aortic valve is grossly normal. Aortic valve regurgitation is not visualized. No aortic stenosis is present. Pulmonic Valve: The pulmonic valve was normal in structure. Pulmonic valve regurgitation is trivial. No evidence of pulmonic stenosis. Aorta: The aortic root and ascending aorta are structurally normal, with no  evidence of dilitation. Venous: The inferior vena cava is normal in size with greater than 50% respiratory variability, suggesting right atrial pressure of 3 mmHg. IAS/Shunts: No atrial level shunt detected by color flow Doppler. Additional Comments: 3D was performed not requiring image post processing on an independent workstation and was normal.  LEFT VENTRICLE PLAX 2D LVIDd:         4.50 cm         Diastology LVIDs:         3.10 cm         LV e' medial:    15.70 cm/s LV PW:         1.00 cm         LV E/e' medial:  5.7 LV IVS:        0.80 cm         LV e' lateral:   21.30 cm/s LVOT diam:     2.40 cm         LV E/e' lateral: 4.2 LV SV:         94 LV SV Index:   55 LVOT Area:     4.52 cm        3D Volume EF                                LV 3D EF:    Left                                             ventricul LV Volumes (MOD)                            ar LV vol d, MOD    97.3 ml  ejection A2C:                                        fraction LV vol d, MOD    99.7 ml                    by 3D A4C:                                        volume is LV vol s, MOD    41.4 ml                    56 %. A2C: LV vol s, MOD    63.2 ml A4C:                           3D Volume EF: LV SV MOD A2C:   55.9 ml       3D EF:        56 % LV SV MOD A4C:   99.7 ml       LV EDV:       138 ml LV SV MOD BP:    49.1 ml       LV ESV:       61 ml                                LV SV:        77 ml RIGHT VENTRICLE             IVC RV S prime:     17.70 cm/s  IVC diam: 2.30 cm TAPSE (M-mode): 3.1 cm LEFT ATRIUM             Index        RIGHT ATRIUM           Index LA diam:        2.70 cm 1.57 cm/m   RA Area:     28.50 cm LA Vol (A2C):   34.0 ml 19.78 ml/m  RA Volume:   112.00 ml 65.17 ml/m LA Vol (A4C):   25.4 ml 14.78 ml/m LA Biplane Vol: 30.0 ml 17.46 ml/m  AORTIC VALVE LVOT Vmax:   101.00 cm/s LVOT Vmean:  63.900 cm/s LVOT VTI:    0.208 m  AORTA Ao Root diam: 3.20 cm Ao Asc diam:  2.60 cm MITRAL VALVE                TRICUSPID VALVE MV Area (PHT): 2.99 cm    TR Peak grad:   25.2 mmHg MV Decel Time: 254 msec    TR Vmax:        251.00 cm/s MV E velocity: 89.70 cm/s MV A velocity: 61.30 cm/s  SHUNTS MV E/A ratio:  1.46        Systemic VTI:  0.21 m                            Systemic Diam: 2.40 cm Zoila Shutter MD Electronically signed by Zoila Shutter MD Signature Date/Time: 06/11/2023/11:10:17 AM    Final     Assessment: Scrotal cellulitis, improving  Plan: Continue  antibiotics as per infectious disease.  Will sign off.  If any issues arise, please recontact [place].   Modena Slater, MD Urology 06/12/2023, 10:22 AM

## 2023-06-12 NOTE — Plan of Care (Signed)
 ?  Problem: Clinical Measurements: ?Goal: Will remain free from infection ?Outcome: Progressing ?  ?

## 2023-06-12 NOTE — Progress Notes (Signed)
                                                  Against Medical Advice Patient at this time expresses desire to leave the Hospital immediately, patient has been warned that this is not Medically advisable at this time, and can result in Medical complications like Death and Disability, patient understands and accepts the risks involved and assumes full responsibilty of this decision.  This patient has also been advised that if they feel the need for further medical assistance to return to any available ER or dial [phone].  Informed by Nursing staff that this patient has left care and has signed the form  Against Medical Advice on 06/12/2023 at 2348 hrs.  Chinita Greenland BSN MSNA MSN ACNPC-AG Acute Care Nurse Practitioner [facility]

## 2023-06-12 NOTE — Plan of Care (Signed)

## 2023-06-12 NOTE — Progress Notes (Signed)
 PROGRESS NOTE    [name]  [name]:[id number] DOB: 11-Feb-1996 DOA: 06/10/2023 PCP: Patient, No Pcp Per  Chief Complaint  Patient presents with   Testicle Pain    Brief Narrative:   ERMON [name] is Erik [name] 28 y.o. 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  Patient was admitted to the hospital with blood cultures obtained 2 out of 2 came back with MSSA.  Patient however left the hospital AGAINST MEDICAL ADVICE before treatment was completed.  He returned today to the ER after he was called and informed that he has bacteremia.  He denied any active fever or chills at the moment.  Denied nausea or vomiting.  Patient has been admitted with MSSA bacteremia and scrotal cellulitis.   Assessment & Plan:   Principal Problem:   Cellulitis of scrotum Active Problems:   Drug overdose   IVDU (intravenous drug user)   Cocaine abuse (HCC)   MSSA bacteremia  High risk to leave AMA.  For now he's willing to stay, we had prolonged discussion regarding the rational for his additional workup (need to r/o endocarditis with TEE). if he requests to leave AMA, after ensuring he understands risks, send with 14 days linezolid and outpatient follow up.   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  Severe TR.  Planning for TEE, hopefully early this week, discussed with cardiology ID consult, appreciate recs Appreciate urology assistance - recommending  repeat imaging for exam change  Polysubstance Abuse On daily methadone - continue, doing well with this Uses fentanyl and cocaine occasionally Encourage abstinence Hep C ab, hep b viral load pending     DVT prophylaxis: SCD Code Status: full Family Communication: none Disposition:   Status is: Inpatient Remains inpatient appropriate because: need for ID c/s, urology - workup for bacteremia   Consultants:  ID urology  Procedures:  IMPRESSIONS     1. Left ventricular ejection fraction, by estimation, is 55 to 60%. Left  ventricular ejection fraction by 3D volume is 56 %. The left ventricle has  normal function. The left ventricle has no regional wall motion  abnormalities. Left ventricular diastolic   parameters were normal.   2. Right ventricular systolic function is normal. The right ventricular  size is moderately enlarged. There is normal pulmonary artery systolic  pressure. The estimated right ventricular systolic pressure is 28.2 mmHg.   3. Right atrial size was severely dilated.   4. The mitral valve is normal in structure. No evidence of mitral valve  regurgitation. No evidence of mitral stenosis.   5. No large vegetation is noted, although there appears to be severe  prolapse of the posterior leaflet with anteriorly directed tricuspid  regurgitation. The tricuspid valve is abnormal. Tricuspid valve  regurgitation is severe.   6. The aortic valve is grossly normal. Aortic valve regurgitation is not  visualized. No aortic stenosis is present.   7. The inferior vena cava is normal in size with greater than 50%  respiratory variability, suggesting right atrial pressure of 3 mmHg.   Conclusion(s)/Recommendation(s): No evidence  of valvular vegetations on  this transthoracic echocardiogram. Consider Saxon Crosby transesophageal  echocardiogram to exclude infective endocarditis if clinically indicated.   Antimicrobials:  Anti-infectives (From admission, onward)    Start      Dose/Rate Route Frequency Ordered Stop   06/10/23 2100  ceFAZolin (ANCEF) IVPB 2g/100 mL premix        2 g 200 mL/hr over 30 Minutes Intravenous Every 8 hours 06/10/23 2054         Subjective: Reports subjective improvement Doesn't understand why he needs TEE (we discussed this at length)  Objective: Vitals:   06/11/23 0606 06/11/23 1328 06/11/23 2112 06/12/23 0512  BP: 104/64 121/75 119/74 118/78  Pulse: (!) 57 61 (!) 56 (!) 54  Resp: 19 17 18 18   Temp: 97.6 F (36.4 C) 97.8 F (36.6 C) 98.6 F (37 C) 98.5 F (36.9 C)  TempSrc: Oral     SpO2: 99% 98% 100% 99%  Weight:      Height:        Intake/Output Summary (Last 24 hours) at 06/12/2023 1018 Last data filed at 06/12/2023 0953 Gross per 24 hour  Intake 2992.27 ml  Output 950 ml  Net 2042.27 ml   Filed Weights   06/10/23 1837  Weight: 63.5 kg    Examination:  General: No acute distress. Cardiovascular: RRR Lungs: unlabored Abdomen: Soft, nontender, nondistended GU exam deferred today as IV team present to get IV Neurological: Alert and oriented 3. Moves all extremities 4 with equal strength. Cranial nerves II through XII grossly intact. Extremities: No clubbing or cyanosis. No edema.    Data Reviewed: I have personally reviewed following labs and imaging studies  CBC: Recent Labs  Lab 06/08/23 2245 06/09/23 0440 06/10/23 2300 06/11/23 0844 06/12/23 0836  WBC 10.3 9.0 6.3 3.8* 4.4  NEUTROABS 7.8* 7.1 3.4  --  2.1  HGB 13.7 11.0* 11.7* 11.6* 11.7*  HCT 41.7 32.1* 36.4* 35.8* 37.1*  MCV 87.1 85.6 88.6 88.2 89.2  PLT 212 165 236 218 256    Basic Metabolic Panel: Recent Labs  Lab 06/08/23 2245 06/09/23 0440 06/10/23 2300 06/11/23 0844 06/12/23 0836  NA 131* 132* 141 137 137  K 3.6 3.4* 3.5 3.8 3.3*  CL 96* 101 105 105 103  CO2 24 24 26 27 26   GLUCOSE 101* 118* 90 102* 134*  BUN 13 10 11 10 9   CREATININE 0.89 0.66 0.67 0.68 0.69  CALCIUM 8.7* 8.1* 8.8* 8.8* 8.8*  MG  --   --   --   --   1.9  PHOS  --   --   --   --  3.5    GFR: Estimated Creatinine Clearance: 122.4 mL/min (by C-G formula based on SCr of 0.69 mg/dL).  Liver Function Tests: Recent Labs  Lab 06/08/23 2245 06/09/23 0440 06/11/23 0844 06/12/23 0836  AST 42* 32 26 29  ALT 34 27 26 25   ALKPHOS 60 49 48 50  BILITOT 1.1 0.8 0.2 0.3  PROT 8.4* 6.3* 6.7 7.6  ALBUMIN 3.7 2.7* 2.9* 3.1*    CBG: No results for input(s): "GLUCAP" in the last 168 hours.   Recent Results (from the past 240 hours)  Blood Culture (routine x 2)     Status: Abnormal   Collection Time: 06/08/23 10:45 PM   Specimen: BLOOD LEFT FOREARM  Result Value Ref Range Status   Specimen Description   Final    BLOOD LEFT FOREARM Performed at [facility], [address], [place],  [place] [address]    Special Requests   Final    BOTTLES DRAWN AEROBIC AND ANAEROBIC Blood Culture results may not be optimal due to an inadequate volume of blood received in culture bottles Performed at [facility], [address], [place], [place] [address]    Culture  Setup Time   Final    GRAM POSITIVE COCCI IN CLUSTERS IN BOTH AEROBIC AND ANAEROBIC BOTTLES CRITICAL RESULT CALLED TO, READ BACK BY AND VERIFIED WITH: [facility] RN Clelia Croft [phone] AT 0815 BY EC Performed at [facility], [address], [place], [place] [address]    Culture STAPHYLOCOCCUS AUREUS (Cashtyn Pouliot)  Final   Report Status 06/12/2023 FINAL  Final   Organism ID, Bacteria STAPHYLOCOCCUS AUREUS  Final      Susceptibility   Staphylococcus aureus - MIC*    CIPROFLOXACIN 4 RESISTANT Resistant     ERYTHROMYCIN >=8 RESISTANT Resistant     GENTAMICIN <=0.5 SENSITIVE Sensitive     OXACILLIN 0.5 SENSITIVE Sensitive     TETRACYCLINE <=1 SENSITIVE Sensitive     VANCOMYCIN <=0.5 SENSITIVE Sensitive     TRIMETH/SULFA >=320 RESISTANT Resistant     CLINDAMYCIN <=0.25 SENSITIVE Sensitive     RIFAMPIN <=0.5 SENSITIVE Sensitive     Inducible Clindamycin NEGATIVE Sensitive      LINEZOLID 2 SENSITIVE Sensitive     * STAPHYLOCOCCUS AUREUS  Blood Culture ID Panel (Reflexed)     Status: Abnormal   Collection Time: 06/08/23 10:45 PM  Result Value Ref Range Status   Enterococcus faecalis NOT DETECTED NOT DETECTED Final   Enterococcus Faecium NOT DETECTED NOT DETECTED Final   Listeria monocytogenes NOT DETECTED NOT DETECTED Final   Staphylococcus species DETECTED (Kincade Granberg) NOT DETECTED Final    Comment: CRITICAL RESULT CALLED TO, READ BACK BY AND VERIFIED WITH: [facility] RN Clelia Croft [phone] AT 0815 BY EC    Staphylococcus aureus (BCID) DETECTED (Reighn Kaplan) NOT DETECTED Final    Comment: CRITICAL RESULT CALLED TO, READ BACK BY AND VERIFIED WITH: [facility] RN Clelia Croft [phone] AT 0815 BY EC    Staphylococcus epidermidis NOT DETECTED NOT DETECTED Final   Staphylococcus lugdunensis NOT DETECTED NOT DETECTED Final   Streptococcus species NOT DETECTED NOT DETECTED Final   Streptococcus agalactiae NOT DETECTED NOT DETECTED Final   Streptococcus pneumoniae NOT DETECTED NOT DETECTED Final   Streptococcus pyogenes NOT DETECTED NOT DETECTED Final   Chase Arnall.calcoaceticus-baumannii NOT DETECTED NOT DETECTED Final   Bacteroides fragilis NOT DETECTED NOT DETECTED Final   Enterobacterales NOT DETECTED NOT DETECTED Final   Enterobacter cloacae complex NOT DETECTED NOT DETECTED Final   Escherichia coli NOT DETECTED NOT DETECTED Final   Klebsiella aerogenes NOT DETECTED NOT DETECTED Final   Klebsiella oxytoca NOT DETECTED NOT DETECTED Final   Klebsiella pneumoniae NOT DETECTED NOT DETECTED Final   Proteus species NOT DETECTED NOT DETECTED Final   Salmonella species NOT DETECTED NOT DETECTED Final   Serratia marcescens NOT DETECTED NOT DETECTED Final   Haemophilus influenzae NOT DETECTED NOT DETECTED Final   Neisseria meningitidis NOT DETECTED NOT DETECTED Final   Pseudomonas aeruginosa NOT DETECTED NOT DETECTED Final   Stenotrophomonas maltophilia NOT DETECTED NOT DETECTED Final   Candida albicans NOT DETECTED NOT  DETECTED Final   Candida auris NOT DETECTED NOT DETECTED Final   Candida glabrata NOT DETECTED NOT DETECTED Final   Candida krusei NOT DETECTED NOT DETECTED Final   Candida parapsilosis NOT DETECTED NOT DETECTED Final   Candida tropicalis NOT DETECTED NOT DETECTED Final   Cryptococcus  neoformans/gattii NOT DETECTED NOT DETECTED Final   Meth resistant mecA/C and MREJ NOT DETECTED NOT DETECTED Final    Comment: Performed at [facility], [address] [place], [place] [address]  Resp panel by RT-PCR (RSV, Flu Ailyne Pawley&B, Covid) Urine, Random     Status: None   Collection Time: 06/08/23 10:56 PM   Specimen: Urine, Random; Nasal Swab  Result Value Ref Range Status   SARS Coronavirus 2 by RT PCR NEGATIVE NEGATIVE Final    Comment: (NOTE) SARS-CoV-2 target nucleic acids are NOT DETECTED.  The SARS-CoV-2 RNA is generally detectable in upper respiratory specimens during the acute phase of infection. The lowest concentration of SARS-CoV-2 viral copies this assay can detect is 138 copies/mL. Seeley Southgate negative result does not preclude SARS-Cov-2 infection and should not be used as the sole basis for treatment or other patient management decisions. Trystin Terhune negative result may occur with  improper specimen collection/handling, submission of specimen other than nasopharyngeal swab, presence of viral mutation(s) within the areas targeted by this assay, and inadequate number of viral copies(<138 copies/mL). Antwain Caliendo negative result must be combined with clinical observations, patient history, and epidemiological information. The expected result is Negative.  Fact Sheet for Patients:  BloggerCourse.com  Fact Sheet for Healthcare Providers:  SeriousBroker.it  This test is no t yet approved or cleared by the [place] FDA and  has been authorized for detection and/or diagnosis of SARS-CoV-2 by FDA under an Emergency Use Authorization (EUA). This EUA will remain   in effect (meaning this test can be used) for the duration of the COVID-19 declaration under Section 564(b)(1) of the Act, 21 U.S.C.section 360bbb-3(b)(1), unless the authorization is terminated  or revoked sooner.       Influenza Indi [place] by PCR NEGATIVE NEGATIVE Final   Influenza B by PCR NEGATIVE NEGATIVE Final    Comment: (NOTE) The Xpert Xpress SARS-CoV-2/FLU/RSV plus assay is intended as an aid in the diagnosis of influenza from Nasopharyngeal swab specimens and should not be used as Jimesha Rising sole basis for treatment. Nasal washings and aspirates are unacceptable for Xpert Xpress SARS-CoV-2/FLU/RSV testing.  Fact Sheet for Patients: BloggerCourse.com  Fact Sheet for Healthcare Providers: SeriousBroker.it  This test is not yet approved or cleared by the [place] FDA and has been authorized for detection and/or diagnosis of SARS-CoV-2 by FDA under an Emergency Use Authorization (EUA). This EUA will remain in effect (meaning this test can be used) for the duration of the COVID-19 declaration under Section 564(b)(1) of the Act, 21 U.S.C. section 360bbb-3(b)(1), unless the authorization is terminated or revoked.     Resp Syncytial Virus by PCR NEGATIVE NEGATIVE Final    Comment: (NOTE) Fact Sheet for Patients: BloggerCourse.com  Fact Sheet for Healthcare Providers: SeriousBroker.it  This test is not yet approved or cleared by the [place] FDA and has been authorized for detection and/or diagnosis of SARS-CoV-2 by FDA under an Emergency Use Authorization (EUA). This EUA will remain in effect (meaning this test can be used) for the duration of the COVID-19 declaration under Section 564(b)(1) of the Act, 21 U.S.C. section 360bbb-3(b)(1), unless the authorization is terminated or revoked.  Performed at [facility], [address] [place], [place]  [address]   Blood Culture (routine x 2)     Status: None (Preliminary result)   Collection Time: 06/08/23 11:15 PM   Specimen: BLOOD  Result Value Ref Range Status   Specimen Description   Final    BLOOD LEFT ANTECUBITAL Performed  at [facility], [address] [place], [place] [address]    Special Requests   Final    BOTTLES DRAWN AEROBIC AND ANAEROBIC Blood Culture adequate volume Performed at [facility], [address], [place], [place] [address]    Culture   Final    NO GROWTH 3 DAYS Performed at [facility], [address] [place], [place] [address]    Report Status PENDING  Incomplete  MRSA Next Gen by PCR, Nasal     Status: None   Collection Time: 06/09/23 10:41 AM   Specimen: Nasal Mucosa; Nasal Swab  Result Value Ref Range Status   MRSA by PCR Next Gen NOT DETECTED NOT DETECTED Final    Comment: (NOTE) The GeneXpert MRSA Assay (FDA approved for NASAL specimens only), is one component of Jearlean Demauro comprehensive MRSA colonization surveillance program. It is not intended to diagnose MRSA infection nor to guide or monitor treatment for MRSA infections. Test performance is not FDA approved in patients less than 74 years old. Performed at [facility], [address] [place], [place] [address]   Blood culture (routine x 2)     Status: None (Preliminary result)   Collection Time: 06/10/23 11:00 PM   Specimen: BLOOD LEFT ARM  Result Value Ref Range Status   Specimen Description   Final    BLOOD LEFT ARM Performed at [facility], [address], [place], [place] [address]    Special Requests   Final    BOTTLES DRAWN AEROBIC AND ANAEROBIC Blood Culture results may not be optimal due to an inadequate volume of blood received in culture bottles Performed at [facility], [address], [place], [place] [address]    Culture   Final    NO GROWTH 1 DAY Performed at [facility],  [address] [place], [place] [address]    Report Status PENDING  Incomplete  Culture, blood (Routine X 2) w Reflex to ID Panel     Status: None (Preliminary result)   Collection Time: 06/11/23  8:44 AM   Specimen: BLOOD RIGHT ARM  Result Value Ref Range Status   Specimen Description   Final    BLOOD RIGHT ARM Performed at [facility], [address] [place], [place] [address]    Special Requests   Final    BOTTLES DRAWN AEROBIC AND ANAEROBIC Blood Culture results may not be optimal due to an inadequate volume of blood received in culture bottles Performed at [facility], [address] [place], [place] [address]    Culture   Final    NO GROWTH < 24 HOURS Performed at [facility], [address] [place], [place] [address]    Report Status PENDING  Incomplete         Radiology Studies: ECHOCARDIOGRAM COMPLETE Result Date: 06/11/2023    ECHOCARDIOGRAM REPORT   Patient Name:   Erik [name] Date of Exam: 06/11/2023 Medical Rec #:  [id number]       Height:       66.0 in Accession #:    [id number]      Weight:       140.0 lb Date of Birth:  December 19, 1995       BSA:          1.719 m Patient Age:    27 years        BP:  104/64 mmHg Patient Gender: M               HR:           62 bpm. Exam Location:  Inpatient Procedure: 2D Echo, 3D Echo, Cardiac Doppler and Color Doppler (Both Spectral            and Color Flow Doppler were utilized during procedure). Indications:    Endocarditis  History:        Patient has prior history of Echocardiogram examinations, most                 recent 12/14/2015. Previous Myocardial Infarction. Polysubstance                 abuse. Tricuspid endocarditis.  Sonographer:    Sheralyn Boatman RDCS Referring Phys: [id number] MOHAMMAD L GARBA IMPRESSIONS  1. Left ventricular ejection fraction, by estimation, is 55 to 60%. Left ventricular ejection fraction by 3D volume is 56 %. The left ventricle has normal function. The left ventricle has no  regional wall motion abnormalities. Left ventricular diastolic  parameters were normal.  2. Right ventricular systolic function is normal. The right ventricular size is moderately enlarged. There is normal pulmonary artery systolic pressure. The estimated right ventricular systolic pressure is 28.2 mmHg.  3. Right atrial size was severely dilated.  4. The mitral valve is normal in structure. No evidence of mitral valve regurgitation. No evidence of mitral stenosis.  5. No large vegetation is noted, although there appears to be severe prolapse of the posterior leaflet with anteriorly directed tricuspid regurgitation. The tricuspid valve is abnormal. Tricuspid valve regurgitation is severe.  6. The aortic valve is grossly normal. Aortic valve regurgitation is not visualized. No aortic stenosis is present.  7. The inferior vena cava is normal in size with greater than 50% respiratory variability, suggesting right atrial pressure of 3 mmHg. Conclusion(s)/Recommendation(s): No evidence of valvular vegetations on this transthoracic echocardiogram. Consider Andie Mungin transesophageal echocardiogram to exclude infective endocarditis if clinically indicated. FINDINGS  Left Ventricle: Left ventricular ejection fraction, by estimation, is 55 to 60%. Left ventricular ejection fraction by 3D volume is 56 %. The left ventricle has normal function. The left ventricle has no regional wall motion abnormalities. The left ventricular internal cavity size was normal in size. There is no left ventricular hypertrophy. Left ventricular diastolic parameters were normal. Right Ventricle: The right ventricular size is moderately enlarged. No increase in right ventricular wall thickness. Right ventricular systolic function is normal. There is normal pulmonary artery systolic pressure. The tricuspid regurgitant velocity is 2.51 m/s, and with an assumed right atrial pressure of 3 mmHg, the estimated right ventricular systolic pressure is 28.2 mmHg. Left  Atrium: Left atrial size was normal in size. Right Atrium: Right atrial size was severely dilated. Pericardium: There is no evidence of pericardial effusion. Mitral Valve: The mitral valve is normal in structure. No evidence of mitral valve regurgitation. No evidence of mitral valve stenosis. Tricuspid Valve: No large vegetation is noted, although there appears to be severe prolapse of the posterior leaflet with anteriorly directed tricuspid regurgitation. The tricuspid valve is abnormal. Tricuspid valve regurgitation is severe. No evidence of tricuspid stenosis. 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 The flow in the hepatic veins is reversed during ventricular systole. Aortic Valve: The aortic valve is grossly normal. Aortic valve regurgitation is not visualized. No aortic stenosis is present. Pulmonic Valve: The pulmonic valve was normal in structure. Pulmonic valve regurgitation is  trivial. No evidence of pulmonic stenosis. Aorta: The aortic root and ascending aorta are structurally normal, with no evidence of dilitation. Venous: The inferior vena cava is normal in size with greater than 50% respiratory variability, suggesting right atrial pressure of 3 mmHg. IAS/Shunts: No atrial level shunt detected by color flow Doppler. Additional Comments: 3D was performed not requiring image post processing on an independent workstation and was normal.  LEFT VENTRICLE PLAX 2D LVIDd:         4.50 cm         Diastology LVIDs:         3.10 cm         LV e' medial:    15.70 cm/s LV PW:         1.00 cm         LV E/e' medial:  5.7 LV IVS:        0.80 cm         LV e' lateral:   21.30 cm/s LVOT diam:     2.40 cm         LV E/e' lateral: 4.2 LV SV:         94 LV SV Index:   55 LVOT Area:     4.52 cm        3D Volume EF                                LV 3D EF:    Left                                             ventricul LV Volumes (MOD)                            ar LV vol d, MOD    97.3 ml                     ejection A2C:                                        fraction LV vol d, MOD    99.7 ml                    by 3D A4C:                                        volume is LV vol s, MOD    41.4 ml                    56 %. A2C: LV vol s, MOD    63.2 ml A4C:                           3D Volume EF: LV SV MOD A2C:   55.9 ml       3D EF:        56 % LV SV MOD A4C:   99.7 ml       LV EDV:  138 ml LV SV MOD BP:    49.1 ml       LV ESV:       61 ml                                LV SV:        77 ml RIGHT VENTRICLE             IVC RV S prime:     17.70 cm/s  IVC diam: 2.30 cm TAPSE (M-mode): 3.1 cm LEFT ATRIUM             Index        RIGHT ATRIUM           Index LA diam:        2.70 cm 1.57 cm/m   RA Area:     28.50 cm LA Vol (A2C):   34.0 ml 19.78 ml/m  RA Volume:   112.00 ml 65.17 ml/m LA Vol (A4C):   25.4 ml 14.78 ml/m LA Biplane Vol: 30.0 ml 17.46 ml/m  AORTIC VALVE LVOT Vmax:   101.00 cm/s LVOT Vmean:  63.900 cm/s LVOT VTI:    0.208 m  AORTA Ao Root diam: 3.20 cm Ao Asc diam:  2.60 cm MITRAL VALVE               TRICUSPID VALVE MV Area (PHT): 2.99 cm    TR Peak grad:   25.2 mmHg MV Decel Time: 254 msec    TR Vmax:        251.00 cm/s MV E velocity: 89.70 cm/s MV Gari Hartsell velocity: 61.30 cm/s  SHUNTS MV E/Falen Lehrmann ratio:  1.46        Systemic VTI:  0.21 m                            Systemic Diam: 2.40 cm Zoila Shutter MD Electronically signed by Zoila Shutter MD Signature Date/Time: 06/11/2023/11:10:17 AM    Final         Scheduled Meds:  methadone  90 mg Oral q AM   Continuous Infusions:   ceFAZolin (ANCEF) IV 2 g (06/12/23 0512)     LOS: 2 days    Time spent: over 30 min    Lacretia Nicks, MD [facility]   To contact the attending provider between 7A-7P or the covering provider during after hours 7P-7A, please log into the web site [email or web address] and access using universal [place] password for that web site. If you do not have the password, please call the hospital  operator.  06/12/2023, 10:18 AM

## 2023-06-13 NOTE — Discharge Summary (Addendum)
 Physician Discharge Summary  Erik [name] WUJ:[id number] DOB: 12/28/1995 DOA: 06/10/2023  PCP: Patient, No Pcp Per  Admit date: 06/10/2023 Discharge date: 06/13/2023    Recommendations for Outpatient Follow-up:  Left AMA, plan was for inpatient TEE (if he left AMA, plan was for linezolid x14 days, but he left suddenly overnight) - I called 4/7 to follow up and potentially prescribe abx, but was unable to reach him (no answering service set up).  Needs further antibiotics for his bacteremia and scrotal infection outpatient.    Discharge Diagnoses:  Principal Problem:   Cellulitis of scrotum Active Problems:   Drug overdose   IVDU (intravenous drug user)   Cocaine abuse (HCC)   MSSA bacteremia   Discharge Condition: left AMA  Diet recommendation: regular  Filed Weights   06/10/23 1837  Weight: 63.5 kg    History of present illness:   Erik [name] is Erik [name] 28 y.o. 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    He returned to the hospital due to positive blood cultures.  Plan was for TEE to r/o endocarditis, unfortunately, he left AMA 4/6 PM.   See prior notes for additional detaisl  Hospital Course:  Assessment and Plan:  See prior notes        Procedures: See prior notes   Consultations: See prior notes  Discharge Exam: Vitals:   06/12/23 1315 06/12/23 2058  BP: 122/83 117/72  Pulse: 66 (!) 57  Resp: 14 15  Temp: 98 F (36.7 C) 98.7 F (37.1 C)  SpO2: 100% 100%     Discharge Instructions    Allergies as of 06/12/2023       Reactions   Sulfa Antibiotics Swelling, Rash   Childhood allergic reaction        Medication List     ASK your doctor  about these medications    methadone 10 MG/ML solution Commonly known as: DOLOPHINE Take 90 mg by mouth in the morning.       Allergies  Allergen Reactions   Sulfa Antibiotics Swelling and Rash    Childhood allergic reaction      The results of significant diagnostics from this hospitalization (including imaging, microbiology, ancillary and laboratory) are listed below for reference.    Significant Diagnostic Studies: ECHOCARDIOGRAM COMPLETE Result Date: 06/11/2023    ECHOCARDIOGRAM REPORT   Patient Name:   Erik [name] Date of Exam: 06/11/2023 Medical Rec #:  [id number]       Height:       66.0 in Accession #:    [id number]      Weight:       140.0 lb Date of Birth:  27-May-1995       BSA:          1.719 m Patient Age:    27 years        BP:           104/64 mmHg Patient Gender: M               HR:           62 bpm. Exam Location:  Inpatient Procedure: 2D Echo, 3D Echo, Cardiac Doppler and Color Doppler (Both Spectral  and Color Flow Doppler were utilized during procedure). Indications:    Endocarditis  History:        Patient has prior history of Echocardiogram examinations, most                 recent 12/14/2015. Previous Myocardial Infarction. Polysubstance                 abuse. Tricuspid endocarditis.  Sonographer:    Erik [name] RDCS Referring Phys: [id number] MOHAMMAD L GARBA IMPRESSIONS  1. Left ventricular ejection fraction, by estimation, is 55 to 60%. Left ventricular ejection fraction by 3D volume is 56 %. The left ventricle has normal function. The left ventricle has no regional wall motion abnormalities. Left ventricular diastolic  parameters were normal.  2. Right ventricular systolic function is normal. The right ventricular size is moderately enlarged. There is normal pulmonary artery systolic pressure. The estimated right ventricular systolic pressure is 28.2 mmHg.  3. Right atrial size was severely dilated.  4. The mitral valve is normal in structure. No evidence of mitral  valve regurgitation. No evidence of mitral stenosis.  5. No large vegetation is noted, although there appears to be severe prolapse of the posterior leaflet with anteriorly directed tricuspid regurgitation. The tricuspid valve is abnormal. Tricuspid valve regurgitation is severe.  6. The aortic valve is grossly normal. Aortic valve regurgitation is not visualized. No aortic stenosis is present.  7. The inferior vena cava is normal in size with greater than 50% respiratory variability, suggesting right atrial pressure of 3 mmHg. Conclusion(s)/Recommendation(s): No evidence of valvular vegetations on this transthoracic echocardiogram. Consider Loc Feinstein transesophageal echocardiogram to exclude infective endocarditis if clinically indicated. FINDINGS  Left Ventricle: Left ventricular ejection fraction, by estimation, is 55 to 60%. Left ventricular ejection fraction by 3D volume is 56 %. The left ventricle has normal function. The left ventricle has no regional wall motion abnormalities. The left ventricular internal cavity size was normal in size. There is no left ventricular hypertrophy. Left ventricular diastolic parameters were normal. Right Ventricle: The right ventricular size is moderately enlarged. No increase in right ventricular wall thickness. Right ventricular systolic function is normal. There is normal pulmonary artery systolic pressure. The tricuspid regurgitant velocity is 2.51 m/s, and with an assumed right atrial pressure of 3 mmHg, the estimated right ventricular systolic pressure is 28.2 mmHg. Left Atrium: Left atrial size was normal in size. Right Atrium: Right atrial size was severely dilated. Pericardium: There is no evidence of pericardial effusion. Mitral Valve: The mitral valve is normal in structure. No evidence of mitral valve regurgitation. No evidence of mitral valve stenosis. Tricuspid Valve: No large vegetation is noted, although there appears to be severe prolapse of the posterior leaflet with  anteriorly directed tricuspid regurgitation. The tricuspid valve is abnormal. Tricuspid valve regurgitation is severe. No evidence of tricuspid stenosis. 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 The flow in the hepatic veins is reversed during ventricular systole. Aortic Valve: The aortic valve is grossly normal. Aortic valve regurgitation is not visualized. No aortic stenosis is present. Pulmonic Valve: The pulmonic valve was normal in structure. Pulmonic valve regurgitation is trivial. No evidence of pulmonic stenosis. Aorta: The aortic root and ascending aorta are structurally normal, with no evidence of dilitation. Venous: The inferior vena cava is normal in size with greater than 50% respiratory variability, suggesting right atrial pressure of 3 mmHg. IAS/Shunts: No atrial level shunt detected by color flow Doppler. Additional Comments: 3D was performed not  requiring image post processing on an independent workstation and was normal.  LEFT VENTRICLE PLAX 2D LVIDd:         4.50 cm         Diastology LVIDs:         3.10 cm         LV e' medial:    15.70 cm/s LV PW:         1.00 cm         LV E/e' medial:  5.7 LV IVS:        0.80 cm         LV e' lateral:   21.30 cm/s LVOT diam:     2.40 cm         LV E/e' lateral: 4.2 LV SV:         94 LV SV Index:   55 LVOT Area:     4.52 cm        3D Volume EF                                LV 3D EF:    Left                                             ventricul LV Volumes (MOD)                            ar LV vol d, MOD    97.3 ml                    ejection A2C:                                        fraction LV vol d, MOD    99.7 ml                    by 3D A4C:                                        volume is LV vol s, MOD    41.4 ml                    56 %. A2C: LV vol s, MOD    63.2 ml A4C:                           3D Volume EF: LV SV MOD A2C:   55.9 ml       3D EF:        56 % LV SV MOD A4C:   99.7 ml       LV EDV:       138 ml LV SV  MOD BP:    49.1 ml       LV ESV:       61 ml  LV SV:        77 ml RIGHT VENTRICLE             IVC RV S prime:     17.70 cm/s  IVC diam: 2.30 cm TAPSE (M-mode): 3.1 cm LEFT ATRIUM             Index        RIGHT ATRIUM           Index LA diam:        2.70 cm 1.57 cm/m   RA Area:     28.50 cm LA Vol (A2C):   34.0 ml 19.78 ml/m  RA Volume:   112.00 ml 65.17 ml/m LA Vol (A4C):   25.4 ml 14.78 ml/m LA Biplane Vol: 30.0 ml 17.46 ml/m  AORTIC VALVE LVOT Vmax:   101.00 cm/s LVOT Vmean:  63.900 cm/s LVOT VTI:    0.208 m  AORTA Ao Root diam: 3.20 cm Ao Asc diam:  2.60 cm MITRAL VALVE               TRICUSPID VALVE MV Area (PHT): 2.99 cm    TR Peak grad:   25.2 mmHg MV Decel Time: 254 msec    TR Vmax:        251.00 cm/s MV E velocity: 89.70 cm/s MV Kadance Mccuistion velocity: 61.30 cm/s  SHUNTS MV E/Erik [name] ratio:  1.46        Systemic VTI:  0.21 m                            Systemic Diam: 2.40 cm Zoila Shutter MD Electronically signed by Zoila Shutter MD Signature Date/Time: 06/11/2023/11:10:17 AM    Final    CT ABDOMEN PELVIS W CONTRAST Result Date: 06/09/2023 CLINICAL DATA:  Scrotal swelling and groin swelling EXAM: CT ABDOMEN AND PELVIS WITH CONTRAST TECHNIQUE: Multidetector CT imaging of the abdomen and pelvis was performed using the standard protocol following bolus administration of intravenous contrast. RADIATION DOSE REDUCTION: This exam was performed according to the departmental dose-optimization program which includes automated exposure control, adjustment of the mA and/or kV according to patient size and/or use of iterative reconstruction technique. CONTRAST:  OMNIPAQUE IOHEXOL 300 MG/ML  SOLN COMPARISON:  Scrotal ultrasound 06/08/2023 and CT 02/27/2014 FINDINGS: Lower chest: No acute abnormality. Hepatobiliary: No focal liver abnormality is seen. No gallstones, gallbladder wall thickening, or biliary dilatation. Pancreas: Unremarkable. Spleen: Unremarkable. Adrenals/Urinary Tract: Normal  adrenal glands. No urinary calculi or hydronephrosis. Unremarkable bladder. Stomach/Bowel: Normal caliber large and small bowel. No bowel wall thickening. Stomach is within normal limits. Appendix is normal. Vascular/Lymphatic: No significant vascular findings are present. Bilateral inguinal lymphadenopathy with the largest nodes measuring 1.6 cm on the left and 1.2 cm on the right. Reproductive: Prostate is unremarkable. Marked scrotal edema similar to same day ultrasound. No abscess or subcutaneous gas. Other: No free intraperitoneal fluid or air. Musculoskeletal: No acute fracture. IMPRESSION: 1. Marked scrotal edema similar to same day ultrasound. No abscess or subcutaneous gas. 2. Bilateral inguinal lymphadenopathy, likely reactive. Electronically Signed   By: Minerva Fester M.D.   On: 06/09/2023 00:01   US SCROTUM W/DOPPLER Result Date: 06/08/2023 CLINICAL DATA:  Scrotal swelling for 1 day EXAM: SCROTAL ULTRASOUND DOPPLER ULTRASOUND OF THE TESTICLES TECHNIQUE: Complete ultrasound examination of the testicles, epididymis, and other scrotal structures was performed. Color and spectral Doppler ultrasound were also utilized to evaluate blood flow to the testicles. COMPARISON:  None Available.  FINDINGS: Right testicle Measurements: 3.8 x 2.5 x 2.3 cm. No mass or microlithiasis visualized. Left testicle Measurements: 3.2 x 2.5 x 2.4 cm. No mass or microlithiasis visualized. Right epididymis:  Normal in size and appearance. Left epididymis:  Normal in size and appearance. Hydrocele:  None visualized. Varicocele:  None visualized. Pulsed Doppler interrogation of both testes demonstrates normal low resistance arterial and venous waveforms bilaterally. Other: Bilateral skin thickening measuring 2.6 cm on the right and 2.9 cm on the left. No abscess or organized fluid collection. IMPRESSION: Unremarkable appearance of the testicles.  No torsion. Marked scrotal skin thickening/edema may be due to volume status or  cellulitis. No abscess. Electronically Signed   By: Minerva Fester M.D.   On: 06/08/2023 22:53   DG Chest Port 1 View Result Date: 06/08/2023 CLINICAL DATA:  Questionable sepsis-evaluate for abnormality. Swelling in testicles. Contact with someone with cephalus. EXAM: PORTABLE CHEST 1 VIEW COMPARISON:  12/04/2015 FINDINGS: Stable cardiomediastinal silhouette. No focal consolidation, pleural effusion, or pneumothorax. No displaced rib fractures. IMPRESSION: No active disease. Electronically Signed   By: Minerva Fester M.D.   On: 06/08/2023 22:31    Microbiology: Recent Results (from the past 240 hours)  Blood Culture (routine x 2)     Status: Abnormal   Collection Time: 06/08/23 10:45 PM   Specimen: BLOOD LEFT FOREARM  Result Value Ref Range Status   Specimen Description   Final    BLOOD LEFT FOREARM Performed at [facility], [address], [place], [place] [address]    Special Requests   Final    BOTTLES DRAWN AEROBIC AND ANAEROBIC Blood Culture results may not be optimal due to an inadequate volume of blood received in culture bottles Performed at [facility], [address], [place], [place] [address]    Culture  Setup Time   Final    GRAM POSITIVE COCCI IN CLUSTERS IN BOTH AEROBIC AND ANAEROBIC BOTTLES CRITICAL RESULT CALLED TO, READ BACK BY AND VERIFIED WITH: [facility] RN Clelia Croft [id number] AT 0815 BY EC Performed at [facility], [address], [place], [place] [address]    Culture STAPHYLOCOCCUS AUREUS (Erik [name])  Final   Report Status 06/12/2023 FINAL  Final   Organism ID, Bacteria STAPHYLOCOCCUS AUREUS  Final      Susceptibility   Staphylococcus aureus - MIC*    CIPROFLOXACIN 4 RESISTANT Resistant     ERYTHROMYCIN >=8 RESISTANT Resistant     GENTAMICIN <=0.5 SENSITIVE Sensitive     OXACILLIN 0.5 SENSITIVE Sensitive     TETRACYCLINE <=1 SENSITIVE Sensitive     VANCOMYCIN <=0.5 SENSITIVE Sensitive     TRIMETH/SULFA >=320 RESISTANT Resistant      CLINDAMYCIN <=0.25 SENSITIVE Sensitive     RIFAMPIN <=0.5 SENSITIVE Sensitive     Inducible Clindamycin NEGATIVE Sensitive     LINEZOLID 2 SENSITIVE Sensitive     * STAPHYLOCOCCUS AUREUS  Blood Culture ID Panel (Reflexed)     Status: Abnormal   Collection Time: 06/08/23 10:45 PM  Result Value Ref Range Status   Enterococcus faecalis NOT DETECTED NOT DETECTED Final   Enterococcus Faecium NOT DETECTED NOT DETECTED Final   Listeria monocytogenes NOT DETECTED NOT DETECTED Final   Staphylococcus species DETECTED (Erik [name]) NOT DETECTED Final    Comment: CRITICAL RESULT CALLED TO, READ BACK BY AND VERIFIED WITH: [facility] RN Clelia Croft [id number] AT 0815 BY EC    Staphylococcus aureus (BCID) DETECTED (Erik [name]) NOT DETECTED Final    Comment: CRITICAL RESULT CALLED TO, READ  BACK BY AND VERIFIED WITH: [facility] RN Clelia Croft [id number] AT 0815 BY EC    Staphylococcus epidermidis NOT DETECTED NOT DETECTED Final   Staphylococcus lugdunensis NOT DETECTED NOT DETECTED Final   Streptococcus species NOT DETECTED NOT DETECTED Final   Streptococcus agalactiae NOT DETECTED NOT DETECTED Final   Streptococcus pneumoniae NOT DETECTED NOT DETECTED Final   Streptococcus pyogenes NOT DETECTED NOT DETECTED Final   Lary Eckardt.calcoaceticus-baumannii NOT DETECTED NOT DETECTED Final   Bacteroides fragilis NOT DETECTED NOT DETECTED Final   Enterobacterales NOT DETECTED NOT DETECTED Final   Enterobacter cloacae complex NOT DETECTED NOT DETECTED Final   Escherichia coli NOT DETECTED NOT DETECTED Final   Klebsiella aerogenes NOT DETECTED NOT DETECTED Final   Klebsiella oxytoca NOT DETECTED NOT DETECTED Final   Klebsiella pneumoniae NOT DETECTED NOT DETECTED Final   Proteus species NOT DETECTED NOT DETECTED Final   Salmonella species NOT DETECTED NOT DETECTED Final   Serratia marcescens NOT DETECTED NOT DETECTED Final   Haemophilus influenzae NOT DETECTED NOT DETECTED Final   Neisseria meningitidis NOT DETECTED NOT DETECTED Final   Pseudomonas aeruginosa NOT  DETECTED NOT DETECTED Final   Stenotrophomonas maltophilia NOT DETECTED NOT DETECTED Final   Candida albicans NOT DETECTED NOT DETECTED Final   Candida auris NOT DETECTED NOT DETECTED Final   Candida glabrata NOT DETECTED NOT DETECTED Final   Candida krusei NOT DETECTED NOT DETECTED Final   Candida parapsilosis NOT DETECTED NOT DETECTED Final   Candida tropicalis NOT DETECTED NOT DETECTED Final   Cryptococcus neoformans/gattii NOT DETECTED NOT DETECTED Final   Meth resistant mecA/C and MREJ NOT DETECTED NOT DETECTED Final    Comment: Performed at [facility], [address] [place], [place] [address]  Resp panel by RT-PCR (RSV, Flu Vivianna Piccini&B, Covid) Urine, Random     Status: None   Collection Time: 06/08/23 10:56 PM   Specimen: Urine, Random; Nasal Swab  Result Value Ref Range Status   SARS Coronavirus 2 by RT PCR NEGATIVE NEGATIVE Final    Comment: (NOTE) SARS-CoV-2 target nucleic acids are NOT DETECTED.  The SARS-CoV-2 RNA is generally detectable in upper respiratory specimens during the acute phase of infection. The lowest concentration of SARS-CoV-2 viral copies this assay can detect is 138 copies/mL. Erik [name] negative result does not preclude SARS-Cov-2 infection and should not be used as the sole basis for treatment or other patient management decisions. Erik [name] negative result may occur with  improper specimen collection/handling, submission of specimen other than nasopharyngeal swab, presence of viral mutation(s) within the areas targeted by this assay, and inadequate number of viral copies(<138 copies/mL). Erik [name] negative result must be combined with clinical observations, patient history, and epidemiological information. The expected result is Negative.  Fact Sheet for Patients:  BloggerCourse.com  Fact Sheet for Healthcare Providers:  SeriousBroker.it  This test is no t yet approved or cleared by the [place] FDA and  has been  authorized for detection and/or diagnosis of SARS-CoV-2 by FDA under an Emergency Use Authorization (EUA). This EUA will remain  in effect (meaning this test can be used) for the duration of the COVID-19 declaration under Section 564(b)(1) of the Act, 21 U.S.C.section 360bbb-3(b)(1), unless the authorization is terminated  or revoked sooner.       Influenza Nicolai Labonte by PCR NEGATIVE NEGATIVE Final   Influenza B by PCR NEGATIVE NEGATIVE Final    Comment: (NOTE) The Xpert Xpress SARS-CoV-2/FLU/RSV plus assay is intended as an aid in the diagnosis of influenza from Nasopharyngeal swab specimens and  should not be used as Erik [name] sole basis for treatment. Nasal washings and aspirates are unacceptable for Xpert Xpress SARS-CoV-2/FLU/RSV testing.  Fact Sheet for Patients: BloggerCourse.com  Fact Sheet for Healthcare Providers: SeriousBroker.it  This test is not yet approved or cleared by the [place] FDA and has been authorized for detection and/or diagnosis of SARS-CoV-2 by FDA under an Emergency Use Authorization (EUA). This EUA will remain in effect (meaning this test can be used) for the duration of the COVID-19 declaration under Section 564(b)(1) of the Act, 21 U.S.C. section 360bbb-3(b)(1), unless the authorization is terminated or revoked.     Resp Syncytial Virus by PCR NEGATIVE NEGATIVE Final    Comment: (NOTE) Fact Sheet for Patients: BloggerCourse.com  Fact Sheet for Healthcare Providers: SeriousBroker.it  This test is not yet approved or cleared by the [place] FDA and has been authorized for detection and/or diagnosis of SARS-CoV-2 by FDA under an Emergency Use Authorization (EUA). This EUA will remain in effect (meaning this test can be used) for the duration of the COVID-19 declaration under Section 564(b)(1) of the Act, 21 U.S.C. section 360bbb-3(b)(1), unless the  authorization is terminated or revoked.  Performed at [facility], [address] [place], [place] [address]   Blood Culture (routine x 2)     Status: None (Preliminary result)   Collection Time: 06/08/23 11:15 PM   Specimen: BLOOD  Result Value Ref Range Status   Specimen Description   Final    BLOOD LEFT ANTECUBITAL Performed at [facility], [address], [place], [place] [address]    Special Requests   Final    BOTTLES DRAWN AEROBIC AND ANAEROBIC Blood Culture adequate volume Performed at [facility], [address], [place], [place] [address]    Culture   Final    NO GROWTH 4 DAYS Performed at [facility], [address], [place], [place] [address]    Report Status PENDING  Incomplete  MRSA Next Gen by PCR, Nasal     Status: None   Collection Time: 06/09/23 10:41 AM   Specimen: Nasal Mucosa; Nasal Swab  Result Value Ref Range Status   MRSA by PCR Next Gen NOT DETECTED NOT DETECTED Final    Comment: (NOTE) The GeneXpert MRSA Assay (FDA approved for NASAL specimens only), is one component of Shervin Cypert comprehensive MRSA colonization surveillance program. It is not intended to diagnose MRSA infection nor to guide or monitor treatment for MRSA infections. Test performance is not FDA approved in patients less than 78 years old. Performed at [facility], [address] [place], [place] [address]   Blood culture (routine x 2)     Status: None (Preliminary result)   Collection Time: 06/10/23 11:00 PM   Specimen: BLOOD LEFT ARM  Result Value Ref Range Status   Specimen Description   Final    BLOOD LEFT ARM Performed at [facility], [address] [place], [place] [address]    Special Requests   Final    BOTTLES DRAWN AEROBIC AND ANAEROBIC Blood Culture results may not be optimal due to an inadequate volume of blood received in culture bottles Performed at [facility], [address] [place], [place] [address]    Culture   Final    NO GROWTH 2 DAYS Performed at [facility], [address], [place], [place] [address]    Report Status PENDING  Incomplete  Culture, blood (Routine  X 2) w Reflex to ID Panel     Status: None (Preliminary result)   Collection Time: 06/11/23  8:44 AM   Specimen: BLOOD RIGHT ARM  Result Value Ref Range Status   Specimen Description   Final    BLOOD RIGHT ARM Performed at [facility], [address], [place], [place] [address]    Special Requests   Final    BOTTLES DRAWN AEROBIC AND ANAEROBIC Blood Culture results may not be optimal due to an inadequate volume of blood received in culture bottles Performed at [facility], [address], [place], [place] [address]    Culture   Final    NO GROWTH 2 DAYS Performed at [facility], [address], [place], [place] [address]    Report Status PENDING  Incomplete     Labs: Basic Metabolic Panel: Recent Labs  Lab 06/08/23 2245 06/09/23 0440 06/10/23 2300 06/11/23 0844 06/12/23 0836  NA 131* 132* 141 137 137  K 3.6 3.4* 3.5 3.8 3.3*  CL 96* 101 105 105 103  CO2 24 24 26 27 26   GLUCOSE 101* 118* 90 102* 134*  BUN 13 10 11 10 9   CREATININE 0.89 0.66 0.67 0.68 0.69  CALCIUM 8.7* 8.1* 8.8* 8.8* 8.8*  MG  --   --   --   --  1.9  PHOS  --   --   --   --  3.5   Liver Function Tests: Recent Labs  Lab 06/08/23 2245 06/09/23 0440 06/11/23 0844 06/12/23 0836  AST 42* 32 26 29  ALT 34 27 26 25   ALKPHOS 60 49 48 50  BILITOT 1.1 0.8 0.2 0.3  PROT 8.4* 6.3* 6.7 7.6  ALBUMIN 3.7 2.7* 2.9* 3.1*   No results for input(s): "LIPASE", "AMYLASE" in the last 168 hours. No results for input(s): "AMMONIA" in the last 168 hours. CBC: Recent Labs  Lab 06/08/23 2245 06/09/23 0440 06/10/23 2300 06/11/23 0844 06/12/23 0836  WBC 10.3 9.0 6.3 3.8* 4.4  NEUTROABS 7.8* 7.1 3.4  --  2.1  HGB 13.7 11.0* 11.7* 11.6* 11.7*  HCT  41.7 32.1* 36.4* 35.8* 37.1*  MCV 87.1 85.6 88.6 88.2 89.2  PLT 212 165 236 218 256   Cardiac Enzymes: No results for input(s): "CKTOTAL", "CKMB", "CKMBINDEX", "TROPONINI" in the last 168 hours. BNP: BNP (last 3 results) No results for input(s): "BNP" in the last 8760 hours.  ProBNP (last 3 results) No results for input(s): "PROBNP" in the last 8760 hours.  CBG: No results for input(s): "GLUCAP" in the last 168 hours.     Signed:  Lacretia Nicks MD.  [facility] 06/13/2023, 9:54 AM

## 2023-06-13 NOTE — Progress Notes (Signed)
 Patient left AMA. Educated about risk of complication not completing his treatment. Patient assumes the responsibility of his decision. Removed IV canula,apply dressing. And patient signed AMA form.

## 2023-06-14 LAB — CULTURE, BLOOD (ROUTINE X 2)
Culture: NO GROWTH
Special Requests: ADEQUATE

## 2023-06-14 LAB — HEPATITIS B DNA, ULTRAQUANTITATIVE, PCR
HBV DNA SERPL PCR-ACNC: 7630000 [IU]/mL
HBV DNA SERPL PCR-LOG IU: 6.883 {Log_IU}/mL

## 2023-06-16 LAB — CULTURE, BLOOD (ROUTINE X 2)
